# Patient Record
Sex: Female | Born: 1970 | Race: Black or African American | Hispanic: No | Marital: Single | Smoking: Never smoker
Health system: Southern US, Community
[De-identification: ages and names within clinical notes are randomized; demographics above are authoritative.]

## PROBLEM LIST (undated history)

## (undated) DIAGNOSIS — M5136 Other intervertebral disc degeneration, lumbar region: Secondary | ICD-10-CM

## (undated) DIAGNOSIS — C539 Malignant neoplasm of cervix uteri, unspecified: Secondary | ICD-10-CM

## (undated) DIAGNOSIS — F1910 Other psychoactive substance abuse, uncomplicated: Secondary | ICD-10-CM

## (undated) DIAGNOSIS — M549 Dorsalgia, unspecified: Secondary | ICD-10-CM

## (undated) DIAGNOSIS — I959 Hypotension, unspecified: Secondary | ICD-10-CM

## (undated) DIAGNOSIS — C801 Malignant (primary) neoplasm, unspecified: Secondary | ICD-10-CM

## (undated) DIAGNOSIS — M25562 Pain in left knee: Secondary | ICD-10-CM

## (undated) DIAGNOSIS — G8929 Other chronic pain: Secondary | ICD-10-CM

## (undated) DIAGNOSIS — M51369 Other intervertebral disc degeneration, lumbar region without mention of lumbar back pain or lower extremity pain: Secondary | ICD-10-CM

## (undated) DIAGNOSIS — F419 Anxiety disorder, unspecified: Secondary | ICD-10-CM

## (undated) DIAGNOSIS — N189 Chronic kidney disease, unspecified: Secondary | ICD-10-CM

## (undated) DIAGNOSIS — N183 Chronic kidney disease, stage 3 unspecified: Secondary | ICD-10-CM

## (undated) DIAGNOSIS — D649 Anemia, unspecified: Secondary | ICD-10-CM

## (undated) DIAGNOSIS — M5430 Sciatica, unspecified side: Secondary | ICD-10-CM

## (undated) DIAGNOSIS — G629 Polyneuropathy, unspecified: Secondary | ICD-10-CM

## (undated) DIAGNOSIS — R87629 Unspecified abnormal cytological findings in specimens from vagina: Secondary | ICD-10-CM

## (undated) DIAGNOSIS — G43909 Migraine, unspecified, not intractable, without status migrainosus: Secondary | ICD-10-CM

## (undated) DIAGNOSIS — K219 Gastro-esophageal reflux disease without esophagitis: Secondary | ICD-10-CM

## (undated) DIAGNOSIS — J45909 Unspecified asthma, uncomplicated: Secondary | ICD-10-CM

## (undated) HISTORY — DX: Other intervertebral disc degeneration, lumbar region: M51.36

## (undated) HISTORY — DX: Hypotension, unspecified: I95.9

## (undated) HISTORY — DX: Anemia, unspecified: D64.9

## (undated) HISTORY — DX: Unspecified abnormal cytological findings in specimens from vagina: R87.629

## (undated) HISTORY — PX: TUBAL LIGATION: SHX77

## (undated) HISTORY — DX: Other intervertebral disc degeneration, lumbar region without mention of lumbar back pain or lower extremity pain: M51.369

## (undated) HISTORY — DX: Chronic kidney disease, unspecified: N18.9

---

## 2004-05-15 ENCOUNTER — Emergency Department (HOSPITAL_COMMUNITY): Admission: EM | Admit: 2004-05-15 | Discharge: 2004-05-15 | Payer: Self-pay | Admitting: Emergency Medicine

## 2005-10-16 ENCOUNTER — Emergency Department (HOSPITAL_COMMUNITY): Admission: EM | Admit: 2005-10-16 | Discharge: 2005-10-16 | Payer: Self-pay | Admitting: Emergency Medicine

## 2006-07-23 ENCOUNTER — Emergency Department (HOSPITAL_COMMUNITY): Admission: EM | Admit: 2006-07-23 | Discharge: 2006-07-23 | Payer: Self-pay | Admitting: Emergency Medicine

## 2007-10-17 ENCOUNTER — Emergency Department (HOSPITAL_COMMUNITY): Admission: EM | Admit: 2007-10-17 | Discharge: 2007-10-18 | Payer: Self-pay | Admitting: Emergency Medicine

## 2008-02-20 ENCOUNTER — Emergency Department (HOSPITAL_COMMUNITY): Admission: EM | Admit: 2008-02-20 | Discharge: 2008-02-20 | Payer: Self-pay | Admitting: Emergency Medicine

## 2008-05-30 ENCOUNTER — Emergency Department (HOSPITAL_COMMUNITY): Admission: EM | Admit: 2008-05-30 | Discharge: 2008-05-30 | Payer: Self-pay | Admitting: Emergency Medicine

## 2009-04-17 ENCOUNTER — Emergency Department (HOSPITAL_COMMUNITY): Admission: EM | Admit: 2009-04-17 | Discharge: 2009-04-17 | Payer: Self-pay | Admitting: Emergency Medicine

## 2009-10-10 ENCOUNTER — Emergency Department (HOSPITAL_COMMUNITY): Admission: EM | Admit: 2009-10-10 | Discharge: 2009-10-10 | Payer: Self-pay | Admitting: Emergency Medicine

## 2009-10-11 ENCOUNTER — Emergency Department (HOSPITAL_COMMUNITY): Admission: EM | Admit: 2009-10-11 | Discharge: 2009-10-11 | Payer: Self-pay | Admitting: Emergency Medicine

## 2010-04-18 LAB — POCT I-STAT, CHEM 8
Chloride: 105 mEq/L (ref 96–112)
HCT: 36 % (ref 36.0–46.0)
Hemoglobin: 12.2 g/dL (ref 12.0–15.0)
Potassium: 3.4 mEq/L — ABNORMAL LOW (ref 3.5–5.1)
Sodium: 140 mEq/L (ref 135–145)

## 2010-05-20 LAB — URINALYSIS, ROUTINE W REFLEX MICROSCOPIC
Nitrite: NEGATIVE
Specific Gravity, Urine: 1.017 (ref 1.005–1.030)
Urobilinogen, UA: 0.2 mg/dL (ref 0.0–1.0)
pH: 6.5 (ref 5.0–8.0)

## 2010-05-20 LAB — DIFFERENTIAL
Basophils Absolute: 0 10*3/uL (ref 0.0–0.1)
Lymphocytes Relative: 14 % (ref 12–46)
Monocytes Absolute: 0.6 10*3/uL (ref 0.1–1.0)
Neutro Abs: 7 10*3/uL (ref 1.7–7.7)

## 2010-05-20 LAB — BASIC METABOLIC PANEL
CO2: 27 mEq/L (ref 19–32)
Calcium: 9.2 mg/dL (ref 8.4–10.5)
GFR calc Af Amer: >60 mL/min (ref >60)
GFR calc non Af Amer: >60 mL/min (ref >60)
Sodium: 137 mEq/L (ref 135–145)

## 2010-05-20 LAB — CBC
Hemoglobin: 12.6 g/dL (ref 12.0–15.0)
RDW: 14 % (ref 11.5–15.5)
WBC: 8.9 10*3/uL (ref 4.0–10.5)

## 2010-05-20 LAB — WET PREP, GENITAL: Yeast Wet Prep HPF POC: NONE SEEN

## 2010-07-25 ENCOUNTER — Emergency Department (HOSPITAL_COMMUNITY): Payer: Self-pay

## 2010-07-25 ENCOUNTER — Emergency Department (HOSPITAL_COMMUNITY)
Admission: EM | Admit: 2010-07-25 | Discharge: 2010-07-25 | Disposition: A | Discharge status: Home / Self Care | Payer: Self-pay | Attending: Emergency Medicine | Admitting: Emergency Medicine

## 2010-07-25 DIAGNOSIS — W2209XA Striking against other stationary object, initial encounter: Secondary | ICD-10-CM | POA: Insufficient documentation

## 2010-07-25 DIAGNOSIS — R51 Headache: Secondary | ICD-10-CM | POA: Insufficient documentation

## 2010-07-25 DIAGNOSIS — R112 Nausea with vomiting, unspecified: Secondary | ICD-10-CM | POA: Insufficient documentation

## 2010-07-25 DIAGNOSIS — Y9311 Activity, swimming: Secondary | ICD-10-CM | POA: Insufficient documentation

## 2010-07-25 DIAGNOSIS — R42 Dizziness and giddiness: Secondary | ICD-10-CM | POA: Insufficient documentation

## 2010-07-25 DIAGNOSIS — S060X9A Concussion with loss of consciousness of unspecified duration, initial encounter: Secondary | ICD-10-CM | POA: Insufficient documentation

## 2010-07-25 DIAGNOSIS — H538 Other visual disturbances: Secondary | ICD-10-CM | POA: Insufficient documentation

## 2010-07-25 DIAGNOSIS — M542 Cervicalgia: Secondary | ICD-10-CM | POA: Insufficient documentation

## 2010-08-30 ENCOUNTER — Emergency Department (HOSPITAL_COMMUNITY)
Admission: EM | Admit: 2010-08-30 | Discharge: 2010-08-30 | Disposition: A | Discharge status: Home / Self Care | Payer: Self-pay | Attending: Emergency Medicine | Admitting: Emergency Medicine

## 2010-08-30 DIAGNOSIS — M549 Dorsalgia, unspecified: Secondary | ICD-10-CM | POA: Insufficient documentation

## 2010-08-30 DIAGNOSIS — R252 Cramp and spasm: Secondary | ICD-10-CM | POA: Insufficient documentation

## 2010-08-30 DIAGNOSIS — IMO0001 Reserved for inherently not codable concepts without codable children: Secondary | ICD-10-CM | POA: Insufficient documentation

## 2010-08-30 LAB — BASIC METABOLIC PANEL
GFR calc Af Amer: >60 mL/min (ref >60)
GFR calc non Af Amer: 55 mL/min — ABNORMAL LOW (ref >60)
Glucose, Bld: 109 mg/dL — ABNORMAL HIGH (ref 70–99)
Potassium: 3.7 mEq/L (ref 3.5–5.1)
Sodium: 141 mEq/L (ref 135–145)

## 2010-08-30 LAB — CBC
HCT: 36.3 % (ref 36.0–46.0)
Hemoglobin: 11.7 g/dL — ABNORMAL LOW (ref 12.0–15.0)
MCH: 28.2 pg (ref 26.0–34.0)
MCHC: 32.2 g/dL (ref 30.0–36.0)
MCV: 87.5 fL (ref 78.0–100.0)
Platelets: 249 K/uL (ref 150–400)
RBC: 4.15 MIL/uL (ref 3.87–5.11)
RDW: 13.8 % (ref 11.5–15.5)
WBC: 6.2 K/uL (ref 4.0–10.5)

## 2010-08-30 LAB — D-DIMER, QUANTITATIVE: D-Dimer, Quant: 0.24 ug/mL-FEU (ref 0.00–0.48)

## 2010-08-30 MED ORDER — OXYCODONE-ACETAMINOPHEN 5-325 MG PO TABS: 2.0000 | ORAL_TABLET | ORAL | Status: AC | PRN

## 2010-08-30 MED ORDER — OXYCODONE-ACETAMINOPHEN 5-325 MG PO TABS
2.0000 | ORAL_TABLET | Freq: Once | ORAL | Status: AC
  Administered 2010-08-30: 2 via ORAL
  Filled 2010-08-30: qty 2

## 2010-08-30 NOTE — ED Notes (Signed)
Pt complain of pain and swelling in right leg x three days

## 2010-08-30 NOTE — ED Provider Notes (Signed)
History     Chief Complaint  Patient presents with  . Leg Pain   Patient is a 40 y.o. female presenting with leg pain. The history is provided by the patient. No language interpreter was used.  Leg Pain  Incident onset: 3 days ago. The incident occurred at home. There was no injury mechanism. Pain location: right posterior thigh. The quality of the pain is described as sharp. The pain is moderate. The pain has been constant since onset. Associated symptoms include muscle weakness. Pertinent negatives include no numbness, no loss of sensation and no tingling. She reports no foreign bodies present. The symptoms are aggravated by palpation. She has tried nothing for the symptoms.  C/o sharp pain to posterior aspect of right thigh with associated intermittent weakness of RLE onset 3 days ago and persistent since. States pain will intermittently radiate to right foot. Denies injury, fall, incontinence, dysuria. Reports h/o chronic back pain  Patient seen at 8:58AM  History reviewed. No pertinent past medical history.  History reviewed. No pertinent past surgical history.  History reviewed. No pertinent family history.  History  Substance Use Topics  . Smoking status: Never Smoker   . Smokeless tobacco: Not on file  . Alcohol Use: Yes    OB History    Grav Para Term Preterm Abortions TAB SAB Ect Mult Living                  Review of Systems  Constitutional: Negative for fever.  HENT: Negative for neck pain.   Gastrointestinal: Negative for nausea and vomiting.  Genitourinary: Negative for dysuria.       Negative for incontience  Musculoskeletal: Positive for myalgias and back pain.  Skin: Negative for rash.  Neurological: Negative for tingling and numbness.  All other systems reviewed and are negative.  All other systems negative except as noted in HPI.   Physical Exam  BP 120/68  Pulse 75  Temp(Src) 97.7 F (36.5 C) (Oral)  Resp 18  Ht 5\' 4"  (1.626 m)  Wt 234 lb  (106.142 kg)  BMI 40.17 kg/m2  SpO2 100%  LMP 08/04/2010  Physical Exam CONSTITUTIONAL: Well developed/well nourished HEAD AND FACE: Normocephalic/atraumatic EYES: EOMI/PERRL ENMT: Mucous membranes moist NECK: supple no meningeal signs SPINE:entire spine nontender CV: S1/S2 noted, no murmurs/rubs/gallops noted LUNGS: Lungs are clear to auscultation bilaterally, no apparent distress ABDOMEN: soft, nontender, no rebound or guarding NEURO: Pt is awake/alert, moves all extremitiesx4, no focal motor deficit noted in the LE EXTREMITIES: pulses normal, full ROM, DP and PT pulses intact, distal neurovascular intact, varicose veins to posterior right thigh without erythema or induration but with mild tenderness to palpation, mild swelling to right ankle but lower extremities otherwise appear symmetric, right calf mildly tender SKIN: warm, color normal PSYCH: no abnormalities of mood noted  ED Course  Procedures  MDM Nursing notes reviewed and considered in documentation All labs/vitals reviewed and considered - normal  Pt well appearing, no signs of DVT by labs and low risk.  No signs of cellulitis.  Admit back pain chronic, not new, no focal neuro deficits, she is able to ambulate Varicose veins do not appear secondarily infected   Chart written by Clarita Crane acting as scribe for Joya Gaskins, MD   I personally performed the services described in this documentation, which was scribed in my presence. The recorded information has been reviewed and considered. Joya Gaskins, MD     Joya Gaskins, MD 08/30/10 1028

## 2010-08-30 NOTE — ED Notes (Signed)
Complain of pain in right leg from varicose veins

## 2010-08-30 NOTE — ED Notes (Signed)
Pt states she feels better.

## 2010-08-30 NOTE — ED Notes (Signed)
Pt able to ambulate without assistance. States her leg does not hurt as much as it did but she still has some pain in the groin area

## 2010-10-25 ENCOUNTER — Emergency Department (HOSPITAL_COMMUNITY)
Admission: EM | Admit: 2010-10-25 | Discharge: 2010-10-25 | Disposition: A | Discharge status: Home / Self Care | Payer: Self-pay | Attending: Emergency Medicine | Admitting: Emergency Medicine

## 2010-10-25 ENCOUNTER — Emergency Department (HOSPITAL_COMMUNITY): Payer: Self-pay

## 2010-10-25 DIAGNOSIS — Y92009 Unspecified place in unspecified non-institutional (private) residence as the place of occurrence of the external cause: Secondary | ICD-10-CM | POA: Insufficient documentation

## 2010-10-25 DIAGNOSIS — W19XXXA Unspecified fall, initial encounter: Secondary | ICD-10-CM | POA: Insufficient documentation

## 2010-10-25 DIAGNOSIS — M79609 Pain in unspecified limb: Secondary | ICD-10-CM | POA: Insufficient documentation

## 2010-10-25 DIAGNOSIS — Y93K1 Activity, walking an animal: Secondary | ICD-10-CM | POA: Insufficient documentation

## 2010-10-25 DIAGNOSIS — S6390XA Sprain of unspecified part of unspecified wrist and hand, initial encounter: Secondary | ICD-10-CM | POA: Insufficient documentation

## 2011-03-11 ENCOUNTER — Encounter (HOSPITAL_COMMUNITY): Payer: Self-pay | Admitting: Family Medicine

## 2011-03-11 ENCOUNTER — Emergency Department (HOSPITAL_COMMUNITY)
Admission: EM | Admit: 2011-03-11 | Discharge: 2011-03-11 | Disposition: A | Discharge status: Home / Self Care | Payer: Self-pay | Attending: Emergency Medicine | Admitting: Emergency Medicine

## 2011-03-11 DIAGNOSIS — M5430 Sciatica, unspecified side: Secondary | ICD-10-CM | POA: Insufficient documentation

## 2011-03-11 DIAGNOSIS — M5380 Other specified dorsopathies, site unspecified: Secondary | ICD-10-CM | POA: Insufficient documentation

## 2011-03-11 DIAGNOSIS — M5432 Sciatica, left side: Secondary | ICD-10-CM

## 2011-03-11 DIAGNOSIS — M549 Dorsalgia, unspecified: Secondary | ICD-10-CM | POA: Insufficient documentation

## 2011-03-11 MED ORDER — DICLOFENAC SODIUM 75 MG PO TBEC: 75.0000 mg | DELAYED_RELEASE_TABLET | Freq: Two times a day (BID) | ORAL | Status: DC

## 2011-03-11 MED ORDER — HYDROCODONE-ACETAMINOPHEN 5-325 MG PO TABS
1.0000 | ORAL_TABLET | Freq: Four times a day (QID) | ORAL | Status: AC | PRN
Start: 2011-03-11 — End: 2011-03-21

## 2011-03-11 NOTE — ED Notes (Signed)
Pt reports having lower back pain x3 days. Denies any injury.

## 2011-03-11 NOTE — ED Provider Notes (Signed)
History     CSN: [id number]  Arrival date & time 03/11/11  4098   First MD Initiated Contact with Patient 03/11/11 0846      9:26 AM HPI Patient reports back pain that began gradually 3 days ago. States pain is located in the left lower back and radiates down left lateral thigh. Reports pain is worse with sitting and laying flat. Reports pain is better with standing upright. Reports no improvement with ibuprofen or muscle relaxers. Reports having to Vicodin left which helped the pain some. Denies known injury, numbness, tingling, weakness, Perineal numbness, saddle anesthesias, urinary symptoms, vomiting nausea, vomiting, fever  Patient is a 41 y.o. female presenting with back pain. The history is provided by the patient.  Back Pain  The current episode started more than 2 days ago. The problem occurs constantly. The problem has been gradually worsening. The pain is associated with no known injury. The pain is present in the lumbar spine. The quality of the pain is described as shooting. The pain radiates to the left thigh. The pain is moderate. The symptoms are aggravated by certain positions and bending. Pertinent negatives include no chest pain, no fever, no numbness, no headaches, no abdominal pain, no bowel incontinence, no perianal numbness, no bladder incontinence, no dysuria, no pelvic pain, no leg pain, no paresthesias, no paresis, no tingling and no weakness. She has tried NSAIDs, bed rest and muscle relaxants for the symptoms. The treatment provided no relief.    History reviewed. No pertinent past medical history.  History reviewed. No pertinent past surgical history.  History reviewed. No pertinent family history.  History  Substance Use Topics  . Smoking status: Never Smoker   . Smokeless tobacco: Not on file  . Alcohol Use: Yes    OB History    Grav Para Term Preterm Abortions TAB SAB Ect Mult Living                  Review of Systems  Constitutional: Negative for  fever and chills.  HENT: Negative for neck pain and neck stiffness.   Cardiovascular: Negative for chest pain.  Gastrointestinal: Negative for abdominal pain and bowel incontinence.  Genitourinary: Negative for bladder incontinence, dysuria, urgency, frequency, hematuria, flank pain and pelvic pain.  Musculoskeletal: Positive for back pain.       Denies saddle anesthesias, or perineal numbness, urinary or bowel incontinence  Neurological: Negative for tingling, weakness, numbness, headaches and paresthesias.    Allergies  Benadryl  Home Medications   Current Outpatient Rx  Name Route Sig Dispense Refill  . ACETAMINOPHEN 500 MG PO TABS Oral Take 1,000 mg by mouth every 6 (six) hours as needed. For pain    . HYDROCODONE-ACETAMINOPHEN 5-325 MG PO TABS Oral Take 1 tablet by mouth every 6 (six) hours as needed. For pain    . PRESCRIPTION MEDICATION Oral Take 1 tablet by mouth 4 (four) times daily as needed. Muscle relaxer for pain      BP 113/53  Pulse 83  Temp(Src) 98.4 F (36.9 C) (Oral)  Resp 18  SpO2 97%  LMP 02/24/2011  Physical Exam  Vitals reviewed. Constitutional: She is oriented to person, place, and time. Vital signs are normal. She appears well-developed and well-nourished.  HENT:  Head: Normocephalic and atraumatic.  Eyes: Conjunctivae are normal. Pupils are equal, round, and reactive to light.  Neck: Normal range of motion. Neck supple.  Cardiovascular: Normal rate, regular rhythm and normal heart sounds.  Exam reveals no friction rub.  No murmur heard. Pulmonary/Chest: Effort normal and breath sounds normal. She has no wheezes. She has no rhonchi. She has no rales. She exhibits no tenderness.  Abdominal: Soft. Bowel sounds are normal. She exhibits no distension and no mass. There is no tenderness. There is no rebound and no guarding.  Musculoskeletal: Normal range of motion.       Lumbar back: She exhibits tenderness, pain and spasm. She exhibits normal range of  motion, no bony tenderness, no swelling and normal pulse.       Back:  Neurological: She is alert and oriented to person, place, and time. Coordination normal.  Skin: Skin is warm and dry. No rash noted. No erythema. No pallor.    ED Course  Procedures  MDM  Patient has not had injury to lower back discussed treatment for sciatica type pain" improvement doctors conservative treatment to follow up with [facility] patient agrees with plan and is ready for discharge       Thomasene Lot, PA-C 03/11/11 1024

## 2011-03-11 NOTE — ED Provider Notes (Signed)
Medical screening examination/treatment/procedure(s) were performed by non-physician practitioner and as supervising physician I was immediately available for consultation/collaboration.   Lyanne Co, MD 03/11/11 1210

## 2011-06-12 ENCOUNTER — Emergency Department (HOSPITAL_COMMUNITY)
Admission: EM | Admit: 2011-06-12 | Discharge: 2011-06-12 | Disposition: A | Discharge status: Home / Self Care | Payer: Self-pay | Attending: Emergency Medicine | Admitting: Emergency Medicine

## 2011-06-12 ENCOUNTER — Encounter (HOSPITAL_COMMUNITY): Payer: Self-pay | Admitting: Emergency Medicine

## 2011-06-12 ENCOUNTER — Emergency Department (HOSPITAL_COMMUNITY): Payer: Self-pay

## 2011-06-12 DIAGNOSIS — S8000XA Contusion of unspecified knee, initial encounter: Secondary | ICD-10-CM | POA: Insufficient documentation

## 2011-06-12 DIAGNOSIS — W010XXA Fall on same level from slipping, tripping and stumbling without subsequent striking against object, initial encounter: Secondary | ICD-10-CM | POA: Insufficient documentation

## 2011-06-12 DIAGNOSIS — S8002XA Contusion of left knee, initial encounter: Secondary | ICD-10-CM

## 2011-06-12 MED ORDER — OXYCODONE-ACETAMINOPHEN 5-325 MG PO TABS: 1.0000 | ORAL_TABLET | ORAL | Status: AC | PRN

## 2011-06-12 MED ORDER — OXYCODONE-ACETAMINOPHEN 5-325 MG PO TABS
1.0000 | ORAL_TABLET | Freq: Once | ORAL | Status: AC
  Administered 2011-06-12: 1 via ORAL
  Filled 2011-06-12: qty 1

## 2011-06-12 NOTE — Discharge Instructions (Signed)
Contusion  A contusion is a deep bruise. Contusions happen when an injury causes bleeding under the skin. Signs of bruising include pain, puffiness (swelling), and discolored skin. The contusion may turn blue, purple, or yellow.  HOME CARE    Put ice on the injured area.   Put ice in a plastic bag.   Place a towel between your skin and the bag.   Leave the ice on for 15 to 20 minutes, 3 to 4 times a day.   Only take medicine as told by your doctor.   Rest the injured area.   If possible, raise (elevate) the injured area to lessen puffiness.  GET HELP RIGHT AWAY IF:    You have more bruising or puffiness.   You have pain that is getting worse.   Your puffiness or pain is not helped by medicine.  MAKE SURE YOU:    Understand these instructions.   Will watch your condition.   Will get help right away if you are not doing well or get worse.  Document Released: 07/09/2007 Document Revised: 01/09/2011 Document Reviewed: 11/25/2010  [employer] Patient Information 2012 [employer].

## 2011-06-12 NOTE — ED Notes (Signed)
Pt states she hit here knee x 2 weeks ago and had some swelling while on a car trip to [place]. Pt states she then tripped and fell last night and is having left knee pain and "pins and needles" feeling in left lower leg.
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
## 2011-08-22 ENCOUNTER — Emergency Department (HOSPITAL_COMMUNITY): Payer: Self-pay

## 2011-08-22 ENCOUNTER — Emergency Department (HOSPITAL_COMMUNITY)
Admission: EM | Admit: 2011-08-22 | Discharge: 2011-08-22 | Disposition: A | Discharge status: Home / Self Care | Payer: Self-pay | Attending: Emergency Medicine | Admitting: Emergency Medicine

## 2011-08-22 ENCOUNTER — Encounter (HOSPITAL_COMMUNITY): Payer: Self-pay | Admitting: *Deleted

## 2011-08-22 DIAGNOSIS — S8390XA Sprain of unspecified site of unspecified knee, initial encounter: Secondary | ICD-10-CM

## 2011-08-22 DIAGNOSIS — IMO0002 Reserved for concepts with insufficient information to code with codable children: Secondary | ICD-10-CM | POA: Insufficient documentation

## 2011-08-22 DIAGNOSIS — Y9229 Other specified public building as the place of occurrence of the external cause: Secondary | ICD-10-CM | POA: Insufficient documentation

## 2011-08-22 DIAGNOSIS — W010XXA Fall on same level from slipping, tripping and stumbling without subsequent striking against object, initial encounter: Secondary | ICD-10-CM | POA: Insufficient documentation

## 2011-08-22 MED ORDER — OXYCODONE-ACETAMINOPHEN 5-325 MG PO TABS
2.0000 | ORAL_TABLET | Freq: Once | ORAL | Status: AC
  Administered 2011-08-22: 2 via ORAL
  Filled 2011-08-22: qty 2

## 2011-08-22 MED ORDER — IBUPROFEN 800 MG PO TABS: 800.0000 mg | ORAL_TABLET | Freq: Three times a day (TID) | ORAL | Status: AC | PRN

## 2011-08-22 MED ORDER — OXYCODONE-ACETAMINOPHEN 5-325 MG PO TABS: 2.0000 | ORAL_TABLET | ORAL | Status: AC | PRN

## 2011-08-22 MED ORDER — IBUPROFEN 800 MG PO TABS
800.0000 mg | ORAL_TABLET | Freq: Once | ORAL | Status: AC
  Administered 2011-08-22: 800 mg via ORAL
  Filled 2011-08-22: qty 1

## 2011-08-22 NOTE — ED Notes (Signed)
PA  In room to evaluate

## 2011-08-22 NOTE — ED Provider Notes (Signed)
History     CSN: [id number]  Arrival date & time 08/22/11  1106   First MD Initiated Contact with Patient 08/22/11 1114      Chief Complaint  Patient presents with  . Knee Pain    (Consider location/radiation/quality/duration/timing/severity/associated sxs/prior treatment) HPI Comments: Kristi Martin tripped prior to arrival on a rolled at a local gas station landing on her right knee and has had increased pain with movement and weightbearing since the event.  She denies other injury including head injury or LOC.  Pain is constant and sharp and worse with palpation and range of motion.  She is currently applying ice which has reviewed her pain minimally.  She denies any previous problems with her left knee.  She has been unable to weight-bear since the event.  The history is provided by the patient.    History reviewed. No pertinent past medical history.  History reviewed. No pertinent past surgical history.  History reviewed. No pertinent family history.  History  Substance Use Topics  . Smoking status: Never Smoker   . Smokeless tobacco: Not on file  . Alcohol Use: Yes    OB History    Grav Para Term Preterm Abortions TAB SAB Ect Mult Living                  Review of Systems  Musculoskeletal: Positive for arthralgias. Negative for joint swelling.  Skin: Negative for wound.  Neurological: Negative for weakness and numbness.    Allergies  Benadryl  Home Medications   Current Outpatient Rx  Name Route Sig Dispense Refill  . IBUPROFEN 800 MG PO TABS Oral Take 1 tablet (800 mg total) by mouth every 8 (eight) hours as needed for pain. 15 tablet 0  . OXYCODONE-ACETAMINOPHEN 5-325 MG PO TABS Oral Take 2 tablets by mouth every 4 (four) hours as needed for pain. 20 tablet 0    BP 102/67  Pulse 102  Temp 98.2 F (36.8 C) (Oral)  Resp 20  Ht 5\' 4"  (1.626 m)  Wt 274 lb (124.286 kg)  BMI 47.03 kg/m2  SpO2 100%  LMP 08/09/2011  Physical Exam  Constitutional:  She appears well-developed and well-nourished.  HENT:  Head: Atraumatic.  Neck: Normal range of motion.  Cardiovascular:       Pulses equal bilaterally  Musculoskeletal: She exhibits tenderness. She exhibits no edema.       Left knee: tenderness found.       TTP let superior patellar edge,  No patellar tendon deformity. Pt can SLR keeping knee in extension,  But painful.  Neurological: She is alert. She has normal strength. She displays normal reflexes. No sensory deficit.       Equal strength  Skin: Skin is warm and dry.  Psychiatric: She has a normal mood and affect.    ED Course  Procedures (including critical care time)  Labs Reviewed - No data to display Dg Knee Complete 4 Views Left  08/22/2011  *RADIOLOGY REPORT*  Clinical Data: Fall, knee pain.  LEFT KNEE - COMPLETE 4+ VIEW  Comparison: 06/12/2011  Findings: No acute bony abnormality.  Specifically, no fracture, subluxation, or dislocation.  Soft tissues are intact.  No joint effusion.  IMPRESSION: No acute bony abnormality.  Original Report Authenticated By: Cyndie Chime, M.D.     1. Knee sprain       MDM  Oxycodone,  Ibuprofen,  RICE,  Ace wrap.  Referral Dr Romeo Apple if not improved over the next week.  Burgess Amor, PA 08/22/11 1254

## 2011-08-22 NOTE — ED Notes (Signed)
States she fell in a store and injured her left knee, states knee is painful to move

## 2011-08-22 NOTE — ED Notes (Signed)
Lt knee pain, tripped over rug and struck knee against counter , and floor

## 2011-08-26 NOTE — ED Provider Notes (Signed)
Medical screening examination/treatment/procedure(s) were performed by non-physician practitioner and as supervising physician I was immediately available for consultation/collaboration.   Shelda Jakes, MD 08/26/11 2155

## 2011-09-03 ENCOUNTER — Emergency Department (HOSPITAL_COMMUNITY)
Admission: EM | Admit: 2011-09-03 | Discharge: 2011-09-03 | Disposition: A | Discharge status: Home / Self Care | Payer: Self-pay | Attending: Emergency Medicine | Admitting: Emergency Medicine

## 2011-09-03 ENCOUNTER — Encounter (HOSPITAL_COMMUNITY): Payer: Self-pay | Admitting: Emergency Medicine

## 2011-09-03 DIAGNOSIS — M25562 Pain in left knee: Secondary | ICD-10-CM

## 2011-09-03 DIAGNOSIS — M25569 Pain in unspecified knee: Secondary | ICD-10-CM | POA: Insufficient documentation

## 2011-09-03 MED ORDER — ONDANSETRON HCL 4 MG PO TABS
4.0000 mg | ORAL_TABLET | Freq: Once | ORAL | Status: AC
  Administered 2011-09-03: 4 mg via ORAL
  Filled 2011-09-03: qty 1

## 2011-09-03 MED ORDER — ACETAMINOPHEN-CODEINE #3 300-30 MG PO TABS: 1.0000 | ORAL_TABLET | Freq: Four times a day (QID) | ORAL | Status: AC | PRN

## 2011-09-03 MED ORDER — DEXAMETHASONE 4 MG PO TABS: ORAL_TABLET | ORAL | Status: DC

## 2011-09-03 MED ORDER — MORPHINE SULFATE 4 MG/ML IJ SOLN: 8.0000 mg | Freq: Once | INTRAMUSCULAR | Status: DC

## 2011-09-03 MED ORDER — DEXAMETHASONE SODIUM PHOSPHATE 4 MG/ML IJ SOLN
8.0000 mg | Freq: Once | INTRAMUSCULAR | Status: AC
  Administered 2011-09-03: 8 mg via INTRAMUSCULAR
  Filled 2011-09-03: qty 2

## 2011-09-03 MED ORDER — MORPHINE SULFATE 2 MG/ML IJ SOLN
8.0000 mg | Freq: Once | INTRAMUSCULAR | Status: AC
  Administered 2011-09-03: 8 mg via INTRAMUSCULAR
  Filled 2011-09-03: qty 4

## 2011-09-03 NOTE — ED Notes (Signed)
Pt here on July 19 for l knee sprain. Ran out of rx meds x 2 days ago. Unable to go to ortho due to finances. Pain rating 9. Knee brace on and pt has crutches with her.

## 2011-09-03 NOTE — ED Provider Notes (Signed)
History     CSN: [id number]  Arrival date & time 09/03/11  8413   First MD Initiated Contact with Patient 09/03/11 [phone]      Chief Complaint  Patient presents with  . Knee Pain    (Consider location/radiation/quality/duration/timing/severity/associated sxs/prior treatment) Patient is a 41 y.o. female presenting with knee pain. The history is provided by the patient.  Knee Pain This is a recurrent problem. The current episode started in the past 7 days. The problem occurs constantly. The problem has been gradually worsening. Associated symptoms include arthralgias and joint swelling. Pertinent negatives include no abdominal pain, chest pain, coughing or neck pain. The symptoms are aggravated by standing and walking. She has tried nothing for the symptoms. The treatment provided no relief.    History reviewed. No pertinent past medical history.  History reviewed. No pertinent past surgical history.  History reviewed. No pertinent family history.  History  Substance Use Topics  . Smoking status: Never Smoker   . Smokeless tobacco: Not on file  . Alcohol Use: Yes    OB History    Grav Para Term Preterm Abortions TAB SAB Ect Mult Living                  Review of Systems  Constitutional: Negative for activity change.       All ROS Neg except as noted in HPI  HENT: Negative for nosebleeds and neck pain.   Eyes: Negative for photophobia and discharge.  Respiratory: Negative for cough, shortness of breath and wheezing.   Cardiovascular: Negative for chest pain and palpitations.  Gastrointestinal: Negative for abdominal pain and blood in stool.  Genitourinary: Negative for dysuria, frequency and hematuria.  Musculoskeletal: Positive for joint swelling and arthralgias. Negative for back pain.  Skin: Negative.   Neurological: Negative for dizziness, seizures and speech difficulty.  Psychiatric/Behavioral: Negative for hallucinations and confusion.    Allergies   Benadryl  Home Medications  No current outpatient prescriptions on file.  BP 145/97  Pulse 97  Temp 98.1 F (36.7 C) (Oral)  Resp 18  SpO2 95%  LMP 08/09/2011  Physical Exam  Nursing note and vitals reviewed. Constitutional: She is oriented to person, place, and time. She appears well-developed and well-nourished.  Non-toxic appearance.  HENT:  Head: Normocephalic.  Right Ear: Tympanic membrane and external ear normal.  Left Ear: Tympanic membrane and external ear normal.  Eyes: EOM and lids are normal. Pupils are equal, round, and reactive to light.  Neck: Normal range of motion. Neck supple. Carotid bruit is not present.  Cardiovascular: Normal rate, regular rhythm, normal heart sounds, intact distal pulses and normal pulses.   Pulmonary/Chest: Breath sounds normal. No respiratory distress.  Abdominal: Soft. Bowel sounds are normal. There is no tenderness. There is no guarding.  Musculoskeletal: Normal range of motion.       Pain to medial and posterior palpation. Pt will not allow any manipulation of the knee due to pain. Patella mid line.  Achilles intact. Distal pulses wnl. Neg Homan's sign.  Lymphadenopathy:       Head (right side): No submandibular adenopathy present.       Head (left side): No submandibular adenopathy present.    She has no cervical adenopathy.  Neurological: She is alert and oriented to person, place, and time. She has normal strength. No cranial nerve deficit or sensory deficit.  Skin: Skin is warm and dry.  Psychiatric: She has a normal mood and affect. Her speech is normal.  ED Course  Procedures (including critical care time)  Labs Reviewed - No data to display No results found.   No diagnosis found.    MDM  I have reviewed nursing notes, vital signs, and all appropriate lab and imaging results for this patient. Pt has been referred to orthopedic for her knee injury, but has not seen specialist due to financial issues. No acute  changes on limited exam today. Pt strongly encouraged to see the specialist as soon as possible. She will continue to use ace wrap and knee brace, and crutches. Rx for Tylenol# 3 (15 tabs given)       Kathie Dike, PA 09/20/11 [phone]

## 2011-09-21 NOTE — ED Provider Notes (Signed)
Medical screening examination/treatment/procedure(s) were performed by non-physician practitioner and as supervising physician I was immediately available for consultation/collaboration.   Joya Gaskins, MD 09/21/11 2232

## 2011-10-03 ENCOUNTER — Emergency Department (HOSPITAL_COMMUNITY)
Admission: EM | Admit: 2011-10-03 | Discharge: 2011-10-03 | Disposition: A | Discharge status: Home / Self Care | Payer: Self-pay | Attending: Emergency Medicine | Admitting: Emergency Medicine

## 2011-10-03 ENCOUNTER — Encounter (HOSPITAL_COMMUNITY): Payer: Self-pay | Admitting: *Deleted

## 2011-10-03 DIAGNOSIS — R0789 Other chest pain: Secondary | ICD-10-CM | POA: Insufficient documentation

## 2011-10-03 DIAGNOSIS — G43909 Migraine, unspecified, not intractable, without status migrainosus: Secondary | ICD-10-CM | POA: Insufficient documentation

## 2011-10-03 DIAGNOSIS — R42 Dizziness and giddiness: Secondary | ICD-10-CM | POA: Insufficient documentation

## 2011-10-03 HISTORY — DX: Pain in left knee: M25.562

## 2011-10-03 MED ORDER — SODIUM CHLORIDE 0.9 % IV SOLN
Freq: Once | INTRAVENOUS | Status: AC
  Administered 2011-10-03: 14:00:00 via INTRAVENOUS

## 2011-10-03 MED ORDER — DEXAMETHASONE SODIUM PHOSPHATE 4 MG/ML IJ SOLN
10.0000 mg | Freq: Once | INTRAMUSCULAR | Status: AC
  Administered 2011-10-03: 10 mg via INTRAVENOUS
  Filled 2011-10-03: qty 3

## 2011-10-03 MED ORDER — ACETAMINOPHEN-CODEINE #3 300-30 MG PO TABS: 1.0000 | ORAL_TABLET | Freq: Four times a day (QID) | ORAL | Status: AC | PRN

## 2011-10-03 MED ORDER — KETOROLAC TROMETHAMINE 30 MG/ML IJ SOLN
30.0000 mg | Freq: Once | INTRAMUSCULAR | Status: AC
  Administered 2011-10-03: 30 mg via INTRAVENOUS
  Filled 2011-10-03: qty 1

## 2011-10-03 MED ORDER — METOCLOPRAMIDE HCL 5 MG/ML IJ SOLN
10.0000 mg | Freq: Once | INTRAMUSCULAR | Status: AC
  Administered 2011-10-03: 10 mg via INTRAVENOUS
  Filled 2011-10-03: qty 2

## 2011-10-03 NOTE — ED Notes (Signed)
Patient with no complaints at this time. Respirations even and unlabored. Skin warm/dry. Discharge instructions reviewed with patient at this time. Patient given opportunity to voice concerns/ask questions. IV removed per policy and band-aid applied to site. Patient discharged at this time and left Emergency Department with steady gait.  

## 2011-10-03 NOTE — ED Notes (Signed)
Pt reports having a headache x 3 days,  Pain is worse today, +visual disturbances, denies any n/v, denies having a pmd. Had episode of pain that started at her left chest area and radiated up to shoulder today. Pain has now resolved.  Denies any sob.

## 2011-10-03 NOTE — ED Notes (Signed)
Pt given coke to drink, feeling better

## 2011-10-03 NOTE — ED Provider Notes (Cosign Needed)
History    This chart was scribed for Osvaldo Human, MD, MD by Smitty Pluck. The patient was seen in room APA05 and the patient's care was started at 1:38PM.   CSN: [id number]  Arrival date & time 10/03/11  1306   First MD Initiated Contact with Patient 10/03/11 1327      Chief Complaint  Patient presents with  . Headache    (Consider location/radiation/quality/duration/timing/severity/associated sxs/prior treatment) The history is provided by the patient.   Kristi Martin is a 41 y.o. female who presents to the Emergency Department complaining of constant, moderate headache onset 3 days ago. She reports sudden onset. Pt reports pain starts in occipital area of head and radiates to her eyes. She reports that her visual field gradually darkened until she could not see anything. She reports that episodes of visual disturbance lasts approximately 5 minutes. Denies hx of surgery. Pt denies smoking cigarettes and drinking alcohol. LMP was 09-08-11. Denies being bitten by ticks. Pt denies recent injury. Pt has family hx of migraines.   Pt does not have PCP. Pt is allergic to benadryl   History reviewed. No pertinent past medical history.  Past Surgical History  Procedure Date  . Tubal ligation     History reviewed. No pertinent family history.  History  Substance Use Topics  . Smoking status: Never Smoker   . Smokeless tobacco: Not on file  . Alcohol Use: No    OB History    Grav Para Term Preterm Abortions TAB SAB Ect Mult Living                  Review of Systems  Constitutional: Positive for chills. Negative for fever.  HENT: Positive for ear pain.   Eyes: Positive for photophobia and visual disturbance.  Respiratory: Positive for chest tightness. Negative for cough.   Gastrointestinal: Negative for vomiting and diarrhea.  Genitourinary: Negative for dysuria.  Skin: Negative for rash.  Neurological: Positive for dizziness. Negative for seizures and syncope.     Allergies  Benadryl  Home Medications   Current Outpatient Rx  Name Route Sig Dispense Refill  . DEXAMETHASONE 4 MG PO TABS  1 po daily 6 tablet 0    BP 117/86  Pulse 77  Temp 97.8 F (36.6 C) (Oral)  Resp 20  Ht 5\' 4"  (1.626 m)  Wt 250 lb (113.399 kg)  BMI 42.91 kg/m2  SpO2 100%  LMP 09/08/2011  Physical Exam  Nursing note and vitals reviewed. Constitutional: She is oriented to person, place, and time. She appears well-developed and well-nourished. No distress.  HENT:  Head: Normocephalic and atraumatic.  Right Ear: External ear normal.  Left Ear: External ear normal.  Mouth/Throat: Oropharynx is clear and moist.       No palpable tenderness   Eyes: Conjunctivae and EOM are normal. Pupils are equal, round, and reactive to light.       Markedly photophobic.  Neck: Normal range of motion. Neck supple.  Cardiovascular: Normal rate, regular rhythm and normal heart sounds.   Pulmonary/Chest: Effort normal and breath sounds normal. No respiratory distress. She has no wheezes.  Neurological: She is alert and oriented to person, place, and time. No cranial nerve deficit.       No sensory or motor deficit.  Skin: Skin is warm and dry.  Psychiatric: She has a normal mood and affect. Her behavior is normal.    ED Course  Procedures (including critical care time) DIAGNOSTIC STUDIES: Oxygen Saturation is  100% on room air, normal by my interpretation.    COORDINATION OF CARE: 1:45PM Ordered   Medications  metoCLOPramide (REGLAN) injection 10 mg (10 mg Intravenous Given 10/03/11 1357)  ketorolac (TORADOL) 30 MG/ML injection 30 mg (30 mg Intravenous Given 10/03/11 1357)  dexamethasone (DECADRON) injection 10 mg (10 mg Intravenous Given 10/03/11 1357)  0.9 %  sodium chloride infusion (  Intravenous New Bag/Given 10/03/11 1356)   2:47PM rechecked pt. Discusses post ED treatment. Pt is feeling better after medication. Pt is ready for discharge.     Date: 10/03/2011  Rate:  76  Rhythm: normal sinus rhythm  QRS Axis: normal  Intervals: normal  ST/T Wave abnormalities: normal  Conduction Disutrbances:none  Narrative Interpretation: Normal EKG.  Old EKG Reviewed: none available   2:48 PM Pt feels much better, is ready to go home.  Advised to rest in darkened room.  Rx for TC3 prn pain.   1. Migraine headache    I personally performed the services described in this documentation, which was scribed in my presence. The recorded information has been reviewed and considered.  Osvaldo Human, MD       Carleene Cooper III, MD 10/03/11 [phone]

## 2011-10-03 NOTE — ED Notes (Signed)
Headache, "shoots from back of my head , then I can't see for 5-6 minutes.  "  Has intermittent chest pain with pressure in chest at times.  No chest pain now.  Feels chilled at times.  Dizzy

## 2011-10-17 ENCOUNTER — Emergency Department (HOSPITAL_COMMUNITY)
Admission: EM | Admit: 2011-10-17 | Discharge: 2011-10-18 | Disposition: A | Discharge status: Home / Self Care | Payer: Self-pay | Attending: Emergency Medicine | Admitting: Emergency Medicine

## 2011-10-17 ENCOUNTER — Encounter (HOSPITAL_COMMUNITY): Payer: Self-pay | Admitting: *Deleted

## 2011-10-17 DIAGNOSIS — N949 Unspecified condition associated with female genital organs and menstrual cycle: Secondary | ICD-10-CM | POA: Insufficient documentation

## 2011-10-17 DIAGNOSIS — N938 Other specified abnormal uterine and vaginal bleeding: Secondary | ICD-10-CM | POA: Insufficient documentation

## 2011-10-17 HISTORY — DX: Migraine, unspecified, not intractable, without status migrainosus: G43.909

## 2011-10-17 LAB — POCT I-STAT, CHEM 8
Creatinine, Ser: 1.3 mg/dL — ABNORMAL HIGH (ref 0.50–1.10)
Glucose, Bld: 102 mg/dL — ABNORMAL HIGH (ref 70–99)
HCT: 38 % (ref 36.0–46.0)
Hemoglobin: 12.9 g/dL (ref 12.0–15.0)
Potassium: 3.8 mEq/L (ref 3.5–5.1)
Sodium: 140 mEq/L (ref 135–145)
TCO2: 24 mmol/L (ref 0–100)

## 2011-10-17 MED ORDER — MORPHINE SULFATE 4 MG/ML IJ SOLN
2.0000 mg | Freq: Once | INTRAMUSCULAR | Status: AC
  Administered 2011-10-17: 2 mg via INTRAVENOUS
  Filled 2011-10-17: qty 1

## 2011-10-17 MED ORDER — KETOROLAC TROMETHAMINE 30 MG/ML IJ SOLN
30.0000 mg | Freq: Once | INTRAMUSCULAR | Status: AC
  Administered 2011-10-17: 30 mg via INTRAVENOUS
  Filled 2011-10-17: qty 1

## 2011-10-17 MED ORDER — ONDANSETRON HCL 4 MG/2ML IJ SOLN
4.0000 mg | Freq: Once | INTRAMUSCULAR | Status: AC
  Administered 2011-10-17: 4 mg via INTRAVENOUS
  Filled 2011-10-17: qty 2

## 2011-10-17 NOTE — ED Notes (Signed)
Patient ambulated to restroom.

## 2011-10-17 NOTE — ED Provider Notes (Signed)
History     CSN: [id number]  Arrival date & time 10/17/11  2034   First MD Initiated Contact with Patient 10/17/11 2300      Chief Complaint  Patient presents with  . Vaginal Bleeding    (Consider location/radiation/quality/duration/timing/severity/associated sxs/prior treatment) HPI  Kristi Martin is a 41 y.o. female brought in by ambulance, who presents to the Emergency Department complaining of vaginal bleeding, abdominal cramping, passing clots that began yesterday. LMP was 10/09/11 and she began bleeding again last night. Cramping has been increasing. Denies fever, chills, nausea, vomiting, back pain.   Past Medical History  Diagnosis Date  . Left knee pain   . Migraine     Past Surgical History  Procedure Date  . Tubal ligation     History reviewed. No pertinent family history.  History  Substance Use Topics  . Smoking status: Never Smoker   . Smokeless tobacco: Not on file  . Alcohol Use: No    OB History    Grav Para Term Preterm Abortions TAB SAB Ect Mult Living                  Review of Systems  Constitutional: Negative for fever.       10 Systems reviewed and are negative for acute change except as noted in the HPI.  HENT: Negative for congestion.   Eyes: Negative for discharge and redness.  Respiratory: Negative for cough and shortness of breath.   Cardiovascular: Negative for chest pain.  Gastrointestinal: Negative for vomiting and abdominal pain.  Genitourinary: Positive for vaginal bleeding.  Musculoskeletal: Negative for back pain.  Skin: Negative for rash.  Neurological: Negative for syncope, numbness and headaches.  Psychiatric/Behavioral:       No behavior change.    Allergies  Benadryl  Home Medications  No current outpatient prescriptions on file.  BP 131/99  Pulse 88  Temp 99.1 F (37.3 C) (Oral)  Resp 22  Ht 5\' 4"  (1.626 m)  Wt 250 lb (113.399 kg)  BMI 42.91 kg/m2  SpO2 100%  LMP 10/09/2011  Physical Exam  Nursing  note and vitals reviewed. Constitutional:       Awake, alert, nontoxic appearance.  HENT:  Head: Atraumatic.  Eyes: Right eye exhibits no discharge. Left eye exhibits no discharge.  Neck: Neck supple.  Pulmonary/Chest: Effort normal. She exhibits no tenderness.  Abdominal: Soft. There is no tenderness. There is no rebound.  Genitourinary:       Vaginal bleeding with some clots.  Musculoskeletal: She exhibits no tenderness.       Baseline ROM, no obvious new focal weakness.  Neurological:       Mental status and motor strength appears baseline for patient and situation.  Skin: No rash noted.  Psychiatric: She has a normal mood and affect.    ED Course  Procedures (including critical care time) Results for orders placed during the hospital encounter of 10/17/11  POCT I-STAT, CHEM 8      Component Value Range   Sodium 140  135 - 145 mEq/L   Potassium 3.8  3.5 - 5.1 mEq/L   Chloride 103  96 - 112 mEq/L   BUN 6  6 - 23 mg/dL   Creatinine, Ser 7.84 (*) 0.50 - 1.10 mg/dL   Glucose, Bld 696 (*) 70 - 99 mg/dL   Calcium, Ion 2.95  2.84 - 1.23 mmol/L   TCO2 24  0 - 100 mmol/L   Hemoglobin 12.9  12.0 - 15.0 g/dL  HCT 38.0  36.0 - 46.0 %       MDM  Patient with dysfunctional uterine bleeding and abdominal cramping. Given analgesic, antiinflammatory and antiemetic. Referral to OB/GYN.  Pt feels improved after observation and/or treatment in ED.Pt stable in ED with no significant deterioration in condition.The patient appears reasonably screened and/or stabilized for discharge and I doubt any other medical condition or other [facility] requiring further screening, evaluation, or treatment in the ED at this time prior to discharge.  MDM Reviewed: nursing note and vitals Interpretation: labs           Nicoletta Dress. Colon Branch, MD 10/18/11 1610

## 2011-10-17 NOTE — ED Notes (Signed)
i stat results given to Dr. Colon Branch.

## 2011-10-17 NOTE — ED Notes (Signed)
Heavy vag bleeding, since yesterday with clots.

## 2011-10-18 LAB — URINALYSIS, ROUTINE W REFLEX MICROSCOPIC
Bilirubin Urine: NEGATIVE
Glucose, UA: NEGATIVE mg/dL
Ketones, ur: NEGATIVE mg/dL
Leukocytes, UA: NEGATIVE
Nitrite: NEGATIVE
Protein, ur: NEGATIVE mg/dL
Specific Gravity, Urine: 1.02 (ref 1.005–1.030)
Urobilinogen, UA: 0.2 mg/dL (ref 0.0–1.0)
pH: 6 (ref 5.0–8.0)

## 2011-10-18 LAB — URINE MICROSCOPIC-ADD ON

## 2011-10-18 MED ORDER — IBUPROFEN 800 MG PO TABS: 800.0000 mg | ORAL_TABLET | Freq: Three times a day (TID) | ORAL | Status: AC

## 2011-10-18 MED ORDER — HYDROCODONE-ACETAMINOPHEN 5-325 MG PO TABS: 1.0000 | ORAL_TABLET | ORAL | Status: AC | PRN

## 2011-10-18 NOTE — ED Notes (Signed)
Asked patient for third time if she had got in touch with a person to come pick her up. Patient reports she cannot get in touch with them. Contacted patient's significant other from the number she gave me and received no answer. Informed patient that she cannot drive home due to receiving morphine intravenously. Patient verbalized understanding.

## 2011-10-18 NOTE — ED Notes (Signed)
Patient waiting for ride to arrive.

## 2011-10-20 ENCOUNTER — Emergency Department (HOSPITAL_COMMUNITY)
Admission: EM | Admit: 2011-10-20 | Discharge: 2011-10-21 | Disposition: A | Discharge status: Home / Self Care | Payer: Self-pay | Attending: Emergency Medicine | Admitting: Emergency Medicine

## 2011-10-20 ENCOUNTER — Encounter (HOSPITAL_COMMUNITY): Payer: Self-pay | Admitting: *Deleted

## 2011-10-20 DIAGNOSIS — N83209 Unspecified ovarian cyst, unspecified side: Secondary | ICD-10-CM

## 2011-10-20 DIAGNOSIS — N760 Acute vaginitis: Secondary | ICD-10-CM | POA: Insufficient documentation

## 2011-10-20 DIAGNOSIS — A599 Trichomoniasis, unspecified: Secondary | ICD-10-CM

## 2011-10-20 DIAGNOSIS — K5900 Constipation, unspecified: Secondary | ICD-10-CM | POA: Insufficient documentation

## 2011-10-20 DIAGNOSIS — A64 Unspecified sexually transmitted disease: Secondary | ICD-10-CM

## 2011-10-20 DIAGNOSIS — B9689 Other specified bacterial agents as the cause of diseases classified elsewhere: Secondary | ICD-10-CM

## 2011-10-20 DIAGNOSIS — R112 Nausea with vomiting, unspecified: Secondary | ICD-10-CM | POA: Insufficient documentation

## 2011-10-20 DIAGNOSIS — N730 Acute parametritis and pelvic cellulitis: Secondary | ICD-10-CM

## 2011-10-20 DIAGNOSIS — R109 Unspecified abdominal pain: Secondary | ICD-10-CM | POA: Insufficient documentation

## 2011-10-20 DIAGNOSIS — A499 Bacterial infection, unspecified: Secondary | ICD-10-CM | POA: Insufficient documentation

## 2011-10-20 DIAGNOSIS — N949 Unspecified condition associated with female genital organs and menstrual cycle: Secondary | ICD-10-CM | POA: Insufficient documentation

## 2011-10-20 LAB — URINALYSIS, ROUTINE W REFLEX MICROSCOPIC
Nitrite: NEGATIVE
Specific Gravity, Urine: 1.037 — ABNORMAL HIGH (ref 1.005–1.030)
Urobilinogen, UA: 1 mg/dL (ref 0.0–1.0)

## 2011-10-20 LAB — URINE MICROSCOPIC-ADD ON

## 2011-10-20 LAB — PREGNANCY, URINE: Preg Test, Ur: NEGATIVE

## 2011-10-20 NOTE — ED Notes (Signed)
Pt c/o abdominal, vaginal pain, with nausea and vomiting. Pt was seen at [facility] Saturday for vaginal bleeding and pain. Pt reports bleeding has stopped but pain has gotten worse. Pt reports dysuria, last BM was 3 days ago, time span is not normal. Pt reports vomiting x 3 today. Vomit is small and clear

## 2011-10-21 ENCOUNTER — Emergency Department (HOSPITAL_COMMUNITY): Payer: Self-pay

## 2011-10-21 LAB — GC/CHLAMYDIA PROBE AMP, GENITAL
Chlamydia, DNA Probe: NEGATIVE
GC Probe Amp, Genital: NEGATIVE

## 2011-10-21 LAB — COMPREHENSIVE METABOLIC PANEL
Albumin: 2.8 g/dL — ABNORMAL LOW (ref 3.5–5.2)
Alkaline Phosphatase: 103 U/L (ref 39–117)
BUN: 12 mg/dL (ref 6–23)
Calcium: 9.1 mg/dL (ref 8.4–10.5)
Creatinine, Ser: 1.19 mg/dL — ABNORMAL HIGH (ref 0.50–1.10)
GFR calc Af Amer: 64 mL/min — ABNORMAL LOW (ref >90)
Glucose, Bld: 113 mg/dL — ABNORMAL HIGH (ref 70–99)
Total Protein: 7.4 g/dL (ref 6.0–8.3)

## 2011-10-21 LAB — CBC WITH DIFFERENTIAL/PLATELET
Basophils Relative: 0 % (ref 0–1)
Eosinophils Absolute: 0.1 10*3/uL (ref 0.0–0.7)
Eosinophils Relative: 1 % (ref 0–5)
Hemoglobin: 10.8 g/dL — ABNORMAL LOW (ref 12.0–15.0)
Lymphs Abs: 1.9 10*3/uL (ref 0.7–4.0)
MCH: 27.7 pg (ref 26.0–34.0)
MCHC: 33 g/dL (ref 30.0–36.0)
MCV: 83.8 fL (ref 78.0–100.0)
Monocytes Absolute: 0.9 10*3/uL (ref 0.1–1.0)
Monocytes Relative: 8 % (ref 3–12)
RBC: 3.9 MIL/uL (ref 3.87–5.11)

## 2011-10-21 LAB — WET PREP, GENITAL

## 2011-10-21 MED ORDER — ONDANSETRON HCL 4 MG/2ML IJ SOLN
4.0000 mg | Freq: Once | INTRAMUSCULAR | Status: AC
  Administered 2011-10-21: 4 mg via INTRAVENOUS
  Filled 2011-10-21: qty 2

## 2011-10-21 MED ORDER — CEFTRIAXONE SODIUM 250 MG IJ SOLR
250.0000 mg | Freq: Once | INTRAMUSCULAR | Status: AC
  Administered 2011-10-21: 250 mg via INTRAMUSCULAR
  Filled 2011-10-21: qty 250

## 2011-10-21 MED ORDER — AZITHROMYCIN 250 MG PO TABS
1000.0000 mg | ORAL_TABLET | Freq: Once | ORAL | Status: AC
  Administered 2011-10-21: 1000 mg via ORAL
  Filled 2011-10-21: qty 4

## 2011-10-21 MED ORDER — METRONIDAZOLE 500 MG PO TABS: 500.0000 mg | ORAL_TABLET | Freq: Two times a day (BID) | ORAL | Status: DC

## 2011-10-21 MED ORDER — MORPHINE SULFATE 4 MG/ML IJ SOLN
4.0000 mg | Freq: Once | INTRAMUSCULAR | Status: AC
  Administered 2011-10-21: 4 mg via INTRAVENOUS
  Filled 2011-10-21: qty 1

## 2011-10-21 MED ORDER — METRONIDAZOLE 500 MG PO TABS
2000.0000 mg | ORAL_TABLET | Freq: Once | ORAL | Status: AC
  Administered 2011-10-21: 2000 mg via ORAL
  Filled 2011-10-21: qty 1

## 2011-10-21 MED ORDER — DOXYCYCLINE HYCLATE 100 MG PO CAPS: 100.0000 mg | ORAL_CAPSULE | Freq: Two times a day (BID) | ORAL | Status: DC

## 2011-10-21 MED ORDER — HYDROCODONE-ACETAMINOPHEN 5-325 MG PO TABS: 1.0000 | ORAL_TABLET | ORAL | Status: DC | PRN

## 2011-10-21 MED ORDER — OXYCODONE-ACETAMINOPHEN 5-325 MG PO TABS: 1.0000 | ORAL_TABLET | Freq: Four times a day (QID) | ORAL | Status: DC | PRN

## 2011-10-21 NOTE — ED Provider Notes (Signed)
Medical screening examination/treatment/procedure(s) were performed by non-physician practitioner and as supervising physician I was immediately available for consultation/collaboration.  Duha Abair M Breniyah Romm, MD 10/21/11 0821 

## 2011-10-21 NOTE — ED Notes (Signed)
awaitimg pelvic exam. Pelvic cart ready at bedside

## 2011-10-21 NOTE — ED Provider Notes (Signed)
History     CSN: [id number]  Arrival date & time 10/20/11  2102   First MD Initiated Contact with Patient 10/21/11 0259      Chief Complaint  Patient presents with  . Abdominal Pain  . Nausea  . Emesis  . Vaginal Pain   HPI  History provided by the patient. Patient is a 41 year old female with no significant PMH who presents with complaints of lower abdomen and pelvic discomfort and pains. Patient states that she has also had some abnormal vaginal bleeding and discharge. She was seen a few days ago any penetration she wrote him for abnormal bleeding and was told to followup with an OB/GYN provider. Patient states she had no other diagnosis or evaluation. Pain has continued. It has a waxing and waning pain described as severe. Patient also reports some associated constipation without regular bowel movement for 2-3 days. Symptoms have also been associated with nausea and vomiting. She denies fever, chills or sweats. She denies similar symptoms previously. Last normal menstrual period was at the beginning of the month.    Past Medical History  Diagnosis Date  . Left knee pain   . Migraine     Past Surgical History  Procedure Date  . Tubal ligation     History reviewed. No pertinent family history.  History  Substance Use Topics  . Smoking status: Never Smoker   . Smokeless tobacco: Not on file  . Alcohol Use: No    OB History    Grav Para Term Preterm Abortions TAB SAB Ect Mult Living                  Review of Systems  Constitutional: Negative for fever and chills.  Gastrointestinal: Positive for vomiting and abdominal pain.  Genitourinary: Positive for vaginal bleeding, vaginal discharge and pelvic pain. Negative for dysuria, frequency and flank pain.    Allergies  Benadryl and Onion  Home Medications   Current Outpatient Rx  Name Route Sig Dispense Refill  . HYDROCODONE-ACETAMINOPHEN 5-325 MG PO TABS Oral Take 1 tablet by mouth every 4 (four) hours as  needed for pain. 10 tablet 0  . IBUPROFEN 800 MG PO TABS Oral Take 1 tablet (800 mg total) by mouth 3 (three) times daily. 21 tablet 0    BP 117/81  Pulse 103  Temp 98.4 F (36.9 C) (Oral)  Resp 22  SpO2 99%  LMP 10/09/2011  Physical Exam  Nursing note and vitals reviewed. Constitutional: She is oriented to person, place, and time. She appears well-developed and well-nourished. No distress.  HENT:  Head: Normocephalic.  Cardiovascular: Normal rate and regular rhythm.   Pulmonary/Chest: Effort normal and breath sounds normal. No respiratory distress. She has no wheezes. She has no rales.  Abdominal: Soft. There is tenderness in the right lower quadrant, suprapubic area and left lower quadrant. There is no rebound, no guarding, no CVA tenderness, no tenderness at McBurney's point and negative Murphy's sign.  Genitourinary:       Chaperone was present. Large amounts of vaginal discharge. Patient with CMT. Clinical signs concerning for PID.  Neurological: She is alert and oriented to person, place, and time.  Skin: Skin is warm and dry. No rash noted.  Psychiatric: She has a normal mood and affect. Her behavior is normal.    ED Course  Procedures   Results for orders placed during the hospital encounter of 10/20/11  CBC WITH DIFFERENTIAL      Component Value Range   WBC  11.4 (*) 4.0 - 10.5 K/uL   RBC 3.90  3.87 - 5.11 MIL/uL   Hemoglobin 10.8 (*) 12.0 - 15.0 g/dL   HCT 47.8 (*) 29.5 - 62.1 %   MCV 83.8  78.0 - 100.0 fL   MCH 27.7  26.0 - 34.0 pg   MCHC 33.0  30.0 - 36.0 g/dL   RDW 30.8  65.7 - 84.6 %   Platelets 329  150 - 400 K/uL   Neutrophils Relative 74  43 - 77 %   Neutro Abs 8.4 (*) 1.7 - 7.7 K/uL   Lymphocytes Relative 17  12 - 46 %   Lymphs Abs 1.9  0.7 - 4.0 K/uL   Monocytes Relative 8  3 - 12 %   Monocytes Absolute 0.9  0.1 - 1.0 K/uL   Eosinophils Relative 1  0 - 5 %   Eosinophils Absolute 0.1  0.0 - 0.7 K/uL   Basophils Relative 0  0 - 1 %   Basophils  Absolute 0.0  0.0 - 0.1 K/uL  COMPREHENSIVE METABOLIC PANEL      Component Value Range   Sodium 137  135 - 145 mEq/L   Potassium 3.4 (*) 3.5 - 5.1 mEq/L   Chloride 101  96 - 112 mEq/L   CO2 27  19 - 32 mEq/L   Glucose, Bld 113 (*) 70 - 99 mg/dL   BUN 12  6 - 23 mg/dL   Creatinine, Ser 9.62 (*) 0.50 - 1.10 mg/dL   Calcium 9.1  8.4 - 95.2 mg/dL   Total Protein 7.4  6.0 - 8.3 g/dL   Albumin 2.8 (*) 3.5 - 5.2 g/dL   AST 18  0 - 37 U/L   ALT 21  0 - 35 U/L   Alkaline Phosphatase 103  39 - 117 U/L   Total Bilirubin 0.2 (*) 0.3 - 1.2 mg/dL   GFR calc non Af Amer 55 (*) >90 mL/min   GFR calc Af Amer 64 (*) >90 mL/min  PREGNANCY, URINE      Component Value Range   Preg Test, Ur NEGATIVE  NEGATIVE  URINALYSIS, ROUTINE W REFLEX MICROSCOPIC      Component Value Range   Color, Urine AMBER (*) YELLOW   APPearance CLOUDY (*) CLEAR   Specific Gravity, Urine 1.037 (*) 1.005 - 1.030   pH 6.0  5.0 - 8.0   Glucose, UA NEGATIVE  NEGATIVE mg/dL   Hgb urine dipstick LARGE (*) NEGATIVE   Bilirubin Urine SMALL (*) NEGATIVE   Ketones, ur TRACE (*) NEGATIVE mg/dL   Protein, ur 30 (*) NEGATIVE mg/dL   Urobilinogen, UA 1.0  0.0 - 1.0 mg/dL   Nitrite NEGATIVE  NEGATIVE   Leukocytes, UA SMALL (*) NEGATIVE  URINE MICROSCOPIC-ADD ON      Component Value Range   Squamous Epithelial / LPF MANY (*) RARE   WBC, UA 3-6  <3 WBC/hpf   RBC / HPF 0-2  <3 RBC/hpf   Bacteria, UA MANY (*) RARE   Urine-Other MUCOUS PRESENT    WET PREP, GENITAL      Component Value Range   Yeast Wet Prep HPF POC NONE SEEN  NONE SEEN   Trich, Wet Prep FEW (*) NONE SEEN   Clue Cells Wet Prep HPF POC TOO NUMEROUS TO COUNT (*) NONE SEEN   WBC, Wet Prep HPF POC MANY (*) NONE SEEN       US Transvaginal Non-ob  10/21/2011  *RADIOLOGY REPORT*  Clinical Data: Right pelvic pain  for 3 days.  TRANSABDOMINAL AND TRANSVAGINAL ULTRASOUND OF PELVIS Technique:  Both transabdominal and transvaginal ultrasound examinations of the pelvis were  performed. Transabdominal technique was performed for global imaging of the pelvis including uterus, ovaries, adnexal regions, and pelvic cul-de-sac.  It was necessary to proceed with endovaginal exam following the transabdominal exam to visualize the ovaries.  Comparison:  None  Findings:  Uterus: Normal in size and appearance.  Uterus was retroflexed.  Endometrium: Normal in thickness and appearance  Right ovary:  Measures 5.5 x 4.0 x 3.3 cm.  There is a small cystic lesion measuring 2.0 x 2.5 x 2.5 cm with internal echoes present. No mural nodule or abnormality on Doppler imaging is identified.  Left ovary: Normal appearance/no adnexal mass  Other findings: Trace amount of free pelvic fluid noted.  IMPRESSION: Small right ovarian cyst with internal echoes likely representing a hemorrhagic cyst.  The examination is otherwise unremarkable.   Original Report Authenticated By: Bernadene Bell. Maricela Curet, M.D.    US Pelvis Complete  10/21/2011  *RADIOLOGY REPORT*  Clinical Data: Right pelvic pain for 3 days.  TRANSABDOMINAL AND TRANSVAGINAL ULTRASOUND OF PELVIS Technique:  Both transabdominal and transvaginal ultrasound examinations of the pelvis were performed. Transabdominal technique was performed for global imaging of the pelvis including uterus, ovaries, adnexal regions, and pelvic cul-de-sac.  It was necessary to proceed with endovaginal exam following the transabdominal exam to visualize the ovaries.  Comparison:  None  Findings:  Uterus: Normal in size and appearance.  Uterus was retroflexed.  Endometrium: Normal in thickness and appearance  Right ovary:  Measures 5.5 x 4.0 x 3.3 cm.  There is a small cystic lesion measuring 2.0 x 2.5 x 2.5 cm with internal echoes present. No mural nodule or abnormality on Doppler imaging is identified.  Left ovary: Normal appearance/no adnexal mass  Other findings: Trace amount of free pelvic fluid noted.  IMPRESSION: Small right ovarian cyst with internal echoes likely  representing a hemorrhagic cyst.  The examination is otherwise unremarkable.   Original Report Authenticated By: Bernadene Bell. D'ALESSIO, M.D.      1. Trichomonas   2. STD (female)   3. Bacterial vaginosis   4. PID (acute pelvic inflammatory disease)   5. Ovarian cyst       MDM  Patient seen and evaluated. Patient no acute distress but does appear uncomfortable.   Discussed with patient findings a positive trichomonas an STD. Patient advised not to have intercourse until she has her test results back and informed all her partners of positive testing and he received treatment.     Angus Seller, [place] 10/21/11 [phone]

## 2011-12-12 ENCOUNTER — Emergency Department (HOSPITAL_COMMUNITY)
Admission: EM | Admit: 2011-12-12 | Discharge: 2011-12-13 | Disposition: A | Discharge status: Home / Self Care | Payer: Self-pay | Attending: Emergency Medicine | Admitting: Emergency Medicine

## 2011-12-12 ENCOUNTER — Encounter (HOSPITAL_COMMUNITY): Payer: Self-pay | Admitting: Emergency Medicine

## 2011-12-12 ENCOUNTER — Emergency Department (HOSPITAL_COMMUNITY): Payer: Self-pay

## 2011-12-12 DIAGNOSIS — J40 Bronchitis, not specified as acute or chronic: Secondary | ICD-10-CM | POA: Insufficient documentation

## 2011-12-12 DIAGNOSIS — R0602 Shortness of breath: Secondary | ICD-10-CM | POA: Insufficient documentation

## 2011-12-12 DIAGNOSIS — G43909 Migraine, unspecified, not intractable, without status migrainosus: Secondary | ICD-10-CM | POA: Insufficient documentation

## 2011-12-12 DIAGNOSIS — R091 Pleurisy: Secondary | ICD-10-CM | POA: Insufficient documentation

## 2011-12-12 LAB — CBC
HCT: 36.6 % (ref 36.0–46.0)
MCHC: 32.2 g/dL (ref 30.0–36.0)
Platelets: 335 10*3/uL (ref 150–400)
RDW: 15 % (ref 11.5–15.5)
WBC: 6.7 10*3/uL (ref 4.0–10.5)

## 2011-12-12 LAB — BASIC METABOLIC PANEL
BUN: 10 mg/dL (ref 6–23)
GFR calc Af Amer: 64 mL/min — ABNORMAL LOW (ref >90)
GFR calc non Af Amer: 56 mL/min — ABNORMAL LOW (ref >90)
Potassium: 3.9 mEq/L (ref 3.5–5.1)

## 2011-12-12 LAB — POCT I-STAT TROPONIN I

## 2011-12-12 LAB — PRO B NATRIURETIC PEPTIDE: Pro B Natriuretic peptide (BNP): <5 pg/mL (ref 0–125)

## 2011-12-12 MED ORDER — NITROGLYCERIN 0.4 MG SL SUBL
0.4000 mg | SUBLINGUAL_TABLET | SUBLINGUAL | Status: DC | PRN
  Administered 2011-12-12: 0.4 mg via SUBLINGUAL
  Filled 2011-12-12: qty 25

## 2011-12-12 MED ORDER — ASPIRIN 81 MG PO CHEW
324.0000 mg | CHEWABLE_TABLET | Freq: Once | ORAL | Status: AC
  Administered 2011-12-12: 324 mg via ORAL
  Filled 2011-12-12: qty 4

## 2011-12-12 MED ORDER — GI COCKTAIL ~~LOC~~
30.0000 mL | Freq: Once | ORAL | Status: AC
  Administered 2011-12-12: 30 mL via ORAL
  Filled 2011-12-12: qty 30

## 2011-12-12 MED ORDER — KETOROLAC TROMETHAMINE 30 MG/ML IJ SOLN
30.0000 mg | Freq: Once | INTRAMUSCULAR | Status: AC
  Administered 2011-12-12: 30 mg via INTRAMUSCULAR

## 2011-12-12 MED ORDER — KETOROLAC TROMETHAMINE 30 MG/ML IJ SOLN
30.0000 mg | Freq: Once | INTRAMUSCULAR | Status: DC
  Filled 2011-12-12: qty 1

## 2011-12-12 NOTE — ED Notes (Signed)
Patient states chest pain "feels like someone is sitting on my chest" began @ 2 days ago. Today patient states she began having difficulty breathing when laying down. Patient states as long as she is sitting she feels ok. Patient placed on 2L [place] O2.

## 2011-12-12 NOTE — ED Provider Notes (Signed)
History     CSN: [id number]  Arrival date & time 12/12/11  1750   First MD Initiated Contact with Patient 12/12/11 2303      Chief Complaint  Patient presents with  . Chest Pain  . Shortness of Breath    (Consider location/radiation/quality/duration/timing/severity/associated sxs/prior treatment) Patient is a 41 y.o. female presenting with chest pain and shortness of breath. The history is provided by the patient. No language interpreter was used.  Chest Pain The chest pain began yesterday. Duration of episode(s) is 36 hours. Chest pain occurs constantly. The chest pain is unchanged. The pain is associated with breathing. The severity of the pain is severe. The quality of the pain is described as dull. The pain does not radiate. Chest pain is worsened by deep breathing. Primary symptoms include shortness of breath. Pertinent negatives for primary symptoms include no fever, no fatigue, no syncope, no cough, no wheezing, no palpitations, no nausea and no vomiting.  Pertinent negatives for associated symptoms include no claudication and no lower extremity edema. She tried nothing for the symptoms. Risk factors include obesity.  Pertinent negatives for past medical history include no MI.  Procedure history is negative for cardiac catheterization.    Shortness of Breath  Associated symptoms include chest pain and shortness of breath. Pertinent negatives include no fever, no cough and no wheezing.    Past Medical History  Diagnosis Date  . Left knee pain   . Migraine     Past Surgical History  Procedure Date  . Tubal ligation     No family history on file.  History  Substance Use Topics  . Smoking status: Never Smoker   . Smokeless tobacco: Not on file  . Alcohol Use: No    OB History    Grav Para Term Preterm Abortions TAB SAB Ect Mult Living                  Review of Systems  Constitutional: Negative for fever and fatigue.  HENT: Negative for neck pain and neck  stiffness.   Respiratory: Positive for shortness of breath. Negative for cough and wheezing.   Cardiovascular: Positive for chest pain. Negative for palpitations, claudication and syncope.  Gastrointestinal: Negative for nausea and vomiting.  All other systems reviewed and are negative.    Allergies  Benadryl and Onion  Home Medications  No current outpatient prescriptions on file.  BP 111/90  Pulse 91  Temp 98.4 F (36.9 C) (Oral)  Resp 16  Ht 5\' 4"  (1.626 m)  Wt 250 lb (113.399 kg)  BMI 42.91 kg/m2  SpO2 97%  LMP 11/12/2011  Physical Exam  Constitutional: She is oriented to person, place, and time. She appears well-developed and well-nourished. No distress.  HENT:  Head: Normocephalic and atraumatic.  Mouth/Throat: Oropharynx is clear and moist.  Eyes: Conjunctivae normal and EOM are normal.  Neck: Normal range of motion. Neck supple.  Cardiovascular: Normal rate and regular rhythm.   Pulmonary/Chest: Effort normal and breath sounds normal. She has no wheezes. She has no rales. She exhibits tenderness.  Abdominal: Soft. Bowel sounds are normal. There is no tenderness. There is no rebound and no guarding.  Musculoskeletal: Normal range of motion.  Neurological: She is alert and oriented to person, place, and time.  Skin: Skin is warm and dry. She is not diaphoretic.  Psychiatric: She has a normal mood and affect.    ED Course  Procedures (including critical care time)  Labs Reviewed  CBC -  Abnormal; Notable for the following:    Hemoglobin 11.8 (*)     All other components within normal limits  BASIC METABOLIC PANEL - Abnormal; Notable for the following:    Glucose, Bld 106 (*)     Creatinine, Ser 1.18 (*)     GFR calc non Af Amer 56 (*)     GFR calc Af Amer 64 (*)     All other components within normal limits  PRO B NATRIURETIC PEPTIDE  POCT I-STAT TROPONIN I  POCT PREGNANCY, URINE  D-DIMER, QUANTITATIVE   Dg Chest 2 View  12/12/2011  *RADIOLOGY REPORT*   Clinical Data: Chest pain, shortness of breath  CHEST - 2 VIEW  Comparison: None.  Findings: Cardiomediastinal silhouette is unremarkable.  No pulmonary edema.  Bony thorax is unremarkable.  There is streaky left basilar atelectasis or infiltrate.  IMPRESSION: No pulmonary edema.  Streaky left basilar atelectasis or infiltrate.   Original Report Authenticated By: Natasha Mead, M.D.      No diagnosis found.    MDM   Date: 12/12/2011  Rate:88  Rhythm: normal sinus rhythm  QRS Axis: normal  Intervals: normal  ST/T Wave abnormalities: normal  Conduction Disutrbances: none  Narrative Interpretation: unremarkable    Will treat for bronchitis and pleurisy.  Return immediately for chest pain shortness of breath or any concerns.  Patient verbalizes understanding and agrees to follow up.  2 negative troponins and a negative EKG is sufficient to exclude ACS in this patient who is low risk and have a very low suspicion        Lily Velasquez K Evelette Hollern-Rasch, MD 12/13/11 [phone]

## 2011-12-12 NOTE — ED Notes (Signed)
Pt c/o pain to chest since last night-"tight, pressure"  States she has been short of breath when she lies down or sitting back x last 4 days or so.    Denies any other symptoms except for diarrhea.  States she thougth it was heart burn so has been drinking liquid antacid "like water" and feels the diarrhea is from that.  Pt is warm, dry and pink in triage.  Speaking full sentences but states it is hard to breathe when the head of the stretcher is lowered.

## 2011-12-13 LAB — POCT I-STAT TROPONIN I

## 2011-12-13 MED ORDER — IBUPROFEN 600 MG PO TABS: 600.0000 mg | ORAL_TABLET | Freq: Four times a day (QID) | ORAL | Status: DC | PRN

## 2011-12-13 MED ORDER — AZITHROMYCIN 250 MG PO TABS: ORAL_TABLET | ORAL | Status: DC

## 2011-12-13 MED ORDER — TRAMADOL HCL 50 MG PO TABS: 50.0000 mg | ORAL_TABLET | Freq: Four times a day (QID) | ORAL | Status: DC | PRN

## 2011-12-13 NOTE — ED Notes (Signed)
Discharge instructions reviewed. All questions answered. Rx given x3.  

## 2011-12-21 ENCOUNTER — Emergency Department (HOSPITAL_COMMUNITY)
Admission: EM | Admit: 2011-12-21 | Discharge: 2011-12-21 | Disposition: A | Discharge status: Home / Self Care | Payer: Self-pay | Attending: Emergency Medicine | Admitting: Emergency Medicine

## 2011-12-21 ENCOUNTER — Encounter (HOSPITAL_COMMUNITY): Payer: Self-pay | Admitting: Emergency Medicine

## 2011-12-21 ENCOUNTER — Emergency Department (HOSPITAL_COMMUNITY): Payer: Self-pay

## 2011-12-21 DIAGNOSIS — Z8669 Personal history of other diseases of the nervous system and sense organs: Secondary | ICD-10-CM | POA: Insufficient documentation

## 2011-12-21 DIAGNOSIS — G5600 Carpal tunnel syndrome, unspecified upper limb: Secondary | ICD-10-CM | POA: Insufficient documentation

## 2011-12-21 MED ORDER — IBUPROFEN 800 MG PO TABS: 800.0000 mg | ORAL_TABLET | Freq: Three times a day (TID) | ORAL | Status: DC

## 2011-12-21 MED ORDER — HYDROCODONE-ACETAMINOPHEN 5-325 MG PO TABS: ORAL_TABLET | ORAL | Status: DC

## 2011-12-21 NOTE — ED Provider Notes (Signed)
History     CSN: [id number]  Arrival date & time 12/21/11  1409   First MD Initiated Contact with Patient 12/21/11 1512      Chief Complaint  Patient presents with  . Hand Pain    (Consider location/radiation/quality/duration/timing/severity/associated sxs/prior treatment) HPI Comments: Patient c/o pain and intermittent numbness and tingling to her left hand for 2 days.  States the pain to her wrist radiates into her hand and causes numbness to her fourth and fifth fingers.  She denies injury, swelling or proximal pain.  Patient is a 41 y.o. female presenting with wrist pain. The history is provided by the patient.  Wrist Pain This is a new problem. The current episode started in the past 7 days. The problem occurs constantly. The problem has been unchanged. Associated symptoms include arthralgias and numbness. Pertinent negatives include no chills, fever, joint swelling, neck pain, rash, vomiting or weakness. Exacerbated by: movement and palpation. She has tried nothing for the symptoms. The treatment provided no relief.    Past Medical History  Diagnosis Date  . Left knee pain   . Migraine     Past Surgical History  Procedure Date  . Tubal ligation     History reviewed. No pertinent family history.  History  Substance Use Topics  . Smoking status: Never Smoker   . Smokeless tobacco: Not on file  . Alcohol Use: No    OB History    Grav Para Term Preterm Abortions TAB SAB Ect Mult Living                  Review of Systems  Constitutional: Negative for fever and chills.  HENT: Negative for neck pain.   Gastrointestinal: Negative for vomiting.  Genitourinary: Negative for dysuria and difficulty urinating.  Musculoskeletal: Positive for arthralgias. Negative for joint swelling.  Skin: Negative for color change, rash and wound.  Neurological: Positive for numbness. Negative for weakness.  All other systems reviewed and are negative.    Allergies  Benadryl and  Onion  Home Medications   Current Outpatient Rx  Name  Route  Sig  Dispense  Refill  . IBUPROFEN 600 MG PO TABS   Oral   Take 1 tablet (600 mg total) by mouth every 6 (six) hours as needed for pain.   30 tablet   0   . TRAMADOL HCL 50 MG PO TABS   Oral   Take 1 tablet (50 mg total) by mouth every 6 (six) hours as needed for pain.   15 tablet   0     BP 108/59  Pulse 90  Temp 97.8 F (36.6 C) (Oral)  Resp 18  Ht 5\' 4"  (1.626 m)  Wt 250 lb (113.399 kg)  BMI 42.91 kg/m2  SpO2 100%  LMP 12/19/2011  Physical Exam  Nursing note and vitals reviewed. Constitutional: She is oriented to person, place, and time. She appears well-developed and well-nourished. No distress.  HENT:  Head: Normocephalic and atraumatic.  Cardiovascular: Normal rate, regular rhythm and normal heart sounds.   Pulmonary/Chest: Effort normal and breath sounds normal.  Musculoskeletal: She exhibits tenderness. She exhibits no edema.       Right wrist: She exhibits tenderness. She exhibits normal range of motion, no bony tenderness, no swelling, no effusion, no crepitus, no deformity and no laceration.       Left wrist is ttp of the palmar aspect.  Radial pulse is brisk, distal sensation intact.  CR< 2 sec.  No bruising or  deformity.  Patient has full ROM.  Neurological: She is alert and oriented to person, place, and time. She exhibits normal muscle tone. Coordination normal.  Skin: Skin is warm and dry.    ED Course  Procedures (including critical care time)  Labs Reviewed - No data to display Dg Hand Complete Right  12/21/2011  *RADIOLOGY REPORT*  Clinical Data: Hand pain.  RIGHT HAND - COMPLETE 3+ VIEW  Comparison: None  Findings: Three views of the right hand are negative for acute fracture or dislocation.  There is normal alignment of the left hand.  No gross soft tissue abnormality.  IMPRESSION: No acute bony abnormality.   Original Report Authenticated By: Richarda Overlie, M.D.      Wrist splint  applied.  Pain improved, remains NV intact  MDM   Patient with positive Tinel's sign and tingling sensation to the fourth and fifth fingers.  Sx's likely related to carpal tunnel syndrome.  She agrees to f/u with orthopedics.  Will give referral for Dr. Romeo Apple and Dr. Hilda Lias.   Prescribed: norco #10 Ibuprofen 800 mg        Sherl Yzaguirre L. [place], [place] 12/23/11 2020

## 2011-12-21 NOTE — ED Notes (Signed)
Pt c/o left hand pain x 2 days. Pt denies any injury.

## 2011-12-24 NOTE — ED Provider Notes (Signed)
Medical screening examination/treatment/procedure(s) were performed by non-physician practitioner and as supervising physician I was immediately available for consultation/collaboration.   Rodgers Likes L Mayo Owczarzak, MD 12/24/11 1243 

## 2012-01-19 ENCOUNTER — Emergency Department (HOSPITAL_COMMUNITY)
Admission: EM | Admit: 2012-01-19 | Discharge: 2012-01-19 | Disposition: A | Discharge status: Home / Self Care | Payer: Self-pay | Attending: Emergency Medicine | Admitting: Emergency Medicine

## 2012-01-19 ENCOUNTER — Encounter (HOSPITAL_COMMUNITY): Payer: Self-pay | Admitting: *Deleted

## 2012-01-19 DIAGNOSIS — R110 Nausea: Secondary | ICD-10-CM | POA: Insufficient documentation

## 2012-01-19 DIAGNOSIS — R51 Headache: Secondary | ICD-10-CM | POA: Insufficient documentation

## 2012-01-19 DIAGNOSIS — G43909 Migraine, unspecified, not intractable, without status migrainosus: Secondary | ICD-10-CM | POA: Insufficient documentation

## 2012-01-19 DIAGNOSIS — H53149 Visual discomfort, unspecified: Secondary | ICD-10-CM | POA: Insufficient documentation

## 2012-01-19 DIAGNOSIS — H93299 Other abnormal auditory perceptions, unspecified ear: Secondary | ICD-10-CM | POA: Insufficient documentation

## 2012-01-19 MED ORDER — METHYLPREDNISOLONE SODIUM SUCC 125 MG IJ SOLR
125.0000 mg | Freq: Once | INTRAMUSCULAR | Status: AC
  Administered 2012-01-19: 125 mg via INTRAVENOUS
  Filled 2012-01-19: qty 2

## 2012-01-19 MED ORDER — ONDANSETRON 4 MG PO TBDP
4.0000 mg | ORAL_TABLET | Freq: Once | ORAL | Status: AC
  Administered 2012-01-19: 4 mg via ORAL
  Filled 2012-01-19: qty 1

## 2012-01-19 MED ORDER — HYDROMORPHONE HCL PF 2 MG/ML IJ SOLN
2.0000 mg | Freq: Once | INTRAMUSCULAR | Status: AC
  Administered 2012-01-19: 2 mg via INTRAVENOUS
  Filled 2012-01-19: qty 1

## 2012-01-19 NOTE — ED Provider Notes (Signed)
History     CSN: [id number]  Arrival date & time 01/19/12  1317   First MD Initiated Contact with Patient 01/19/12 1347      Chief Complaint  Patient presents with  . Migraine    (Consider location/radiation/quality/duration/timing/severity/associated sxs/prior treatment) HPI Comments: Pain to B temporal areas.  No trauma.  Pain typical of prev migraines.  Patient is a 41 y.o. female presenting with migraines. The history is provided by the patient. No language interpreter was used.  Migraine This is a new problem. The current episode started yesterday. The problem occurs constantly. Associated symptoms include headaches and nausea. Pertinent negatives include no chills, fever, myalgias, neck pain, numbness, swollen glands, vomiting or weakness. Exacerbated by: light and sound. Treatments tried: tylenol with codiene. The treatment provided no relief.    Past Medical History  Diagnosis Date  . Left knee pain   . Migraine     Past Surgical History  Procedure Date  . Tubal ligation     No family history on file.  History  Substance Use Topics  . Smoking status: Never Smoker   . Smokeless tobacco: Not on file  . Alcohol Use: No    OB History    Grav Para Term Preterm Abortions TAB SAB Ect Mult Living                  Review of Systems  Constitutional: Negative for fever and chills.  HENT: Negative for neck pain.   Gastrointestinal: Positive for nausea. Negative for vomiting.  Musculoskeletal: Negative for myalgias.  Neurological: Positive for headaches. Negative for weakness and numbness.  Psychiatric/Behavioral: Negative for confusion and decreased concentration.  All other systems reviewed and are negative.    Allergies  Benadryl and Onion  Home Medications   Current Outpatient Rx  Name  Route  Sig  Dispense  Refill  . ACETAMINOPHEN-CODEINE #3 300-30 MG PO TABS   Oral   Take 1 tablet by mouth every 4 (four) hours as needed. pain           BP  115/76  Pulse 93  Temp 98.2 F (36.8 C) (Oral)  Resp 20  Ht 5\' 4"  (1.626 m)  Wt 252 lb (114.306 kg)  BMI 43.26 kg/m2  SpO2 100%  LMP 12/19/2011  Physical Exam  Nursing note and vitals reviewed. Constitutional: She is oriented to person, place, and time. She appears well-developed and well-nourished. No distress.  HENT:  Head: Normocephalic and atraumatic.  Eyes: EOM are normal.  Neck: Normal range of motion.  Cardiovascular: Normal rate and regular rhythm.   Pulmonary/Chest: Effort normal.  Abdominal: Soft. She exhibits no distension. There is no tenderness.  Musculoskeletal: Normal range of motion.  Neurological: She is alert and oriented to person, place, and time. She has normal strength. No cranial nerve deficit or sensory deficit. She displays a negative Romberg sign. Coordination and gait normal. GCS eye subscore is 4. GCS verbal subscore is 5. GCS motor subscore is 6.  Skin: Skin is warm and dry.  Psychiatric: She has a normal mood and affect. Judgment normal.    ED Course  Procedures (including critical care time)  Labs Reviewed - No data to display No results found.   1. Migraine       MDM  Dilaudid 2 mg, solumedrol a25 mg-IM zofran 4 mg ODT Return prn        Evalina Field, [place] 01/19/12 1448

## 2012-01-19 NOTE — ED Notes (Signed)
Pt states migraine began last night. States photophobia. Similar to past migraines, although pt states she is extremely sensitive to light and sound. Denies vomiting.

## 2012-01-19 NOTE — ED Notes (Signed)
Headache since last pm. Says she thinks it is a migraine. No hx of injury. Eyes are sensitive to light.

## 2012-01-21 NOTE — ED Provider Notes (Signed)
Medical screening examination/treatment/procedure(s) were performed by non-physician practitioner and as supervising physician I was immediately available for consultation/collaboration.   Laray Anger, DO 01/21/12 1238

## 2012-04-06 ENCOUNTER — Emergency Department (HOSPITAL_COMMUNITY): Payer: Self-pay

## 2012-04-06 ENCOUNTER — Encounter (HOSPITAL_COMMUNITY): Payer: Self-pay | Admitting: *Deleted

## 2012-04-06 ENCOUNTER — Emergency Department (HOSPITAL_COMMUNITY)
Admission: EM | Admit: 2012-04-06 | Discharge: 2012-04-06 | Disposition: A | Discharge status: Home / Self Care | Payer: Self-pay | Attending: Emergency Medicine | Admitting: Emergency Medicine

## 2012-04-06 DIAGNOSIS — J40 Bronchitis, not specified as acute or chronic: Secondary | ICD-10-CM

## 2012-04-06 DIAGNOSIS — R059 Cough, unspecified: Secondary | ICD-10-CM | POA: Insufficient documentation

## 2012-04-06 DIAGNOSIS — R05 Cough: Secondary | ICD-10-CM | POA: Insufficient documentation

## 2012-04-06 DIAGNOSIS — Z8739 Personal history of other diseases of the musculoskeletal system and connective tissue: Secondary | ICD-10-CM | POA: Insufficient documentation

## 2012-04-06 DIAGNOSIS — Z8679 Personal history of other diseases of the circulatory system: Secondary | ICD-10-CM | POA: Insufficient documentation

## 2012-04-06 DIAGNOSIS — J209 Acute bronchitis, unspecified: Secondary | ICD-10-CM | POA: Insufficient documentation

## 2012-04-06 LAB — COMPREHENSIVE METABOLIC PANEL
ALT: 7 U/L (ref 0–35)
AST: 16 U/L (ref 0–37)
Alkaline Phosphatase: 77 U/L (ref 39–117)
CO2: 27 mEq/L (ref 19–32)
Chloride: 101 mEq/L (ref 96–112)
GFR calc Af Amer: 61 mL/min — ABNORMAL LOW (ref >90)
GFR calc non Af Amer: 52 mL/min — ABNORMAL LOW (ref >90)
Glucose, Bld: 97 mg/dL (ref 70–99)
Sodium: 138 mEq/L (ref 135–145)
Total Bilirubin: 0.1 mg/dL — ABNORMAL LOW (ref 0.3–1.2)

## 2012-04-06 LAB — CBC WITH DIFFERENTIAL/PLATELET
Basophils Absolute: 0 10*3/uL (ref 0.0–0.1)
HCT: 35.1 % — ABNORMAL LOW (ref 36.0–46.0)
Lymphocytes Relative: 35 % (ref 12–46)
Lymphs Abs: 1.9 10*3/uL (ref 0.7–4.0)
MCV: 85 fL (ref 78.0–100.0)
Monocytes Absolute: 0.6 10*3/uL (ref 0.1–1.0)
Neutro Abs: 2.7 10*3/uL (ref 1.7–7.7)
Platelets: 304 10*3/uL (ref 150–400)
RBC: 4.13 MIL/uL (ref 3.87–5.11)
RDW: 15.3 % (ref 11.5–15.5)
WBC: 5.4 10*3/uL (ref 4.0–10.5)

## 2012-04-06 MED ORDER — PROMETHAZINE HCL 25 MG PO TABS: 25.0000 mg | ORAL_TABLET | Freq: Four times a day (QID) | ORAL | Status: DC | PRN

## 2012-04-06 MED ORDER — IBUPROFEN 800 MG PO TABS: 800.0000 mg | ORAL_TABLET | Freq: Three times a day (TID) | ORAL | Status: DC | PRN

## 2012-04-06 MED ORDER — AMOXICILLIN 500 MG PO CAPS: 500.0000 mg | ORAL_CAPSULE | Freq: Three times a day (TID) | ORAL | Status: DC

## 2012-04-06 MED ORDER — KETOROLAC TROMETHAMINE 30 MG/ML IJ SOLN
30.0000 mg | Freq: Once | INTRAMUSCULAR | Status: AC
  Administered 2012-04-06: 30 mg via INTRAVENOUS
  Filled 2012-04-06: qty 1

## 2012-04-06 MED ORDER — SODIUM CHLORIDE 0.9 % IV BOLUS (SEPSIS)
1000.0000 mL | Freq: Once | INTRAVENOUS | Status: AC
  Administered 2012-04-06: 1000 mL via INTRAVENOUS

## 2012-04-06 MED ORDER — ONDANSETRON HCL 4 MG/2ML IJ SOLN
4.0000 mg | Freq: Once | INTRAMUSCULAR | Status: AC
  Administered 2012-04-06: 4 mg via INTRAVENOUS
  Filled 2012-04-06: qty 2

## 2012-04-06 NOTE — ED Provider Notes (Addendum)
History     CSN: [id number]  Arrival date & time 04/06/12  1403   First MD Initiated Contact with Patient 04/06/12 1416      Chief Complaint  Patient presents with  . Generalized Body Aches  . Emesis    (Consider location/radiation/quality/duration/timing/severity/associated sxs/prior treatment) Patient is a 42 y.o. female presenting with cough. The history is provided by the patient (pt complains of cough and vomiting).  Cough Cough characteristics:  Productive Sputum characteristics:  Green Severity:  Moderate Onset quality:  Gradual Timing:  Constant Chronicity:  Recurrent Smoker: no   Context: not animal exposure   Associated symptoms: no chest pain, no eye discharge, no headaches and no rash     Past Medical History  Diagnosis Date  . Left knee pain   . Migraine     Past Surgical History  Procedure Laterality Date  . Tubal ligation      History reviewed. No pertinent family history.  History  Substance Use Topics  . Smoking status: Never Smoker   . Smokeless tobacco: Not on file  . Alcohol Use: No    OB History   Grav Para Term Preterm Abortions TAB SAB Ect Mult Living                  Review of Systems  Constitutional: Negative for fatigue.  HENT: Negative for congestion, sinus pressure and ear discharge.   Eyes: Negative for discharge.  Respiratory: Positive for cough.   Cardiovascular: Negative for chest pain.  Gastrointestinal: Positive for nausea and vomiting. Negative for abdominal pain and diarrhea.  Genitourinary: Negative for frequency and hematuria.  Musculoskeletal: Negative for back pain.  Skin: Negative for rash.  Neurological: Negative for seizures and headaches.  Psychiatric/Behavioral: Negative for hallucinations.    Allergies  Benadryl and Onion  Home Medications   Current Outpatient Rx  Name  Route  Sig  Dispense  Refill  . dextromethorphan-guaiFENesin (TUSSIN DM) 10-100 MG/5ML liquid   Oral   Take 5-15 mLs by mouth  every 4 (four) hours as needed for cough.         . Diphenhydramine-Phenylephrine (NIGHT TIME COUGH/COLD CHILD) 6.25-2.5 MG/5ML LIQD   Oral   Take 5-15 mLs by mouth daily as needed (for cold and cough).         Marland Kitchen amoxicillin (AMOXIL) 500 MG capsule   Oral   Take 1 capsule (500 mg total) by mouth 3 (three) times daily.   21 capsule   0   . ibuprofen (ADVIL,MOTRIN) 800 MG tablet   Oral   Take 1 tablet (800 mg total) by mouth every 8 (eight) hours as needed for pain.   21 tablet   0   . promethazine (PHENERGAN) 25 MG tablet   Oral   Take 1 tablet (25 mg total) by mouth every 6 (six) hours as needed for nausea.   15 tablet   0     BP 93/62  Pulse 96  Temp(Src) 97.7 F (36.5 C) (Oral)  Resp 20  Ht 5\' 6"  (1.676 m)  Wt 249 lb (112.946 kg)  BMI 40.21 kg/m2  SpO2 97%  LMP 02/25/2012  Physical Exam  Constitutional: She is oriented to person, place, and time. She appears well-developed.  HENT:  Head: Normocephalic and atraumatic.  Eyes: Conjunctivae and EOM are normal. No scleral icterus.  Neck: Neck supple. No thyromegaly present.  Cardiovascular: Normal rate and regular rhythm.  Exam reveals no gallop and no friction rub.   No murmur  heard. Pulmonary/Chest: No stridor. She has no wheezes. She has no rales. She exhibits no tenderness.  Abdominal: She exhibits no distension. There is no tenderness. There is no rebound.  Musculoskeletal: Normal range of motion. She exhibits no edema.  Lymphadenopathy:    She has no cervical adenopathy.  Neurological: She is oriented to person, place, and time. Coordination normal.  Skin: No rash noted. No erythema.  Psychiatric: She has a normal mood and affect. Her behavior is normal.    ED Course  Procedures (including critical care time)  Labs Reviewed  CBC WITH DIFFERENTIAL - Abnormal; Notable for the following:    Hemoglobin 11.5 (*)    HCT 35.1 (*)    All other components within normal limits  COMPREHENSIVE METABOLIC PANEL  - Abnormal; Notable for the following:    Creatinine, Ser 1.24 (*)    Albumin 3.0 (*)    Total Bilirubin 0.1 (*)    GFR calc non Af Amer 52 (*)    GFR calc Af Amer 61 (*)    All other components within normal limits   Dg Chest Portable 1 View  04/06/2012  *RADIOLOGY REPORT*  Clinical Data: Shortness of breath.  Body aches.  PORTABLE CHEST - 1 VIEW  Comparison: 12/12/2011.  Findings: Mild central pulmonary vascular prominence and minimal peribronchial thickening stable.  No segmental infiltrate, congestive heart failure or gross pneumothorax.  Slightly tortuous aorta may be caused by minimal curvature of the thoracic spine.  Heart size within normal limits.  IMPRESSION: No acute abnormality.  Please see above.   Original Report Authenticated By: Lacy Duverney, M.D.      1. Bronchitis       MDM    Pt improved with tx     Benny Lennert, MD 04/06/12 1540  Benny Lennert, MD 04/06/12 [phone]

## 2012-04-06 NOTE — ED Notes (Signed)
Pt c/o HA, sore throat, vomiting, body aches, cough; denies diarrhea

## 2012-05-26 ENCOUNTER — Emergency Department (HOSPITAL_COMMUNITY)
Admission: EM | Admit: 2012-05-26 | Discharge: 2012-05-26 | Disposition: A | Discharge status: Home / Self Care | Payer: Self-pay | Attending: Emergency Medicine | Admitting: Emergency Medicine

## 2012-05-26 ENCOUNTER — Encounter (HOSPITAL_COMMUNITY): Payer: Self-pay | Admitting: *Deleted

## 2012-05-26 ENCOUNTER — Emergency Department (HOSPITAL_COMMUNITY): Payer: Self-pay

## 2012-05-26 DIAGNOSIS — Z8541 Personal history of malignant neoplasm of cervix uteri: Secondary | ICD-10-CM | POA: Insufficient documentation

## 2012-05-26 DIAGNOSIS — Z8679 Personal history of other diseases of the circulatory system: Secondary | ICD-10-CM | POA: Insufficient documentation

## 2012-05-26 DIAGNOSIS — R0602 Shortness of breath: Secondary | ICD-10-CM | POA: Insufficient documentation

## 2012-05-26 DIAGNOSIS — M549 Dorsalgia, unspecified: Secondary | ICD-10-CM | POA: Insufficient documentation

## 2012-05-26 DIAGNOSIS — Z8739 Personal history of other diseases of the musculoskeletal system and connective tissue: Secondary | ICD-10-CM | POA: Insufficient documentation

## 2012-05-26 HISTORY — DX: Malignant (primary) neoplasm, unspecified: C80.1

## 2012-05-26 LAB — BASIC METABOLIC PANEL
Calcium: 9 mg/dL (ref 8.4–10.5)
GFR calc Af Amer: 70 mL/min — ABNORMAL LOW (ref >90)
GFR calc non Af Amer: 61 mL/min — ABNORMAL LOW (ref >90)
Glucose, Bld: 90 mg/dL (ref 70–99)
Sodium: 138 mEq/L (ref 135–145)

## 2012-05-26 LAB — CBC
MCH: 27.4 pg (ref 26.0–34.0)
MCHC: 32.9 g/dL (ref 30.0–36.0)
Platelets: 282 10*3/uL (ref 150–400)
RDW: 14.4 % (ref 11.5–15.5)

## 2012-05-26 LAB — TROPONIN I: Troponin I: <0.3 ng/mL (ref <0.30)

## 2012-05-26 LAB — D-DIMER, QUANTITATIVE: D-Dimer, Quant: <0.27 ug/mL-FEU (ref 0.00–0.48)

## 2012-05-26 MED ORDER — MORPHINE SULFATE 4 MG/ML IJ SOLN
6.0000 mg | Freq: Once | INTRAMUSCULAR | Status: AC
  Administered 2012-05-26: 6 mg via INTRAVENOUS
  Filled 2012-05-26: qty 2

## 2012-05-26 MED ORDER — IBUPROFEN 600 MG PO TABS: 600.0000 mg | ORAL_TABLET | Freq: Three times a day (TID) | ORAL | Status: DC | PRN

## 2012-05-26 MED ORDER — HYDROCODONE-ACETAMINOPHEN 5-325 MG PO TABS: 1.0000 | ORAL_TABLET | ORAL | Status: DC | PRN

## 2012-05-26 NOTE — ED Notes (Addendum)
Pt from home with reports of left, upper back pain that is worse with movement, breathing and lying down that started on Sunday. Pt reports that when she tries to raise left arm she feels a stabbing pain through back into chest. Pt denies known injury.  Addendum:  Pt took a muscle relaxer last night without any relief.  Pt reports one episode of emesis last night after dinner and reports SOB when lying flat along with knife twisting.

## 2012-05-26 NOTE — ED Provider Notes (Signed)
History     CSN: [id number]  Arrival date & time 05/26/12  1038   First MD Initiated Contact with Patient 05/26/12 1054      Chief Complaint  Patient presents with  . Back Pain    left, upper  . Shortness of Breath    (Consider location/radiation/quality/duration/timing/severity/associated sxs/prior treatment) The history is provided by the patient.   patient reports developing pain in her left scapular region that developed over the past 48 hours as worse with movement and worse when she takes a deep breath.  She reports that the pain is severe and makes her feel short of breath.  No chest pain.  No cough.  She feels like she has some shortness of breath.  No fevers or chills.  No unilateral leg swelling.  No history of DVT or pulmonary embolism.  Her symptoms are mild to moderate in severity.  Nothing improves her pain.  Past Medical History  Diagnosis Date  . Left knee pain   . Migraine   . Cancer     cervical cx at age 59    Past Surgical History  Procedure Laterality Date  . Tubal ligation      Family History  Problem Relation Age of Onset  . Cancer Mother     History  Substance Use Topics  . Smoking status: Never Smoker   . Smokeless tobacco: Never Used  . Alcohol Use: No    OB History   Grav Para Term Preterm Abortions TAB SAB Ect Mult Living                  Review of Systems  Respiratory: Positive for shortness of breath.   Musculoskeletal: Positive for back pain.  All other systems reviewed and are negative.    Allergies  Benadryl and Onion  Home Medications   Current Outpatient Rx  Name  Route  Sig  Dispense  Refill  . acetaminophen-codeine (TYLENOL #3) 300-30 MG per tablet   Oral   Take 1 tablet by mouth every 6 (six) hours as needed for pain.         Marland Kitchen ibuprofen (ADVIL,MOTRIN) 800 MG tablet   Oral   Take 1 tablet (800 mg total) by mouth every 8 (eight) hours as needed for pain.   21 tablet   0   . PRESCRIPTION MEDICATION  Oral   Take 1 tablet by mouth once. Muscle relaxer.         . traMADol (ULTRAM) 50 MG tablet   Oral   Take 50 mg by mouth every 6 (six) hours as needed for pain.         Marland Kitchen HYDROcodone-acetaminophen (NORCO/VICODIN) 5-325 MG per tablet   Oral   Take 1 tablet by mouth every 4 (four) hours as needed for pain.   6 tablet   0   . ibuprofen (ADVIL,MOTRIN) 600 MG tablet   Oral   Take 1 tablet (600 mg total) by mouth every 8 (eight) hours as needed for pain.   15 tablet   0     BP 122/97  Pulse 90  Temp(Src) 98.9 F (37.2 C) (Oral)  Resp 18  SpO2 99%  LMP 05/19/2012  Physical Exam  Nursing note and vitals reviewed. Constitutional: She is oriented to person, place, and time. She appears well-developed and well-nourished. No distress.  HENT:  Head: Normocephalic and atraumatic.  Eyes: EOM are normal.  Neck: Normal range of motion.  Cardiovascular: Normal rate, regular rhythm and normal  heart sounds.   Pulmonary/Chest: Effort normal and breath sounds normal.  Abdominal: Soft. She exhibits no distension. There is no tenderness.  Musculoskeletal: Normal range of motion.  Tenderness at left scapular region without rash noted.  Pain exacerbated with range of motion of her left shoulder  Neurological: She is alert and oriented to person, place, and time.  Skin: Skin is warm and dry.  Psychiatric: She has a normal mood and affect. Judgment normal.    ED Course  Procedures (including critical care time)  Labs Reviewed  CBC - Abnormal; Notable for the following:    Hemoglobin 11.8 (*)    HCT 35.9 (*)    All other components within normal limits  BASIC METABOLIC PANEL - Abnormal; Notable for the following:    GFR calc non Af Amer 61 (*)    GFR calc Af Amer 70 (*)    All other components within normal limits  TROPONIN I  D-DIMER, QUANTITATIVE   Dg Chest 2 View  05/26/2012  *RADIOLOGY REPORT*  Clinical Data: Shortness of breath.  Left side chest pain.  CHEST - 2 VIEW   Comparison: Single view of the chest 04/06/2012 and PA and lateral chest 12/12/2011.  Findings: Lungs are clear.  Heart size is normal.  No pneumothorax or pleural fluid.  IMPRESSION: Negative chest.   Original Report Authenticated By: Holley Dexter, M.D.    I personally reviewed the imaging tests through PACS system I reviewed available ER/hospitalization records through the EMR   1. Back pain       MDM  The patient is a feeling much better after pain medications.  Chest x-ray, EKG, troponin, d-dimer are all normal.  This is musculoskeletal pain.        Lyanne Co, MD 05/26/12 1440

## 2012-05-26 NOTE — ED Notes (Signed)
MD at bedside.  Kristi Mane, MD

## 2012-07-24 ENCOUNTER — Emergency Department (HOSPITAL_COMMUNITY)
Admission: EM | Admit: 2012-07-24 | Discharge: 2012-07-24 | Disposition: A | Discharge status: Home / Self Care | Payer: Self-pay | Attending: Emergency Medicine | Admitting: Emergency Medicine

## 2012-07-24 ENCOUNTER — Encounter (HOSPITAL_COMMUNITY): Payer: Self-pay | Admitting: *Deleted

## 2012-07-24 ENCOUNTER — Emergency Department (HOSPITAL_COMMUNITY): Payer: Self-pay

## 2012-07-24 DIAGNOSIS — Z8541 Personal history of malignant neoplasm of cervix uteri: Secondary | ICD-10-CM | POA: Insufficient documentation

## 2012-07-24 DIAGNOSIS — Z8679 Personal history of other diseases of the circulatory system: Secondary | ICD-10-CM | POA: Insufficient documentation

## 2012-07-24 DIAGNOSIS — Z8739 Personal history of other diseases of the musculoskeletal system and connective tissue: Secondary | ICD-10-CM | POA: Insufficient documentation

## 2012-07-24 DIAGNOSIS — Z3202 Encounter for pregnancy test, result negative: Secondary | ICD-10-CM | POA: Insufficient documentation

## 2012-07-24 DIAGNOSIS — R1011 Right upper quadrant pain: Secondary | ICD-10-CM | POA: Insufficient documentation

## 2012-07-24 DIAGNOSIS — M549 Dorsalgia, unspecified: Secondary | ICD-10-CM | POA: Insufficient documentation

## 2012-07-24 LAB — COMPREHENSIVE METABOLIC PANEL
ALT: 12 U/L (ref 0–35)
AST: 19 U/L (ref 0–37)
CO2: 26 mEq/L (ref 19–32)
Calcium: 9.1 mg/dL (ref 8.4–10.5)
Creatinine, Ser: 1.21 mg/dL — ABNORMAL HIGH (ref 0.50–1.10)
GFR calc non Af Amer: 54 mL/min — ABNORMAL LOW (ref >90)
Sodium: 140 mEq/L (ref 135–145)
Total Protein: 7.2 g/dL (ref 6.0–8.3)

## 2012-07-24 LAB — URINALYSIS, ROUTINE W REFLEX MICROSCOPIC
Glucose, UA: NEGATIVE mg/dL
Ketones, ur: 15 mg/dL — AB
Leukocytes, UA: NEGATIVE
Nitrite: NEGATIVE
Protein, ur: NEGATIVE mg/dL
Urobilinogen, UA: 0.2 mg/dL (ref 0.0–1.0)

## 2012-07-24 LAB — URINE MICROSCOPIC-ADD ON

## 2012-07-24 LAB — POCT PREGNANCY, URINE: Preg Test, Ur: NEGATIVE

## 2012-07-24 MED ORDER — MORPHINE SULFATE 4 MG/ML IJ SOLN
6.0000 mg | Freq: Once | INTRAMUSCULAR | Status: AC
  Administered 2012-07-24: 6 mg via INTRAMUSCULAR
  Filled 2012-07-24: qty 2

## 2012-07-24 MED ORDER — CYCLOBENZAPRINE HCL 5 MG PO TABS: 5.0000 mg | ORAL_TABLET | Freq: Three times a day (TID) | ORAL | Status: DC | PRN

## 2012-07-24 MED ORDER — ONDANSETRON 8 MG PO TBDP
8.0000 mg | ORAL_TABLET | Freq: Once | ORAL | Status: AC
  Administered 2012-07-24: 8 mg via ORAL
  Filled 2012-07-24: qty 1

## 2012-07-24 MED ORDER — HYDROCODONE-ACETAMINOPHEN 5-325 MG PO TABS: 1.0000 | ORAL_TABLET | ORAL | Status: DC | PRN

## 2012-07-24 NOTE — ED Notes (Signed)
J. Idol, PA at bedside. 

## 2012-07-24 NOTE — ED Notes (Signed)
Left sided lower back pain radiating down left leg since yesterday.  Denies injury.

## 2012-07-24 NOTE — ED Notes (Signed)
Pt with stabbing pain to left mid back last night that radiates down into left leg

## 2012-07-25 NOTE — ED Provider Notes (Signed)
History     CSN: [id number]  Arrival date & time 07/24/12  1150   First MD Initiated Contact with Patient 07/24/12 1158      Chief Complaint  Patient presents with  . Back Pain    (Consider location/radiation/quality/duration/timing/severity/associated sxs/prior treatment) HPI Comments: Kristi Martin is a 42 y.o. Female with no significant past medical history presenting with left upper back pain which started suddenly last night while she was sitting watching TV.  She describes intermittent sharp stabs of pain which starts in her left scapular area and occasionally radiates into her lower back.  Pain is intermittent, can occur at rest but more likely to occur with movement and when she lies on that side.  She denies shortness of breath, although taking a deep breath can make the pain worse.  She denies chest pain, has had no coughing, no fevers or chills, has had no lower extremity swelling.  She has taken no medications prior to arrival nor has she found any alleviators for this symptom.  She was seen here for the same symptoms almost 2 months ago at which time her symptoms were felt to be musculoskeletal in origin, was treated with anti-inflammatories and narcotic pain relievers and her symptoms completely resolved until yesterday.  She denies any injury, no new strenuous activities or heavy lifting.     The history is provided by the patient.    Past Medical History  Diagnosis Date  . Left knee pain   . Migraine   . Cancer     cervical cx at age 53    Past Surgical History  Procedure Laterality Date  . Tubal ligation      Family History  Problem Relation Age of Onset  . Cancer Mother     History  Substance Use Topics  . Smoking status: Never Smoker   . Smokeless tobacco: Never Used  . Alcohol Use: No    OB History   Grav Para Term Preterm Abortions TAB SAB Ect Mult Living                  Review of Systems  Constitutional: Negative for fever.    Respiratory: Negative for shortness of breath.   Cardiovascular: Negative for chest pain and leg swelling.  Gastrointestinal: Negative for abdominal pain, constipation and abdominal distention.  Genitourinary: Negative for dysuria, urgency, frequency, flank pain and difficulty urinating.  Musculoskeletal: Positive for back pain. Negative for joint swelling and gait problem.  Skin: Negative for rash.  Neurological: Negative for weakness and numbness.    Allergies  Benadryl and Onion  Home Medications   Current Outpatient Rx  Name  Route  Sig  Dispense  Refill  . ibuprofen (ADVIL,MOTRIN) 800 MG tablet   Oral   Take 1 tablet (800 mg total) by mouth every 8 (eight) hours as needed for pain.   21 tablet   0   . cyclobenzaprine (FLEXERIL) 5 MG tablet   Oral   Take 1 tablet (5 mg total) by mouth 3 (three) times daily as needed for muscle spasms.   15 tablet   0   . HYDROcodone-acetaminophen (NORCO/VICODIN) 5-325 MG per tablet   Oral   Take 1 tablet by mouth every 4 (four) hours as needed for pain.   15 tablet   0     BP 110/69  Pulse 71  Temp(Src) 97.8 F (36.6 C) (Oral)  Resp 18  Ht 5\' 4"  (1.626 m)  Wt 250 lb (113.399 kg)  BMI 42.89 kg/m2  SpO2 98%  LMP 06/03/2012  Physical Exam  Nursing note and vitals reviewed. Constitutional: She appears well-developed and well-nourished.  HENT:  Head: Normocephalic.  Eyes: Conjunctivae are normal.  Neck: Normal range of motion. Neck supple.  Cardiovascular: Normal rate and intact distal pulses.   Pedal pulses normal.  Pulmonary/Chest: Effort normal. No respiratory distress. She has no wheezes. She exhibits no tenderness.  Abdominal: Soft. Bowel sounds are normal. She exhibits no distension and no mass. There is tenderness in the right upper quadrant.  Mild right upper quadrant tenderness without guard or rebound  Musculoskeletal: Normal range of motion. She exhibits no edema.       Thoracic back: She exhibits tenderness.  She exhibits no bony tenderness, no swelling and no edema.       Lumbar back: She exhibits tenderness. She exhibits no swelling and no edema.       Back:  Neurological: She is alert. She has normal strength. She displays no atrophy and no tremor. No sensory deficit. Gait normal.  Reflex Scores:      Patellar reflexes are 2+ on the right side and 2+ on the left side.      Achilles reflexes are 2+ on the right side and 2+ on the left side. No strength deficit noted in hip and knee flexor and extensor muscle groups.  Ankle flexion and extension intact.  Skin: Skin is warm and dry. No rash noted.  Psychiatric: She has a normal mood and affect.    ED Course  Procedures (including critical care time)  Labs Reviewed  URINALYSIS, ROUTINE W REFLEX MICROSCOPIC - Abnormal; Notable for the following:    APPearance HAZY (*)    Specific Gravity, Urine >1.030 (*)    Hgb urine dipstick TRACE (*)    Bilirubin Urine SMALL (*)    Ketones, ur 15 (*)    All other components within normal limits  COMPREHENSIVE METABOLIC PANEL - Abnormal; Notable for the following:    Potassium 3.4 (*)    Glucose, Bld 141 (*)    Creatinine, Ser 1.21 (*)    Albumin 3.1 (*)    Total Bilirubin 0.2 (*)    GFR calc non Af Amer 54 (*)    GFR calc Af Amer 63 (*)    All other components within normal limits  URINE MICROSCOPIC-ADD ON - Abnormal; Notable for the following:    Squamous Epithelial / LPF FEW (*)    All other components within normal limits  LIPASE, BLOOD  POCT PREGNANCY, URINE   Dg Chest 2 View  07/24/2012   *RADIOLOGY REPORT*  Clinical Data:  Pleuritic chest pain.  CHEST - 2 VIEW  Comparison: 05/26/2012  Findings: The heart size and mediastinal contours are within normal limits.  Both lungs are clear.  The visualized skeletal structures are unremarkable.  IMPRESSION: No active disease.   Original Report Authenticated By: Irish Lack, M.D.     1. Back pain, acute       MDM  Laboratory results were  reviewed and discussed with patient.  Given her right upper  quadrant discomfort liver and pancreas labs were evaluated to rule out atypical referred pain from gallbladder.  She was prescribed hydrocodone and Flexeril as I suspect that this is musculoskeletal, possibly muscle spasm given its intermittent nature.  It is also encouraged to use heating pad 20 minutes several times daily.  Referrals were given for establishment of primary care, in the interim encouraged return here for any  worsened or persistent symptoms.  This patient was discussed with Dr. Patria Mane prior to discharge home.      Burgess Amor, PA-C 07/25/12 1814

## 2012-07-26 NOTE — ED Provider Notes (Signed)
Medical screening examination/treatment/procedure(s) were performed by non-physician practitioner and as supervising physician I was immediately available for consultation/collaboration.   Givanni Staron M Tighe Gitto, MD 07/26/12 0718 

## 2012-08-25 ENCOUNTER — Emergency Department (HOSPITAL_COMMUNITY): Payer: Self-pay

## 2012-08-25 ENCOUNTER — Emergency Department (HOSPITAL_COMMUNITY)
Admission: EM | Admit: 2012-08-25 | Discharge: 2012-08-25 | Disposition: A | Discharge status: Home / Self Care | Payer: Self-pay | Attending: Emergency Medicine | Admitting: Emergency Medicine

## 2012-08-25 DIAGNOSIS — R0602 Shortness of breath: Secondary | ICD-10-CM | POA: Insufficient documentation

## 2012-08-25 DIAGNOSIS — M7918 Myalgia, other site: Secondary | ICD-10-CM

## 2012-08-25 DIAGNOSIS — M549 Dorsalgia, unspecified: Secondary | ICD-10-CM | POA: Insufficient documentation

## 2012-08-25 DIAGNOSIS — Z8679 Personal history of other diseases of the circulatory system: Secondary | ICD-10-CM | POA: Insufficient documentation

## 2012-08-25 DIAGNOSIS — Z8541 Personal history of malignant neoplasm of cervix uteri: Secondary | ICD-10-CM | POA: Insufficient documentation

## 2012-08-25 LAB — CBC WITH DIFFERENTIAL/PLATELET
Basophils Absolute: 0 10*3/uL (ref 0.0–0.1)
Eosinophils Absolute: 0.4 10*3/uL (ref 0.0–0.7)
Eosinophils Relative: 8 % — ABNORMAL HIGH (ref 0–5)
Lymphocytes Relative: 33 % (ref 12–46)
MCH: 27.3 pg (ref 26.0–34.0)
MCV: 83.9 fL (ref 78.0–100.0)
Neutrophils Relative %: 51 % (ref 43–77)
Platelets: 292 10*3/uL (ref 150–400)
RBC: 4.29 MIL/uL (ref 3.87–5.11)
RDW: 14.5 % (ref 11.5–15.5)
WBC: 4.9 10*3/uL (ref 4.0–10.5)

## 2012-08-25 LAB — BASIC METABOLIC PANEL
Calcium: 9 mg/dL (ref 8.4–10.5)
GFR calc non Af Amer: 59 mL/min — ABNORMAL LOW (ref >90)
Potassium: 3.8 mEq/L (ref 3.5–5.1)
Sodium: 138 mEq/L (ref 135–145)

## 2012-08-25 LAB — D-DIMER, QUANTITATIVE: D-Dimer, Quant: 0.39 ug/mL-FEU (ref 0.00–0.48)

## 2012-08-25 LAB — TROPONIN I: Troponin I: <0.3 ng/mL (ref <0.30)

## 2012-08-25 MED ORDER — HYDROCODONE-ACETAMINOPHEN 5-325 MG PO TABS: ORAL_TABLET | ORAL | Status: DC

## 2012-08-25 MED ORDER — NAPROXEN 250 MG PO TABS: 250.0000 mg | ORAL_TABLET | Freq: Two times a day (BID) | ORAL | Status: DC

## 2012-08-25 MED ORDER — DIAZEPAM 5 MG PO TABS
5.0000 mg | ORAL_TABLET | Freq: Once | ORAL | Status: AC
  Administered 2012-08-25: 5 mg via ORAL
  Filled 2012-08-25: qty 1

## 2012-08-25 MED ORDER — OXYCODONE-ACETAMINOPHEN 5-325 MG PO TABS
1.0000 | ORAL_TABLET | Freq: Once | ORAL | Status: AC
  Administered 2012-08-25: 1 via ORAL
  Filled 2012-08-25: qty 1

## 2012-08-25 MED ORDER — METHOCARBAMOL 500 MG PO TABS: 1000.0000 mg | ORAL_TABLET | Freq: Four times a day (QID) | ORAL | Status: DC | PRN

## 2012-08-25 NOTE — ED Notes (Signed)
States that she started having left sided mid back pain yesterday that has gotten worse, states now the pain is to the point that she is having shortness of breath, states that she has been seen for this problem before.

## 2012-08-25 NOTE — ED Notes (Signed)
States that the pain is radiating down her left arm.

## 2012-08-25 NOTE — ED Provider Notes (Signed)
History    CSN: [id number] Arrival date & time 08/25/12  1203  First MD Initiated Contact with Patient 08/25/12 1314     Chief Complaint  Patient presents with  . Shortness of Breath  . Back Pain   HPI Pt was seen at 1325. Per pt, c/o gradual onset and persistence of constant left sided mid-back "pain" that began yesterday. Pt states the pain "makes me short of breath."  Pain worsens with movement of her torso and palpation of the area.  States she "lays on my right side a lot" before the pain began. Denies CP/palpitations, no cough, no abd pain, no N/V/D, no fevers, no rash, no injury. The patient has a significant history of similar symptoms previously, recently being evaluated for this complaint and multiple prior evals for same. Pt states she has been previously dx with "muscle spasm" when she has had this pain.      Past Medical History  Diagnosis Date  . Left knee pain   . Migraine   . Cancer     cervical cx at age 73   Past Surgical History  Procedure Laterality Date  . Tubal ligation     Family History  Problem Relation Age of Onset  . Cancer Mother    History  Substance Use Topics  . Smoking status: Never Smoker   . Smokeless tobacco: Never Used  . Alcohol Use: No    Review of Systems ROS: Statement: All systems negative except as marked or noted in the HPI; Constitutional: Negative for fever and chills. ; ; Eyes: Negative for eye pain, redness and discharge. ; ; ENMT: Negative for ear pain, hoarseness, nasal congestion, sinus pressure and sore throat. ; ; Cardiovascular: Negative for chest pain, palpitations, diaphoresis, and peripheral edema. ; ; Respiratory: +SOB. Negative for cough, wheezing and stridor. ; ; Gastrointestinal: Negative for nausea, vomiting, diarrhea, abdominal pain, blood in stool, hematemesis, jaundice and rectal bleeding. . ; ; Genitourinary: Negative for dysuria, flank pain and hematuria. ; ; Musculoskeletal: +left back pain. Negative for neck  pain. Negative for swelling and trauma.; ; Skin: Negative for pruritus, rash, abrasions, blisters, bruising and skin lesion.; ; Neuro: Negative for headache, lightheadedness and neck stiffness. Negative for weakness, altered level of consciousness , altered mental status, extremity weakness, paresthesias, involuntary movement, seizure and syncope.       Allergies  Benadryl and Onion  Home Medications  No current outpatient prescriptions on file. BP 111/58  Pulse 96  Temp(Src) 97.6 F (36.4 C) (Oral)  Resp 22  Ht 5\' 5"  (1.651 m)  Wt 250 lb (113.399 kg)  BMI 41.6 kg/m2  SpO2 99%  LMP 07/30/2012 Physical Exam 1330: Physical examination:  Nursing notes reviewed; Vital signs and O2 SAT reviewed;  Constitutional: Well developed, Well nourished, Well hydrated, In no acute distress; Head:  Normocephalic, atraumatic; Eyes: EOMI, PERRL, No scleral icterus; ENMT: Mouth and pharynx normal, Mucous membranes moist; Neck: Supple, Full range of motion, No lymphadenopathy; Cardiovascular: Regular rate and rhythm, No murmur, rub, or gallop; Respiratory: Breath sounds clear & equal bilaterally, No rales, rhonchi, wheezes.  Speaking full sentences with ease, Normal respiratory effort/excursion; Chest: Nontender, Movement normal; Abdomen: Soft, Nontender, Nondistended, Normal bowel sounds; Genitourinary: No CVA tenderness; Spine:  No midline CS, TS, LS tenderness.  +TTP left thoracic paraspinal muscles which reproduces pt's pain. No rash.;; Extremities: Pulses normal, No tenderness, No edema, No calf edema or asymmetry.; Neuro: AA&Ox3, Major CN grossly intact.  Speech clear. No gross focal  motor or sensory deficits in extremities.; Skin: Color normal, Warm, Dry.   ED Course  Procedures     MDM  MDM Reviewed: previous chart, nursing note and vitals Reviewed previous: labs and ECG Interpretation: labs, ECG and x-ray    Date: 08/25/2012  Rate: 76  Rhythm: normal sinus rhythm  QRS Axis: normal   Intervals: normal  ST/T Wave abnormalities: normal  Conduction Disutrbances:none  Narrative Interpretation:   Old EKG Reviewed: unchanged; no significant changes from previous EKG dated 05/26/2012.  Results for orders placed during the hospital encounter of 08/25/12  CBC WITH DIFFERENTIAL      Result Value Range   WBC 4.9  4.0 - 10.5 K/uL   RBC 4.29  3.87 - 5.11 MIL/uL   Hemoglobin 11.7 (*) 12.0 - 15.0 g/dL   HCT 16.1  09.6 - 04.5 %   MCV 83.9  78.0 - 100.0 fL   MCH 27.3  26.0 - 34.0 pg   MCHC 32.5  30.0 - 36.0 g/dL   RDW 40.9  81.1 - 91.4 %   Platelets 292  150 - 400 K/uL   Neutrophils Relative % 51  43 - 77 %   Neutro Abs 2.5  1.7 - 7.7 K/uL   Lymphocytes Relative 33  12 - 46 %   Lymphs Abs 1.6  0.7 - 4.0 K/uL   Monocytes Relative 8  3 - 12 %   Monocytes Absolute 0.4  0.1 - 1.0 K/uL   Eosinophils Relative 8 (*) 0 - 5 %   Eosinophils Absolute 0.4  0.0 - 0.7 K/uL   Basophils Relative 1  0 - 1 %   Basophils Absolute 0.0  0.0 - 0.1 K/uL  BASIC METABOLIC PANEL      Result Value Range   Sodium 138  135 - 145 mEq/L   Potassium 3.8  3.5 - 5.1 mEq/L   Chloride 104  96 - 112 mEq/L   CO2 27  19 - 32 mEq/L   Glucose, Bld 113 (*) 70 - 99 mg/dL   BUN 13  6 - 23 mg/dL   Creatinine, Ser 7.82 (*) 0.50 - 1.10 mg/dL   Calcium 9.0  8.4 - 95.6 mg/dL   GFR calc non Af Amer 59 (*) >90 mL/min   GFR calc Af Amer 69 (*) >90 mL/min  TROPONIN I      Result Value Range   Troponin I <0.30  <0.30 ng/mL  D-DIMER, QUANTITATIVE      Result Value Range   D-Dimer, Quant 0.39  0.00 - 0.48 ug/mL-FEU   Dg Chest 2 View 08/25/2012   *RADIOLOGY REPORT*  Clinical Data: Shortness of breath and back pain.  CHEST - 2 VIEW  Comparison: PA and lateral chest 07/24/2012 and 12/12/2011.  Findings: The lungs are clear.  Heart size is normal.  No pneumothorax or pleural effusion.  IMPRESSION: Negative chest.   Original Report Authenticated By: Holley Dexter, M.D.    [phone]:  Improved after meds and wants to go home  now. No acute findings on workup today. Will tx for msk pain. Dx and testing d/w pt and family.  Questions answered.  Verb understanding, agreeable to d/c home with outpt f/u.    Laray Anger, DO 08/28/12 [phone]

## 2012-08-25 NOTE — ED Notes (Signed)
nad noted prior to dc. Dc instructions reviewed and explained. 3 scripts given to pt. Pt voiced understanding.

## 2012-09-28 ENCOUNTER — Emergency Department (HOSPITAL_COMMUNITY)
Admission: EM | Admit: 2012-09-28 | Discharge: 2012-09-28 | Disposition: A | Discharge status: Home / Self Care | Payer: Self-pay | Attending: Emergency Medicine | Admitting: Emergency Medicine

## 2012-09-28 ENCOUNTER — Encounter (HOSPITAL_COMMUNITY): Payer: Self-pay | Admitting: *Deleted

## 2012-09-28 DIAGNOSIS — M25579 Pain in unspecified ankle and joints of unspecified foot: Secondary | ICD-10-CM | POA: Insufficient documentation

## 2012-09-28 DIAGNOSIS — H53149 Visual discomfort, unspecified: Secondary | ICD-10-CM | POA: Insufficient documentation

## 2012-09-28 DIAGNOSIS — R609 Edema, unspecified: Secondary | ICD-10-CM | POA: Insufficient documentation

## 2012-09-28 DIAGNOSIS — Z8541 Personal history of malignant neoplasm of cervix uteri: Secondary | ICD-10-CM | POA: Insufficient documentation

## 2012-09-28 DIAGNOSIS — M25476 Effusion, unspecified foot: Secondary | ICD-10-CM | POA: Insufficient documentation

## 2012-09-28 DIAGNOSIS — M549 Dorsalgia, unspecified: Secondary | ICD-10-CM | POA: Insufficient documentation

## 2012-09-28 DIAGNOSIS — M25473 Effusion, unspecified ankle: Secondary | ICD-10-CM | POA: Insufficient documentation

## 2012-09-28 DIAGNOSIS — G43909 Migraine, unspecified, not intractable, without status migrainosus: Secondary | ICD-10-CM | POA: Insufficient documentation

## 2012-09-28 DIAGNOSIS — R110 Nausea: Secondary | ICD-10-CM | POA: Insufficient documentation

## 2012-09-28 MED ORDER — KETOROLAC TROMETHAMINE 30 MG/ML IJ SOLN
30.0000 mg | Freq: Once | INTRAMUSCULAR | Status: AC
  Administered 2012-09-28: 30 mg via INTRAVENOUS
  Filled 2012-09-28: qty 1

## 2012-09-28 MED ORDER — SODIUM CHLORIDE 0.9 % IV BOLUS (SEPSIS)
1000.0000 mL | Freq: Once | INTRAVENOUS | Status: AC
  Administered 2012-09-28: 1000 mL via INTRAVENOUS

## 2012-09-28 MED ORDER — METOCLOPRAMIDE HCL 5 MG/ML IJ SOLN
10.0000 mg | Freq: Once | INTRAMUSCULAR | Status: AC
  Administered 2012-09-28: 10 mg via INTRAVENOUS
  Filled 2012-09-28: qty 2

## 2012-09-28 NOTE — ED Provider Notes (Signed)
CSN: [id number]     Arrival date & time 09/28/12  1809 History   First MD Initiated Contact with Patient 09/28/12 1937     Chief Complaint  Patient presents with  . Migraine  . Joint Swelling   (Consider location/radiation/quality/duration/timing/severity/associated sxs/prior Treatment) Patient is a 42 y.o. female presenting with migraines.  Migraine   Pt with history of migraines reports severe diffuse headache all day today associated with photophobia and nausea similar to previous and not improved with hydrocodone she has had home from recent ED visit for back pain.   She has a secondary complaint of bilateral ankle pain and swelling since earlier today improved since she has been in the ED lying down.  Past Medical History  Diagnosis Date  . Left knee pain   . Migraine   . Cancer     cervical cx at age 28   Past Surgical History  Procedure Laterality Date  . Tubal ligation     Family History  Problem Relation Age of Onset  . Cancer Mother    History  Substance Use Topics  . Smoking status: Never Smoker   . Smokeless tobacco: Never Used  . Alcohol Use: No   OB History   Grav Para Term Preterm Abortions TAB SAB Ect Mult Living                 Review of Systems All other systems reviewed and are negative except as noted in HPI.   Allergies  Benadryl and Onion  Home Medications  No current outpatient prescriptions on file. BP 115/77  Pulse 93  Temp(Src) 98.4 F (36.9 C) (Oral)  Resp 18  Ht 5\' 4"  (1.626 m)  Wt 251 lb (113.853 kg)  BMI 43.06 kg/m2  SpO2 98%  LMP 09/06/2012 Physical Exam  Nursing note and vitals reviewed. Constitutional: She is oriented to person, place, and time. She appears well-developed and well-nourished.  HENT:  Head: Normocephalic and atraumatic.  Eyes: EOM are normal. Pupils are equal, round, and reactive to light.  Neck: Normal range of motion. Neck supple.  Cardiovascular: Normal rate, normal heart sounds and intact distal  pulses.   Pulmonary/Chest: Effort normal and breath sounds normal.  Abdominal: Bowel sounds are normal. She exhibits no distension. There is no tenderness.  Musculoskeletal: Normal range of motion. She exhibits no edema and no tenderness.  Neurological: She is alert and oriented to person, place, and time. She has normal strength. No cranial nerve deficit or sensory deficit.  Skin: Skin is warm and dry. No rash noted.  Psychiatric: She has a normal mood and affect.    ED Course  Procedures (including critical care time) Labs Review Labs Reviewed - No data to display Imaging Review No results found.  MDM   1. Migraine   2. Peripheral edema     LE exam normal, possibly having some venous stasis swelling from standing but no indication for further ED workup of reported peripheral edema. IVF and migraine cocktail.   9:09 PM Pt sleeping soundly. IVF infusion about halfway done. Anticipate discharge when complete.   Charles B. Bernette Mayers, MD 09/28/12 2140

## 2012-09-28 NOTE — ED Notes (Signed)
Migraine onset last night. Not on any rx meds for migraines.  This a.m. Noticed bilateral ankle swelling and pain.

## 2012-09-28 NOTE — ED Notes (Signed)
Pt complaining of nausea when light is on.

## 2012-11-25 ENCOUNTER — Encounter (HOSPITAL_COMMUNITY): Payer: Self-pay | Admitting: Emergency Medicine

## 2012-11-25 ENCOUNTER — Emergency Department (HOSPITAL_COMMUNITY)
Admission: EM | Admit: 2012-11-25 | Discharge: 2012-11-25 | Disposition: A | Discharge status: Home / Self Care | Payer: Self-pay | Attending: Emergency Medicine | Admitting: Emergency Medicine

## 2012-11-25 ENCOUNTER — Emergency Department (HOSPITAL_COMMUNITY): Payer: Self-pay

## 2012-11-25 DIAGNOSIS — Z8679 Personal history of other diseases of the circulatory system: Secondary | ICD-10-CM | POA: Insufficient documentation

## 2012-11-25 DIAGNOSIS — R079 Chest pain, unspecified: Secondary | ICD-10-CM

## 2012-11-25 DIAGNOSIS — R0602 Shortness of breath: Secondary | ICD-10-CM | POA: Insufficient documentation

## 2012-11-25 DIAGNOSIS — R059 Cough, unspecified: Secondary | ICD-10-CM | POA: Insufficient documentation

## 2012-11-25 DIAGNOSIS — M7989 Other specified soft tissue disorders: Secondary | ICD-10-CM | POA: Insufficient documentation

## 2012-11-25 DIAGNOSIS — M549 Dorsalgia, unspecified: Secondary | ICD-10-CM | POA: Insufficient documentation

## 2012-11-25 DIAGNOSIS — H538 Other visual disturbances: Secondary | ICD-10-CM | POA: Insufficient documentation

## 2012-11-25 DIAGNOSIS — R05 Cough: Secondary | ICD-10-CM | POA: Insufficient documentation

## 2012-11-25 DIAGNOSIS — M25579 Pain in unspecified ankle and joints of unspecified foot: Secondary | ICD-10-CM | POA: Insufficient documentation

## 2012-11-25 DIAGNOSIS — R6883 Chills (without fever): Secondary | ICD-10-CM | POA: Insufficient documentation

## 2012-11-25 DIAGNOSIS — R61 Generalized hyperhidrosis: Secondary | ICD-10-CM | POA: Insufficient documentation

## 2012-11-25 DIAGNOSIS — R0789 Other chest pain: Secondary | ICD-10-CM | POA: Insufficient documentation

## 2012-11-25 DIAGNOSIS — Z8541 Personal history of malignant neoplasm of cervix uteri: Secondary | ICD-10-CM | POA: Insufficient documentation

## 2012-11-25 DIAGNOSIS — M79609 Pain in unspecified limb: Secondary | ICD-10-CM | POA: Insufficient documentation

## 2012-11-25 LAB — CBC WITH DIFFERENTIAL/PLATELET
Basophils Absolute: 0 10*3/uL (ref 0.0–0.1)
Basophils Relative: 1 % (ref 0–1)
Lymphocytes Relative: 33 % (ref 12–46)
MCHC: 32.8 g/dL (ref 30.0–36.0)
Neutro Abs: 3.6 10*3/uL (ref 1.7–7.7)
Neutrophils Relative %: 57 % (ref 43–77)
Platelets: 296 10*3/uL (ref 150–400)
RDW: 15.5 % (ref 11.5–15.5)
WBC: 6.3 10*3/uL (ref 4.0–10.5)

## 2012-11-25 LAB — BASIC METABOLIC PANEL WITH GFR
BUN: 14 mg/dL (ref 6–23)
CO2: 26 meq/L (ref 19–32)
Calcium: 9 mg/dL (ref 8.4–10.5)
Chloride: 102 meq/L (ref 96–112)
Creatinine, Ser: 1.11 mg/dL — ABNORMAL HIGH (ref 0.50–1.10)
GFR calc Af Amer: 69 mL/min — ABNORMAL LOW
GFR calc non Af Amer: 59 mL/min — ABNORMAL LOW
Glucose, Bld: 93 mg/dL (ref 70–99)
Potassium: 3.7 meq/L (ref 3.5–5.1)
Sodium: 137 meq/L (ref 135–145)

## 2012-11-25 LAB — TROPONIN I
Troponin I: <0.3 ng/mL (ref <0.30)
Troponin I: <0.3 ng/mL (ref <0.30)

## 2012-11-25 MED ORDER — ASPIRIN 81 MG PO CHEW
324.0000 mg | CHEWABLE_TABLET | Freq: Once | ORAL | Status: AC
  Administered 2012-11-25: 324 mg via ORAL
  Filled 2012-11-25: qty 4

## 2012-11-25 MED ORDER — HYDROCODONE-ACETAMINOPHEN 5-325 MG PO TABS: 1.0000 | ORAL_TABLET | Freq: Four times a day (QID) | ORAL | Status: DC | PRN

## 2012-11-25 MED ORDER — HYDROCODONE-ACETAMINOPHEN 5-325 MG PO TABS
1.0000 | ORAL_TABLET | Freq: Once | ORAL | Status: AC
  Administered 2012-11-25: 1 via ORAL
  Filled 2012-11-25: qty 1

## 2012-11-25 MED ORDER — ONDANSETRON 4 MG PO TBDP
4.0000 mg | ORAL_TABLET | Freq: Once | ORAL | Status: AC
  Administered 2012-11-25: 4 mg via ORAL
  Filled 2012-11-25: qty 1

## 2012-11-25 MED ORDER — ASPIRIN 81 MG PO CHEW: 81.0000 mg | CHEWABLE_TABLET | Freq: Every day | ORAL | Status: DC

## 2012-11-25 NOTE — ED Notes (Signed)
Has chronic back pain,  2 days ago bilateral leg pain. C/o numbness in both lower legs.

## 2012-11-25 NOTE — ED Provider Notes (Signed)
CSN: [id number]     Arrival date & time 11/25/12  1232 History  This chart was scribed for Shelda Jakes, MD by Quintella Reichert, ED scribe.  This patient was seen in room APA01/APA01 and the patient's care was started at 1:23 PM.   Chief Complaint  Patient presents with  . Chest Pain    Patient is a 42 y.o. female presenting with chest pain. The history is provided by the patient. No language interpreter was used.  Chest Pain Pain location:  L chest Pain quality: sharp   Pain quality comment:  Squeezing Pain radiates to the back: yes   Duration:  2 days Timing:  Intermittent Progression:  Worsening Chronicity:  Recurrent (pt had similar symptoms several months ago, no cardiac cause found in ED) Relieved by: sitting up. Exacerbated by: laying flat. Associated symptoms: back pain, cough, diaphoresis (at night) and shortness of breath   Associated symptoms: no abdominal pain, no fever, no headache, no nausea and not vomiting     HPI Comments: Kristi Martin is a 42 y.o. female who presents to the Emergency Department complaining of 2 days of left-sided chest pain with associated SOB.  Pain radiates from under her left breast to her back.  Pt states that during the day her pain was initially coming and going in intermittent "sharp" 10/10 pain lasting approximately 2 minutes and she had to "catch my breath."  At night she has had a constant 4/10 "squeezing" pain.  Pain is also associated with diaphoresis at night.  Today her pain has been constant.  Pain is worsened by lying flat and is relieved by sitting up.  It is rated as a 10/10 "sharp" pain at its worst and a 4/10 "squeezing" pain presently.  Pt admits to prior h/o similar but less severe symptoms.  She was seen in the ED last time and evaluation was negative for any emergent conditions.  Pt also complains of bilateral foot pain, swelling in bilateral ankles, chills, blurred vision, and a mild cough.   Ankle swelling is chronic but  has recently spread to her right ankle.  She denies fever, abdominal pain, nausea, vomiting, diarrhea, dysuria, or any other assocaited symptoms.  Pt has no PCP   Past Medical History  Diagnosis Date  . Left knee pain   . Migraine   . Cancer     cervical cx at age 66    Past Surgical History  Procedure Laterality Date  . Tubal ligation      Family History  Problem Relation Age of Onset  . Cancer Mother     History  Substance Use Topics  . Smoking status: Never Smoker   . Smokeless tobacco: Never Used  . Alcohol Use: No    OB History   Grav Para Term Preterm Abortions TAB SAB Ect Mult Living   10 7              Review of Systems  Constitutional: Positive for chills and diaphoresis (at night). Negative for fever.  Eyes: Positive for visual disturbance.  Respiratory: Positive for cough and shortness of breath.   Cardiovascular: Positive for chest pain and leg swelling.  Gastrointestinal: Negative for nausea, vomiting, abdominal pain and diarrhea.  Genitourinary: Negative for dysuria.  Musculoskeletal: Positive for arthralgias and back pain. Negative for neck pain.  Skin: Negative for rash.  Neurological: Negative for headaches.  Hematological: Does not bruise/bleed easily.  Psychiatric/Behavioral: Negative for confusion.     Allergies  Benadryl  and Onion  Home Medications   Current Outpatient Rx  Name  Route  Sig  Dispense  Refill  . acetaminophen (TYLENOL) 650 MG CR tablet   Oral   Take 1,300 mg by mouth every 8 (eight) hours as needed for pain.         Marland Kitchen ibuprofen (ADVIL,MOTRIN) 200 MG tablet   Oral   Take 1,000 mg by mouth every 6 (six) hours as needed for pain.          BP 127/105  Pulse 95  Temp(Src) 97.7 F (36.5 C)  Resp 18  Ht 5\' 4"  (1.626 m)  Wt 251 lb (113.853 kg)  BMI 43.06 kg/m2  SpO2 96%  LMP 11/18/2012  Physical Exam  Nursing note and vitals reviewed. Constitutional: She is oriented to person, place, and time. She appears  well-developed and well-nourished. No distress.  HENT:  Head: Normocephalic and atraumatic.  Mouth/Throat: Oropharynx is clear and moist and mucous membranes are normal.  Eyes: Conjunctivae and EOM are normal. Pupils are equal, round, and reactive to light. No scleral icterus.  Neck: Neck supple. No tracheal deviation present.  Cardiovascular: Normal rate, regular rhythm and normal heart sounds.   No murmur heard. Capillary refill normal in toes  Pulmonary/Chest: Effort normal and breath sounds normal. No respiratory distress. She has no wheezes. She has no rales.  Abdominal: Soft. Bowel sounds are normal. There is no tenderness. There is no rebound and no guarding.  Musculoskeletal: Normal range of motion.  No pitting edema  Neurological: She is alert and oriented to person, place, and time. No cranial nerve deficit.  Skin: Skin is warm and dry.  Psychiatric: She has a normal mood and affect. Her behavior is normal.    ED Course  Procedures (including critical care time)  DIAGNOSTIC STUDIES: Oxygen Saturation is 96% on room air, normal by my interpretation.    COORDINATION OF CARE: 1:28 PM: Discussed treatment plan which includes cardiac workup.  Pt expressed understanding and agreed to plan.   Labs Review Labs Reviewed  CBC WITH DIFFERENTIAL - Abnormal; Notable for the following:    Hemoglobin 11.3 (*)    HCT 34.4 (*)    All other components within normal limits  BASIC METABOLIC PANEL - Abnormal; Notable for the following:    Creatinine, Ser 1.11 (*)    GFR calc non Af Amer 59 (*)    GFR calc Af Amer 69 (*)    All other components within normal limits  TROPONIN I  D-DIMER, QUANTITATIVE  TROPONIN I   Results for orders placed during the hospital encounter of 11/25/12  CBC WITH DIFFERENTIAL      Result Value Range   WBC 6.3  4.0 - 10.5 K/uL   RBC 4.12  3.87 - 5.11 MIL/uL   Hemoglobin 11.3 (*) 12.0 - 15.0 g/dL   HCT 19.1 (*) 47.8 - 29.5 %   MCV 83.5  78.0 - 100.0 fL    MCH 27.4  26.0 - 34.0 pg   MCHC 32.8  30.0 - 36.0 g/dL   RDW 62.1  30.8 - 65.7 %   Platelets 296  150 - 400 K/uL   Neutrophils Relative % 57  43 - 77 %   Neutro Abs 3.6  1.7 - 7.7 K/uL   Lymphocytes Relative 33  12 - 46 %   Lymphs Abs 2.1  0.7 - 4.0 K/uL   Monocytes Relative 9  3 - 12 %   Monocytes Absolute 0.5  0.1 -  1.0 K/uL   Eosinophils Relative 1  0 - 5 %   Eosinophils Absolute 0.1  0.0 - 0.7 K/uL   Basophils Relative 1  0 - 1 %   Basophils Absolute 0.0  0.0 - 0.1 K/uL  BASIC METABOLIC PANEL      Result Value Range   Sodium 137  135 - 145 mEq/L   Potassium 3.7  3.5 - 5.1 mEq/L   Chloride 102  96 - 112 mEq/L   CO2 26  19 - 32 mEq/L   Glucose, Bld 93  70 - 99 mg/dL   BUN 14  6 - 23 mg/dL   Creatinine, Ser 4.54 (*) 0.50 - 1.10 mg/dL   Calcium 9.0  8.4 - 09.8 mg/dL   GFR calc non Af Amer 59 (*) >90 mL/min   GFR calc Af Amer 69 (*) >90 mL/min  TROPONIN I      Result Value Range   Troponin I <0.30  <0.30 ng/mL  D-DIMER, QUANTITATIVE      Result Value Range   D-Dimer, Quant <0.27  0.00 - 0.48 ug/mL-FEU  TROPONIN I      Result Value Range   Troponin I <0.30  <0.30 ng/mL    Imaging Review Dg Chest Portable 1 View  11/25/2012   CLINICAL DATA:  Chest pain  EXAM: PORTABLE CHEST - 1 VIEW  COMPARISON:  August 25, 2012  FINDINGS: The lungs are clear. The heart size and pulmonary vascularity are normal. No adenopathy. No pneumothorax. No bone lesions.  IMPRESSION: No abnormality noted.   Electronically Signed   By: Bretta Bang M.D.   On: 11/25/2012 13:26    EKG Interpretation     Ventricular Rate:  77 PR Interval:  178 QRS Duration: 84 QT Interval:  408 QTC Calculation: 461 R Axis:   46 Text Interpretation:  Normal sinus rhythm Normal ECG When compared with ECG of 25-Aug-2012 13:03, No significant change was found            MDM   1. Chest pain    Noncardiac chest pain. Patient with troponin x2 negative EKG without any acute findings. Chest x-ray negative  for pneumonia pulmonary edema or pneumothorax. D-dimer was also negative not consistent with pulmonary embolus. Patient's had similar pain in the past suspect this is noncardiac etiology patient does not have a primary care Dr. will give her resource guide to help her followup and will recommend starting to take baby aspirin a day.   I personally performed the services described in this documentation, which was scribed in my presence. The recorded information has been reviewed and is accurate.     Shelda Jakes, MD 11/25/12 [phone]

## 2012-11-25 NOTE — ED Notes (Signed)
Pt states her feet are sensitive to the touch.  Pt has a moderate pulse in bilateral feet.

## 2012-11-25 NOTE — ED Notes (Signed)
Also c/o chest pain which started last night

## 2012-11-25 NOTE — ED Notes (Signed)
Pt placed on 2L nasal canula  

## 2012-11-25 NOTE — Progress Notes (Signed)
ED/CM noted patient did not have health insurance and/or PCP listed in the computer.  Patient was given the [place] resource handout with information on the clinics, food pantries, and the handout for new health insurance sign-up.  Patient expressed appreciation for this. 

## 2013-01-12 ENCOUNTER — Encounter (HOSPITAL_COMMUNITY): Payer: Self-pay | Admitting: Emergency Medicine

## 2013-01-12 ENCOUNTER — Emergency Department (HOSPITAL_COMMUNITY)
Admission: EM | Admit: 2013-01-12 | Discharge: 2013-01-12 | Disposition: A | Discharge status: Home / Self Care | Payer: Self-pay | Attending: Emergency Medicine | Admitting: Emergency Medicine

## 2013-01-12 ENCOUNTER — Other Ambulatory Visit: Payer: Self-pay

## 2013-01-12 ENCOUNTER — Emergency Department (HOSPITAL_COMMUNITY): Payer: Self-pay

## 2013-01-12 DIAGNOSIS — Z7982 Long term (current) use of aspirin: Secondary | ICD-10-CM | POA: Insufficient documentation

## 2013-01-12 DIAGNOSIS — Z8541 Personal history of malignant neoplasm of cervix uteri: Secondary | ICD-10-CM | POA: Insufficient documentation

## 2013-01-12 DIAGNOSIS — Z792 Long term (current) use of antibiotics: Secondary | ICD-10-CM | POA: Insufficient documentation

## 2013-01-12 DIAGNOSIS — Z8739 Personal history of other diseases of the musculoskeletal system and connective tissue: Secondary | ICD-10-CM | POA: Insufficient documentation

## 2013-01-12 DIAGNOSIS — K8020 Calculus of gallbladder without cholecystitis without obstruction: Secondary | ICD-10-CM | POA: Insufficient documentation

## 2013-01-12 DIAGNOSIS — Z8679 Personal history of other diseases of the circulatory system: Secondary | ICD-10-CM | POA: Insufficient documentation

## 2013-01-12 DIAGNOSIS — Z79899 Other long term (current) drug therapy: Secondary | ICD-10-CM | POA: Insufficient documentation

## 2013-01-12 LAB — CBC WITH DIFFERENTIAL/PLATELET
Basophils Absolute: 0 10*3/uL (ref 0.0–0.1)
Basophils Relative: 0 % (ref 0–1)
Eosinophils Relative: 1 % (ref 0–5)
HCT: 36.2 % (ref 36.0–46.0)
Lymphocytes Relative: 37 % (ref 12–46)
MCHC: 32.6 g/dL (ref 30.0–36.0)
MCV: 83 fL (ref 78.0–100.0)
Monocytes Absolute: 0.5 10*3/uL (ref 0.1–1.0)
Neutrophils Relative %: 52 % (ref 43–77)
Platelets: 305 10*3/uL (ref 150–400)
RBC: 4.36 MIL/uL (ref 3.87–5.11)
RDW: 14.5 % (ref 11.5–15.5)

## 2013-01-12 LAB — COMPREHENSIVE METABOLIC PANEL
AST: 15 U/L (ref 0–37)
Albumin: 3.4 g/dL — ABNORMAL LOW (ref 3.5–5.2)
Calcium: 8.8 mg/dL (ref 8.4–10.5)
Creatinine, Ser: 1.3 mg/dL — ABNORMAL HIGH (ref 0.50–1.10)
GFR calc Af Amer: 57 mL/min — ABNORMAL LOW (ref >90)
Total Bilirubin: 0.3 mg/dL (ref 0.3–1.2)
Total Protein: 7.4 g/dL (ref 6.0–8.3)

## 2013-01-12 LAB — LIPASE, BLOOD: Lipase: 24 U/L (ref 11–59)

## 2013-01-12 MED ORDER — HYDROMORPHONE HCL PF 1 MG/ML IJ SOLN
1.0000 mg | Freq: Once | INTRAMUSCULAR | Status: AC
  Administered 2013-01-12: 1 mg via INTRAVENOUS
  Filled 2013-01-12: qty 1

## 2013-01-12 MED ORDER — SODIUM CHLORIDE 0.9 % IV SOLN
1000.0000 mL | Freq: Once | INTRAVENOUS | Status: AC
  Administered 2013-01-12: 1000 mL via INTRAVENOUS

## 2013-01-12 MED ORDER — CIPROFLOXACIN HCL 500 MG PO TABS: 500.0000 mg | ORAL_TABLET | Freq: Two times a day (BID) | ORAL | Status: DC

## 2013-01-12 MED ORDER — CIPROFLOXACIN HCL 250 MG PO TABS
500.0000 mg | ORAL_TABLET | Freq: Once | ORAL | Status: AC
  Administered 2013-01-12: 500 mg via ORAL
  Filled 2013-01-12: qty 2

## 2013-01-12 MED ORDER — OXYCODONE-ACETAMINOPHEN 5-325 MG PO TABS: 1.0000 | ORAL_TABLET | ORAL | Status: DC | PRN

## 2013-01-12 MED ORDER — IBUPROFEN 600 MG PO TABS: 600.0000 mg | ORAL_TABLET | Freq: Three times a day (TID) | ORAL | Status: DC | PRN

## 2013-01-12 MED ORDER — ONDANSETRON HCL 4 MG/2ML IJ SOLN
4.0000 mg | Freq: Once | INTRAMUSCULAR | Status: AC
  Administered 2013-01-12: 4 mg via INTRAVENOUS
  Filled 2013-01-12: qty 2

## 2013-01-12 MED ORDER — PROMETHAZINE HCL 25 MG PO TABS: 25.0000 mg | ORAL_TABLET | Freq: Four times a day (QID) | ORAL | Status: DC | PRN

## 2013-01-12 MED ORDER — SODIUM CHLORIDE 0.9 % IV SOLN
1000.0000 mL | INTRAVENOUS | Status: DC
Start: 2013-01-12 — End: 2013-01-12
  Administered 2013-01-12: 1000 mL via INTRAVENOUS

## 2013-01-12 NOTE — ED Notes (Signed)
Pt reports right upper quad ab pain since 11/26. [place] and was unable to make it into the building. Pain is severe at present. Pain is constant, has been taking tylenol for the pain, pain is worse after eating.  Vomiting at times.  No fever. Diarrhea that started that started yesterday.  Pt alert and active. Has not been to the doctor, has been waiting on her orange card.

## 2013-01-12 NOTE — ED Provider Notes (Signed)
CSN: [id number]     Arrival date & time 01/12/13  4098 History  This chart was scribed for Lyanne Co, MD by Quintella Reichert, ED scribe.  This patient was seen in room APA19/APA19 and the patient's care was started at 9:44 AM.   Chief Complaint  Patient presents with  . Abdominal Pain    The history is provided by the patient. No language interpreter was used.    HPI Comments: Kristi Martin is a 42 y.o. female who presents to the Emergency Department complaining of several weeks of persistent, worsening, severe RUQ abdominal pain worsened by eating.  Pt states that her pain has been occurring off and on since 11/26.  For the past 3 days pain has been growing much more severe and has been constant.  She states pain is always present currently but is greatly worsened whenever she eats, and the type of food does not seem to matter.  Pain is associated with nausea and vomiting and she states that last night she vomited a large amount.  Pt denies urinary symptoms.  She denies h/o cholecystectomy or diagnosed gallstones.        Past Medical History  Diagnosis Date  . Left knee pain   . Migraine   . Cancer     cervical cx at age 26    Past Surgical History  Procedure Laterality Date  . Tubal ligation      Family History  Problem Relation Age of Onset  . Cancer Mother     History  Substance Use Topics  . Smoking status: Never Smoker   . Smokeless tobacco: Never Used  . Alcohol Use: No    OB History   Grav Para Term Preterm Abortions TAB SAB Ect Mult Living   10 7              Review of Systems A complete 10 system review of systems was obtained and all systems are negative except as noted in the HPI and PMH.    Allergies  Benadryl and Onion  Home Medications   Current Outpatient Rx  Name  Route  Sig  Dispense  Refill  . acetaminophen (TYLENOL) 650 MG CR tablet   Oral   Take 650 mg by mouth every 8 (eight) hours as needed for pain.          Marland Kitchen aspirin 81  MG chewable tablet   Oral   Chew 1 tablet (81 mg total) by mouth daily.   30 tablet   3   . calcium elemental as carbonate (TUMS ULTRA 1000) 400 MG tablet   Oral   Chew 1,000 mg by mouth 3 (three) times daily.         . ciprofloxacin (CIPRO) 500 MG tablet   Oral   Take 1 tablet (500 mg total) by mouth 2 (two) times daily.   20 tablet   0   . ibuprofen (ADVIL,MOTRIN) 600 MG tablet   Oral   Take 1 tablet (600 mg total) by mouth every 8 (eight) hours as needed.   15 tablet   0   . oxyCODONE-acetaminophen (PERCOCET/ROXICET) 5-325 MG per tablet   Oral   Take 1 tablet by mouth every 4 (four) hours as needed for severe pain.   20 tablet   0   . promethazine (PHENERGAN) 25 MG tablet   Oral   Take 1 tablet (25 mg total) by mouth every 6 (six) hours as needed for nausea or vomiting.  20 tablet   0    BP 135/103  Pulse 98  Temp(Src) 98.4 F (36.9 C) (Oral)  Resp 24  Ht 5\' 6"  (1.676 m)  Wt 250 lb (113.399 kg)  BMI 40.37 kg/m2  SpO2 100%  LMP 01/03/2013  Physical Exam  Nursing note and vitals reviewed. Constitutional: She is oriented to person, place, and time. She appears well-developed and well-nourished. No distress.  HENT:  Head: Normocephalic and atraumatic.  Eyes: EOM are normal.  Neck: Normal range of motion.  Cardiovascular: Normal rate, regular rhythm and normal heart sounds.   Pulmonary/Chest: Effort normal and breath sounds normal.  Abdominal: Soft. She exhibits no distension. There is tenderness.  Mild RUQ tenderness  Musculoskeletal: Normal range of motion.  Neurological: She is alert and oriented to person, place, and time.  Skin: Skin is warm and dry.  Psychiatric: She has a normal mood and affect. Judgment normal.    ED Course  Procedures (including critical care time)  DIAGNOSTIC STUDIES: Oxygen Saturation is 100% on room air, normal by my interpretation.    COORDINATION OF CARE: 9:49 AM-Discussed treatment plan which includes pain  medication, anti-emetics, labs, and US abdomen with pt at bedside and pt agreed to plan.    Labs Review Labs Reviewed  CBC WITH DIFFERENTIAL - Abnormal; Notable for the following:    Hemoglobin 11.8 (*)    All other components within normal limits  COMPREHENSIVE METABOLIC PANEL - Abnormal; Notable for the following:    Potassium 3.4 (*)    Creatinine, Ser 1.30 (*)    Albumin 3.4 (*)    GFR calc non Af Amer 49 (*)    GFR calc Af Amer 57 (*)    All other components within normal limits  LIPASE, BLOOD    Imaging Review No results found.I personally reviewed the imaging tests through PACS system I reviewed available ER/hospitalization records through the EMR   EKG Interpretation   None       MDM   1. Cholelithiasis    i discussed her case with on call surgeon Dr Franky Macho, who asks that the patient come to his office tomorrow.  He agrees with outpatient antibiotics a request ciprofloxacin.  I think this is a reasonable request as the patient's case is equivocal at this point.    I personally performed the services described in this documentation, which was scribed in my presence. The recorded information has been reviewed and is accurate.      Lyanne Co, MD 01/15/13 [phone]

## 2013-01-19 ENCOUNTER — Observation Stay (HOSPITAL_COMMUNITY)
Admission: EM | Admit: 2013-01-19 | Discharge: 2013-01-21 | Disposition: A | Discharge status: Home / Self Care | Payer: Self-pay | Attending: General Surgery | Admitting: General Surgery

## 2013-01-19 ENCOUNTER — Emergency Department (HOSPITAL_COMMUNITY): Payer: Self-pay

## 2013-01-19 ENCOUNTER — Encounter (HOSPITAL_COMMUNITY): Payer: Self-pay | Admitting: Emergency Medicine

## 2013-01-19 DIAGNOSIS — K660 Peritoneal adhesions (postprocedural) (postinfection): Secondary | ICD-10-CM | POA: Insufficient documentation

## 2013-01-19 DIAGNOSIS — K8010 Calculus of gallbladder with chronic cholecystitis without obstruction: Principal | ICD-10-CM | POA: Insufficient documentation

## 2013-01-19 DIAGNOSIS — K8000 Calculus of gallbladder with acute cholecystitis without obstruction: Secondary | ICD-10-CM | POA: Insufficient documentation

## 2013-01-19 DIAGNOSIS — Z8541 Personal history of malignant neoplasm of cervix uteri: Secondary | ICD-10-CM | POA: Insufficient documentation

## 2013-01-19 DIAGNOSIS — K8066 Calculus of gallbladder and bile duct with acute and chronic cholecystitis without obstruction: Secondary | ICD-10-CM

## 2013-01-19 DIAGNOSIS — K8020 Calculus of gallbladder without cholecystitis without obstruction: Secondary | ICD-10-CM | POA: Diagnosis present

## 2013-01-19 DIAGNOSIS — R110 Nausea: Secondary | ICD-10-CM | POA: Insufficient documentation

## 2013-01-19 HISTORY — DX: Malignant neoplasm of cervix uteri, unspecified: C53.9

## 2013-01-19 LAB — COMPREHENSIVE METABOLIC PANEL
AST: 15 U/L (ref 0–37)
BUN: 10 mg/dL (ref 6–23)
CO2: 24 mEq/L (ref 19–32)
Calcium: 9.5 mg/dL (ref 8.4–10.5)
Creatinine, Ser: 1.2 mg/dL — ABNORMAL HIGH (ref 0.50–1.10)
GFR calc Af Amer: 63 mL/min — ABNORMAL LOW (ref >90)
GFR calc non Af Amer: 54 mL/min — ABNORMAL LOW (ref >90)
Potassium: 3.8 mEq/L (ref 3.5–5.1)
Sodium: 139 mEq/L (ref 135–145)
Total Bilirubin: 0.2 mg/dL — ABNORMAL LOW (ref 0.3–1.2)

## 2013-01-19 LAB — CBC WITH DIFFERENTIAL/PLATELET
Basophils Relative: 1 % (ref 0–1)
Eosinophils Relative: 3 % (ref 0–5)
HCT: 35.9 % — ABNORMAL LOW (ref 36.0–46.0)
Lymphocytes Relative: 37 % (ref 12–46)
Lymphs Abs: 1.8 10*3/uL (ref 0.7–4.0)
MCV: 84.1 fL (ref 78.0–100.0)
Monocytes Absolute: 0.5 10*3/uL (ref 0.1–1.0)
Neutro Abs: 2.3 10*3/uL (ref 1.7–7.7)
Neutrophils Relative %: 48 % (ref 43–77)
Platelets: 313 10*3/uL (ref 150–400)
RBC: 4.27 MIL/uL (ref 3.87–5.11)
RDW: 14.6 % (ref 11.5–15.5)
WBC: 4.8 10*3/uL (ref 4.0–10.5)

## 2013-01-19 LAB — CG4 I-STAT (LACTIC ACID): Lactic Acid, Venous: 3.16 mmol/L — ABNORMAL HIGH (ref 0.5–2.2)

## 2013-01-19 LAB — LIPASE, BLOOD: Lipase: 47 U/L (ref 11–59)

## 2013-01-19 MED ORDER — SODIUM CHLORIDE 0.9 % IV BOLUS (SEPSIS)
1000.0000 mL | Freq: Once | INTRAVENOUS | Status: AC
  Administered 2013-01-19: 1000 mL via INTRAVENOUS

## 2013-01-19 MED ORDER — HYDROCODONE-ACETAMINOPHEN 5-325 MG PO TABS
1.0000 | ORAL_TABLET | ORAL | Status: DC | PRN
  Administered 2013-01-20 – 2013-01-21 (×4): 2 via ORAL
  Filled 2013-01-19 (×4): qty 2

## 2013-01-19 MED ORDER — ONDANSETRON HCL 4 MG/2ML IJ SOLN
4.0000 mg | Freq: Once | INTRAMUSCULAR | Status: AC
  Administered 2013-01-19: 4 mg via INTRAVENOUS
  Filled 2013-01-19: qty 2

## 2013-01-19 MED ORDER — ONDANSETRON HCL 4 MG/2ML IJ SOLN
4.0000 mg | Freq: Four times a day (QID) | INTRAMUSCULAR | Status: DC | PRN
  Administered 2013-01-19 – 2013-01-20 (×2): 4 mg via INTRAVENOUS
  Filled 2013-01-19 (×2): qty 2

## 2013-01-19 MED ORDER — SODIUM CHLORIDE 0.9 % IV SOLN
INTRAVENOUS | Status: DC
  Administered 2013-01-19: 17:00:00 via INTRAVENOUS
  Administered 2013-01-20: 125 mL/h via INTRAVENOUS

## 2013-01-19 MED ORDER — ENOXAPARIN SODIUM 40 MG/0.4ML ~~LOC~~ SOLN
40.0000 mg | SUBCUTANEOUS | Status: DC
Start: 2013-01-19 — End: 2013-01-21
  Administered 2013-01-19 – 2013-01-20 (×2): 40 mg via SUBCUTANEOUS
  Filled 2013-01-19 (×3): qty 0.4

## 2013-01-19 MED ORDER — PANTOPRAZOLE SODIUM 40 MG IV SOLR
40.0000 mg | Freq: Every day | INTRAVENOUS | Status: DC
  Administered 2013-01-19: 40 mg via INTRAVENOUS
  Filled 2013-01-19 (×3): qty 40

## 2013-01-19 MED ORDER — HYDROMORPHONE HCL PF 1 MG/ML IJ SOLN
1.0000 mg | Freq: Once | INTRAMUSCULAR | Status: AC
  Administered 2013-01-19: 1 mg via INTRAVENOUS
  Filled 2013-01-19: qty 1

## 2013-01-19 MED ORDER — SODIUM CHLORIDE 0.9 % IV SOLN
3.0000 g | Freq: Four times a day (QID) | INTRAVENOUS | Status: DC
  Administered 2013-01-19 – 2013-01-20 (×4): 3 g via INTRAVENOUS
  Filled 2013-01-19 (×7): qty 3

## 2013-01-19 MED ORDER — HYDROMORPHONE HCL PF 1 MG/ML IJ SOLN
1.0000 mg | INTRAMUSCULAR | Status: DC | PRN
  Administered 2013-01-19 – 2013-01-20 (×3): 1 mg via INTRAVENOUS
  Filled 2013-01-19 (×3): qty 1

## 2013-01-19 NOTE — ED Notes (Signed)
Pt c/o abd, n/v. Went to PCP and was told she need gallbladder taken out and that was about 3 days ago. Medications that were given she couldn't keep it down.

## 2013-01-19 NOTE — H&P (Signed)
Chief Complaint: abdominal pain  HPI: Kristi Martin is a 42 year old female who presents to [facility] with ongoing RUQ abdominal pain.  Duration of symptoms is is 3 weeks.  Onset was right before thanksgiving.  She has had intermittent symptoms since.  She was seen in the ED on 01/12/13. US showed cholecystitis.  She was sent home to follow up with Dr. Lovell Sheehan.  She followed up with Dr. Lovell Sheehan the following day, but apparently did not have the funds to proceed with the surgery.  She was started on cipro, percocet, phenergan and ibuprofen.  Her last episode started last night after she ate chicken noodle soup. Onset was sudden.  Coarse is worsening. Time pattern is constant.  Associated with; nausea, vomiting, chills, sweats and subjective fevers.  Modifying factors include; meds prescribed by Dr. Lovell Sheehan without any relief. Her symptoms are severe in severity.  She denies use of anticoagulations therapy.  She is relatively healthy with a past history of cervical cancer and tubal ligation. Denies tobacco or alcohol use.  Last oral intake this AM at  5AM followed by emesis.    Past Medical History  Diagnosis Date  . Left knee pain   . Migraine   . Cancer     cervical cx at age 55  . Cervical cancer     Past Surgical History  Procedure Laterality Date  . Tubal ligation      Family History  Problem Relation Age of Onset  . Cancer Mother   . Colon cancer Mother   . Breast cancer Maternal Grandmother   . Breast cancer Paternal Grandmother    Social History:  reports that she has never smoked. She has never used smokeless tobacco. She reports that she does not drink alcohol or use illicit drugs.  Allergies:  Allergies  Allergen Reactions  . Benadryl [Diphenhydramine Hcl] Anaphylaxis and Swelling    Back of throat closes  . Onion Anaphylaxis     (Not in a hospital admission)  Results for orders placed during the hospital encounter of 01/19/13 (from the past 48 hour(s))  CBC WITH  DIFFERENTIAL     Status: Abnormal   Collection Time    01/19/13  2:36 PM      Result Value Range   WBC 4.8  4.0 - 10.5 K/uL   RBC 4.27  3.87 - 5.11 MIL/uL   Hemoglobin 11.6 (*) 12.0 - 15.0 g/dL   HCT 91.4 (*) 78.2 - 95.6 %   MCV 84.1  78.0 - 100.0 fL   MCH 27.2  26.0 - 34.0 pg   MCHC 32.3  30.0 - 36.0 g/dL   RDW 21.3  08.6 - 57.8 %   Platelets 313  150 - 400 K/uL   Neutrophils Relative % 48  43 - 77 %   Neutro Abs 2.3  1.7 - 7.7 K/uL   Lymphocytes Relative 37  12 - 46 %   Lymphs Abs 1.8  0.7 - 4.0 K/uL   Monocytes Relative 11  3 - 12 %   Monocytes Absolute 0.5  0.1 - 1.0 K/uL   Eosinophils Relative 3  0 - 5 %   Eosinophils Absolute 0.1  0.0 - 0.7 K/uL   Basophils Relative 1  0 - 1 %   Basophils Absolute 0.0  0.0 - 0.1 K/uL  COMPREHENSIVE METABOLIC PANEL     Status: Abnormal   Collection Time    01/19/13  2:36 PM      Result Value Range  Sodium 139  135 - 145 mEq/L   Potassium 3.8  3.5 - 5.1 mEq/L   Chloride 105  96 - 112 mEq/L   CO2 24  19 - 32 mEq/L   Glucose, Bld 91  70 - 99 mg/dL   BUN 10  6 - 23 mg/dL   Creatinine, Ser 6.04 (*) 0.50 - 1.10 mg/dL   Calcium 9.5  8.4 - 54.0 mg/dL   Total Protein 7.1  6.0 - 8.3 g/dL   Albumin 3.2 (*) 3.5 - 5.2 g/dL   AST 15  0 - 37 U/L   ALT 11  0 - 35 U/L   Alkaline Phosphatase 95  39 - 117 U/L   Total Bilirubin 0.2 (*) 0.3 - 1.2 mg/dL   GFR calc non Af Amer 54 (*) >90 mL/min   GFR calc Af Amer 63 (*) >90 mL/min   Comment: (NOTE)     The eGFR has been calculated using the CKD EPI equation.     This calculation has not been validated in all clinical situations.     eGFR's persistently <90 mL/min signify possible Chronic Kidney     Disease.  LIPASE, BLOOD     Status: None   Collection Time    01/19/13  2:36 PM      Result Value Range   Lipase 47  11 - 59 U/L  CG4 I-STAT (LACTIC ACID)     Status: Abnormal   Collection Time    01/19/13  3:22 PM      Result Value Range   Lactic Acid, Venous 3.16 (*) 0.5 - 2.2 mmol/L   No  results found.  Review of Systems  All other systems reviewed and are negative.    Blood pressure 149/110, pulse 87, temperature 97.6 F (36.4 C), temperature source Oral, resp. rate 18, last menstrual period 01/03/2013, SpO2 99.00%. Physical Exam  Constitutional: She is oriented to person, place, and time. She appears well-nourished. No distress.  HENT:  Head: Normocephalic and atraumatic.  Neck: Normal range of motion. Neck supple.  Cardiovascular: Normal rate, regular rhythm, normal heart sounds and intact distal pulses.  Exam reveals no friction rub.   No murmur heard. Respiratory: Effort normal and breath sounds normal. No respiratory distress.  GI: Soft. Bowel sounds are normal. She exhibits no mass. There is no guarding.  +murphy's sign, no rebound tenderness, no guarding or  Evidence of peritonitis.    Musculoskeletal: Normal range of motion. She exhibits no edema and no tenderness.  Lymphadenopathy:    She has no cervical adenopathy.  Neurological: She is alert and oriented to person, place, and time.  Skin: Skin is warm and dry. No rash noted. She is not diaphoretic. No erythema. No pallor.  Psychiatric: She has a normal mood and affect. Her behavior is normal. Judgment and thought content normal.     Assessment/Plan Symptomatic cholelithiasis, possible acute cholecystitis  Dehydration Mild renal insufficiency   Admit for IV pain control and antiemetics  Plan for a laparoscopic cholecystectomy  Start IV atbx IV hydration NPO after midnight, may have clears if she can tolerate  Repeat labs in AM   [place], [facility] ANP-BC 01/19/2013, 4:01 PM  Mother, father (he is deceased), and daughter all have had their gall bladders removed.  I think that her father had open gall bladder surgery.  She has not had symptoms until recently.  She went to the [facility] ER on 01/12/2013 and was diagnosed with gall bladder disease.  She  just saw Dr. Lovell Sheehan yesterday and discussed gall bladder surgery.  But her symptoms worsened and she came to the [facility] today. 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  I think this has created some financial difficulty. She lives with her boyfriend.  But her brother, Marisa Hua, isi in the ER with her. I reviewed the findings with her and will plan gall bladder surgery tomorrow.   I discussed with the patient the indications and risks of gall bladder surgery.  The primary risks of gall bladder surgery include, but are not limited to, bleeding, infection, common bile duct injury, and open surgery.  There is also the risk that the patient may have continued symptoms after surgery.  We discussed the typical post-operative recovery course. I tried to answer the patient's questions.   Ovidio Kin, MD, [facility] Pager: [phone] Office phone:  [phone]

## 2013-01-19 NOTE — ED Notes (Signed)
Ultrasound walked pt to the restroom without reminder of need for urine. Pt states that she did not remember. Will encourage pt to walk to the restroom and attempt to collect a urine sample again.

## 2013-01-19 NOTE — Progress Notes (Signed)
[facility] provided pt with a [facility] [employer] Card application and [employer] information.  °

## 2013-01-19 NOTE — ED Notes (Signed)
Patient states will try to void once pain is managed.

## 2013-01-19 NOTE — ED Provider Notes (Signed)
CSN: [id number]     Arrival date & time 01/19/13  1414 History   First MD Initiated Contact with Patient 01/19/13 1502     Chief Complaint  Patient presents with  . Nausea  . Emesis  . Abdominal Pain   (Consider location/radiation/quality/duration/timing/severity/associated sxs/prior Treatment) HPI Comments: 42 year old female presents with continued right upper quadrant epigastric pain for the past 3 weeks. 7 days ago she seen in the ED and had an equivocal ultrasound was concerning for cholecystitis. At that time ED physician talked with Dr. Daphine Deutscher who saw the patient next day and recommended the patient get her gallbladder out as soon as possible. The patient have money to pay for the surgery and also strandy continue antibiotics with Cipro and symptom control. However the pain has been worsening she states she's been vomiting and unable to keep her medicines down despite having Phenergan. She has been taking percocet but her pain has been worsening. She felt subjectively warm last night but did not take a temperature. She also notes some loose stools recently and had some pain with urination this AM.   Past Medical History  Diagnosis Date  . Left knee pain   . Migraine   . Cancer     cervical cx at age 48   Past Surgical History  Procedure Laterality Date  . Tubal ligation     Family History  Problem Relation Age of Onset  . Cancer Mother    History  Substance Use Topics  . Smoking status: Never Smoker   . Smokeless tobacco: Never Used  . Alcohol Use: No   OB History   Grav Para Term Preterm Abortions TAB SAB Ect Mult Living   10 7             Review of Systems  Gastrointestinal: Positive for nausea, vomiting, abdominal pain and diarrhea.  Genitourinary: Positive for dysuria.  Musculoskeletal: Negative for back pain.  All other systems reviewed and are negative.    Allergies  Benadryl and Onion  Home Medications   Current Outpatient Rx  Name  Route  Sig   Dispense  Refill  . acetaminophen (TYLENOL) 650 MG CR tablet   Oral   Take 650 mg by mouth every 8 (eight) hours as needed for pain.          Marland Kitchen aspirin 81 MG chewable tablet   Oral   Chew 1 tablet (81 mg total) by mouth daily.   30 tablet   3   . calcium elemental as carbonate (TUMS ULTRA 1000) 400 MG tablet   Oral   Chew 1,000 mg by mouth 3 (three) times daily.         . ciprofloxacin (CIPRO) 500 MG tablet   Oral   Take 1 tablet (500 mg total) by mouth 2 (two) times daily.   20 tablet   0   . ibuprofen (ADVIL,MOTRIN) 600 MG tablet   Oral   Take 600 mg by mouth every 8 (eight) hours as needed for moderate pain.         Marland Kitchen oxyCODONE-acetaminophen (PERCOCET/ROXICET) 5-325 MG per tablet   Oral   Take 1 tablet by mouth every 4 (four) hours as needed for severe pain.   20 tablet   0   . promethazine (PHENERGAN) 25 MG tablet   Oral   Take 1 tablet (25 mg total) by mouth every 6 (six) hours as needed for nausea or vomiting.   20 tablet   0  BP 149/110  Pulse 87  Temp(Src) 97.6 F (36.4 C) (Oral)  Resp 18  SpO2 99%  LMP 01/03/2013 Physical Exam  Vitals reviewed. Constitutional: She is oriented to person, place, and time. She appears well-developed and well-nourished.  Appears in pain, clutching epigastrum  HENT:  Head: Normocephalic and atraumatic.  Right Ear: External ear normal.  Left Ear: External ear normal.  Nose: Nose normal.  Eyes: Right eye exhibits no discharge. Left eye exhibits no discharge.  Cardiovascular: Normal rate, regular rhythm and normal heart sounds.   Pulmonary/Chest: Effort normal and breath sounds normal.  Abdominal: Soft. There is tenderness in the right upper quadrant, epigastric area and left upper quadrant.  Neurological: She is alert and oriented to person, place, and time.  Skin: Skin is warm and dry.    ED Course  Procedures (including critical care time) Labs Review Labs Reviewed  CBC WITH DIFFERENTIAL - Abnormal;  Notable for the following:    Hemoglobin 11.6 (*)    HCT 35.9 (*)    All other components within normal limits  COMPREHENSIVE METABOLIC PANEL - Abnormal; Notable for the following:    Creatinine, Ser 1.20 (*)    Albumin 3.2 (*)    Total Bilirubin 0.2 (*)    GFR calc non Af Amer 54 (*)    GFR calc Af Amer 63 (*)    All other components within normal limits  CG4 I-STAT (LACTIC ACID) - Abnormal; Notable for the following:    Lactic Acid, Venous 3.16 (*)    All other components within normal limits  LIPASE, BLOOD  URINALYSIS, ROUTINE W REFLEX MICROSCOPIC  COMPREHENSIVE METABOLIC PANEL  CBC   Imaging Review No results found.  EKG Interpretation   None       MDM   1. Symptomatic cholelithiasis    Symptomatic cholelithiasis vs cholecystitis. Given her vomiting despite phenergan and degree of pain, will admit to surgery.    Audree Camel, MD 01/20/13 [phone]

## 2013-01-19 NOTE — Progress Notes (Signed)
Utilization Review completed.  Ziara Thelander RN CM  

## 2013-01-19 NOTE — ED Notes (Signed)
Ultrasound at bedside

## 2013-01-20 ENCOUNTER — Observation Stay (HOSPITAL_COMMUNITY): Payer: Self-pay | Admitting: Anesthesiology

## 2013-01-20 ENCOUNTER — Encounter (HOSPITAL_COMMUNITY): Payer: MEDICAID | Admitting: Anesthesiology

## 2013-01-20 ENCOUNTER — Observation Stay (HOSPITAL_COMMUNITY): Payer: Self-pay

## 2013-01-20 ENCOUNTER — Encounter (HOSPITAL_COMMUNITY)
Admission: EM | Disposition: A | Discharge status: Home / Self Care | Payer: Self-pay | Source: Home / Self Care | Attending: Emergency Medicine

## 2013-01-20 HISTORY — PX: CHOLECYSTECTOMY: SHX55

## 2013-01-20 LAB — COMPREHENSIVE METABOLIC PANEL
AST: 81 U/L — ABNORMAL HIGH (ref 0–37)
Albumin: 2.8 g/dL — ABNORMAL LOW (ref 3.5–5.2)
BUN: 8 mg/dL (ref 6–23)
Calcium: 8.3 mg/dL — ABNORMAL LOW (ref 8.4–10.5)
Chloride: 105 mEq/L (ref 96–112)
Creatinine, Ser: 1.3 mg/dL — ABNORMAL HIGH (ref 0.50–1.10)
GFR calc Af Amer: 57 mL/min — ABNORMAL LOW (ref >90)
Glucose, Bld: 95 mg/dL (ref 70–99)
Total Bilirubin: 0.3 mg/dL (ref 0.3–1.2)
Total Protein: 5.9 g/dL — ABNORMAL LOW (ref 6.0–8.3)

## 2013-01-20 LAB — CBC
Hemoglobin: 9.9 g/dL — ABNORMAL LOW (ref 12.0–15.0)
MCHC: 31.5 g/dL (ref 30.0–36.0)
MCV: 84.6 fL (ref 78.0–100.0)
Platelets: 241 10*3/uL (ref 150–400)
RBC: 3.71 MIL/uL — ABNORMAL LOW (ref 3.87–5.11)
WBC: 3.4 10*3/uL — ABNORMAL LOW (ref 4.0–10.5)

## 2013-01-20 LAB — URINALYSIS, ROUTINE W REFLEX MICROSCOPIC
Ketones, ur: NEGATIVE mg/dL
Leukocytes, UA: NEGATIVE
Nitrite: NEGATIVE
Protein, ur: NEGATIVE mg/dL
Urobilinogen, UA: 0.2 mg/dL (ref 0.0–1.0)

## 2013-01-20 LAB — PREGNANCY, URINE: Preg Test, Ur: NEGATIVE

## 2013-01-20 SURGERY — LAPAROSCOPIC CHOLECYSTECTOMY WITH INTRAOPERATIVE CHOLANGIOGRAM
Anesthesia: General | Site: Abdomen

## 2013-01-20 MED ORDER — LIDOCAINE HCL (CARDIAC) 20 MG/ML IV SOLN
INTRAVENOUS | Status: AC
  Filled 2013-01-20: qty 5

## 2013-01-20 MED ORDER — LACTATED RINGERS IV SOLN
INTRAVENOUS | Status: DC | PRN
  Administered 2013-01-20: 1000 mL

## 2013-01-20 MED ORDER — LIDOCAINE HCL (CARDIAC) 20 MG/ML IV SOLN
INTRAVENOUS | Status: DC | PRN
  Administered 2013-01-20 (×2): 100 mg via INTRAVENOUS

## 2013-01-20 MED ORDER — BUPIVACAINE HCL (PF) 0.25 % IJ SOLN
INTRAMUSCULAR | Status: AC
  Filled 2013-01-20: qty 30

## 2013-01-20 MED ORDER — HYDROMORPHONE HCL PF 1 MG/ML IJ SOLN
INTRAMUSCULAR | Status: AC
  Filled 2013-01-20: qty 1

## 2013-01-20 MED ORDER — FENTANYL CITRATE 0.05 MG/ML IJ SOLN
INTRAMUSCULAR | Status: DC | PRN
  Administered 2013-01-20: 50 ug via INTRAVENOUS
  Administered 2013-01-20: 100 ug via INTRAVENOUS

## 2013-01-20 MED ORDER — MIDAZOLAM HCL 5 MG/5ML IJ SOLN
INTRAMUSCULAR | Status: DC | PRN
  Administered 2013-01-20: 2 mg via INTRAVENOUS

## 2013-01-20 MED ORDER — ONDANSETRON HCL 4 MG/2ML IJ SOLN
INTRAMUSCULAR | Status: AC
  Filled 2013-01-20: qty 2

## 2013-01-20 MED ORDER — PROMETHAZINE HCL 25 MG/ML IJ SOLN: 6.2500 mg | INTRAMUSCULAR | Status: DC | PRN

## 2013-01-20 MED ORDER — DEXAMETHASONE SODIUM PHOSPHATE 10 MG/ML IJ SOLN
INTRAMUSCULAR | Status: DC | PRN
  Administered 2013-01-20: 10 mg via INTRAVENOUS

## 2013-01-20 MED ORDER — HYDROMORPHONE HCL PF 1 MG/ML IJ SOLN
0.2500 mg | INTRAMUSCULAR | Status: DC | PRN
  Administered 2013-01-20: 0.5 mg via INTRAVENOUS
  Administered 2013-01-20: 0.25 mg via INTRAVENOUS
  Administered 2013-01-20 (×2): 0.5 mg via INTRAVENOUS

## 2013-01-20 MED ORDER — LACTATED RINGERS IV SOLN
INTRAVENOUS | Status: DC | PRN
  Administered 2013-01-20 (×2): via INTRAVENOUS

## 2013-01-20 MED ORDER — OXYCODONE HCL 5 MG/5ML PO SOLN: 5.0000 mg | Freq: Once | ORAL | Status: AC | PRN

## 2013-01-20 MED ORDER — DEXAMETHASONE SODIUM PHOSPHATE 10 MG/ML IJ SOLN
INTRAMUSCULAR | Status: AC
  Filled 2013-01-20: qty 1

## 2013-01-20 MED ORDER — PROPOFOL 10 MG/ML IV BOLUS
INTRAVENOUS | Status: DC | PRN
  Administered 2013-01-20: 170 mg via INTRAVENOUS

## 2013-01-20 MED ORDER — GLYCOPYRROLATE 0.2 MG/ML IJ SOLN
INTRAMUSCULAR | Status: DC | PRN
  Administered 2013-01-20: 0.6 mg via INTRAVENOUS

## 2013-01-20 MED ORDER — BUPIVACAINE HCL (PF) 0.25 % IJ SOLN
INTRAMUSCULAR | Status: DC | PRN
  Administered 2013-01-20: 30 mL

## 2013-01-20 MED ORDER — PROPOFOL 10 MG/ML IV BOLUS
INTRAVENOUS | Status: AC
  Filled 2013-01-20: qty 20

## 2013-01-20 MED ORDER — FENTANYL CITRATE 0.05 MG/ML IJ SOLN
INTRAMUSCULAR | Status: AC
  Filled 2013-01-20: qty 5

## 2013-01-20 MED ORDER — OXYCODONE HCL 5 MG PO TABS: 5.0000 mg | ORAL_TABLET | Freq: Once | ORAL | Status: AC | PRN

## 2013-01-20 MED ORDER — MEPERIDINE HCL 25 MG/ML IJ SOLN: 6.2500 mg | INTRAMUSCULAR | Status: DC | PRN

## 2013-01-20 MED ORDER — MIDAZOLAM HCL 2 MG/2ML IJ SOLN
INTRAMUSCULAR | Status: AC
  Filled 2013-01-20: qty 2

## 2013-01-20 MED ORDER — SUCCINYLCHOLINE CHLORIDE 20 MG/ML IJ SOLN
INTRAMUSCULAR | Status: DC | PRN
  Administered 2013-01-20: 100 mg via INTRAVENOUS

## 2013-01-20 MED ORDER — ONDANSETRON HCL 4 MG/2ML IJ SOLN
INTRAMUSCULAR | Status: DC | PRN
  Administered 2013-01-20: 4 mg via INTRAVENOUS

## 2013-01-20 MED ORDER — SODIUM CHLORIDE 0.9 % IV SOLN
INTRAVENOUS | Status: DC
  Administered 2013-01-20: 100 mL/h via INTRAVENOUS
  Administered 2013-01-20: 13:00:00 via INTRAVENOUS

## 2013-01-20 MED ORDER — GLYCOPYRROLATE 0.2 MG/ML IJ SOLN
INTRAMUSCULAR | Status: AC
  Filled 2013-01-20: qty 3

## 2013-01-20 MED ORDER — SODIUM CHLORIDE 0.9 % IV SOLN
3.0000 g | Freq: Four times a day (QID) | INTRAVENOUS | Status: AC
  Administered 2013-01-20 (×2): 3 g via INTRAVENOUS
  Filled 2013-01-20 (×2): qty 3

## 2013-01-20 MED ORDER — ROCURONIUM BROMIDE 100 MG/10ML IV SOLN
INTRAVENOUS | Status: DC | PRN
  Administered 2013-01-20: 30 mg via INTRAVENOUS

## 2013-01-20 MED ORDER — DIATRIZOATE MEGLUMINE 30 % UR SOLN
URETHRAL | Status: DC | PRN
  Administered 2013-01-20: 8 mL

## 2013-01-20 MED ORDER — ROCURONIUM BROMIDE 100 MG/10ML IV SOLN
INTRAVENOUS | Status: AC
  Filled 2013-01-20: qty 1

## 2013-01-20 MED ORDER — SUCCINYLCHOLINE CHLORIDE 20 MG/ML IJ SOLN
INTRAMUSCULAR | Status: AC
  Filled 2013-01-20: qty 1

## 2013-01-20 MED ORDER — NEOSTIGMINE METHYLSULFATE 1 MG/ML IJ SOLN
INTRAMUSCULAR | Status: DC | PRN
  Administered 2013-01-20: 5 mg via INTRAVENOUS

## 2013-01-20 SURGICAL SUPPLY — 42 items
ADH SKN CLS APL DERMABOND .7 (GAUZE/BANDAGES/DRESSINGS) ×1
APL SKNCLS STERI-STRIP NONHPOA (GAUZE/BANDAGES/DRESSINGS) ×1
APPLIER CLIP ROT 10 11.4 M/L (STAPLE) ×2
APR CLP MED LRG 11.4X10 (STAPLE) ×1
BAG SPEC RTRVL LRG 6X4 10 (ENDOMECHANICALS)
BENZOIN TINCTURE PRP APPL 2/3 (GAUZE/BANDAGES/DRESSINGS) ×2 IMPLANT
CANISTER SUCTION 2500CC (MISCELLANEOUS) ×2 IMPLANT
CHLORAPREP W/TINT 26ML (MISCELLANEOUS) ×2 IMPLANT
CHOLANGIOGRAM CATH TAUT (CATHETERS) ×2 IMPLANT
CLIP APPLIE ROT 10 11.4 M/L (STAPLE) ×1 IMPLANT
COVER MAYO STAND STRL (DRAPES) IMPLANT
DECANTER SPIKE VIAL GLASS SM (MISCELLANEOUS) ×1 IMPLANT
DERMABOND ADVANCED (GAUZE/BANDAGES/DRESSINGS) ×1
DERMABOND ADVANCED .7 DNX12 (GAUZE/BANDAGES/DRESSINGS) IMPLANT
DRAPE C-ARM 42X120 X-RAY (DRAPES) IMPLANT
DRAPE LAPAROSCOPIC ABDOMINAL (DRAPES) ×2 IMPLANT
DRAPE UTILITY XL STRL (DRAPES) ×2 IMPLANT
ELECT REM PT RETURN 9FT ADLT (ELECTROSURGICAL) ×2
ELECTRODE REM PT RTRN 9FT ADLT (ELECTROSURGICAL) ×1 IMPLANT
GLOVE SURG SIGNA 7.5 PF LTX (GLOVE) ×3 IMPLANT
GLOVE SURG SS PI 6.5 STRL IVOR (GLOVE) ×1 IMPLANT
GOWN STRL REIN XL XLG (GOWN DISPOSABLE) ×7 IMPLANT
HEMOSTAT SURGICEL 4X8 (HEMOSTASIS) IMPLANT
IV CATH 14GX2 1/4 (CATHETERS) ×2 IMPLANT
IV SET MACRO CATH EXT 6 LUER (IV SETS) ×2 IMPLANT
KIT BASIN OR (CUSTOM PROCEDURE TRAY) ×2 IMPLANT
NS IRRIG 1000ML POUR BTL (IV SOLUTION) IMPLANT
POUCH SPECIMEN RETRIEVAL 10MM (ENDOMECHANICALS) IMPLANT
SCISSORS LAP 5X35 DISP (ENDOMECHANICALS) ×1 IMPLANT
SET IRRIG TUBING LAPAROSCOPIC (IRRIGATION / IRRIGATOR) ×2 IMPLANT
SLEEVE XCEL OPT CAN 5 100 (ENDOMECHANICALS) ×2 IMPLANT
SOLUTION ANTI FOG 6CC (MISCELLANEOUS) ×2 IMPLANT
STOPCOCK 4 WAY LG BORE MALE ST (IV SETS) ×2 IMPLANT
STRIP CLOSURE SKIN 1/4X4 (GAUZE/BANDAGES/DRESSINGS) ×2 IMPLANT
SUT VIC AB 5-0 PS2 18 (SUTURE) ×2 IMPLANT
TOWEL OR 17X26 10 PK STRL BLUE (TOWEL DISPOSABLE) ×2 IMPLANT
TOWEL OR NON WOVEN STRL DISP B (DISPOSABLE) ×2 IMPLANT
TRAY LAP CHOLE (CUSTOM PROCEDURE TRAY) ×2 IMPLANT
TROCAR BLADELESS OPT 5 100 (ENDOMECHANICALS) ×2 IMPLANT
TROCAR XCEL BLUNT TIP 100MML (ENDOMECHANICALS) ×2 IMPLANT
TROCAR XCEL NON-BLD 11X100MML (ENDOMECHANICALS) ×2 IMPLANT
TUBING INSUFFLATION 10FT LAP (TUBING) ×2 IMPLANT

## 2013-01-20 NOTE — Progress Notes (Signed)
Patient ID: Kristi Martin, female   DOB: 1970/11/17, 42 y.o.   MRN: [id number] Day of Surgery  Subjective: Pt still with some pain this am, but feels ok.  Some nausea  Objective: Vital signs in last 24 hours: Temp:  [97.6 F (36.4 C)-97.9 F (36.6 C)] 97.8 F (36.6 C) (12/18 0648) Pulse Rate:  [67-87] 74 (12/18 0648) Resp:  [16-18] 16 (12/18 0648) BP: (87-149)/(47-110) 111/61 mmHg (12/18 0648) SpO2:  [93 %-100 %] 98 % (12/18 0648) Weight:  [264 lb 12.8 oz (120.112 kg)] 264 lb 12.8 oz (120.112 kg) (12/17 1800) Last BM Date: 01/18/13  Intake/Output from previous day: 12/17 0701 - 12/18 0700 In: 960 [P.O.:960] Out: 1100 [Urine:1100] Intake/Output this shift: Total I/O In: -  Out: 200 [Urine:200]  PE: Abd: soft, tender in RUQ, otherwise benign.  +BS, ND Heart: regular Lungs: CTAB  Lab Results:   Recent Labs  01/19/13 1436 01/20/13 0430  WBC 4.8 3.4*  HGB 11.6* 9.9*  HCT 35.9* 31.4*  PLT 313 241   BMET  Recent Labs  01/19/13 1436 01/20/13 0430  NA 139 138  K 3.8 3.8  CL 105 105  CO2 24 26  GLUCOSE 91 95  BUN 10 8  CREATININE 1.20* 1.30*  CALCIUM 9.5 8.3*   PT/INR No results found for this basename: LABPROT, INR,  in the last 72 hours CMP     Component Value Date/Time   NA 138 01/20/2013 0430   K 3.8 01/20/2013 0430   CL 105 01/20/2013 0430   CO2 26 01/20/2013 0430   GLUCOSE 95 01/20/2013 0430   BUN 8 01/20/2013 0430   CREATININE 1.30* 01/20/2013 0430   CALCIUM 8.3* 01/20/2013 0430   PROT 5.9* 01/20/2013 0430   ALBUMIN 2.8* 01/20/2013 0430   AST 81* 01/20/2013 0430   ALT 36* 01/20/2013 0430   ALKPHOS 83 01/20/2013 0430   BILITOT 0.3 01/20/2013 0430   GFRNONAA 49* 01/20/2013 0430   GFRAA 57* 01/20/2013 0430   Lipase     Component Value Date/Time   LIPASE 47 01/19/2013 1436       Studies/Results: US Abdomen Limited Ruq  01/19/2013   CLINICAL DATA:  Right upper quadrant pain. Nausea and vomiting. Gallstones.  EXAM: US ABDOMEN LIMITED  - RIGHT UPPER QUADRANT  COMPARISON:  01/12/2013  FINDINGS: Gallbladder  Cervical stones again seen, largest measuring 2.5 cm. Gallbladder is nondilated and there is no evidence of wall thickening or pericholecystic fluid. No sonographic Murphy sign noted.  Common bile duct  Diameter: 4 mm  Liver:  No focal lesion identified. Within normal limits in parenchymal echogenicity.  IMPRESSION: Cholelithiasis again noted. No definite sonographic signs of acute cholecystitis or biliary dilatation.   Electronically Signed   By: Myles Rosenthal M.D.   On: 01/19/2013 16:39    Anti-infectives: Anti-infectives   Start     Dose/Rate Route Frequency Ordered Stop   01/19/13 1630  Ampicillin-Sulbactam (UNASYN) 3 g in sodium chloride 0.9 % 100 mL IVPB     3 g 100 mL/hr over 60 Minutes Intravenous 4 times per day 01/19/13 1615         Assessment/Plan  1. Acute cholecystitis 2. Anemia of unknown source 3. Slight elevation of creatinine at 1.3  Plan: 1. Plan for PCP evaluation of #2,3 as outpatient. 2. Plan for OR today for lap chole by Dr. Ezzard Standing   LOS: 1 day    OSBORNE,KELLY E 01/20/2013, 8:33 AM Pager: [phone]  Agree with above.  Ovidio Kin,  MD, [facility] Surgery Pager: [phone] Office phone:  [phone]

## 2013-01-20 NOTE — Op Note (Signed)
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  Threasa HeadsBarrie Dunker, M.D.  ANESTHESIA:   general  Anesthesiologist: Gaylan Gerold, MD CRNA: Doran Clay, CRNA  General  ASA: 2  EBL:  minimal  ml  BLOOD ADMINISTERED: none  DRAINS: none   LOCAL MEDICATIONS USED:   30 cc 1/4% marcaine  SPECIMEN:   Gall bladder  COUNTS CORRECT:  YES  INDICATIONS FOR PROCEDURE:  Kristi Martin is a 42 y.o. (DOB: 1970/07/29) AA  female whose primary care physician is No PCP Per Patient and comes for cholecystectomy.   The indications and risks of the gall bladder surgery were explained to the patient.  The risks include, but are not limited to, infection, bleeding, common bile duct injury and open surgery.  SURGERY:  The patient was taken to room #6 at [facility].  The abdomen was prepped with chloroprep.  The patient was already on Unasyn.   A time out was held and the surgical checklist run.   An infraumbilical incision was made into the abdominal cavity.  A 12 mm Hasson trocar was inserted into the abdominal cavity through the infraumbilical incision and secured with a 0 Vicryl suture.  Three additional trocars were inserted: a 10 mm trocar in the sub-xiphoid location, a 5 mm trocar in the right mid subcostal area, and a 5 mm trocar in the right lateral subcostal area.   The abdomen was explored and the liver, stomach, and bowel that could be seen were unremarkable.  There were Fitz-Curtis adhesions over the right lobe of the liver.   The gall bladder was identified, grasped, and rotated cephalad.  Disssection was carried down to the gall bladder/cystic duct junction and the  cystic duct isolated.  A clip was placed on the gall bladder side of the cystic duct.   An intra-operative cholangiogram was shot.   The intra-operative cholangiogram was shot using a cut off Taut catheter placed through a 14 gauge angiocath in the RUQ.  The Taut catheter was inserted in the cut cystic duct and secured with an endoclip.  A cholangiogram was shot with 8 cc of 1/2 strength Omnipaque.  Using fluoroscopy, the cholangiogram showed the flow of contrast into the common bile duct, up the hepatic radicals, and into the duodenum.  There was no mass or obstruction.  This was a normal intra-operative cholangiogram.   The Taut catheter was removed.  The cystic duct was tripley endoclipped and the cystic artery was identified and clipped.  The gall bladder was bluntly and sharpley dissected from the gall bladder bed.   After the gall bladder was removed from the liver, the gall bladder bed and Triangle of Calot were inspected.  There was no bleeding or bile leak.  The gall bladder was placed in a endocatch bag and delivered through the umbilicus.  The abdomen was irrigated with 800 cc saline.   The trocars were then removed.  I infiltrated 30cc of 1/4% Marcaine into the incisions.  The umbilical port closed with a 0 Vicryl suture and the skin closed with 5-0 vicryl.  The skin was painted with Dermabond.  The patient's sponge and needle count were correct.  The patient was transported to the  RR in good condition.  Alphonsa Overall, MD, [facility] Surgery Pager: [phone] Office phone:  [phone]

## 2013-01-20 NOTE — Anesthesia Preprocedure Evaluation (Signed)
Anesthesia Evaluation  Patient identified by MRN, date of birth, ID band Patient awake    Reviewed: Allergy & Precautions, H&P , NPO status , Patient's Chart, lab work & pertinent test results  Airway       Dental  (+) Dental Advisory Given   Pulmonary neg pulmonary ROS,          Cardiovascular negative cardio ROS      Neuro/Psych  Headaches, negative psych ROS   GI/Hepatic negative GI ROS, Neg liver ROS,   Endo/Other  Morbid obesity  Renal/GU negative Renal ROS     Musculoskeletal negative musculoskeletal ROS (+)   Abdominal (+) + obese,   Peds  Hematology negative hematology ROS (+)   Anesthesia Other Findings   Reproductive/Obstetrics negative OB ROS                           Anesthesia Physical Anesthesia Plan  ASA: III  Anesthesia Plan: General   Post-op Pain Management:    Induction: Intravenous and Rapid sequence  Airway Management Planned: Oral ETT  Additional Equipment:   Intra-op Plan:   Post-operative Plan: Extubation in OR  Informed Consent: I have reviewed the patients History and Physical, chart, labs and discussed the procedure including the risks, benefits and alternatives for the proposed anesthesia with the patient or authorized representative who has indicated his/her understanding and acceptance.   Dental advisory given  Plan Discussed with: CRNA  Anesthesia Plan Comments:         Anesthesia Quick Evaluation

## 2013-01-20 NOTE — Transfer of Care (Signed)
Immediate Anesthesia Transfer of Care Note  Patient: Kristi Martin  Procedure(s) Performed: Procedure(s): LAPAROSCOPIC CHOLECYSTECTOMY WITH INTRAOPERATIVE CHOLANGIOGRAM (N/A)  Patient Location: PACU  Anesthesia Type:General  Level of Consciousness: sedated  Airway & Oxygen Therapy: Patient Spontanous Breathing and Patient connected to face mask oxygen  Post-op Assessment: Report given to PACU RN and Post -op Vital signs reviewed and stable  Post vital signs: Reviewed and stable  Complications: No apparent anesthesia complications

## 2013-01-21 ENCOUNTER — Encounter (HOSPITAL_COMMUNITY): Payer: Self-pay | Admitting: Surgery

## 2013-01-21 MED ORDER — HYDROCODONE-ACETAMINOPHEN 5-325 MG PO TABS: 1.0000 | ORAL_TABLET | ORAL | Status: DC | PRN

## 2013-01-21 NOTE — Progress Notes (Signed)
Pt. Was discharged home she was given her discharge instructions, prescriptions, and all questions were answered.  She was transported home by her family.

## 2013-01-21 NOTE — Anesthesia Postprocedure Evaluation (Signed)
Anesthesia Post Note  Patient: Kristi Martin  Procedure(s) Performed: Procedure(s) (LRB): LAPAROSCOPIC CHOLECYSTECTOMY WITH INTRAOPERATIVE CHOLANGIOGRAM (N/A)  Anesthesia type: General  Patient location: PACU  Post pain: Pain level controlled  Post assessment: Post-op Vital signs reviewed  Last Vitals: BP 97/61  Pulse 72  Temp(Src) 36.7 C (Oral)  Resp 20  Ht 5\' 6"  (1.676 m)  Wt 264 lb 12.8 oz (120.112 kg)  BMI 42.76 kg/m2  SpO2 95%  LMP 01/03/2013  Post vital signs: Reviewed  Level of consciousness: sedated  Complications: No apparent anesthesia complications

## 2013-01-21 NOTE — Discharge Summary (Signed)
Patient ID: [name] MRN: [id number] DOB/AGE: 07-04-1970 42 y.o.  Admit date: 01/19/2013 Discharge date: 01/21/2013  Procedures: lap chole with IOC  Consults: None  Reason for Admission: Kristi Martin is a 42 year old female who presents to [facility] with ongoing RUQ abdominal pain. Duration of symptoms is is 3 weeks. Onset was right before thanksgiving. She has had intermittent symptoms since. She was seen in the ED on 01/12/13. US showed cholecystitis. She was sent home to follow up with Dr. Lovell Sheehan. She followed up with Dr. Lovell Sheehan the following day, but apparently did not have the funds to proceed with the surgery. She was started on cipro, percocet, phenergan and ibuprofen. Her last episode started last night after she ate chicken noodle soup. Onset was sudden. Coarse is worsening. Time pattern is constant. Associated with; nausea, vomiting, chills, sweats and subjective fevers. Modifying factors include; meds prescribed by Dr. Lovell Sheehan without any relief. Her symptoms are severe in severity. She denies use of anticoagulations therapy. She is relatively healthy with a past history of cervical cancer and tubal ligation. Denies tobacco or alcohol use. Last oral intake this AM at 5AM followed by emesis.   Admission Diagnoses:  1. Acute cholecystitis 2. Anemia  Hospital Course: the patient was admitted and placed on IV abx therapy.  The following day she was taken to the OR where she underwent a lap chole with IOC.  She tolerated the procedure well and was tolerating a regular diet and her pain was well controlled on POD1.  She was stable for dc home.  She is encouraged to obtain a PCP and information was given to assist with this to follow up for her anemia and her chronic slight creatinine elevation around 1.2-1.3.  She is agreeable and following up with someone on Monday for this.  PE: Abd: soft, appropriately tender, +BS, ND, incisions c/d/i  Discharge Diagnoses:  Active Problems:  Symptomatic cholelithiasis s/p lap chole Anemia Slight elevation of Creatinine  Discharge Medications:   Medication List    STOP taking these medications       ciprofloxacin 500 MG tablet  Commonly known as:  CIPRO     oxyCODONE-acetaminophen 5-325 MG per tablet  Commonly known as:  PERCOCET/ROXICET      TAKE these medications       aspirin 81 MG chewable tablet  Chew 1 tablet (81 mg total) by mouth daily.     HYDROcodone-acetaminophen 5-325 MG per tablet  Commonly known as:  NORCO/VICODIN  Take 1-2 tablets by mouth every 4 (four) hours as needed for moderate pain.     ibuprofen 600 MG tablet  Commonly known as:  ADVIL,MOTRIN  Take 600 mg by mouth every 8 (eight) hours as needed for moderate pain.     promethazine 25 MG tablet  Commonly known as:  PHENERGAN  Take 1 tablet (25 mg total) by mouth every 6 (six) hours as needed for nausea or vomiting.        Discharge Instructions:     Follow-up Information   Follow up with [facility] On 02/15/2013. (1:45pm, arrive by 1:15 for paperwork)    Contact information:   [address]   [place] [address] [phone]       Signed: Letha Cape 01/21/2013, 8:37 AM  Agree with above.  Ovidio Kin, MD, [facility] Surgery Pager: [phone] Office phone:  [phone]

## 2013-01-21 NOTE — Care Management Note (Signed)
CM spoke with pt at the bedside concerning discharge planning. Pt self pay, no PCP on record. Cm provided pt with information concerning [facility]. Pt states able to afford meds. Pt uses [facility]. Financial Counselor consulted with pt concerning bill. Pt mother's to provide tx home.    Roxy Manns Lyrique Hakim,MSN,RN [phone]

## 2013-02-15 ENCOUNTER — Encounter (INDEPENDENT_AMBULATORY_CARE_PROVIDER_SITE_OTHER): Payer: Self-pay | Admitting: General Surgery

## 2013-02-28 ENCOUNTER — Encounter (INDEPENDENT_AMBULATORY_CARE_PROVIDER_SITE_OTHER): Payer: Self-pay | Admitting: General Surgery

## 2013-03-11 ENCOUNTER — Emergency Department (HOSPITAL_COMMUNITY): Payer: Self-pay

## 2013-03-11 ENCOUNTER — Emergency Department (HOSPITAL_COMMUNITY)
Admission: EM | Admit: 2013-03-11 | Discharge: 2013-03-11 | Disposition: A | Discharge status: Home / Self Care | Payer: Self-pay | Attending: Emergency Medicine | Admitting: Emergency Medicine

## 2013-03-11 ENCOUNTER — Encounter (HOSPITAL_COMMUNITY): Payer: Self-pay | Admitting: Emergency Medicine

## 2013-03-11 DIAGNOSIS — R220 Localized swelling, mass and lump, head: Secondary | ICD-10-CM | POA: Insufficient documentation

## 2013-03-11 DIAGNOSIS — Z7982 Long term (current) use of aspirin: Secondary | ICD-10-CM | POA: Insufficient documentation

## 2013-03-11 DIAGNOSIS — Z8541 Personal history of malignant neoplasm of cervix uteri: Secondary | ICD-10-CM | POA: Insufficient documentation

## 2013-03-11 DIAGNOSIS — Z8669 Personal history of other diseases of the nervous system and sense organs: Secondary | ICD-10-CM | POA: Insufficient documentation

## 2013-03-11 DIAGNOSIS — Z8679 Personal history of other diseases of the circulatory system: Secondary | ICD-10-CM | POA: Insufficient documentation

## 2013-03-11 DIAGNOSIS — R221 Localized swelling, mass and lump, neck: Secondary | ICD-10-CM

## 2013-03-11 DIAGNOSIS — K0889 Other specified disorders of teeth and supporting structures: Secondary | ICD-10-CM

## 2013-03-11 DIAGNOSIS — K089 Disorder of teeth and supporting structures, unspecified: Secondary | ICD-10-CM | POA: Insufficient documentation

## 2013-03-11 LAB — CBC WITH DIFFERENTIAL/PLATELET
BASOS ABS: 0.1 10*3/uL (ref 0.0–0.1)
BASOS PCT: 1 % (ref 0–1)
EOS ABS: 0.1 10*3/uL (ref 0.0–0.7)
Eosinophils Relative: 2 % (ref 0–5)
HCT: 36.1 % (ref 36.0–46.0)
Hemoglobin: 11.7 g/dL — ABNORMAL LOW (ref 12.0–15.0)
LYMPHS ABS: 1.8 10*3/uL (ref 0.7–4.0)
Lymphocytes Relative: 34 % (ref 12–46)
MCH: 27.3 pg (ref 26.0–34.0)
MCHC: 32.4 g/dL (ref 30.0–36.0)
MCV: 84.3 fL (ref 78.0–100.0)
Monocytes Absolute: 0.7 10*3/uL (ref 0.1–1.0)
Monocytes Relative: 12 % (ref 3–12)
NEUTROS PCT: 51 % (ref 43–77)
Neutro Abs: 2.8 10*3/uL (ref 1.7–7.7)
PLATELETS: 305 10*3/uL (ref 150–400)
RBC: 4.28 MIL/uL (ref 3.87–5.11)
RDW: 14.6 % (ref 11.5–15.5)
WBC: 5.4 10*3/uL (ref 4.0–10.5)

## 2013-03-11 LAB — BASIC METABOLIC PANEL
BUN: 14 mg/dL (ref 6–23)
CO2: 29 mEq/L (ref 19–32)
Calcium: 8.9 mg/dL (ref 8.4–10.5)
Chloride: 103 mEq/L (ref 96–112)
Creatinine, Ser: 1.25 mg/dL — ABNORMAL HIGH (ref 0.50–1.10)
GFR, EST AFRICAN AMERICAN: 60 mL/min — AB (ref >90)
GFR, EST NON AFRICAN AMERICAN: 52 mL/min — AB (ref >90)
Glucose, Bld: 103 mg/dL — ABNORMAL HIGH (ref 70–99)
POTASSIUM: 3.7 meq/L (ref 3.7–5.3)
SODIUM: 141 meq/L (ref 137–147)

## 2013-03-11 MED ORDER — IOHEXOL 300 MG/ML  SOLN
80.0000 mL | Freq: Once | INTRAMUSCULAR | Status: AC | PRN
  Administered 2013-03-11: 80 mL via INTRAVENOUS

## 2013-03-11 MED ORDER — OXYCODONE-ACETAMINOPHEN 5-325 MG PO TABS
1.0000 | ORAL_TABLET | Freq: Once | ORAL | Status: AC
  Administered 2013-03-11: 1 via ORAL
  Filled 2013-03-11: qty 1

## 2013-03-11 MED ORDER — CLINDAMYCIN HCL 150 MG PO CAPS: 300.0000 mg | ORAL_CAPSULE | Freq: Three times a day (TID) | ORAL | Status: DC

## 2013-03-11 MED ORDER — OXYCODONE-ACETAMINOPHEN 5-325 MG PO TABS: 1.0000 | ORAL_TABLET | ORAL | Status: DC | PRN

## 2013-03-11 NOTE — ED Notes (Addendum)
Pt reports left jaw pain x3 days. Pt reports left eye was swollen one day one but no orbital swelling noted in triage. Pt alert and oriented. Pt denies any fevers. Pt reports extreme pain with movement of left jaw. Pt denies any known injury. Pt denies any chest pain,sob, left arm pain.

## 2013-03-11 NOTE — Discharge Instructions (Signed)
Dental Pain Toothache is pain in or around a tooth. It may get worse with chewing or with cold or heat.  HOME CARE  Your dentist may use a numbing medicine during treatment. If so, you may need to avoid eating until the medicine wears off. Ask your dentist about this.  Only take medicine as told by your dentist or doctor.  Avoid chewing food near the painful tooth until after all treatment is done. Ask your dentist about this. GET HELP RIGHT AWAY IF:   The problem gets worse or new problems appear.  You have a fever.  There is redness and puffiness (swelling) of the face, jaw, or neck.  You cannot open your mouth.  There is pain in the jaw.  There is very bad pain that is not helped by medicine. MAKE SURE YOU:   Understand these instructions.  Will watch your condition.  Will get help right away if you are not doing well or get worse. Document Released: 07/09/2007 Document Revised: 04/14/2011 Document Reviewed: 07/09/2007 [facility] Patient Information 2014 [place], [place].

## 2013-03-11 NOTE — ED Notes (Addendum)
Lt jaw pain . No mandibular teeth on left. Says she feels "clicking" when moves her jaw.

## 2013-03-11 NOTE — ED Provider Notes (Signed)
CSN: [id number]     Arrival date & time 03/11/13  30 History   First MD Initiated Contact with Patient 03/11/13 1528     Chief Complaint  Patient presents with  . Jaw Pain   (Consider location/radiation/quality/duration/timing/severity/associated sxs/prior Treatment) HPI Comments: Kristi Martin is a 43 y.o. female who presents to the Emergency Department complaining of left lower jaw pain and swelling.  States the symptoms have been waxing and waning for 2 days.  Woke this morning with significant swelling of her jaw that improved with heat.  She states the pain is worse with chewing and movement of her neck.  She denies fever, chills, difficulty swallowing or breathing.  She also denies any chest pain, arm pain, diaphoresis, or dyspnea.     The history is provided by the patient.    Past Medical History  Diagnosis Date  . Left knee pain   . Migraine   . Cancer     cervical cx at age 78  . Cervical cancer    Past Surgical History  Procedure Laterality Date  . Tubal ligation    . Cholecystectomy N/A 01/20/2013    Procedure: LAPAROSCOPIC CHOLECYSTECTOMY WITH INTRAOPERATIVE CHOLANGIOGRAM;  Surgeon: Shann Medal, MD;  Location: [facility];  Service: General;  Laterality: N/A;   Family History  Problem Relation Age of Onset  . Cancer Mother   . Colon cancer Mother   . Breast cancer Maternal Grandmother   . Breast cancer Paternal Grandmother    History  Substance Use Topics  . Smoking status: Never Smoker   . Smokeless tobacco: Never Used  . Alcohol Use: No   OB History   Grav Para Term Preterm Abortions TAB SAB Ect Mult Living   10 7             Review of Systems  Constitutional: Negative for fever, chills and appetite change.  HENT: Positive for facial swelling. Negative for congestion, ear pain, sore throat, trouble swallowing and voice change.        Left jaw pain  Eyes: Negative for pain and visual disturbance.  Respiratory: Negative for chest tightness, shortness  of breath, wheezing and stridor.   Cardiovascular: Negative for chest pain.  Gastrointestinal: Negative for nausea and vomiting.  Genitourinary: Negative for dysuria.  Musculoskeletal: Negative for neck pain and neck stiffness.  Skin: Negative for color change and rash.  Neurological: Negative for dizziness, seizures, facial asymmetry, speech difficulty, weakness and headaches.  Hematological: Negative for adenopathy.  All other systems reviewed and are negative.    Allergies  Benadryl and Onion  Home Medications   Current Outpatient Rx  Name  Route  Sig  Dispense  Refill  . aspirin 81 MG chewable tablet   Oral   Chew 1 tablet (81 mg total) by mouth daily.   30 tablet   3    BP 116/52  Pulse 89  Temp(Src) 98.3 F (36.8 C) (Oral)  Resp 18  Ht 5\' 6"  (1.676 m)  Wt 250 lb (113.399 kg)  BMI 40.37 kg/m2  SpO2 98%  LMP 03/11/2013 Physical Exam  Nursing note and vitals reviewed. Constitutional: She is oriented to person, place, and time. She appears well-developed and well-nourished. No distress.  HENT:  Head: Normocephalic and atraumatic.  Right Ear: Tympanic membrane and ear canal normal.  Left Ear: Tympanic membrane and ear canal normal.  Mouth/Throat: Uvula is midline, oropharynx is clear and moist and mucous membranes are normal. No trismus in the jaw. No  dental abscesses or uvula swelling.  ttp along the left lower mandible and lower gums.  Slight edema present.  No induration, erythema or fluctuance.   Floor of the mouth is nml appearing.  No obvious TMJ click  Eyes: Conjunctivae and EOM are normal. Pupils are equal, round, and reactive to light.  Neck: Normal range of motion. Neck supple.  Cardiovascular: Normal rate, regular rhythm, normal heart sounds and intact distal pulses.   No murmur heard. Pulmonary/Chest: Effort normal and breath sounds normal. No respiratory distress.  Musculoskeletal: Normal range of motion.  Lymphadenopathy:    She has no cervical  adenopathy.  Neurological: She is alert and oriented to person, place, and time. She exhibits normal muscle tone. Coordination normal.  Skin: Skin is warm and dry.    ED Course  Procedures (including critical care time) Labs Review Labs Reviewed  CBC WITH DIFFERENTIAL - Abnormal; Notable for the following:    Hemoglobin 11.7 (*)    All other components within normal limits  BASIC METABOLIC PANEL - Abnormal; Notable for the following:    Glucose, Bld 103 (*)    Creatinine, Ser 1.25 (*)    GFR calc non Af Amer 52 (*)    GFR calc Af Amer 60 (*)    All other components within normal limits   Imaging Review Ct Soft Tissue Neck W Contrast  03/11/2013   CLINICAL DATA:  Left jaw pain and swelling  EXAM: CT NECK WITH CONTRAST  TECHNIQUE: Multidetector CT imaging of the neck was performed using the standard protocol following the bolus administration of intravenous contrast.  CONTRAST:  41mL OMNIPAQUE IOHEXOL 300 MG/ML  SOLN  COMPARISON:  None.  FINDINGS: There is mild lucency around the retained root of the left lower molar. This could be due to infection and could be symptomatic. No soft tissue swelling is seen around the mandible. No soft tissue abscess is present.  Orbits are normal. Visualized intracranial contents are normal. Mild mucosal edema in the right maxillary sinus. The skullbase lesion.  The tongue is normal. The tonsils are normal. Epiglottis and larynx are normal.  Parotid and submandibular glands are normal bilaterally. Thyroid is normal. Normal cervical spine.  Cervical lymph nodes are present bilaterally. Left submandibular node measures 10.5 mm. Lymph node lateral to the left submandibular gland measures 8.7 mm. Right submandibular node measures 7.3 mm. Left level 2 node measures 10.3 mm. Right level 2 node measures 9.4 mm. Additional small nodes are seen throughout the neck bilaterally.  IMPRESSION: Mild lucency around a retained root of a left lower molar which could be due to  infection and could be symptomatic. No adjacent cellulitis or soft tissue abscess.  Mild cervical adenopathy, possibly reactive. Biopsy may be appropriate if these increase in size or become symptomatic. Largest node is left submandibular region.   Electronically Signed   By: Franchot Gallo M.D.   On: 03/11/2013 17:43    EKG Interpretation   None       MDM    1739  Patient is resting comfortably, pain controlled.  Waiting for CT results.    CT results discussed.  Patient agrees to close f/u with dentist.  Clindamycin and #12 percocet for pain.  Appears stable for discharge.  Marriah Sanderlin L. Vanessa [place], PA-C 03/11/13 1754

## 2013-03-12 NOTE — ED Provider Notes (Signed)
Medical screening examination/treatment/procedure(s) were performed by non-physician practitioner and as supervising physician I was immediately available for consultation/collaboration.  EKG Interpretation   None         Bobak Oguinn W. Auther Lyerly, MD 03/12/13 1706 

## 2013-05-14 ENCOUNTER — Emergency Department (HOSPITAL_COMMUNITY): Payer: Self-pay

## 2013-05-14 ENCOUNTER — Emergency Department (HOSPITAL_COMMUNITY)
Admission: EM | Admit: 2013-05-14 | Discharge: 2013-05-14 | Disposition: A | Discharge status: Home / Self Care | Payer: Self-pay | Attending: Emergency Medicine | Admitting: Emergency Medicine

## 2013-05-14 ENCOUNTER — Encounter (HOSPITAL_COMMUNITY): Payer: Self-pay | Admitting: Emergency Medicine

## 2013-05-14 DIAGNOSIS — W010XXA Fall on same level from slipping, tripping and stumbling without subsequent striking against object, initial encounter: Secondary | ICD-10-CM | POA: Insufficient documentation

## 2013-05-14 DIAGNOSIS — M25562 Pain in left knee: Secondary | ICD-10-CM

## 2013-05-14 DIAGNOSIS — Z8679 Personal history of other diseases of the circulatory system: Secondary | ICD-10-CM | POA: Insufficient documentation

## 2013-05-14 DIAGNOSIS — Y9389 Activity, other specified: Secondary | ICD-10-CM | POA: Insufficient documentation

## 2013-05-14 DIAGNOSIS — Y9229 Other specified public building as the place of occurrence of the external cause: Secondary | ICD-10-CM | POA: Insufficient documentation

## 2013-05-14 DIAGNOSIS — Z7982 Long term (current) use of aspirin: Secondary | ICD-10-CM | POA: Insufficient documentation

## 2013-05-14 DIAGNOSIS — Z8541 Personal history of malignant neoplasm of cervix uteri: Secondary | ICD-10-CM | POA: Insufficient documentation

## 2013-05-14 DIAGNOSIS — R269 Unspecified abnormalities of gait and mobility: Secondary | ICD-10-CM | POA: Insufficient documentation

## 2013-05-14 DIAGNOSIS — S93409A Sprain of unspecified ligament of unspecified ankle, initial encounter: Secondary | ICD-10-CM | POA: Insufficient documentation

## 2013-05-14 DIAGNOSIS — Z792 Long term (current) use of antibiotics: Secondary | ICD-10-CM | POA: Insufficient documentation

## 2013-05-14 MED ORDER — HYDROCODONE-ACETAMINOPHEN 5-325 MG PO TABS: 1.0000 | ORAL_TABLET | ORAL | Status: DC | PRN

## 2013-05-14 MED ORDER — IBUPROFEN 800 MG PO TABS: 800.0000 mg | ORAL_TABLET | Freq: Three times a day (TID) | ORAL | Status: DC

## 2013-05-14 NOTE — ED Provider Notes (Signed)
CSN: [id number]     Arrival date & time 05/14/13  1546 History  This chart was scribed for non-physician practitioner Harvie Heck working with Mariea Clonts, MD by Donato Schultz, ED Scribe. This patient was seen in room WTR8/WTR8 and the patient's care was started at [facility] PM.     Chief Complaint  Patient presents with  . Knee Pain  . Ankle Pain  . Fall   HPI Comments: Kristi Martin is a 43 y.o. female who presents to the Emergency Department complaining of constant, throbbing, left knee and ankle pain that started a week ago after the patient tripped and fell while wearing stilettos at church.  She states that at the time of the fall her ankle went towards the right and her ankle went to the left.  She states that she after her fall she wrapped her right ankle and applied ice to the area.  She states that she is currently walking walking with crutches.     Patient is a 43 y.o. female presenting with knee pain, ankle pain, and fall. The history is provided by the patient. No language interpreter was used.  Knee Pain Ankle Pain Fall  Fall Associated symptoms include arthralgias (left ankle, left knee) and joint swelling.    Past Medical History  Diagnosis Date  . Left knee pain   . Migraine   . Cancer     cervical cx at age 49  . Cervical cancer    Past Surgical History  Procedure Laterality Date  . Tubal ligation    . Cholecystectomy N/A 01/20/2013    Procedure: LAPAROSCOPIC CHOLECYSTECTOMY WITH INTRAOPERATIVE CHOLANGIOGRAM;  Surgeon: Shann Medal, MD;  Location: [facility];  Service: General;  Laterality: N/A;   Family History  Problem Relation Age of Onset  . Cancer Mother   . Colon cancer Mother   . Breast cancer Maternal Grandmother   . Breast cancer Paternal Grandmother    History  Substance Use Topics  . Smoking status: Never Smoker   . Smokeless tobacco: Never Used  . Alcohol Use: No   OB History   Grav Para Term Preterm Abortions TAB SAB Ect Mult Living   10 7             Review of Systems  Musculoskeletal: Positive for arthralgias (left ankle, left knee), gait problem and joint swelling.  Skin: Negative for color change and wound.  All other systems reviewed and are negative.     Allergies  Benadryl and Onion  Home Medications   Current Outpatient Rx  Name  Route  Sig  Dispense  Refill  . aspirin 81 MG chewable tablet   Oral   Chew 1 tablet (81 mg total) by mouth daily.   30 tablet   3   . clindamycin (CLEOCIN) 150 MG capsule   Oral   Take 2 capsules (300 mg total) by mouth 3 (three) times daily.   42 capsule   0   . oxyCODONE-acetaminophen (PERCOCET/ROXICET) 5-325 MG per tablet   Oral   Take 1 tablet by mouth every 4 (four) hours as needed for severe pain.   15 tablet   0    Triage Vitals: BP 102/70  Pulse 83  Temp(Src) 98.2 F (36.8 C) (Oral)  Resp 16  SpO2 99%  LMP 05/07/2013  Physical Exam  Nursing note and vitals reviewed. Constitutional: She is oriented to person, place, and time. She appears well-developed and well-nourished. No distress.  HENT:  Head:  Normocephalic and atraumatic.  Eyes: EOM are normal.  Neck: Normal range of motion.  Cardiovascular: Normal rate.   Pulmonary/Chest: Effort normal. No respiratory distress.  Musculoskeletal: Normal range of motion.       Left knee: She exhibits normal range of motion, no swelling, no effusion, no deformity, no erythema, no LCL laxity, normal patellar mobility and no MCL laxity. Tenderness found. Medial joint line and LCL tenderness noted. No lateral joint line tenderness noted.       Left ankle: She exhibits swelling. She exhibits normal range of motion, no deformity and normal pulse. Tenderness. Lateral malleolus and AITFL tenderness found. Achilles tendon exhibits no pain and no defect.  NV intact distally, no obvious deformities.   Neurological: She is alert and oriented to person, place, and time.  Skin: Skin is warm and dry. No ecchymosis noted.  She is not diaphoretic. No erythema.  Psychiatric: She has a normal mood and affect. Her behavior is normal.    ED Course  Procedures (including critical care time)  DIAGNOSTIC STUDIES: Oxygen Saturation is 99% on room air.    COORDINATION OF CARE: 5:11 PM- Advised the patient to follow-up with an orthopedist for further evaluation for her knee.  Discussed discharging the patient with a knee immobilizer and advised the patient to take Ibuprofen for her pain and swelling.  Also advised the patient to elevate her left ankle and knee and apply ice to it.  The patient agreed to the treatment plan.   Labs Review Labs Reviewed - No data to display Imaging Review Dg Ankle Complete Left  05/14/2013   CLINICAL DATA:  Lateral ankle pain after fall  EXAM: LEFT ANKLE COMPLETE - 3+ VIEW  COMPARISON:  None.  FINDINGS: Lateral ankle soft tissue swelling. No fracture or dislocation. Mortise is intact. No significant joint effusion.  IMPRESSION: Findings most consistent with sprain   Electronically Signed   By: Skipper Cliche M.D.   On: 05/14/2013 16:51   Dg Knee Complete 4 Views Left  05/14/2013   CLINICAL DATA:  Patient fell, lateral knee pain near patella  EXAM: LEFT KNEE - COMPLETE 4+ VIEW  COMPARISON:  None.  FINDINGS: There is no evidence of fracture, dislocation, or joint effusion. There is no evidence of arthropathy or other focal bone abnormality. Soft tissues are unremarkable.  IMPRESSION: Negative.   Electronically Signed   By: Skipper Cliche M.D.   On: 05/14/2013 16:53     EKG Interpretation None      MDM   Final diagnoses:  Ankle sprain  Knee pain, left   Pt seen for fall almost 1 week ago.  Negative knee XR, ankle XR consistent with sprain. Knee exam concerning for meniscal injury. No signs of infected joint. Knee immobilizer, ASO ankle splint, crutches, RICE encouraged. Follow up with Ortho. Discussed imaging results, and treatment plan with the patient. Return precautions given.  Reports understanding and no other concerns at this time.  Patient is stable for discharge at this time.   Meds given in ED:  Medications - No data to display  New Prescriptions   HYDROCODONE-ACETAMINOPHEN (NORCO/VICODIN) 5-325 MG PER TABLET    Take 1 tablet by mouth every 4 (four) hours as needed.   IBUPROFEN (ADVIL,MOTRIN) 800 MG TABLET    Take 1 tablet (800 mg total) by mouth 3 (three) times daily. Take with meals    I personally performed the services described in this documentation, which was scribed in my presence. The recorded information has been  reviewed and is accurate.     Lorrine Kin, PA-C 05/16/13 [place], PA-C 05/16/13 [phone]

## 2013-05-14 NOTE — Discharge Instructions (Signed)
Call for a follow up appointment with a Family or Primary Care Provider.  Return if Symptoms worsen.   Take medication as prescribed.  Ice your knee and ankle 3-4 times a day.  Elevate when you are not walking.

## 2013-05-14 NOTE — ED Notes (Signed)
Pt reports fall 6 days ago. Wearing heels, stepped in grass and heel sunk and pt fell. Pt fell forward, bracing self with arms, twisting L leg. C/o L knee and ankle pain, has been using crutches. Sts it still is too painful to walk on.

## 2013-05-18 NOTE — ED Provider Notes (Signed)
Medical screening examination/treatment/procedure(s) were performed by non-physician practitioner and as supervising physician I was immediately available for consultation/collaboration.   EKG Interpretation None        Mariea Clonts, MD 05/18/13 [phone]

## 2013-06-16 ENCOUNTER — Emergency Department (HOSPITAL_COMMUNITY)
Admission: EM | Admit: 2013-06-16 | Discharge: 2013-06-16 | Disposition: A | Discharge status: Home / Self Care | Payer: Self-pay | Attending: Emergency Medicine | Admitting: Emergency Medicine

## 2013-06-16 ENCOUNTER — Encounter (HOSPITAL_COMMUNITY): Payer: Self-pay | Admitting: Emergency Medicine

## 2013-06-16 DIAGNOSIS — S025XXA Fracture of tooth (traumatic), initial encounter for closed fracture: Secondary | ICD-10-CM | POA: Insufficient documentation

## 2013-06-16 DIAGNOSIS — X58XXXA Exposure to other specified factors, initial encounter: Secondary | ICD-10-CM | POA: Insufficient documentation

## 2013-06-16 DIAGNOSIS — Z8541 Personal history of malignant neoplasm of cervix uteri: Secondary | ICD-10-CM | POA: Insufficient documentation

## 2013-06-16 DIAGNOSIS — K029 Dental caries, unspecified: Secondary | ICD-10-CM | POA: Insufficient documentation

## 2013-06-16 DIAGNOSIS — Z8679 Personal history of other diseases of the circulatory system: Secondary | ICD-10-CM | POA: Insufficient documentation

## 2013-06-16 DIAGNOSIS — Y929 Unspecified place or not applicable: Secondary | ICD-10-CM | POA: Insufficient documentation

## 2013-06-16 DIAGNOSIS — R51 Headache: Secondary | ICD-10-CM | POA: Insufficient documentation

## 2013-06-16 DIAGNOSIS — Y9389 Activity, other specified: Secondary | ICD-10-CM | POA: Insufficient documentation

## 2013-06-16 DIAGNOSIS — Z791 Long term (current) use of non-steroidal anti-inflammatories (NSAID): Secondary | ICD-10-CM | POA: Insufficient documentation

## 2013-06-16 MED ORDER — AMOXICILLIN 500 MG PO CAPS: 500.0000 mg | ORAL_CAPSULE | Freq: Three times a day (TID) | ORAL | Status: DC

## 2013-06-16 MED ORDER — IBUPROFEN 800 MG PO TABS
800.0000 mg | ORAL_TABLET | Freq: Once | ORAL | Status: AC
  Administered 2013-06-16: 800 mg via ORAL
  Filled 2013-06-16: qty 1

## 2013-06-16 MED ORDER — HYDROCODONE-ACETAMINOPHEN 5-325 MG PO TABS: 1.0000 | ORAL_TABLET | ORAL | Status: DC | PRN

## 2013-06-16 MED ORDER — PROMETHAZINE HCL 12.5 MG PO TABS
12.5000 mg | ORAL_TABLET | Freq: Once | ORAL | Status: AC
  Administered 2013-06-16: 12.5 mg via ORAL
  Filled 2013-06-16: qty 1

## 2013-06-16 MED ORDER — PENICILLIN V POTASSIUM 250 MG PO TABS
500.0000 mg | ORAL_TABLET | Freq: Once | ORAL | Status: AC
  Administered 2013-06-16: 500 mg via ORAL
  Filled 2013-06-16: qty 2

## 2013-06-16 MED ORDER — HYDROCODONE-ACETAMINOPHEN 5-325 MG PO TABS
2.0000 | ORAL_TABLET | Freq: Once | ORAL | Status: AC
  Administered 2013-06-16: 2 via ORAL
  Filled 2013-06-16: qty 2

## 2013-06-16 NOTE — ED Notes (Signed)
Pt verbalized understanding of no driving within 4 hours of taking vicodin due to med causes drowsiness  

## 2013-06-16 NOTE — ED Provider Notes (Signed)
CSN: [id number]     Arrival date & time 06/16/13  1453 History   First MD Initiated Contact with Patient 06/16/13 1525     Chief Complaint  Patient presents with  . Dental Pain     (Consider location/radiation/quality/duration/timing/severity/associated sxs/prior Treatment) Patient is a 43 y.o. female presenting with tooth pain. The history is provided by the patient.  Dental Pain Location:  Upper Upper teeth location:  6/RU cuspid Quality:  Throbbing and shooting Severity:  Severe Onset quality:  Sudden Timing:  Constant Progression:  Worsening Chronicity:  New Context: dental caries and dental fracture   Context comment:  Pt was eating peanuts and broke a tooth Relieved by:  Nothing Worsened by:  Hot food/drink Associated symptoms: headaches   Associated symptoms: no difficulty swallowing, no facial swelling and no neck pain   Risk factors: no diabetes     Past Medical History  Diagnosis Date  . Left knee pain   . Migraine   . Cancer     cervical cx at age 46  . Cervical cancer    Past Surgical History  Procedure Laterality Date  . Tubal ligation    . Cholecystectomy N/A 01/20/2013    Procedure: LAPAROSCOPIC CHOLECYSTECTOMY WITH INTRAOPERATIVE CHOLANGIOGRAM;  Surgeon: Shann Medal, MD;  Location: [facility];  Service: General;  Laterality: N/A;   Family History  Problem Relation Age of Onset  . Cancer Mother   . Colon cancer Mother   . Breast cancer Maternal Grandmother   . Breast cancer Paternal Grandmother    History  Substance Use Topics  . Smoking status: Never Smoker   . Smokeless tobacco: Never Used  . Alcohol Use: No   OB History   Grav Para Term Preterm Abortions TAB SAB Ect Mult Living   10 7             Review of Systems  Constitutional: Negative for activity change.       All ROS Neg except as noted in HPI  HENT: Positive for dental problem. Negative for facial swelling and nosebleeds.   Eyes: Negative for photophobia and discharge.   Respiratory: Negative for cough, shortness of breath and wheezing.   Cardiovascular: Negative for chest pain and palpitations.  Gastrointestinal: Negative for abdominal pain and blood in stool.  Genitourinary: Negative for dysuria, frequency and hematuria.  Musculoskeletal: Negative for arthralgias, back pain and neck pain.  Skin: Negative.   Neurological: Positive for headaches. Negative for dizziness, seizures and speech difficulty.  Psychiatric/Behavioral: Negative for hallucinations and confusion.      Allergies  Benadryl and Onion  Home Medications   Prior to Admission medications   Medication Sig Start Date End Date Taking? Authorizing Provider  acetaminophen (TYLENOL) 500 MG tablet Take 1,000 mg by mouth every 6 (six) hours as needed for moderate pain.   Yes Historical Provider, MD  ibuprofen (ADVIL,MOTRIN) 200 MG tablet Take 800 mg by mouth every 6 (six) hours as needed for moderate pain.   Yes Historical Provider, MD   BP 114/79  Pulse 95  Temp(Src) 97.1 F (36.2 C) (Oral)  Resp 18  Ht 5\' 8"  (1.727 m)  Wt 250 lb (113.399 kg)  BMI 38.02 kg/m2  SpO2 100%  LMP 06/14/2013 Physical Exam  Nursing note and vitals reviewed. Constitutional: She is oriented to person, place, and time. She appears well-developed and well-nourished.  Non-toxic appearance.  HENT:  Head: Normocephalic.  Right Ear: Tympanic membrane and external ear normal.  Left Ear: Tympanic membrane  and external ear normal.  Multiple dental caries. The right upper canine is broken. Some swelling of the gum. No visible abscess. No swelling under the tongue.  Eyes: EOM and lids are normal. Pupils are equal, round, and reactive to light.  Neck: Normal range of motion. Neck supple. Carotid bruit is not present.  Cardiovascular: Normal rate, regular rhythm, normal heart sounds, intact distal pulses and normal pulses.   Pulmonary/Chest: Breath sounds normal. No respiratory distress.  Abdominal: Soft. Bowel sounds  are normal. There is no tenderness. There is no guarding.  Musculoskeletal: Normal range of motion.  Lymphadenopathy:       Head (right side): No submandibular adenopathy present.       Head (left side): No submandibular adenopathy present.    She has no cervical adenopathy.  Neurological: She is alert and oriented to person, place, and time. She has normal strength. No cranial nerve deficit or sensory deficit.  Skin: Skin is warm and dry.  Psychiatric: She has a normal mood and affect. Her speech is normal.    ED Course  Procedures (including critical care time) Labs Review Labs Reviewed - No data to display  Imaging Review No results found.   EKG Interpretation None      MDM Patient states that she has had problems with cavities for some time. She was eating and had a 2 to break at one of the cavity sites. There is no evidence for abscess. No evidence for Ludwig's angina. Vital signs are well within normal limits.  The patient will be treated with Amoxil and Norco. Patient strongly advised to see a dentist as soon as possible.   OTC artificial filling also suggested to the patient..   Final diagnoses:  None    **I have reviewed nursing notes, vital signs, and all appropriate lab and imaging results for this patient.Lenox Ahr, PA-C 06/20/13 [phone]

## 2013-06-16 NOTE — Discharge Instructions (Signed)
It is important that you see a dentist as soon as possible concerning the broken tooth. Please use the artificial/temporary filling from the drugstore to protect the tooth from air. Use 600 mg of ibuprofen 3 times daily. Use Amoxil 3 times daily until all taken. May use Norco for pain. This medication may cause drowsiness, please use with caution. Dental Fracture You have a dental fracture or injury. This can mean the tooth is loose, has a chip in the enamel or is broken. If just the outer enamel is chipped, there is a good chance the tooth will not become infected. The only treatment needed may be to smooth off a rough edge. Fractures into the deeper layers (dentin and pulp) cause greater pain and are more likely to become infected. These require you to see a dentist as soon as possible to save the tooth. Loose teeth may need to be wired or bonded with a plastic splint to hold them in place. A paste may be painted on the open area of the broken tooth to reduce the pain. Antibiotics and pain medicine may be prescribed. Choosing a soft or liquid diet and rinsing the mouth out with warm water after meals may be helpful. See your dentist as recommended. Failure to seek care or follow up with a dentist or other specialist as recommended could result in the loss of your tooth, infection, or permanent dental problems. SEEK MEDICAL CARE IF:   You have increased pain not controlled with medicines.  You have swelling around the tooth, in the face or neck.  You have bleeding which starts, continues, or gets worse.  You have a fever. Document Released: 02/28/2004 Document Revised: 04/14/2011 Document Reviewed: 12/12/2008 [facility] Patient Information 2014 [place], [place].  Dental Caries Dental caries is tooth decay. This decay can cause a hole in teeth (cavity) that can get bigger and deeper over time. HOME CARE  Brush and floss your teeth. Do this at least two times a day.  Use a fluoride  toothpaste.  Use a mouth rinse if told by your dentist or doctor.  Eat less sugary and starchy foods. Drink less sugary drinks.  Avoid snacking often on sugary and starchy foods. Avoid sipping often on sugary drinks.  Keep regular checkups and cleanings with your dentist.  Use fluoride supplements if told by your dentist or doctor.  Allow fluoride to be applied to teeth if told by your dentist or doctor. MAKE SURE YOU:  Understand these instructions.  Will watch your condition.  Will get help right away if you are not doing well or get worse. Document Released: 10/30/2007 Document Revised: 09/22/2012 Document Reviewed: 01/23/2012 [facility] Patient Information 2014 [place], [place].

## 2013-06-16 NOTE — ED Notes (Signed)
Pt states she was eating something and her front tooth broke off

## 2013-06-22 NOTE — ED Provider Notes (Signed)
  Medical screening examination/treatment/procedure(s) were performed by non-physician practitioner and as supervising physician I was immediately available for consultation/collaboration.   EKG Interpretation None         Carmin Muskrat, MD 06/22/13 [phone]

## 2013-08-03 ENCOUNTER — Emergency Department (HOSPITAL_COMMUNITY)
Admission: EM | Admit: 2013-08-03 | Discharge: 2013-08-03 | Disposition: A | Discharge status: Home / Self Care | Payer: Self-pay | Attending: Emergency Medicine | Admitting: Emergency Medicine

## 2013-08-03 ENCOUNTER — Encounter (HOSPITAL_COMMUNITY): Payer: Self-pay | Admitting: Emergency Medicine

## 2013-08-03 DIAGNOSIS — Z792 Long term (current) use of antibiotics: Secondary | ICD-10-CM | POA: Insufficient documentation

## 2013-08-03 DIAGNOSIS — M722 Plantar fascial fibromatosis: Secondary | ICD-10-CM

## 2013-08-03 DIAGNOSIS — G5600 Carpal tunnel syndrome, unspecified upper limb: Secondary | ICD-10-CM | POA: Insufficient documentation

## 2013-08-03 DIAGNOSIS — Z8541 Personal history of malignant neoplasm of cervix uteri: Secondary | ICD-10-CM | POA: Insufficient documentation

## 2013-08-03 DIAGNOSIS — Z79899 Other long term (current) drug therapy: Secondary | ICD-10-CM | POA: Insufficient documentation

## 2013-08-03 DIAGNOSIS — Z8679 Personal history of other diseases of the circulatory system: Secondary | ICD-10-CM | POA: Insufficient documentation

## 2013-08-03 DIAGNOSIS — G5602 Carpal tunnel syndrome, left upper limb: Secondary | ICD-10-CM

## 2013-08-03 MED ORDER — DICLOFENAC SODIUM 75 MG PO TBEC: 75.0000 mg | DELAYED_RELEASE_TABLET | Freq: Two times a day (BID) | ORAL | Status: DC

## 2013-08-03 MED ORDER — HYDROCODONE-ACETAMINOPHEN 5-325 MG PO TABS: ORAL_TABLET | ORAL | Status: DC

## 2013-08-03 NOTE — ED Notes (Signed)
Patient c/o bilateral ankle and left hand pain that woke her last night. Patient reports pain as burning and tingling. Patient states "It feels like pins and needles." Per patient tingling sensation is constant but sharp, stabbing pain is intermittent.

## 2013-08-03 NOTE — ED Provider Notes (Signed)
Medical screening examination/treatment/procedure(s) were performed by non-physician practitioner and as supervising physician I was immediately available for consultation/collaboration.   EKG Interpretation None      Rolland Porter, MD, Abram Sander   Janice Norrie, MD 08/03/13 [phone]

## 2013-08-03 NOTE — ED Provider Notes (Signed)
CSN: [id number]     Arrival date & time 08/03/13  7829 History   First MD Initiated Contact with Patient 08/03/13 0901     Chief Complaint  Patient presents with  . Hand Pain  . Ankle Pain     (Consider location/radiation/quality/duration/timing/severity/associated sxs/prior Treatment) Patient is a 43 y.o. female presenting with hand pain and ankle pain. The history is provided by the patient.  Hand Pain This is a new problem. The current episode started today (several hours ago). The problem occurs constantly. The problem has been unchanged. Associated symptoms include arthralgias and numbness. Pertinent negatives include no abdominal pain, chest pain, chills, fever, headaches, joint swelling, myalgias, nausea, neck pain, rash, sore throat, vertigo, visual change, vomiting or weakness. Associated symptoms comments: "pins and needles" sensation to bilateral ankle and left hand. The symptoms are aggravated by bending, standing and walking. She has tried nothing for the symptoms. The treatment provided no relief.  Ankle Pain Location:  Ankle Injury: no   Ankle location:  L ankle and R ankle Pain details:    Quality:  Tingling and sharp (Pins and needles sensation)   Radiates to:  Does not radiate   Severity:  Mild   Onset quality:  Sudden   Timing:  Constant Chronicity:  Recurrent Dislocation: no   Foreign body present:  No foreign bodies Prior injury to area:  No Relieved by:  Nothing Worsened by:  Activity and bearing weight Ineffective treatments:  None tried Associated symptoms: tingling   Associated symptoms: no back pain, no decreased ROM, no fever, no neck pain, no numbness and no swelling    patient reports "pins and needles" sensation to the third, fourth and fifth fingers of the left hand and similar sensation to the bilateral ankles and arches of the feet.  s'x s began suddenly during the night.  She reports having similar sx's to the left hand in the past.  She denies  swelling, discoloration or rash, chest pain, shortness of breath, or radiation of her symptoms.  No h/o DM or injury  Past Medical History  Diagnosis Date  . Left knee pain   . Migraine   . Cancer     cervical cx at age 4  . Cervical cancer    Past Surgical History  Procedure Laterality Date  . Tubal ligation    . Cholecystectomy N/A 01/20/2013    Procedure: LAPAROSCOPIC CHOLECYSTECTOMY WITH INTRAOPERATIVE CHOLANGIOGRAM;  Surgeon: Shann Medal, MD;  Location: [facility];  Service: General;  Laterality: N/A;   Family History  Problem Relation Age of Onset  . Cancer Mother   . Colon cancer Mother   . Breast cancer Maternal Grandmother   . Breast cancer Paternal Grandmother    History  Substance Use Topics  . Smoking status: Never Smoker   . Smokeless tobacco: Never Used  . Alcohol Use: No   OB History   Grav Para Term Preterm Abortions TAB SAB Ect Mult Living   10 7 6 1      7      Review of Systems  Constitutional: Negative for fever and chills.  HENT: Negative for sore throat.   Respiratory: Negative for chest tightness and shortness of breath.   Cardiovascular: Negative for chest pain, palpitations and leg swelling.  Gastrointestinal: Negative for nausea, vomiting and abdominal pain.  Genitourinary: Negative for dysuria and difficulty urinating.  Musculoskeletal: Positive for arthralgias. Negative for back pain, gait problem, joint swelling, myalgias, neck pain and neck stiffness.  Pins and needles sensation to the left hand and bilateral ankles  Skin: Negative for color change, rash and wound.  Neurological: Positive for numbness. Negative for dizziness, vertigo, syncope, facial asymmetry, speech difficulty, weakness, light-headedness and headaches.  All other systems reviewed and are negative.     Allergies  Benadryl and Onion  Home Medications   Prior to Admission medications   Medication Sig Start Date End Date Taking? Authorizing Provider   acetaminophen (TYLENOL) 500 MG tablet Take 1,000 mg by mouth every 6 (six) hours as needed for moderate pain.    Historical Provider, MD  amoxicillin (AMOXIL) 500 MG capsule Take 1 capsule (500 mg total) by mouth 3 (three) times daily. 06/16/13   Lenox Ahr, PA-C  HYDROcodone-acetaminophen (NORCO/VICODIN) 5-325 MG per tablet Take 1 tablet by mouth every 4 (four) hours as needed for moderate pain. 06/16/13   Lenox Ahr, PA-C  ibuprofen (ADVIL,MOTRIN) 200 MG tablet Take 800 mg by mouth every 6 (six) hours as needed for moderate pain.    Historical Provider, MD   BP 124/61  Pulse 89  Temp(Src) 97.4 F (36.3 C) (Oral)  Ht 5\' 5"  (1.651 m)  Wt 260 lb (117.935 kg)  BMI 43.27 kg/m2  SpO2 98%  LMP 07/13/2013 Physical Exam  Nursing note and vitals reviewed. Constitutional: She is oriented to person, place, and time. She appears well-developed and well-nourished. No distress.  HENT:  Head: Normocephalic and atraumatic.  Neck: Normal range of motion. Neck supple.  Cardiovascular: Normal rate, regular rhythm, normal heart sounds and intact distal pulses.   No murmur heard. Pulmonary/Chest: Effort normal and breath sounds normal. No respiratory distress. She exhibits no tenderness.  Musculoskeletal: Normal range of motion. She exhibits tenderness. She exhibits no edema.  ttp along the dorsal ankles and arches of the feet bilaterally.  ROM is preserved.  DP pulses are brisk,distal sensation intact.  No erythema, abrasion, bruising or bony deformity.  No proximal tenderness. Compartments of the LE's are soft.  Pt also has positive Tinel's sign, gross sensation to the distal fingers intact, radial pulse brisk, no erythema, CR< 2 sec.     Lymphadenopathy:    She has no cervical adenopathy.  Neurological: She is alert and oriented to person, place, and time. She exhibits normal muscle tone. Coordination normal.  Skin: Skin is warm and dry.    ED Course  Procedures (including critical care  time) Labs Review Labs Reviewed - No data to display  Imaging Review No results found.   EKG Interpretation None      MDM   Final diagnoses:  None    Patient is well appearing. Vital signs are stable. Intermittent aesthesias to the left hand and bilateral feet. No focal neurological deficits on exam. Patient ambulates with a steady gait. Symptoms are likely related to carpal tunnel of the left hand.  Pt is also obese and wears flat shoes which may be cause of plantar fasciitis.  Patient agrees to symptomatic treatment, also give her referral for Dr. Aline Brochure.  ASO applied to the left ankle and cock up wrist splint.  Pain improved, remains NV intact.  Patient appears stable for discharge and agrees to care plan    Dareen Gutzwiller L. Vanessa [place] Holiday, PA-C 08/03/13 8757

## 2013-08-03 NOTE — Discharge Instructions (Signed)
Carpal Tunnel Syndrome The carpal tunnel is an area under the skin of the palm of your hand. Nerves, blood vessels, and strong tissues (tendons) pass through the tunnel. The tunnel can become puffy (swollen). If this happens, a nerve can be pinched in the wrist. This causes carpal tunnel syndrome.  HOME CARE  Take all medicine as told by your doctor.  If you were given a splint, wear it as told. Wear it at night or at times when your doctor told you to.  Rest your wrist from the activity that causes your pain.  Put ice on your wrist after long periods of wrist activity.  Put ice in a plastic bag.  Place a towel between your skin and the bag.  Leave the ice on for 15-20 minutes, 03-04 times a day.  Keep all doctor visits as told. GET HELP RIGHT AWAY IF:  You have new problems you cannot explain.  Your problems get worse and medicine does not help. MAKE SURE YOU:   Understand these instructions.  Will watch your condition.  Will get help right away if you are not doing well or get worse. Document Released: 01/09/2011 Document Revised: 04/14/2011 Document Reviewed: 01/09/2011 [facility] Patient Information 2015 [place], [place]. This information is not intended to replace advice given to you by your health care provider. Make sure you discuss any questions you have with your health care provider.  Plantar Fasciitis Plantar fasciitis is a common condition that causes foot pain. It is soreness (inflammation) of the band of tough fibrous tissue on the bottom of the foot that runs from the heel bone (calcaneus) to the ball of the foot. The cause of this soreness may be from excessive standing, poor fitting shoes, running on hard surfaces, being overweight, having an abnormal walk, or overuse (this is common in runners) of the painful foot or feet. It is also common in aerobic exercise dancers and ballet dancers. SYMPTOMS  Most people with plantar fasciitis complain of:  Severe pain in  the morning on the bottom of their foot especially when taking the first steps out of bed. This pain recedes after a few minutes of walking.  Severe pain is experienced also during walking following a long period of inactivity.  Pain is worse when walking barefoot or up stairs DIAGNOSIS   Your caregiver will diagnose this condition by examining and feeling your foot.  Special tests such as X-rays of your foot, are usually not needed. PREVENTION   Consult a sports medicine professional before beginning a new exercise program.  Walking programs offer a good workout. With walking there is a lower chance of overuse injuries common to runners. There is less impact and less jarring of the joints.  Begin all new exercise programs slowly. If problems or pain develop, decrease the amount of time or distance until you are at a comfortable level.  Wear good shoes and replace them regularly.  Stretch your foot and the heel cords at the back of the ankle (Achilles tendon) both before and after exercise.  Run or exercise on even surfaces that are not hard. For example, asphalt is better than pavement.  Do not run barefoot on hard surfaces.  If using a treadmill, vary the incline.  Do not continue to workout if you have foot or joint problems. Seek professional help if they do not improve. HOME CARE INSTRUCTIONS   Avoid activities that cause you pain until you recover.  Use ice or cold packs on the problem  or painful areas after working out.  Only take over-the-counter or prescription medicines for pain, discomfort, or fever as directed by your caregiver.  Soft shoe inserts or athletic shoes with air or gel sole cushions may be helpful.  If problems continue or become more severe, consult a sports medicine caregiver or your own health care provider. Cortisone is a potent anti-inflammatory medication that may be injected into the painful area. You can discuss this treatment with your  caregiver. MAKE SURE YOU:   Understand these instructions.  Will watch your condition.  Will get help right away if you are not doing well or get worse. Document Released: 10/15/2000 Document Revised: 04/14/2011 Document Reviewed: 12/15/2007 [facility] Patient Information 2015 [place], [place]. This information is not intended to replace advice given to you by your health care provider. Make sure you discuss any questions you have with your health care provider.

## 2013-08-22 ENCOUNTER — Emergency Department (HOSPITAL_COMMUNITY)
Admission: EM | Admit: 2013-08-22 | Discharge: 2013-08-22 | Disposition: A | Discharge status: Home / Self Care | Payer: Self-pay | Attending: Emergency Medicine | Admitting: Emergency Medicine

## 2013-08-22 ENCOUNTER — Encounter (HOSPITAL_COMMUNITY): Payer: Self-pay | Admitting: Emergency Medicine

## 2013-08-22 DIAGNOSIS — M5416 Radiculopathy, lumbar region: Secondary | ICD-10-CM

## 2013-08-22 DIAGNOSIS — M79609 Pain in unspecified limb: Secondary | ICD-10-CM | POA: Insufficient documentation

## 2013-08-22 DIAGNOSIS — R202 Paresthesia of skin: Secondary | ICD-10-CM

## 2013-08-22 DIAGNOSIS — G43909 Migraine, unspecified, not intractable, without status migrainosus: Secondary | ICD-10-CM | POA: Insufficient documentation

## 2013-08-22 DIAGNOSIS — Z792 Long term (current) use of antibiotics: Secondary | ICD-10-CM | POA: Insufficient documentation

## 2013-08-22 DIAGNOSIS — R209 Unspecified disturbances of skin sensation: Secondary | ICD-10-CM | POA: Insufficient documentation

## 2013-08-22 DIAGNOSIS — Z791 Long term (current) use of non-steroidal anti-inflammatories (NSAID): Secondary | ICD-10-CM | POA: Insufficient documentation

## 2013-08-22 DIAGNOSIS — Z8541 Personal history of malignant neoplasm of cervix uteri: Secondary | ICD-10-CM | POA: Insufficient documentation

## 2013-08-22 DIAGNOSIS — IMO0002 Reserved for concepts with insufficient information to code with codable children: Secondary | ICD-10-CM | POA: Insufficient documentation

## 2013-08-22 MED ORDER — OXYCODONE-ACETAMINOPHEN 5-325 MG PO TABS: 1.0000 | ORAL_TABLET | ORAL | Status: DC | PRN

## 2013-08-22 MED ORDER — OXYCODONE-ACETAMINOPHEN 5-325 MG PO TABS
2.0000 | ORAL_TABLET | Freq: Once | ORAL | Status: AC
  Administered 2013-08-22: 2 via ORAL
  Filled 2013-08-22: qty 2

## 2013-08-22 MED ORDER — NAPROXEN 500 MG PO TABS: 500.0000 mg | ORAL_TABLET | Freq: Two times a day (BID) | ORAL | Status: DC

## 2013-08-22 MED ORDER — CYCLOBENZAPRINE HCL 10 MG PO TABS
10.0000 mg | ORAL_TABLET | Freq: Once | ORAL | Status: AC
  Administered 2013-08-22: 10 mg via ORAL
  Filled 2013-08-22: qty 1

## 2013-08-22 NOTE — ED Provider Notes (Signed)
CSN: [id number]     Arrival date & time 08/22/13  0955 History   This chart was scribed for non-physician practitioner Kem Parkinson, PA-C working with [name], MD, by Thea Alken, ED Scribe. This patient was seen in room APFT23/APFT23 and the patient's care was started at 11:09 AM. Chief Complaint  Patient presents with  . Leg Pain   Patient is a 43 y.o. female presenting with leg pain. The history is provided by the patient. No language interpreter was used.  Leg Pain Associated symptoms: back pain   Associated symptoms: no fatigue and no fever     Kristi Martin is a 43 y.o. Female who presents to the Emergency Department complaining of worsening, constant, burning, cramping, bilateral leg pain onset 3 days. Pt denies known injury. She reports burning started 3 days ago but pain worsened last night.  Pt reports pain radiates from lower back to bilateral legs and paresthesia in bilateral feet and is similar to previous episodes. She denies taking pain medication, taking fluid and blood thinners, swelling or discoloration to her LE's  Past Medical History  Diagnosis Date  . Left knee pain   . Migraine   . Cancer     cervical cx at age 71  . Cervical cancer    Past Surgical History  Procedure Laterality Date  . Tubal ligation    . Cholecystectomy N/A 01/20/2013    Procedure: LAPAROSCOPIC CHOLECYSTECTOMY WITH INTRAOPERATIVE CHOLANGIOGRAM;  Surgeon: Shann Medal, MD;  Location: [facility];  Service: General;  Laterality: N/A;   Family History  Problem Relation Age of Onset  . Cancer Mother   . Colon cancer Mother   . Breast cancer Maternal Grandmother   . Breast cancer Paternal Grandmother    History  Substance Use Topics  . Smoking status: Never Smoker   . Smokeless tobacco: Never Used  . Alcohol Use: No   OB History   Grav Para Term Preterm Abortions TAB SAB Ect Mult Living   10 7 6 1      7      Review of Systems  Constitutional: Negative for fever, chills,  diaphoresis and fatigue.  Respiratory: Negative for shortness of breath.   Gastrointestinal: Negative for nausea, vomiting, abdominal pain, diarrhea, constipation and abdominal distention.  Genitourinary: Negative for dysuria, urgency, frequency, hematuria, flank pain, decreased urine volume and difficulty urinating.       No perineal numbness or incontinence of urine or feces  Musculoskeletal: Positive for back pain and myalgias. Negative for arthralgias and joint swelling.  Skin: Negative for rash.  Neurological: Negative for weakness and numbness.  All other systems reviewed and are negative.  Allergies  Benadryl and Onion  Home Medications   Prior to Admission medications   Medication Sig Start Date End Date Taking? Authorizing Provider  acetaminophen (TYLENOL) 500 MG tablet Take 1,000 mg by mouth every 6 (six) hours as needed for moderate pain.    Historical Provider, MD  amoxicillin (AMOXIL) 500 MG capsule Take 1 capsule (500 mg total) by mouth 3 (three) times daily. 06/16/13   Lenox Ahr, PA-C  diclofenac (VOLTAREN) 75 MG EC tablet Take 1 tablet (75 mg total) by mouth 2 (two) times daily. Take with food 08/03/13   Isahi Godwin L. Rebekka Lobello, PA-C  HYDROcodone-acetaminophen (NORCO/VICODIN) 5-325 MG per tablet Take 1 tablet by mouth every 4 (four) hours as needed for moderate pain. 06/16/13   Lenox Ahr, PA-C  HYDROcodone-acetaminophen (NORCO/VICODIN) 5-325 MG per tablet Take one-two tabs  po q 4-6 hrs prn pain 08/03/13   Leilani Cespedes L. Korey Arroyo, PA-C  ibuprofen (ADVIL,MOTRIN) 200 MG tablet Take 800 mg by mouth every 6 (six) hours as needed for moderate pain.    Historical Provider, MD   Triage Vitals-BP 103/58  Pulse 86  Temp(Src) 98.4 F (36.9 C) (Oral)  Resp 18  Ht 5\' 5"  (1.651 m)  Wt 251 lb (113.853 kg)  BMI 41.77 kg/m2  SpO2 99%  LMP 08/21/2013 Physical Exam  Nursing note and vitals reviewed. Constitutional: She is oriented to person, place, and time. She appears well-developed and  well-nourished. No distress.  HENT:  Head: Normocephalic and atraumatic.  Eyes: Conjunctivae and EOM are normal.  Neck: Normal range of motion. Neck supple.  Cardiovascular: Normal rate, regular rhythm, normal heart sounds and intact distal pulses.  Exam reveals no gallop and no friction rub.   No murmur heard. Pulmonary/Chest: Effort normal and breath sounds normal. No respiratory distress. She has no wheezes. She has no rales. She exhibits no tenderness.  Abdominal: Soft. She exhibits no distension. There is no tenderness.  Musculoskeletal: Normal range of motion. She exhibits tenderness. She exhibits no edema.       Lumbar back: She exhibits tenderness and pain. She exhibits normal range of motion, no swelling, no deformity, no laceration and normal pulse.  TTP lower lumbar paraspinal muscle and bilateral SI joints. DP pulses are brisk bilaterally, gross sensation intact. No calf pain or edema. Pt has 5/5 strength against resistance of bilateral LE's.    Neurological: She is alert and oriented to person, place, and time. She has normal strength. No sensory deficit. She exhibits normal muscle tone. Coordination and gait normal.  Reflex Scores:      Patellar reflexes are 2+ on the right side and 2+ on the left side.      Achilles reflexes are 2+ on the right side and 2+ on the left side. Skin: Skin is warm and dry. No rash noted.  Psychiatric: She has a normal mood and affect. Her behavior is normal.    ED Course  Procedures DIAGNOSTIC STUDIES: Oxygen Saturation is 99% on RA, normal by my interpretation.    COORDINATION OF CARE: 11:14 AM- Pt advised of plan for treatment and pt agrees.  Labs Review Labs Reviewed - No data to display  Imaging Review No results found.   EKG Interpretation None      MDM   Final diagnoses:  Lumbar radicular pain  Paresthesia of both lower extremities   Pt ambulated to the restroom with steady gait.  Sx's improved after medications.  No  concerning sx's for emergent neurological or infectious process.  Pt agrees to symptomatic treatment with NSAID, pain medication and [facility].  She appears stable for d/c.     I personally performed the services described in this documentation, which was scribed in my presence. The recorded information has been reviewed and is accurate.      Paidyn Mcferran L. Vanessa Liberty, PA-C 08/25/13 1312

## 2013-08-22 NOTE — ED Notes (Signed)
PT c/o burning/numbness and pain worsening for one week to bilateral lower extremities. PT denies any injury. No edema noted. PT c/o difficulty with ambulation.

## 2013-08-22 NOTE — Discharge Instructions (Signed)

## 2013-09-02 NOTE — ED Provider Notes (Signed)
Medical screening examination/treatment/procedure(s) were performed by non-physician practitioner and as supervising physician I was immediately available for consultation/collaboration.   EKG Interpretation None        [name], MD 09/02/13 [phone]

## 2013-09-15 ENCOUNTER — Encounter (HOSPITAL_COMMUNITY): Payer: Self-pay | Admitting: Emergency Medicine

## 2013-09-15 ENCOUNTER — Emergency Department (HOSPITAL_COMMUNITY)
Admission: EM | Admit: 2013-09-15 | Discharge: 2013-09-15 | Disposition: A | Discharge status: Home / Self Care | Payer: Self-pay | Attending: Emergency Medicine | Admitting: Emergency Medicine

## 2013-09-15 DIAGNOSIS — R209 Unspecified disturbances of skin sensation: Secondary | ICD-10-CM | POA: Insufficient documentation

## 2013-09-15 DIAGNOSIS — R21 Rash and other nonspecific skin eruption: Secondary | ICD-10-CM | POA: Insufficient documentation

## 2013-09-15 DIAGNOSIS — Y929 Unspecified place or not applicable: Secondary | ICD-10-CM | POA: Insufficient documentation

## 2013-09-15 DIAGNOSIS — W57XXXA Bitten or stung by nonvenomous insect and other nonvenomous arthropods, initial encounter: Secondary | ICD-10-CM

## 2013-09-15 DIAGNOSIS — G589 Mononeuropathy, unspecified: Secondary | ICD-10-CM | POA: Insufficient documentation

## 2013-09-15 DIAGNOSIS — Z8679 Personal history of other diseases of the circulatory system: Secondary | ICD-10-CM | POA: Insufficient documentation

## 2013-09-15 DIAGNOSIS — Y939 Activity, unspecified: Secondary | ICD-10-CM | POA: Insufficient documentation

## 2013-09-15 DIAGNOSIS — IMO0001 Reserved for inherently not codable concepts without codable children: Secondary | ICD-10-CM | POA: Insufficient documentation

## 2013-09-15 DIAGNOSIS — S90569A Insect bite (nonvenomous), unspecified ankle, initial encounter: Secondary | ICD-10-CM | POA: Insufficient documentation

## 2013-09-15 DIAGNOSIS — Z8541 Personal history of malignant neoplasm of cervix uteri: Secondary | ICD-10-CM | POA: Insufficient documentation

## 2013-09-15 DIAGNOSIS — G629 Polyneuropathy, unspecified: Secondary | ICD-10-CM

## 2013-09-15 LAB — CBC WITH DIFFERENTIAL/PLATELET
Basophils Absolute: 0 10*3/uL (ref 0.0–0.1)
Basophils Relative: 0 % (ref 0–1)
Eosinophils Absolute: 0.1 10*3/uL (ref 0.0–0.7)
Eosinophils Relative: 1 % (ref 0–5)
HCT: 37.1 % (ref 36.0–46.0)
HEMOGLOBIN: 12.2 g/dL (ref 12.0–15.0)
LYMPHS PCT: 40 % (ref 12–46)
Lymphs Abs: 3.2 10*3/uL (ref 0.7–4.0)
MCH: 27.3 pg (ref 26.0–34.0)
MCHC: 32.9 g/dL (ref 30.0–36.0)
MCV: 83 fL (ref 78.0–100.0)
MONOS PCT: 12 % (ref 3–12)
Monocytes Absolute: 1 10*3/uL (ref 0.1–1.0)
NEUTROS ABS: 3.8 10*3/uL (ref 1.7–7.7)
Neutrophils Relative %: 47 % (ref 43–77)
Platelets: 269 10*3/uL (ref 150–400)
RBC: 4.47 MIL/uL (ref 3.87–5.11)
RDW: 15.4 % (ref 11.5–15.5)
WBC: 8.1 10*3/uL (ref 4.0–10.5)

## 2013-09-15 LAB — BASIC METABOLIC PANEL
Anion gap: 12 (ref 5–15)
BUN: 7 mg/dL (ref 6–23)
CHLORIDE: 106 meq/L (ref 96–112)
CO2: 24 mEq/L (ref 19–32)
Calcium: 8 mg/dL — ABNORMAL LOW (ref 8.4–10.5)
Creatinine, Ser: 1.18 mg/dL — ABNORMAL HIGH (ref 0.50–1.10)
GFR calc Af Amer: 64 mL/min — ABNORMAL LOW (ref >90)
GFR calc non Af Amer: 55 mL/min — ABNORMAL LOW (ref >90)
Glucose, Bld: 71 mg/dL (ref 70–99)
POTASSIUM: 3.8 meq/L (ref 3.7–5.3)
Sodium: 142 mEq/L (ref 137–147)

## 2013-09-15 MED ORDER — MORPHINE SULFATE 4 MG/ML IJ SOLN
4.0000 mg | Freq: Once | INTRAMUSCULAR | Status: AC
  Administered 2013-09-15: 4 mg via INTRAVENOUS
  Filled 2013-09-15: qty 1

## 2013-09-15 MED ORDER — HYDROXYZINE HCL 25 MG PO TABS
50.0000 mg | ORAL_TABLET | Freq: Once | ORAL | Status: AC
  Administered 2013-09-15: 50 mg via ORAL

## 2013-09-15 MED ORDER — HYDROCODONE-ACETAMINOPHEN 5-325 MG PO TABS: 1.0000 | ORAL_TABLET | ORAL | Status: DC | PRN

## 2013-09-15 MED ORDER — DIPHENHYDRAMINE HCL 50 MG/ML IJ SOLN
25.0000 mg | Freq: Once | INTRAMUSCULAR | Status: DC
  Filled 2013-09-15: qty 1

## 2013-09-15 MED ORDER — HYDROXYZINE HCL 25 MG PO TABS
ORAL_TABLET | ORAL | Status: AC
  Filled 2013-09-15: qty 2

## 2013-09-15 MED ORDER — HYDROXYZINE HCL 25 MG PO TABS: 25.0000 mg | ORAL_TABLET | Freq: Four times a day (QID) | ORAL | Status: DC

## 2013-09-15 MED ORDER — SODIUM CHLORIDE 0.9 % IV BOLUS (SEPSIS)
250.0000 mL | Freq: Once | INTRAVENOUS | Status: AC
  Administered 2013-09-15: 250 mL via INTRAVENOUS

## 2013-09-15 MED ORDER — ONDANSETRON HCL 4 MG/2ML IJ SOLN
4.0000 mg | Freq: Once | INTRAMUSCULAR | Status: AC
  Administered 2013-09-15: 4 mg via INTRAVENOUS
  Filled 2013-09-15: qty 2

## 2013-09-15 NOTE — ED Notes (Signed)
Pt reports around 10 am this am both feet began to burn and pt noticed three bumps on back of left arm. Pt reports generalized burning and itching. Airway patent. nad noted. Pt anxious in triage.

## 2013-09-15 NOTE — ED Provider Notes (Signed)
CSN: [id number]     Arrival date & time 09/15/13  85 History   First MD Initiated Contact with Patient 09/15/13 1601     Chief Complaint  Patient presents with  . Rash     (Consider location/radiation/quality/duration/timing/severity/associated sxs/prior Treatment) HPI Comments: Patient presents to the ER for evaluation of rash as well as lower extremity pain. Patient reports that she first noticed 2 bumps on the back of her arm and this morning. She has noticed spots on both arms now. His ears are red, raised and extremely itchy. She has not taken anything for this.  Patient reports that after she noticed the bump she started to notice a burning, tingling pain in both of her feet and lower legs. This has been constant since its onset. He worsens if she tries to stand up or putting on her feet. She denies injury.  Patient is a 43 y.o. female presenting with rash.  Rash   Past Medical History  Diagnosis Date  . Left knee pain   . Migraine   . Cancer     cervical cx at age 17  . Cervical cancer    Past Surgical History  Procedure Laterality Date  . Tubal ligation    . Cholecystectomy N/A 01/20/2013    Procedure: LAPAROSCOPIC CHOLECYSTECTOMY WITH INTRAOPERATIVE CHOLANGIOGRAM;  Surgeon: Shann Medal, MD;  Location: [facility];  Service: General;  Laterality: N/A;   Family History  Problem Relation Age of Onset  . Cancer Mother   . Colon cancer Mother   . Breast cancer Maternal Grandmother   . Breast cancer Paternal Grandmother    History  Substance Use Topics  . Smoking status: Never Smoker   . Smokeless tobacco: Never Used  . Alcohol Use: No   OB History   Grav Para Term Preterm Abortions TAB SAB Ect Mult Living   10 7 6 1      7      Review of Systems  Skin: Positive for rash.  Neurological: Positive for numbness (Bilateral feet).  All other systems reviewed and are negative.     Allergies  Benadryl and Onion  Home Medications   Prior to Admission  medications   Not on File   BP 91/63  Pulse 97  Temp(Src) 98 F (36.7 C)  Resp 22  Ht 5\' 4"  (1.626 m)  Wt 269 lb (122.018 kg)  BMI 46.15 kg/m2  SpO2 100%  LMP 09/15/2013 Physical Exam  Constitutional: She is oriented to person, place, and time. She appears well-developed and well-nourished. No distress.  HENT:  Head: Normocephalic and atraumatic.  Right Ear: Hearing normal.  Left Ear: Hearing normal.  Nose: Nose normal.  Mouth/Throat: Oropharynx is clear and moist and mucous membranes are normal.  Eyes: Conjunctivae and EOM are normal. Pupils are equal, round, and reactive to light.  Neck: Normal range of motion. Neck supple.  Cardiovascular: Regular rhythm, S1 normal and S2 normal.  Exam reveals no gallop and no friction rub.   No murmur heard. Pulmonary/Chest: Effort normal and breath sounds normal. No respiratory distress. She exhibits no tenderness.  Abdominal: Soft. Normal appearance and bowel sounds are normal. There is no hepatosplenomegaly. There is no tenderness. There is no rebound, no guarding, no tenderness at McBurney's point and negative Murphy's sign. No hernia.  Musculoskeletal: Normal range of motion.  Neurological: She is alert and oriented to person, place, and time. She has normal strength. No cranial nerve deficit or sensory deficit. Coordination normal. GCS eye subscore  is 4. GCS verbal subscore is 5. GCS motor subscore is 6.  Patient reports numbness, tingling and burning pain in both of her feet, but she has normal sensation, normal strength, no neurologic deficit  Skin: Skin is warm, dry and intact. No rash noted. No cyanosis.  Several well-circumscribed, deeply erythematous, circular raised lesions on the backs of both arms. She also has several in the right hip area.  Psychiatric: She has a normal mood and affect. Her speech is normal and behavior is normal. Thought content normal.    ED Course  Procedures (including critical care time) Labs  Review Labs Reviewed  CBC WITH DIFFERENTIAL  BASIC METABOLIC PANEL    Imaging Review No results found.   EKG Interpretation None      MDM   Final diagnoses:  None   insect bite, possible bedbug  peripheral neuropathy  Patient returns with a very itchy rash on the backs of her arms and her right hip area. These are suspicious for a this based on the amount of erythema and the clustering. She does report that she visited her uncle yesterday and sat on a chair, but she might have been bitten at that time. This does not look infectious. It does not require any specific treatment other than hydroxyzine for itch.  Patient very anxious upon arrival. She is complaining of bilateral foot pain, burning, tingling. Review of her records reveals that she has been seen for this in the past. She has no neurologic deficit. There is no low back pain to suggest this is radicular pain. Patient very anxious secondary to her rash and itching, possible pedal spasm resulting in bilateral, symmetric tingling, but peripheral neuropathy also possible.  She treated with IV fluids, morphine, Benadryl here in the ER. Will continue symptomatic treatment at home.    Orpah Greek, MD 09/15/13 [phone]

## 2013-09-15 NOTE — Discharge Instructions (Signed)
Bedbugs °Bedbugs are tiny bugs that live in and around beds. During the day, they hide in mattresses and other places near beds. They come out at night and bite people lying in bed. They need blood to live and grow. Bedbugs can be found in beds anywhere. Usually, they are found in places where many people come and go (hotels, shelters, hospitals). It does not matter whether the place is dirty or clean. °Getting bitten by bedbugs rarely causes a medical problem. The biggest problem can be getting rid of them.  This often takes the work of a pest control expert. °CAUSES °· Less use of pesticides. Bedbugs were common before the 1950s. Then, strong pesticides such as DDT nearly wiped them out. Today, these pesticides are not used because they harm the environment and can cause health problems. °· More travel. Besides mattresses, bedbugs can also live in clothing and luggage. They can come along as people travel from place to place. Bedbugs are more common in certain parts of the world. When people travel to those areas, the bugs can come home with them. °· Presence of birds and bats. Bedbugs often infest birds and bats. If you have these animals in or near your home, bedbugs may infest your house, too. °SYMPTOMS °It does not hurt to be bitten by a bedbug. You will probably not wake up when you are bitten. Bedbugs usually bite areas of the skin that are not covered. Symptoms may show when you wake up, or they may take a day or more to show up. Symptoms may include: °· Small red bumps on the skin. These might be lined up in a row or clustered in a group. °· A darker red dot in the middle of red bumps. °· Blisters on the skin. There may be swelling and very bad itching. These may be signs of an allergic reaction. This does not happen often. °DIAGNOSIS °Bedbug bites might look and feel like other types of insect bites. The bugs do not stay on the body like ticks or lice. They bite, drop off, and crawl away to hide. Your  caregiver will probably: °· Ask about your symptoms. °· Ask about your recent activities and travel. °· Check your skin for bedbug bites. °· Ask you to check at home for signs of bedbugs. You should look for: °¨ Spots or stains on the bed or nearby. This could be from bedbugs that were crushed or from their eggs or waste. °¨ Bedbugs themselves. They are reddish-brown, oval, and flat. They do not fly. They are about the size of an apple seed. °· Places to look for bedbugs include: °¨ Beds. Check mattresses, headboards, box springs, and bed frames. °¨ On drapes and curtains near the bed. °¨ Under carpeting in the bedroom. °¨ Behind electrical outlets. °¨ Behind any wallpaper that is peeling. °¨ Inside luggage. °TREATMENT °Most bedbug bites do not need treatment. They usually go away on their own in a few days. The bites are not dangerous. However, treatment may be needed if you have scratched so much that your skin has become infected. You may also need treatment if you are allergic to bedbug bites. Treatment options include: °· A drug that stops swelling and itching (corticosteroid). Usually, a cream is rubbed on the skin. If you have a bad rash, you may be given a corticosteroid pill. °· Oral antihistamines. These are pills to help control itching. °· Antibiotic medicines. An antibiotic may be prescribed for infected skin. °HOME CARE INSTRUCTIONS  °·   Take any medicine prescribed by your caregiver for your bites. Follow the directions carefully.  Consider wearing pajamas with long sleeves and pant legs.  Your bedroom may need to be treated. A pest control expert should make sure the bedbugs are gone. You may need to throw away mattresses or luggage. Ask the pest control expert what you can do to keep the bedbugs from coming back. Common suggestions include:  Putting a plastic cover over your mattress.  Washing and drying your clothes and bedding in hot water and a hot dryer. The temperature should be hotter  than 120 F (48.9 C). Bedbugs are killed by high temperatures.  Vacuuming carefully all around your bed. Vacuum in all cracks and crevices where the bugs might hide. Do this often.  Carefully checking all used furniture, bedding, or clothes that you bring into your house.  Eliminating bird nests and bat roosts.  If you get bedbug bites when traveling, check all your possessions carefully before bringing them into your house. If you find any bugs on clothes or in your luggage, consider throwing those items away. SEEK MEDICAL CARE IF:  You have red bug bites that keep coming back.  You have red bug bites that itch badly.  You have bug bites that cause a skin rash.  You have scratch marks that are red and sore. SEEK IMMEDIATE MEDICAL CARE IF: You have a fever. Document Released: 02/22/2010 Document Revised: 04/14/2011 Document Reviewed: 02/22/2010 [facility] Patient Information 2015 [place], [place]. This information is not intended to replace advice given to you by your health care provider. Make sure you discuss any questions you have with your health care provider.  Neuropathic Pain We often think that pain has a physical cause. If we get rid of the cause, the pain should go away. Nerves themselves can also cause pain. It is called neuropathic pain, which means nerve abnormality. It may be difficult for the patients who have it and for the treating caregivers. Pain is usually described as acute (short-lived) or chronic (long-lasting). Acute pain is related to the physical sensations caused by an injury. It can last from a few seconds to many weeks, but it usually goes away when normal healing occurs. Chronic pain lasts beyond the typical healing time. With neuropathic pain, the nerve fibers themselves may be damaged or injured. They then send incorrect signals to other pain centers. The pain you feel is real, but the cause is not easy to find.  CAUSES  Chronic pain can result from diseases,  such as diabetes and shingles (an infection related to chickenpox), or from trauma, surgery, or amputation. It can also happen without any known injury or disease. The nerves are sending pain messages, even though there is no identifiable cause for such messages.   Other common causes of neuropathy include diabetes, phantom limb pain, or Regional Pain Syndrome (RPS).  As with all forms of chronic back pain, if neuropathy is not correctly treated, there can be a number of associated problems that lead to a downward cycle for the patient. These include depression, sleeplessness, feelings of fear and anxiety, limited social interaction and inability to do normal daily activities or work.  The most dramatic and mysterious example of neuropathic pain is called "phantom limb syndrome." This occurs when an arm or a leg has been removed because of illness or injury. The brain still gets pain messages from the nerves that originally carried impulses from the missing limb. These nerves now seem to misfire and  cause troubling pain.  Neuropathic pain often seems to have no cause. It responds poorly to standard pain treatment. Neuropathic pain can occur after:  Shingles (herpes zoster virus infection).  A lasting burning sensation of the skin, caused usually by injury to a peripheral nerve.  Peripheral neuropathy which is widespread nerve damage, often caused by diabetes or alcoholism.  Phantom limb pain following an amputation.  Facial nerve problems (trigeminal neuralgia).  Multiple sclerosis.  Reflex sympathetic dystrophy.  Pain which comes with cancer and cancer chemotherapy.  Entrapment neuropathy such as when pressure is put on a nerve such as in carpal tunnel syndrome.  Back, leg, and hip problems (sciatica).  Spine or back surgery.  HIV Infection or AIDS where nerves are infected by viruses. Your caregiver can explain items in the above list which may apply to you. SYMPTOMS    Characteristics of neuropathic pain are:  Severe, sharp, electric shock-like, shooting, lightening-like, knife-like.  Pins and needles sensation.  Deep burning, deep cold, or deep ache.  Persistent numbness, tingling, or weakness.  Pain resulting from light touch or other stimulus that would not usually cause pain.  Increased sensitivity to something that would normally cause pain, such as a pinprick. Pain may persist for months or years following the healing of damaged tissues. When this happens, pain signals no longer sound an alarm about current injuries or injuries about to happen. Instead, the alarm system itself is not working correctly.  Neuropathic pain may get worse instead of better over time. For some people, it can lead to serious disability. It is important to be aware that severe injury in a limb can occur without a proper, protective pain response.Burns, cuts, and other injuries may go unnoticed. Without proper treatment, these injuries can become infected or lead to further disability. Take any injury seriously, and consult your caregiver for treatment. DIAGNOSIS  When you have a pain with no known cause, your caregiver will probably ask some specific questions:   Do you have any other conditions, such as diabetes, shingles, multiple sclerosis, or HIV infection?  How would you describe your pain? (Neuropathic pain is often described as shooting, stabbing, burning, or searing.)  Is your pain worse at any time of the day? (Neuropathic pain is usually worse at night.)  Does the pain seem to follow a certain physical pathway?  Does the pain come from an area that has missing or injured nerves? (An example would be phantom limb pain.)  Is the pain triggered by minor things such as rubbing against the sheets at night? These questions often help define the type of pain involved. Once your caregiver knows what is happening, treatment can begin. Anticonvulsant, antidepressant  drugs, and various pain relievers seem to work in some cases. If another condition, such as diabetes is involved, better management of that disorder may relieve the neuropathic pain.  TREATMENT  Neuropathic pain is frequently long-lasting and tends not to respond to treatment with narcotic type pain medication. It may respond well to other drugs such as antiseizure and antidepressant medications. Usually, neuropathic problems do not completely go away, but partial improvement is often possible with proper treatment. Your caregivers have large numbers of medications available to treat you. Do not be discouraged if you do not get immediate relief. Sometimes different medications or a combination of medications will be tried before you receive the results you are hoping for. See your caregiver if you have pain that seems to be coming from nowhere and does not  go away. Help is available.  SEEK IMMEDIATE MEDICAL CARE IF:   There is a sudden change in the quality of your pain, especially if the change is on only one side of the body.  You notice changes of the skin, such as redness, black or purple discoloration, swelling, or an ulcer.  You cannot move the affected limbs. Document Released: 10/18/2003 Document Revised: 04/14/2011 Document Reviewed: 10/18/2003 [facility] Patient Information 2015 [place], [place]. This information is not intended to replace advice given to you by your health care provider. Make sure you discuss any questions you have with your health care provider.   Emergency Department Resource Guide 1) Find a Doctor and Pay Out of Pocket Although you won't have to find out who is covered by your insurance plan, it is a good idea to ask around and get recommendations. You will then need to call the office and see if the doctor you have chosen will accept you as a new patient and what types of options they offer for patients who are self-pay. Some doctors offer discounts or will set up payment  plans for their patients who do not have insurance, but you will need to ask so you aren't surprised when you get to your appointment.  2) Contact Your Local Health Department Not all health departments have doctors that can see patients for sick visits, but many do, so it is worth a call to see if yours does. If you don't know where your local health department is, you can check in your phone book. The CDC also has a tool to help you locate your state's health department, and many state websites also have listings of all of their local health departments.  3) Find a [facility] If your illness is not likely to be very severe or complicated, you may want to try a walk in clinic. These are popping up all over the country in pharmacies, drugstores, and shopping centers. They're usually staffed by nurse practitioners or physician assistants that have been trained to treat common illnesses and complaints. They're usually fairly quick and inexpensive. However, if you have serious medical issues or chronic medical problems, these are probably not your best option.  No Primary Care Doctor: - Call Health Connect at  [phone] - they can help you locate a primary care doctor that  accepts your insurance, provides certain services, etc. - Physician Referral Service- [phone]  Chronic Pain Problems: Organization         Address  Phone   Notes  [facility]  [phone] Patients need to be referred by their primary care doctor.   Medication Assistance: Organization         Address  Phone   Notes  [facility] Medication [facility] [address] [place], [place] [address] [phone] --Must be a resident of [facility] -- Must have NO insurance coverage whatsoever (no Medicaid/ Medicare, etc.) -- The pt. MUST have a primary care doctor that directs their care regularly and follows them in the community   [facility]ist  [phone]   [employer]   [phone]    Agencies that provide inexpensive medical care: Organization         Address  Phone   Notes  [place]  [phone]   [facility]    [phone]   [facility] [place], [place] [address] [phone]  [facility] [address] [place] [phone]   Planned Parenthood    [phone]   [facility]    [phone]   [facility] [facility] [address], [place] Phone:  [phone], Fax:  [phone] Hours of Operation:  9 am - 6 pm, M-F.  Also accepts Medicaid/Medicare and self-pay.  [facility] [place], [address], [place] Phone: [phone], Fax: [phone]. Hours of Operation:  8:30 am - 5:30 pm, M-F.  Also accepts Medicaid and self-pay.  [facility] [place] [address] [place] Phone: [phone]   [place], [place], [place] [phone], Ext. [phone] Mondays & Thursdays: 7-9 AM.  First 15 patients are seen on a first come, first serve basis.    [place] Providers:  Organization         Address  Phone   Notes  [facility] [address], [place] [phone] Also accepts self-pay patients.  [facility] [address], [place]  [phone]   [place], [address], [place] [phone]   [facility] [address], [place] [phone]   Lucianne Lei [address], [place]   [phone] Only accepts [place] Access [place] patients after they have their name applied to their card.   Self-Pay (no insurance) in [facility]:  Organization         Address  Phone   Notes  Sickle Cell Patients, [facility] [place] [phone]   [facility] [place] [phone]   [facility] [place]  [place], [place], [place] [phone]   [facility]/Dr. Osei-Bonsu  [address], [place] or [address], [place] [phone] Phone number for both [employer] and [place] locations is the same.  [facility] [address], [place] [phone]   [facility] [address], [place] or [address] [phone]   [facility] [address], [place] [phone], phone; [phone], fax Sees patients 1st and 3rd Saturday of every month.  Must not qualify for public or private insurance (i.e. Medicaid, Medicare, [place] Health Choice, Veterans' Benefits)  Household income should be no more than 200% of the poverty level The clinic cannot treat you if you are pregnant or think you are pregnant  Sexually transmitted diseases are not treated at the clinic.    Dental Care: Organization         Address  Phone  Notes  [facility] Department of [facility] [place] [phone] Accepts children up to age 5 who are enrolled in [place] or [place]; pregnant women with a Medicaid card; and children who have applied for Medicaid or [place] Health Choice, but were declined, whose parents can pay a reduced fee at time of service.  [facility] Department of [facility]  [address], [place] [phone] Accepts children up to age 109 who are enrolled in [place] or [place]; pregnant women with a Medicaid card; and children  who have applied for Medicaid or [place] Health Choice, but were declined, whose parents can pay a reduced fee at time of service.  [facility] Access PRO[facility]  [place] [phone] Patients are  seen by appointment only. Walk-ins are not accepted. [place] will see patients 20 years of age and older. Monday - Tuesday (8am-5pm) Most Wednesdays (8:30-5pm) $30 per visit, cash only  [facility] Adult Dental Access PROGRAM  [address], [facility] [phone] Patients are seen by appointment only. Walk-ins are not accepted. [place] will see patients 8 years of age and older. One Wednesday Evening (Monthly: Volunteer Based).  $30 per visit, cash only  [place]  [phone] for adults; Children under age 46, call Graduate Pediatric Dentistry at [phone]. Children aged 80-14, please call [phone] to request a pediatric application.  Dental services are provided in all areas of dental care including fillings, crowns and bridges, complete and partial dentures, implants, gum treatment, root canals, and extractions. Preventive care is also provided. Treatment is provided to both adults and children. Patients are selected via a lottery and there is often a waiting list.   [facility] [address] [place]  [phone] [email or web address]   [facility] [address] [place], [place] [phone], Ext. [phone] Second and Fourth Thursday of each month, opens at 6:30 AM; Clinic ends at 9 AM.  Patients are seen on a first-come first-served basis, and a limited number are seen during each clinic.   [facility]  [address] Hillard Danker [place], [place] [phone]   Eligibility Requirements You must have lived in [place], [place], or [place] counties for at least the last three months.   You cannot be eligible for state or federal sponsored [employer], including [employer], [place], or [employer].   You generally cannot be eligible for healthcare insurance through your employer.    How to apply: Eligibility screenings are held every Tuesday and Wednesday afternoon from 1:00 pm until 4:00  pm. You do not need an appointment for the interview!  [facility] [address] [place], [place]   [place]  [place] Department  [place]  [phone]    Behavioral Health Resources in the Community: Intensive Outpatient Programs Organization         Address  Phone  Notes  [place]. [address], [place], [place] [phone]   [facility] Outpatient [address], [place], Center   ADS: Alcohol & Drug Svcs [address], [place], [place]   Ali [place] [address],  [place], [place] or [phone]   Substance Abuse Resources Organization         Address  Phone  Notes  Alcohol and Drug Services  [phone]   [place]  [phone]   [facility] [place]   Chinita Pester  [phone]   Residential & Outpatient Substance Abuse Program  [phone]   Psychological Services Organization         Address  Phone  Notes  [facility] [place]  [place]ila [place]  [phone]   [place] [address] [place] or [phone]    Mobile Crisis Teams Organization  Address  Phone  Notes  [facility], Mobile Crisis Care Unit  [phone]   [facility]  [address] [place], [place]   [facility] [address] [place] [phone]    Self-Help/Support Groups Organization         Address  Phone             Notes  [place]. of [place] - variety of support groups  [place] Call for more information  Narcotics Anonymous (NA), [facility] [address], [employer] [place]  2 meetings at this location   Special educational needs teacher          Address  Phone  Notes  [facility]idential Treatment [place],    [place]  [phone]   [facility]  [address] [place] [address], [place] [phone], [place], [place]   [place], [place] [phone] Admissions: 8am-3pm M-F  Incentives Substance [place] [address],    [place], [place] [phone]   [facility] [address] [place], [place], [place]   [facility] [address],  [place], [place]   [facility] - Intensive Outpatient [address] Kristeen [address], [place], [place]   [facility] ([place].) [place].,  [place], [place] [phone] or [phone]   [facility] ([facility]) [address], [place], [place] Accepts Medicaid  [facility] [place] [address]  [place] [phone] Substance Abuse/Addiction Treatment   [facility] Organization         Address  Phone  Notes  [facility]  [phone]   Domenic Schwab, PhD [address] Arlis Porta [place], [place]   [phone] or [phone]   [place] Jane [place], [place] [phone]   [facility] [address] [place], [place] [phone] Insurance/Medicaid/sponsorship through [facility] and Families [address], [address] [place]                                    [place], [place] [phone] [place] [address], [place] [phone]    Dr. Adele Schilder  [phone]   [facility] [place] Dept. 1) [address], [place] 2) [place] 3)  [place] [address], [place] [phone]  [phone]   [place] [phone] or [phone] (After Hours)

## 2013-10-06 ENCOUNTER — Emergency Department (HOSPITAL_COMMUNITY)
Admission: EM | Admit: 2013-10-06 | Discharge: 2013-10-06 | Disposition: A | Discharge status: Home / Self Care | Payer: Self-pay | Attending: Emergency Medicine | Admitting: Emergency Medicine

## 2013-10-06 ENCOUNTER — Encounter (HOSPITAL_COMMUNITY): Payer: Self-pay | Admitting: Emergency Medicine

## 2013-10-06 DIAGNOSIS — G629 Polyneuropathy, unspecified: Secondary | ICD-10-CM

## 2013-10-06 DIAGNOSIS — G609 Hereditary and idiopathic neuropathy, unspecified: Secondary | ICD-10-CM | POA: Insufficient documentation

## 2013-10-06 DIAGNOSIS — Z8541 Personal history of malignant neoplasm of cervix uteri: Secondary | ICD-10-CM | POA: Insufficient documentation

## 2013-10-06 DIAGNOSIS — M79609 Pain in unspecified limb: Secondary | ICD-10-CM | POA: Insufficient documentation

## 2013-10-06 DIAGNOSIS — IMO0002 Reserved for concepts with insufficient information to code with codable children: Secondary | ICD-10-CM | POA: Insufficient documentation

## 2013-10-06 DIAGNOSIS — Z8679 Personal history of other diseases of the circulatory system: Secondary | ICD-10-CM | POA: Insufficient documentation

## 2013-10-06 MED ORDER — LORAZEPAM 1 MG PO TABS
1.0000 mg | ORAL_TABLET | Freq: Once | ORAL | Status: AC
  Administered 2013-10-06: 1 mg via ORAL
  Filled 2013-10-06: qty 1

## 2013-10-06 MED ORDER — OXYCODONE-ACETAMINOPHEN 5-325 MG PO TABS: 1.0000 | ORAL_TABLET | ORAL | Status: DC | PRN

## 2013-10-06 MED ORDER — HYDROCODONE-ACETAMINOPHEN 5-325 MG PO TABS
1.0000 | ORAL_TABLET | Freq: Once | ORAL | Status: AC
  Administered 2013-10-06: 1 via ORAL
  Filled 2013-10-06: qty 1

## 2013-10-06 MED ORDER — PREDNISONE 20 MG PO TABS: ORAL_TABLET | ORAL | Status: DC

## 2013-10-06 MED ORDER — KETOROLAC TROMETHAMINE 60 MG/2ML IM SOLN
60.0000 mg | Freq: Once | INTRAMUSCULAR | Status: AC
  Administered 2013-10-06: 60 mg via INTRAMUSCULAR
  Filled 2013-10-06: qty 2

## 2013-10-06 NOTE — ED Notes (Signed)
C/o bil feet " Burning" since 3 hrs pta. Denies any injury.

## 2013-10-06 NOTE — Discharge Instructions (Signed)

## 2013-10-06 NOTE — ED Notes (Signed)
Unable to get d/c form to pull up for pt to sign for d/c instructions

## 2013-10-07 NOTE — ED Provider Notes (Signed)
CSN: [id number]     Arrival date & time 10/06/13  1501 History   First MD Initiated Contact with Patient 10/06/13 1503     Chief Complaint  Patient presents with  . Foot Pain   Kristi Martin is a 43 y.o. female who presents to the Emergency Department complaining of bilateral foot pain and burning sensation.  She states this is a recurrent problem and she developed symptoms three hours PTA.  She describes an intense, burning and "twitching" to her feet.  She denies swelling, numbness, redness or recent injury.  She has not tried any medications prior to ed arrival.      (Consider location/radiation/quality/duration/timing/severity/associated sxs/prior Treatment) Patient is a 43 y.o. female presenting with lower extremity pain.  Foot Pain This is a recurrent problem. Associated symptoms include arthralgias. Pertinent negatives include no abdominal pain, chills, fever, joint swelling, nausea, numbness, rash, vomiting or weakness.    Past Medical History  Diagnosis Date  . Left knee pain   . Migraine   . Cancer     cervical cx at age 28  . Cervical cancer    Past Surgical History  Procedure Laterality Date  . Tubal ligation    . Cholecystectomy N/A 01/20/2013    Procedure: LAPAROSCOPIC CHOLECYSTECTOMY WITH INTRAOPERATIVE CHOLANGIOGRAM;  Surgeon: Shann Medal, MD;  Location: [facility];  Service: General;  Laterality: N/A;   Family History  Problem Relation Age of Onset  . Cancer Mother   . Colon cancer Mother   . Breast cancer Maternal Grandmother   . Breast cancer Paternal Grandmother    History  Substance Use Topics  . Smoking status: Never Smoker   . Smokeless tobacco: Never Used  . Alcohol Use: No   OB History   Grav Para Term Preterm Abortions TAB SAB Ect Mult Living   10 7 6 1      7      Review of Systems  Constitutional: Negative for fever and chills.  Respiratory: Negative for shortness of breath.   Gastrointestinal: Negative for nausea, vomiting and abdominal  pain.  Genitourinary: Negative for dysuria and difficulty urinating.  Musculoskeletal: Positive for arthralgias. Negative for back pain and joint swelling.  Skin: Negative for color change, rash and wound.  Neurological: Negative for dizziness, weakness and numbness.  All other systems reviewed and are negative.     Allergies  Benadryl and Onion  Home Medications   Prior to Admission medications   Medication Sig Start Date End Date Taking? Authorizing Provider  hydrOXYzine (ATARAX/VISTARIL) 25 MG tablet Take 1-2 tablets (25-50 mg total) by mouth every 6 (six) hours. 09/15/13  Yes Orpah Greek, MD  oxyCODONE-acetaminophen (PERCOCET/ROXICET) 5-325 MG per tablet Take 1 tablet by mouth every 4 (four) hours as needed. 10/06/13   Antara Brecheisen L. Aleyssa Pike, PA-C  predniSONE (DELTASONE) 20 MG tablet Take 3 tablets po qd x 2 days, then 2 tablets po qd x 2 days, then 1 tablet po qd x 2 days 10/06/13   Mckenzie Toruno L. Tonyia Marschall, PA-C   BP 131/52  Pulse 95  Temp(Src) 98.1 F (36.7 C) (Oral)  Resp 28  Ht 5\' 5"  (1.651 m)  Wt 261 lb (118.389 kg)  BMI 43.43 kg/m2  SpO2 96%  LMP 09/15/2013 Physical Exam  Nursing note and vitals reviewed. Constitutional: She is oriented to person, place, and time. She appears well-developed and well-nourished. No distress.  HENT:  Head: Normocephalic and atraumatic.  Cardiovascular: Normal rate, regular rhythm, normal heart sounds and intact distal  pulses.   Pulmonary/Chest: Effort normal and breath sounds normal. No respiratory distress.  Musculoskeletal: She exhibits tenderness. She exhibits no edema.  Diffuse ttp of the bilateral dorsal feet and anterior LE's.   ROM is preserved.  DP pulse is brisk,distal sensation intact.  No erythema, abrasion, bruising or bony deformity.  No proximal tenderness. No edema of the lower legs.    Neurological: She is alert and oriented to person, place, and time. She exhibits normal muscle tone. Coordination normal.  Skin: Skin is warm  and dry.    ED Course  Procedures (including critical care time) Labs Review Labs Reviewed - No data to display  Imaging Review No results found.   EKG Interpretation None      MDM   Final diagnoses:  Peripheral neuropathy    Pt is well appearing, anxious, no focal neuro deficits.  No clinical suspicion for DVT.  Pt seen here previously for same.  Has not arranged f/u with neurology or PMD.  NV intact on exam.  i have given referral info and advised pt to try to establish PMD.  She appears stable for d/c    Gavin Telford L. Vanessa [place], PA-C 10/07/13 2127

## 2013-10-10 NOTE — ED Provider Notes (Signed)
Medical screening examination/treatment/procedure(s) were performed by non-physician practitioner and as supervising physician I was immediately available for consultation/collaboration.   EKG Interpretation None        Maudry Diego, MD 10/10/13 1347

## 2013-12-05 ENCOUNTER — Encounter (HOSPITAL_COMMUNITY): Payer: Self-pay | Admitting: Emergency Medicine

## 2013-12-07 ENCOUNTER — Emergency Department (HOSPITAL_COMMUNITY): Payer: Self-pay

## 2013-12-07 ENCOUNTER — Emergency Department (HOSPITAL_COMMUNITY)
Admission: EM | Admit: 2013-12-07 | Discharge: 2013-12-07 | Disposition: A | Discharge status: Home / Self Care | Payer: Self-pay | Attending: Emergency Medicine | Admitting: Emergency Medicine

## 2013-12-07 DIAGNOSIS — Y9389 Activity, other specified: Secondary | ICD-10-CM | POA: Insufficient documentation

## 2013-12-07 DIAGNOSIS — R0602 Shortness of breath: Secondary | ICD-10-CM | POA: Insufficient documentation

## 2013-12-07 DIAGNOSIS — W19XXXA Unspecified fall, initial encounter: Secondary | ICD-10-CM

## 2013-12-07 DIAGNOSIS — S0083XA Contusion of other part of head, initial encounter: Secondary | ICD-10-CM | POA: Insufficient documentation

## 2013-12-07 DIAGNOSIS — G44319 Acute post-traumatic headache, not intractable: Secondary | ICD-10-CM

## 2013-12-07 DIAGNOSIS — Y9289 Other specified places as the place of occurrence of the external cause: Secondary | ICD-10-CM | POA: Insufficient documentation

## 2013-12-07 DIAGNOSIS — S20212A Contusion of left front wall of thorax, initial encounter: Secondary | ICD-10-CM | POA: Insufficient documentation

## 2013-12-07 DIAGNOSIS — S3991XA Unspecified injury of abdomen, initial encounter: Secondary | ICD-10-CM | POA: Insufficient documentation

## 2013-12-07 DIAGNOSIS — Z8679 Personal history of other diseases of the circulatory system: Secondary | ICD-10-CM | POA: Insufficient documentation

## 2013-12-07 DIAGNOSIS — F419 Anxiety disorder, unspecified: Secondary | ICD-10-CM | POA: Insufficient documentation

## 2013-12-07 DIAGNOSIS — Z8541 Personal history of malignant neoplasm of cervix uteri: Secondary | ICD-10-CM | POA: Insufficient documentation

## 2013-12-07 DIAGNOSIS — W01198A Fall on same level from slipping, tripping and stumbling with subsequent striking against other object, initial encounter: Secondary | ICD-10-CM | POA: Insufficient documentation

## 2013-12-07 LAB — CBC WITH DIFFERENTIAL/PLATELET
Basophils Absolute: 0 10*3/uL (ref 0.0–0.1)
Basophils Relative: 1 % (ref 0–1)
EOS ABS: 0.1 10*3/uL (ref 0.0–0.7)
EOS PCT: 1 % (ref 0–5)
HCT: 33.7 % — ABNORMAL LOW (ref 36.0–46.0)
HEMOGLOBIN: 10.7 g/dL — AB (ref 12.0–15.0)
LYMPHS ABS: 2 10*3/uL (ref 0.7–4.0)
Lymphocytes Relative: 40 % (ref 12–46)
MCH: 26.4 pg (ref 26.0–34.0)
MCHC: 31.8 g/dL (ref 30.0–36.0)
MCV: 83.2 fL (ref 78.0–100.0)
MONOS PCT: 10 % (ref 3–12)
Monocytes Absolute: 0.5 10*3/uL (ref 0.1–1.0)
NEUTROS PCT: 48 % (ref 43–77)
Neutro Abs: 2.4 10*3/uL (ref 1.7–7.7)
Platelets: 314 10*3/uL (ref 150–400)
RBC: 4.05 MIL/uL (ref 3.87–5.11)
RDW: 14.7 % (ref 11.5–15.5)
WBC: 4.9 10*3/uL (ref 4.0–10.5)

## 2013-12-07 MED ORDER — HYDROMORPHONE HCL 1 MG/ML IJ SOLN
1.0000 mg | Freq: Once | INTRAMUSCULAR | Status: AC
  Administered 2013-12-07: 1 mg via INTRAVENOUS
  Filled 2013-12-07: qty 1

## 2013-12-07 MED ORDER — IOHEXOL 300 MG/ML  SOLN
100.0000 mL | Freq: Once | INTRAMUSCULAR | Status: AC | PRN
  Administered 2013-12-07: 100 mL via INTRAVENOUS

## 2013-12-07 MED ORDER — ONDANSETRON HCL 4 MG/2ML IJ SOLN
4.0000 mg | Freq: Once | INTRAMUSCULAR | Status: AC
  Administered 2013-12-07: 4 mg via INTRAVENOUS
  Filled 2013-12-07: qty 2

## 2013-12-07 MED ORDER — OXYCODONE-ACETAMINOPHEN 5-325 MG PO TABS: 1.0000 | ORAL_TABLET | Freq: Four times a day (QID) | ORAL | Status: DC | PRN

## 2013-12-07 MED ORDER — KETOROLAC TROMETHAMINE 30 MG/ML IJ SOLN
30.0000 mg | Freq: Once | INTRAMUSCULAR | Status: AC
  Administered 2013-12-07: 30 mg via INTRAVENOUS
  Filled 2013-12-07: qty 1

## 2013-12-07 NOTE — ED Provider Notes (Signed)
CSN: [id number]     Arrival date & time 12/07/13  1102 History  This chart was scribed for [employer]. Alvino Chapel, MD by Zola Button, ED Scribe. This patient was seen in room APA01/APA01 and the patient's care was started at 11:15 AM.   Chief Complaint  Patient presents with  . Fall   HPI HPI Comments: Kristi Martin is a 43 y.o. female who presents to the Emergency Department complaining of a fall that occurred yesterday around 6:30pm. Patient reports slipping in mud and hitting her head and left side on porch. She reports having severe head pain and worsening, stabbing pain on her left side. Patient did experience LOC. She also reports having dizziness and SOB. Patient states she is healthy otherwise, and she is not on any blood thinners. She has allergies to Benadryl.  Past Medical History  Diagnosis Date  . Left knee pain   . Migraine   . Cancer     cervical cx at age 35  . Cervical cancer    Past Surgical History  Procedure Laterality Date  . Tubal ligation    . Cholecystectomy N/A 01/20/2013    Procedure: LAPAROSCOPIC CHOLECYSTECTOMY WITH INTRAOPERATIVE CHOLANGIOGRAM;  Surgeon: Shann Medal, MD;  Location: [facility];  Service: General;  Laterality: N/A;   Family History  Problem Relation Age of Onset  . Cancer Mother   . Colon cancer Mother   . Breast cancer Maternal Grandmother   . Breast cancer Paternal Grandmother    History  Substance Use Topics  . Smoking status: Never Smoker   . Smokeless tobacco: Never Used  . Alcohol Use: No   OB History    Gravida Para Term Preterm AB TAB SAB Ectopic Multiple Living   10 7 6 1      7      Review of Systems  Constitutional: Negative for fever.  HENT: Negative for ear discharge.   Eyes: Negative for discharge.  Respiratory: Positive for shortness of breath.   Cardiovascular: Positive for chest pain.  Gastrointestinal: Negative for diarrhea.  Genitourinary: Negative for hematuria.  Skin: Negative for rash.  Neurological:  Positive for dizziness and headaches.  Psychiatric/Behavioral: The patient is nervous/anxious.       Allergies  Benadryl and Onion  Home Medications   Prior to Admission medications   Medication Sig Start Date End Date Taking? Authorizing Provider  hydrOXYzine (ATARAX/VISTARIL) 25 MG tablet Take 1-2 tablets (25-50 mg total) by mouth every 6 (six) hours. Patient not taking: Reported on 12/07/2013 09/15/13   Orpah Greek, MD  oxyCODONE-acetaminophen (PERCOCET/ROXICET) 5-325 MG per tablet Take 1-2 tablets by mouth every 6 (six) hours as needed. 12/07/13   Jasper Riling. Alvino Chapel, MD  predniSONE (DELTASONE) 20 MG tablet Take 3 tablets po qd x 2 days, then 2 tablets po qd x 2 days, then 1 tablet po qd x 2 days Patient not taking: Reported on 12/07/2013 10/06/13   Tammy L. Triplett, PA-C   BP 91/67 mmHg  Pulse 62  Temp(Src) 97.7 F (36.5 C) (Oral)  Resp 29  Ht 5\' 5"  (1.651 m)  Wt 260 lb (117.935 kg)  BMI 43.27 kg/m2  SpO2 97% Physical Exam  Constitutional: She is oriented to person, place, and time. She appears well-developed and well-nourished. No distress.  Patient is anxious.  HENT:  Head: Normocephalic.  Mouth/Throat: Oropharynx is clear and moist. No oropharyngeal exudate.  Eyes: Pupils are equal, round, and reactive to light.  Neck: Normal range of motion. Neck supple.  Cardiovascular: Normal rate.   Pulmonary/Chest: Effort normal. She exhibits tenderness.  Moderate to severe chest tenderness to posterior chest. LUQ. No crepitus.  Abdominal: There is tenderness.  LUQ tenderness. No crepitus.  Neurological: She is alert and oriented to person, place, and time. No cranial nerve deficit.  Skin: Skin is warm and dry. No rash noted.  2 cm hematoma to occipital lobe, no step-off or deformity. No ecchymoses. No subcutaneous emphysema.  Psychiatric: She has a normal mood and affect. Her behavior is normal.  Nursing note and vitals reviewed.   ED Course  Procedures   DIAGNOSTIC STUDIES: Oxygen Saturation is 96% on room air, adequate by my interpretation.    COORDINATION OF CARE: 11:18 AM-Discussed treatment plan which includes pain medication, CXR and labs with pt at bedside and pt agreed to plan.   Labs Review Labs Reviewed  CBC WITH DIFFERENTIAL - Abnormal; Notable for the following:    Hemoglobin 10.7 (*)    HCT 33.7 (*)    All other components within normal limits  I-STAT CHEM 8, ED    Imaging Review No results found.   EKG Interpretation None      MDM   Final diagnoses:  Fall  Acute post-traumatic headache, not intractable  Chest wall contusion, left, initial encounter    Patient with fall. Headache and chest contusion. Negative imaging.Will discharge home  I personally performed the services described in this documentation, which was scribed in my presence. The recorded information has been reviewed and is accurate.    Jasper Riling. Alvino Chapel, MD 12/09/13 [phone]

## 2013-12-07 NOTE — ED Notes (Addendum)
Patient fell last night on concrete at 1830; patient reports hitting head, unsure of LOC; crepitus on Left chest per EMS; patients reports headache and difficulty taking breaths.

## 2013-12-07 NOTE — Discharge Instructions (Signed)

## 2014-01-17 ENCOUNTER — Encounter (HOSPITAL_COMMUNITY): Payer: Self-pay | Admitting: Emergency Medicine

## 2014-01-17 ENCOUNTER — Emergency Department (HOSPITAL_COMMUNITY): Payer: Self-pay

## 2014-01-17 ENCOUNTER — Emergency Department (HOSPITAL_COMMUNITY)
Admission: EM | Admit: 2014-01-17 | Discharge: 2014-01-17 | Disposition: A | Discharge status: Home / Self Care | Payer: Self-pay | Attending: Emergency Medicine | Admitting: Emergency Medicine

## 2014-01-17 DIAGNOSIS — N200 Calculus of kidney: Secondary | ICD-10-CM | POA: Insufficient documentation

## 2014-01-17 DIAGNOSIS — Z8679 Personal history of other diseases of the circulatory system: Secondary | ICD-10-CM | POA: Insufficient documentation

## 2014-01-17 DIAGNOSIS — M792 Neuralgia and neuritis, unspecified: Secondary | ICD-10-CM | POA: Insufficient documentation

## 2014-01-17 DIAGNOSIS — N12 Tubulo-interstitial nephritis, not specified as acute or chronic: Secondary | ICD-10-CM

## 2014-01-17 DIAGNOSIS — Z8541 Personal history of malignant neoplasm of cervix uteri: Secondary | ICD-10-CM | POA: Insufficient documentation

## 2014-01-17 DIAGNOSIS — G5793 Unspecified mononeuropathy of bilateral lower limbs: Secondary | ICD-10-CM

## 2014-01-17 DIAGNOSIS — Z3202 Encounter for pregnancy test, result negative: Secondary | ICD-10-CM | POA: Insufficient documentation

## 2014-01-17 LAB — CBC WITH DIFFERENTIAL/PLATELET
Basophils Absolute: 0 10*3/uL (ref 0.0–0.1)
Basophils Relative: 1 % (ref 0–1)
Eosinophils Absolute: 0.1 10*3/uL (ref 0.0–0.7)
Eosinophils Relative: 1 % (ref 0–5)
HCT: 37.5 % (ref 36.0–46.0)
Hemoglobin: 11.8 g/dL — ABNORMAL LOW (ref 12.0–15.0)
LYMPHS PCT: 33 % (ref 12–46)
Lymphs Abs: 1.8 10*3/uL (ref 0.7–4.0)
MCH: 26.6 pg (ref 26.0–34.0)
MCHC: 31.5 g/dL (ref 30.0–36.0)
MCV: 84.5 fL (ref 78.0–100.0)
Monocytes Absolute: 0.4 10*3/uL (ref 0.1–1.0)
Monocytes Relative: 7 % (ref 3–12)
NEUTROS PCT: 58 % (ref 43–77)
Neutro Abs: 3.2 10*3/uL (ref 1.7–7.7)
PLATELETS: 325 10*3/uL (ref 150–400)
RBC: 4.44 MIL/uL (ref 3.87–5.11)
RDW: 15.9 % — ABNORMAL HIGH (ref 11.5–15.5)
WBC: 5.6 10*3/uL (ref 4.0–10.5)

## 2014-01-17 LAB — COMPREHENSIVE METABOLIC PANEL
ALT: 27 U/L (ref 0–35)
AST: 21 U/L (ref 0–37)
Albumin: 2.9 g/dL — ABNORMAL LOW (ref 3.5–5.2)
Alkaline Phosphatase: 73 U/L (ref 39–117)
Anion gap: 12 (ref 5–15)
BUN: 9 mg/dL (ref 6–23)
CO2: 22 meq/L (ref 19–32)
Calcium: 8.6 mg/dL (ref 8.4–10.5)
Chloride: 105 mEq/L (ref 96–112)
Creatinine, Ser: 1.18 mg/dL — ABNORMAL HIGH (ref 0.50–1.10)
GFR, EST AFRICAN AMERICAN: 64 mL/min — AB (ref >90)
GFR, EST NON AFRICAN AMERICAN: 55 mL/min — AB (ref >90)
GLUCOSE: 93 mg/dL (ref 70–99)
POTASSIUM: 3.8 meq/L (ref 3.7–5.3)
SODIUM: 139 meq/L (ref 137–147)
Total Bilirubin: <0.2 mg/dL — ABNORMAL LOW (ref 0.3–1.2)
Total Protein: 6.6 g/dL (ref 6.0–8.3)

## 2014-01-17 LAB — URINALYSIS, ROUTINE W REFLEX MICROSCOPIC
Bilirubin Urine: NEGATIVE
Glucose, UA: NEGATIVE mg/dL
Hgb urine dipstick: NEGATIVE
Ketones, ur: NEGATIVE mg/dL
Nitrite: POSITIVE — AB
PH: 5.5 (ref 5.0–8.0)
Protein, ur: NEGATIVE mg/dL
SPECIFIC GRAVITY, URINE: 1.021 (ref 1.005–1.030)
UROBILINOGEN UA: 0.2 mg/dL (ref 0.0–1.0)

## 2014-01-17 LAB — I-STAT TROPONIN, ED: Troponin i, poc: 0.01 ng/mL (ref 0.00–0.08)

## 2014-01-17 LAB — TROPONIN I: Troponin I: <0.3 ng/mL (ref <0.30)

## 2014-01-17 LAB — URINE MICROSCOPIC-ADD ON

## 2014-01-17 LAB — LIPASE, BLOOD: Lipase: 47 U/L (ref 11–59)

## 2014-01-17 LAB — PREGNANCY, URINE: Preg Test, Ur: NEGATIVE

## 2014-01-17 LAB — PRO B NATRIURETIC PEPTIDE: PRO B NATRI PEPTIDE: 16.5 pg/mL (ref 0–125)

## 2014-01-17 MED ORDER — MORPHINE SULFATE 4 MG/ML IJ SOLN
4.0000 mg | Freq: Once | INTRAMUSCULAR | Status: AC
  Administered 2014-01-17: 4 mg via INTRAVENOUS
  Filled 2014-01-17: qty 1

## 2014-01-17 MED ORDER — CIPROFLOXACIN HCL 500 MG PO TABS: 500.0000 mg | ORAL_TABLET | Freq: Two times a day (BID) | ORAL | Status: DC

## 2014-01-17 MED ORDER — ONDANSETRON HCL 4 MG/2ML IJ SOLN
4.0000 mg | Freq: Once | INTRAMUSCULAR | Status: AC
  Administered 2014-01-17: 4 mg via INTRAVENOUS
  Filled 2014-01-17: qty 2

## 2014-01-17 MED ORDER — TRAMADOL HCL 50 MG PO TABS: 50.0000 mg | ORAL_TABLET | Freq: Four times a day (QID) | ORAL | Status: DC | PRN

## 2014-01-17 MED ORDER — DEXTROSE 5 % IV SOLN
1.0000 g | Freq: Once | INTRAVENOUS | Status: AC
  Administered 2014-01-17: 1 g via INTRAVENOUS
  Filled 2014-01-17: qty 10

## 2014-01-17 MED ORDER — IBUPROFEN 600 MG PO TABS: 600.0000 mg | ORAL_TABLET | Freq: Four times a day (QID) | ORAL | Status: DC | PRN

## 2014-01-17 NOTE — ED Notes (Signed)
MD at bedside. 

## 2014-01-17 NOTE — ED Notes (Signed)
AVS given to patient. Knows not to drink/drive with Tramadol. Knows to take entire regimen of Ciprofloxacin. No other questions/concerns.

## 2014-01-17 NOTE — ED Provider Notes (Signed)
CSN: [id number]     Arrival date & time 01/17/14  1155 History   First MD Initiated Contact with Patient 01/17/14 1224     Chief Complaint  Patient presents with  . Abdominal Pain  . Shortness of Breath     (Consider location/radiation/quality/duration/timing/severity/associated sxs/prior Treatment) HPI The patient persisted today she has developed pain in her right back and flank region is radiating down the right lower quadrant. She reports is very painful and sharp in nature. She reports is also making her legs ache. Both of the legs are aching but no weakness or numbness is associated. She also reports she has tenderness in her lower abdomen. She denies she's had pain or burning with urination. She denies nausea or vomiting. She has not had a fever. The patient was also she's noted that she feels somewhat short of breath. She has no chest pain associated. And that her ankles have been swollen. She reports both of her feet always hurt and she's been told that she has neuropathy but she is not sure about that. Past Medical History  Diagnosis Date  . Left knee pain   . Migraine   . Cancer     cervical cx at age 23  . Cervical cancer    Past Surgical History  Procedure Laterality Date  . Tubal ligation    . Cholecystectomy N/A 01/20/2013    Procedure: LAPAROSCOPIC CHOLECYSTECTOMY WITH INTRAOPERATIVE CHOLANGIOGRAM;  Surgeon: Shann Medal, MD;  Location: [facility];  Service: General;  Laterality: N/A;   Family History  Problem Relation Age of Onset  . Cancer Mother   . Colon cancer Mother   . Breast cancer Maternal Grandmother   . Breast cancer Paternal Grandmother    History  Substance Use Topics  . Smoking status: Never Smoker   . Smokeless tobacco: Never Used  . Alcohol Use: No   OB History    Gravida Para Term Preterm AB TAB SAB Ectopic Multiple Living   10 7 6 1      7      Review of Systems 10 Systems reviewed and are negative for acute change except as noted in the  HPI.   Allergies  Benadryl and Onion  Home Medications   Prior to Admission medications   Medication Sig Start Date End Date Taking? Authorizing Provider  diphenhydramine-acetaminophen (TYLENOL PM EXTRA STRENGTH) 25-500 MG TABS Take 2 tablets by mouth at bedtime as needed (pain/sleep).   Yes Historical Provider, MD  ciprofloxacin (CIPRO) 500 MG tablet Take 1 tablet (500 mg total) by mouth 2 (two) times daily. One po bid x 7 days 01/17/14   Charlesetta Shanks, MD  hydrOXYzine (ATARAX/VISTARIL) 25 MG tablet Take 1-2 tablets (25-50 mg total) by mouth every 6 (six) hours. Patient not taking: Reported on 12/07/2013 09/15/13   Orpah Greek, MD  ibuprofen (ADVIL,MOTRIN) 600 MG tablet Take 1 tablet (600 mg total) by mouth every 6 (six) hours as needed. 01/17/14   Charlesetta Shanks, MD  oxyCODONE-acetaminophen (PERCOCET/ROXICET) 5-325 MG per tablet Take 1-2 tablets by mouth every 6 (six) hours as needed. Patient not taking: Reported on 01/17/2014 12/07/13   Jasper Riling. Alvino Chapel, MD  predniSONE (DELTASONE) 20 MG tablet Take 3 tablets po qd x 2 days, then 2 tablets po qd x 2 days, then 1 tablet po qd x 2 days Patient not taking: Reported on 01/17/2014 10/06/13   Tammy L. Triplett, PA-C  traMADol (ULTRAM) 50 MG tablet Take 1 tablet (50 mg total) by mouth  every 6 (six) hours as needed. 01/17/14   Charlesetta Shanks, MD   BP 100/55 mmHg  Pulse 80  Temp(Src) 97.8 F (36.6 C) (Oral)  Resp 23  SpO2 100%  LMP  Physical Exam  Constitutional:  The patient is morbidly obese. She is nontoxic alert and well in appearance. She has no respiratory distress. Color is good.  HENT:  Head: Normocephalic and atraumatic.  Mouth/Throat: Oropharynx is clear and moist.  Eyes: EOM are normal. Pupils are equal, round, and reactive to light.  Cardiovascular: Normal rate, regular rhythm, normal heart sounds and intact distal pulses.   Pulmonary/Chest: Effort normal and breath sounds normal. No respiratory distress. She has no  wheezes. She has no rales. She exhibits no tenderness.  Abdominal: Soft. She exhibits no distension and no mass. There is tenderness (patient endorses tenderness in the suprapubic region without guarding or rebound. There is no palpable mass.). There is no guarding.  Positive for CVA tenderness on the right.  Musculoskeletal: Normal range of motion. She exhibits no edema or tenderness.  Patient identifies swelling in her ankles. However the examination is not for any edema. She has slightly thickened ankles with normal appearance otherwise there is no edema of the feet or the lower legs. The calves are soft and nontender.She reports that it hurts everywhere I touch her lower legs and that has been a chronic problem for her. The dorsalis pedis pulses are 2+ and intact in the feet are warm and dry.    ED Course  Procedures (including critical care time) Labs Review Labs Reviewed  CBC WITH DIFFERENTIAL - Abnormal; Notable for the following:    Hemoglobin 11.8 (*)    RDW 15.9 (*)    All other components within normal limits  COMPREHENSIVE METABOLIC PANEL - Abnormal; Notable for the following:    Creatinine, Ser 1.18 (*)    Albumin 2.9 (*)    Total Bilirubin <0.2 (*)    GFR calc non Af Amer 55 (*)    GFR calc Af Amer 64 (*)    All other components within normal limits  URINALYSIS, ROUTINE W REFLEX MICROSCOPIC - Abnormal; Notable for the following:    APPearance CLOUDY (*)    Nitrite POSITIVE (*)    Leukocytes, UA MODERATE (*)    All other components within normal limits  URINE MICROSCOPIC-ADD ON - Abnormal; Notable for the following:    Bacteria, UA MANY (*)    All other components within normal limits  LIPASE, BLOOD  PREGNANCY, URINE  PRO B NATRIURETIC PEPTIDE  TROPONIN I  I-STAT TROPOININ, ED    Imaging Review Dg Chest 1 View  01/17/2014   CLINICAL DATA:  Chest pain, shortness of breath  EXAM: CHEST - 1 VIEW  COMPARISON:  12/07/2013  FINDINGS: The heart size and mediastinal  contours are within normal limits. Both lungs are clear. The visualized skeletal structures are unremarkable.  IMPRESSION: No active disease.   Electronically Signed   By: Inez Catalina M.D.   On: 01/17/2014 13:31   Ct Renal Stone Study  01/17/2014   CLINICAL DATA:  History of cervical carcinoma with right lower quadrant pain  EXAM: CT ABDOMEN AND PELVIS WITHOUT CONTRAST  TECHNIQUE: Multidetector CT imaging of the abdomen and pelvis was performed following the standard protocol without IV contrast.  COMPARISON:  12/07/2013  FINDINGS: The lung bases are free of acute infiltrate or sizable effusion.  The gallbladder has been surgically removed. The liver, spleen, adrenal glands and pancreas are all  normal in their CT appearance. The kidneys are well visualized bilaterally without renal calculi or obstructive change. The appendix is air-filled and within normal limits. Minimal diverticular change is noted without diverticulitis. Uterus and bladder appear within normal limits. No acute bony abnormality is noted.  IMPRESSION: No acute abnormality is noted. No significant change from the prior exam is seen.   Electronically Signed   By: Inez Catalina M.D.   On: 01/17/2014 13:33     EKG Interpretation   Date/Time:  Tuesday January 17 2014 12:35:12 EST Ventricular Rate:  79 PR Interval:  157 QRS Duration: 67 QT Interval:  378 QTC Calculation: 433 R Axis:   37 Text Interpretation:  Sinus rhythm Low voltage, precordial leads ST elev,  probable normal early repol pattern Baseline wander in lead(s) I III aVL  normal. no change from old Confirmed by Johnney Killian, MD, Jeannie Done [phone]) on  01/17/2014 3:03:55 PM      MDM   Final diagnoses:  Pyelonephritis  Neuropathic pain of both feet   The patient has positive urinalysis with nitrates and leuk esterase present. A shins pain is of flank pain that radiates toward lower abdomen with reproducible suprapubic tenderness. Findings are consistent with a  pyelonephritis. The CT does not show any retained stone. At this point and the patient will be treated for infection. She had other complaint of her lower extremities. At this point examination lower extremities is normal. She has a diffuse pain pattern that has been chronic in nature consistent with the chronic neuropathic type of pain. The patient was advised to find a family physician for continued outpatient evaluation and management.    Charlesetta Shanks, MD 01/17/14 [phone]

## 2014-01-17 NOTE — Discharge Instructions (Signed)
Pyelonephritis, Adult Pyelonephritis is a kidney infection. In general, there are 2 main types of pyelonephritis:  Infections that come on quickly without any warning (acute pyelonephritis).  Infections that persist for a long period of time (chronic pyelonephritis). CAUSES  Two main causes of pyelonephritis are:  Bacteria traveling from the bladder to the kidney. This is a problem especially in pregnant women. The urine in the bladder can become filled with bacteria from multiple causes, including:  Inflammation of the prostate gland (prostatitis).  Sexual intercourse in females.  Bladder infection (cystitis).  Bacteria traveling from the bloodstream to the tissue part of the kidney. Problems that may increase your risk of getting a kidney infection include:  Diabetes.  Kidney stones or bladder stones.  Cancer.  Catheters placed in the bladder.  Other abnormalities of the kidney or ureter. SYMPTOMS   Abdominal pain.  Pain in the side or flank area.  Fever.  Chills.  Upset stomach.  Blood in the urine (dark urine).  Frequent urination.  Strong or persistent urge to urinate.  Burning or stinging when urinating. DIAGNOSIS  Your caregiver may diagnose your kidney infection based on your symptoms. A urine sample may also be taken. TREATMENT  In general, treatment depends on how severe the infection is.   If the infection is mild and caught early, your caregiver may treat you with oral antibiotics and send you home.  If the infection is more severe, the bacteria may have gotten into the bloodstream. This will require intravenous (IV) antibiotics and a hospital stay. Symptoms may include:  High fever.  Severe flank pain.  Shaking chills.  Even after a hospital stay, your caregiver may require you to be on oral antibiotics for a period of time.  Other treatments may be required depending upon the cause of the infection. HOME CARE INSTRUCTIONS   Take your  antibiotics as directed. Finish them even if you start to feel better.  Make an appointment to have your urine checked to make sure the infection is gone.  Drink enough fluids to keep your urine clear or pale yellow.  Take medicines for the bladder if you have urgency and frequency of urination as directed by your caregiver. SEEK IMMEDIATE MEDICAL CARE IF:   You have a fever or persistent symptoms for more than 2-3 days.  You have a fever and your symptoms suddenly get worse.  You are unable to take your antibiotics or fluids.  You develop shaking chills.  You experience extreme weakness or fainting.  There is no improvement after 2 days of treatment. MAKE SURE YOU:  Understand these instructions.  Will watch your condition.  Will get help right away if you are not doing well or get worse. Document Released: 01/20/2005 Document Revised: 07/22/2011 Document Reviewed: 06/26/2010 [facility] Patient Information 2015 [place], [place]. This information is not intended to replace advice given to you by your health care provider. Make sure you discuss any questions you have with your health care provider.  Emergency Department Resource Guide 1) Find a Doctor and Pay Out of Pocket Although you won't have to find out who is covered by your insurance plan, it is a good idea to ask around and get recommendations. You will then need to call the office and see if the doctor you have chosen will accept you as a new patient and what types of options they offer for patients who are self-pay. Some doctors offer discounts or will set up payment plans for their patients who  do not have insurance, but you will need to ask so you aren't surprised when you get to your appointment.  2) Contact Your Local Health Department Not all health departments have doctors that can see patients for sick visits, but many do, so it is worth a call to see if yours does. If you don't know where your local health department  is, you can check in your phone book. The CDC also has a tool to help you locate your state's health department, and many state websites also have listings of all of their local health departments.  3) Find a [facility] If your illness is not likely to be very severe or complicated, you may want to try a walk in clinic. These are popping up all over the country in pharmacies, drugstores, and shopping centers. They're usually staffed by nurse practitioners or physician assistants that have been trained to treat common illnesses and complaints. They're usually fairly quick and inexpensive. However, if you have serious medical issues or chronic medical problems, these are probably not your best option.  No Primary Care Doctor: - Call Health Connect at  [phone] - they can help you locate a primary care doctor that  accepts your insurance, provides certain services, etc. - Physician Referral Service- [phone]  Chronic Pain Problems: Organization         Address  Phone   Notes  [facility]  [phone] Patients need to be referred by their primary care doctor.   Medication Assistance: Organization         Address  Phone   Notes  [facility] Medication [facility] [address] [place], [place] [address] [phone] --Must be a resident of [facility] -- Must have NO insurance coverage whatsoever (no Medicaid/ Medicare, etc.) -- The pt. MUST have a primary care doctor that directs their care regularly and follows them in the community   Med[facility]ist  [phone]   [employer]  [phone]    Agencies that provide inexpensive medical care: Organization         Address  Phone   Notes  [place]  [phone]   [facility]    [phone]   [facility] [place], [place] [address] [phone]   [place] [address] [place]  [phone]   Planned Parenthood    [phone]   [facility]    [phone]   [place] and [place], [place] Phone:  [phone], Fax:  [phone] Hours of Operation:  9 am - 6 pm, M-F.  Also accepts Medicaid/Medicare and self-pay.  [facility] for [place], [address], [place] Phone: [phone], Fax: [phone]. Hours of Operation:  8:30 am - 5:30 pm, M-F.  Also accepts Medicaid and self-pay.  [facility] [place] [address], [place] Phone: [phone]   [place], [place], [place] [phone], Ext. [phone] Mondays & Thursdays: 7-9 AM.  First 15 patients are seen on a first come, first serve basis.    [place] Providers:  Organization         Address  Phone   Notes  [facility] [address], [place] [phone] Also accepts self-pay patients.  [facility]  [place], [place]  [phone]   [place], [address], [place] [phone]   [facility] [address], [place] [phone]   Lucianne Lei [address], [place]   [phone] Only accepts [place] Access [place] patients after they have their name applied to their card.   Self-Pay (no insurance) in [facility]:  Organization         Address  Phone   Notes  Sickle Cell Patients, [facility] [place] [phone]   [facility] [place] [phone]   [facility] [place]  [place], [place], [place] [phone]   [facility]/Dr. Osei-Bonsu  [address], [place] or [address], [place] [phone] Phone number for both [place] and [place]  locations is the same.  [facility] [address], [place] [phone]   [facility] [address], [place] or [address] [phone]   [facility] [address] [place] [phone], phone; [phone], fax Sees patients 1st and 3rd Saturday of every month.  Must not qualify for public or private insurance (i.e. Medicaid, Medicare, [place] Health Choice, Veterans' Benefits)  Household income should be no more than 200% of the poverty level The clinic cannot treat you if you are pregnant or think you are pregnant  Sexually transmitted diseases are not treated at the clinic.    Dental Care: Organization         Address  Phone  Notes  [facility] Department of [facility] [place] [phone] Accepts children up to age 52 who are enrolled in [place] or [place]; pregnant women with a Medicaid card; and children who have applied for Medicaid or [place] Health Choice, but were declined, whose parents can pay a reduced fee at time of service.  [facility] Department of [facility]  [address], [place] [phone] Accepts children up to age 8 who are enrolled in [place] or [place]; pregnant women with a Medicaid card; and children who have applied for Medicaid or [place] Health Choice, but were declined, whose parents can pay a reduced fee at time of service.  [facility] Adult Dental Access PROGRAM  [place] [phone] Patients are seen by appointment only. Walk-ins are not accepted. [place] will see patients 84 years of age and older. Monday - Tuesday (8am-5pm) Most Wednesdays (8:30-5pm) $30 per visit, cash only  [facility] Adult Dental Access PROGRAM  [address], [facility] [phone] Patients are seen by appointment only. Walk-ins are not accepted. [place]  will see patients 69 years of age and older. One Wednesday Evening (Monthly: Volunteer Based).  $30 per visit, cash only  [place]  [phone] for adults; Children under age 74, call Graduate Pediatric Dentistry at [phone]. Children aged 77-14, please call [phone] to request a pediatric application.  Dental services are provided in all areas of dental care including fillings, crowns and bridges, complete and partial dentures, implants, gum treatment, root canals, and extractions. Preventive care is also provided. Treatment is provided to both adults and children. Patients  are selected via a lottery and there is often a waiting list.   [facility] [address] [place]  [phone] [email or web address]   [facility] [address] [place], [place] [phone], Ext. [phone] Second and Fourth Thursday of each month, opens at 6:30 AM; Clinic ends at 9 AM.  Patients are seen on a first-come first-served basis, and a limited number are seen during each clinic.   [facility]  [address] Hillard Danker [place], [place] [phone]   Eligibility Requirements You must have lived in [place], [place], or [place] counties for at least the last three months.   You cannot be eligible for state or federal sponsored [employer], including [employer], [place], or [employer].   You generally cannot be eligible for healthcare insurance through your employer.    How to apply: Eligibility screenings are held every Tuesday and Wednesday afternoon from 1:00 pm until 4:00 pm. You do not need an appointment for the interview!  [facility] [address], [place], [place]   [place]  [place] Department  [place]  [phone]    Behavioral Health Resources in the Community: Intensive Outpatient  Programs Organization         Address  Phone  Notes  [place]. [address], [place], [place] [phone]   [facility] Outpatient [address] [place], [place]   ADS: Alcohol & Drug Svcs [address] [place], [place]   [place] [address],  [place], [place] or [phone]   Substance Abuse Resources Organization         Address  Phone  Notes  Alcohol and Drug Services  [phone]   [place]  [phone]   [facility] [place]   Chinita Pester  [phone]   Residential & Outpatient Substance Abuse Program  [phone]   Psychological Services Organization         Address  Phone  Notes  [facility]  [place]  [phone]   [place] [address] [place] or [phone]    Mobile Crisis Teams Organization         Address  Phone  Notes  [facility], Mobile Crisis Care Unit  [phone]   [facility]  [address] [place], [place]   [place] [address] [place] [phone]    Self-Help/Support Groups Organization         Address  Phone             Notes  [place]. of [place] - variety of support groups  [place] Call for more information  Narcotics Anonymous (NA), [facility] [address], [employer]   2 meetings at this location   Special educational needs teacher         Address  Phone  Notes  [facility]idential Treatment [place],    [place]  [phone]   [facility]  [address] [place] [id number], [place], [place]   [place] [address], [place] [phone] Admissions: 8am-3pm M-F  Incentives Substance [place] [address]    [place], [place]   [facility]  [address] Jacinto [employer], [place]   [facility] [address],  [place], [place]   [facility] Programs - Intensive Outpatient [address] Kristeen [address], [place], [place]   [facility] ([place].) [place].,  [place], [place] [phone] or [phone]   [facility] (RTS) [address], [place], [place] Accepts Medicaid  [facility] [place] [address]  [place] [phone] Substance Abuse/Addiction Treatment   [facility] Organization         Address  Phone  Notes  [facility]  [phone]   Domenic Schwab, PhD [address] Arlis Porta [place], [place]   [phone] or [phone]   [place] Athe[place], [place] [phone]   [address], [place], [place] [phone] Insurance/Medicaid/sponsorship through [facility] and Families [address], [address] [place]                                    [place], [place] [phone] [place] [address], [place] [phone]    Dr. Adele Schilder  [phone]   [facility] Dept. 1) [address] [place] 2) [place] 3)  [place] [address], [place] [phone]  [phone]   [place] [phone] or [phone] (After Hours)

## 2014-01-17 NOTE — ED Notes (Addendum)
Pt c/o right lower quadrant abdominal pain, tender on palpation,  Lower back pain and numbness radiating to bilateral lower extremities. Pt c/o SOB and bilateral non-pitting ankle edema.

## 2014-02-01 ENCOUNTER — Emergency Department (HOSPITAL_COMMUNITY)
Admission: EM | Admit: 2014-02-01 | Discharge: 2014-02-01 | Disposition: A | Discharge status: Home / Self Care | Payer: Self-pay | Attending: Emergency Medicine | Admitting: Emergency Medicine

## 2014-02-01 ENCOUNTER — Encounter (HOSPITAL_COMMUNITY): Payer: Self-pay | Admitting: Emergency Medicine

## 2014-02-01 DIAGNOSIS — Y9389 Activity, other specified: Secondary | ICD-10-CM | POA: Insufficient documentation

## 2014-02-01 DIAGNOSIS — Z8541 Personal history of malignant neoplasm of cervix uteri: Secondary | ICD-10-CM | POA: Insufficient documentation

## 2014-02-01 DIAGNOSIS — Y998 Other external cause status: Secondary | ICD-10-CM | POA: Insufficient documentation

## 2014-02-01 DIAGNOSIS — Z8679 Personal history of other diseases of the circulatory system: Secondary | ICD-10-CM | POA: Insufficient documentation

## 2014-02-01 DIAGNOSIS — X58XXXA Exposure to other specified factors, initial encounter: Secondary | ICD-10-CM | POA: Insufficient documentation

## 2014-02-01 DIAGNOSIS — S39012A Strain of muscle, fascia and tendon of lower back, initial encounter: Secondary | ICD-10-CM | POA: Insufficient documentation

## 2014-02-01 DIAGNOSIS — Y9289 Other specified places as the place of occurrence of the external cause: Secondary | ICD-10-CM | POA: Insufficient documentation

## 2014-02-01 DIAGNOSIS — S3991XA Unspecified injury of abdomen, initial encounter: Secondary | ICD-10-CM | POA: Insufficient documentation

## 2014-02-01 LAB — URINALYSIS, ROUTINE W REFLEX MICROSCOPIC
Bilirubin Urine: NEGATIVE
Glucose, UA: NEGATIVE mg/dL
Hgb urine dipstick: NEGATIVE
KETONES UR: NEGATIVE mg/dL
LEUKOCYTES UA: NEGATIVE
Nitrite: NEGATIVE
Protein, ur: NEGATIVE mg/dL
SPECIFIC GRAVITY, URINE: 1.015 (ref 1.005–1.030)
Urobilinogen, UA: 0.2 mg/dL (ref 0.0–1.0)
pH: 7 (ref 5.0–8.0)

## 2014-02-01 MED ORDER — OXYCODONE-ACETAMINOPHEN 5-325 MG PO TABS
2.0000 | ORAL_TABLET | Freq: Once | ORAL | Status: AC
  Administered 2014-02-01: 2 via ORAL
  Filled 2014-02-01: qty 2

## 2014-02-01 MED ORDER — PREDNISONE 10 MG PO TABS: ORAL_TABLET | ORAL | Status: DC

## 2014-02-01 MED ORDER — HYDROCODONE-ACETAMINOPHEN 5-325 MG PO TABS: 2.0000 | ORAL_TABLET | ORAL | Status: DC | PRN

## 2014-02-01 NOTE — ED Provider Notes (Signed)
CSN: [id number]     Arrival date & time 02/01/14  1148 History   First MD Initiated Contact with Patient 02/01/14 1216     Chief Complaint  Patient presents with  . Back Pain     (Consider location/radiation/quality/duration/timing/severity/associated sxs/prior Treatment) Patient is a 43 y.o. female presenting with back pain. The history is provided by the patient. No language interpreter was used.  Back Pain Location:  Generalized Quality:  Aching Radiates to:  R posterior upper leg Pain severity:  Severe Pain is:  Same all the time Onset quality:  Gradual Duration:  4 days Timing:  Constant Progression:  Worsening Chronicity:  New Context: lifting heavy objects   Relieved by:  Nothing Worsened by:  Nothing tried Ineffective treatments:  None tried Associated symptoms: abdominal pain   Associated symptoms: no fever     Past Medical History  Diagnosis Date  . Left knee pain   . Migraine   . Cancer     cervical cx at age 9  . Cervical cancer    Past Surgical History  Procedure Laterality Date  . Tubal ligation    . Cholecystectomy N/A 01/20/2013    Procedure: LAPAROSCOPIC CHOLECYSTECTOMY WITH INTRAOPERATIVE CHOLANGIOGRAM;  Surgeon: Shann Medal, MD;  Location: [facility];  Service: General;  Laterality: N/A;   Family History  Problem Relation Age of Onset  . Cancer Mother   . Colon cancer Mother   . Breast cancer Maternal Grandmother   . Breast cancer Paternal Grandmother    History  Substance Use Topics  . Smoking status: Never Smoker   . Smokeless tobacco: Never Used  . Alcohol Use: No   OB History    Gravida Para Term Preterm AB TAB SAB Ectopic Multiple Living   10 7 6 1      7      Review of Systems  Constitutional: Negative for fever.  Gastrointestinal: Positive for abdominal pain.  Musculoskeletal: Positive for back pain.  All other systems reviewed and are negative.     Allergies  Benadryl and Onion  Home Medications   Prior to  Admission medications   Medication Sig Start Date End Date Taking? Authorizing Provider  diphenhydramine-acetaminophen (TYLENOL PM EXTRA STRENGTH) 25-500 MG TABS Take 2 tablets by mouth at bedtime as needed (pain/sleep).   Yes Historical Provider, MD  ibuprofen (ADVIL,MOTRIN) 600 MG tablet Take 1 tablet (600 mg total) by mouth every 6 (six) hours as needed. 01/17/14  Yes Charlesetta Shanks, MD  ciprofloxacin (CIPRO) 500 MG tablet Take 1 tablet (500 mg total) by mouth 2 (two) times daily. One po bid x 7 days Patient not taking: Reported on 02/01/2014 01/17/14   Charlesetta Shanks, MD  hydrOXYzine (ATARAX/VISTARIL) 25 MG tablet Take 1-2 tablets (25-50 mg total) by mouth every 6 (six) hours. Patient not taking: Reported on 12/07/2013 09/15/13   Orpah Greek, MD  oxyCODONE-acetaminophen (PERCOCET/ROXICET) 5-325 MG per tablet Take 1-2 tablets by mouth every 6 (six) hours as needed. Patient not taking: Reported on 01/17/2014 12/07/13   Jasper Riling. Alvino Chapel, MD  predniSONE (DELTASONE) 20 MG tablet Take 3 tablets po qd x 2 days, then 2 tablets po qd x 2 days, then 1 tablet po qd x 2 days Patient not taking: Reported on 01/17/2014 10/06/13   Tammy L. Triplett, PA-C  traMADol (ULTRAM) 50 MG tablet Take 1 tablet (50 mg total) by mouth every 6 (six) hours as needed. Patient not taking: Reported on 02/01/2014 01/17/14   Charlesetta Shanks, MD  BP 104/45 mmHg  Pulse 88  Temp(Src) 98.2 F (36.8 C) (Oral)  Resp 18  Ht 5\' 5"  (1.651 m)  Wt 250 lb (113.399 kg)  BMI 41.60 kg/m2  SpO2 98%  LMP 01/25/2014 Physical Exam  Constitutional: She is oriented to person, place, and time. She appears well-developed and well-nourished.  HENT:  Head: Normocephalic and atraumatic.  Eyes: EOM are normal. Pupils are equal, round, and reactive to light.  Neck: Normal range of motion.  Cardiovascular: Normal rate.   Pulmonary/Chest: Effort normal.  Abdominal: Soft. She exhibits no distension.  Musculoskeletal:  Tender lower  lumbar spine decreased range of motion pain right leg with straight leg raise neurovascular neurosensory are intact  Neurological: She is alert and oriented to person, place, and time.  Psychiatric: She has a normal mood and affect.  Nursing note and vitals reviewed.   ED Course  Procedures (including critical care time) Labs Review Labs Reviewed  URINE CULTURE  URINALYSIS, ROUTINE W REFLEX MICROSCOPIC   Urine is negative Imaging Review No results found.   EKG Interpretation None      MDM  patient given prescription for prednisone taper and hydrocodone. She is advised to follow-up with orthopedist for evaluation if pain persist    Final diagnoses:  Lumbar strain, initial encounter        Fransico Meadow, PA-C 02/01/14 [place], MD 02/03/14 [phone]

## 2014-02-01 NOTE — Discharge Instructions (Signed)
Sciatica Sciatica is pain, weakness, numbness, or tingling along the path of the sciatic nerve. The nerve starts in the lower back and runs down the back of each leg. The nerve controls the muscles in the lower leg and in the back of the knee, while also providing sensation to the back of the thigh, lower leg, and the sole of your foot. Sciatica is a symptom of another medical condition. For instance, nerve damage or certain conditions, such as a herniated disk or bone spur on the spine, pinch or put pressure on the sciatic nerve. This causes the pain, weakness, or other sensations normally associated with sciatica. Generally, sciatica only affects one side of the body. CAUSES   Herniated or slipped disc.  Degenerative disk disease.  A pain disorder involving the narrow muscle in the buttocks (piriformis syndrome).  Pelvic injury or fracture.  Pregnancy.  Tumor (rare). SYMPTOMS  Symptoms can vary from mild to very severe. The symptoms usually travel from the low back to the buttocks and down the back of the leg. Symptoms can include:  Mild tingling or dull aches in the lower back, leg, or hip.  Numbness in the back of the calf or sole of the foot.  Burning sensations in the lower back, leg, or hip.  Sharp pains in the lower back, leg, or hip.  Leg weakness.  Severe back pain inhibiting movement. These symptoms may get worse with coughing, sneezing, laughing, or prolonged sitting or standing. Also, being overweight may worsen symptoms. DIAGNOSIS  Your caregiver will perform a physical exam to look for common symptoms of sciatica. He or she may ask you to do certain movements or activities that would trigger sciatic nerve pain. Other tests may be performed to find the cause of the sciatica. These may include:  Blood tests.  X-rays.  Imaging tests, such as an MRI or CT scan. TREATMENT  Treatment is directed at the cause of the sciatic pain. Sometimes, treatment is not necessary  and the pain and discomfort goes away on its own. If treatment is needed, your caregiver may suggest:  Over-the-counter medicines to relieve pain.  Prescription medicines, such as anti-inflammatory medicine, muscle relaxants, or narcotics.  Applying heat or ice to the painful area.  Steroid injections to lessen pain, irritation, and inflammation around the nerve.  Reducing activity during periods of pain.  Exercising and stretching to strengthen your abdomen and improve flexibility of your spine. Your caregiver may suggest losing weight if the extra weight makes the back pain worse.  Physical therapy.  Surgery to eliminate what is pressing or pinching the nerve, such as a bone spur or part of a herniated disk. HOME CARE INSTRUCTIONS   Only take over-the-counter or prescription medicines for pain or discomfort as directed by your caregiver.  Apply ice to the affected area for 20 minutes, 3-4 times a day for the first 48-72 hours. Then try heat in the same way.  Exercise, stretch, or perform your usual activities if these do not aggravate your pain.  Attend physical therapy sessions as directed by your caregiver.  Keep all follow-up appointments as directed by your caregiver.  Do not wear high heels or shoes that do not provide proper support.  Check your mattress to see if it is too soft. A firm mattress may lessen your pain and discomfort. SEEK IMMEDIATE MEDICAL CARE IF:   You lose control of your bowel or bladder (incontinence).  You have increasing weakness in the lower back, pelvis, buttocks,   or legs.  You have redness or swelling of your back.  You have a burning sensation when you urinate.  You have pain that gets worse when you lie down or awakens you at night.  Your pain is worse than you have experienced in the past.  Your pain is lasting longer than 4 weeks.  You are suddenly losing weight without reason. MAKE SURE YOU:  Understand these  instructions.  Will watch your condition.  Will get help right away if you are not doing well or get worse. Document Released: 01/14/2001 Document Revised: 07/22/2011 Document Reviewed: 06/01/2011 ExitCare Patient Information 2015 [employer]. This information is not intended to replace advice given to you by your health care provider. Make sure you discuss any questions you have with your health care provider.  

## 2014-02-01 NOTE — ED Notes (Signed)
Patient complaining of left-sided back pain radiating down left leg x 4 days.

## 2014-02-01 NOTE — ED Notes (Signed)
Pain lt low back Felt a "pinching " sensation in low back few days ago, then increased pain down lt leg.  When getting off toilet , fell due to pain when getting up.  Increased pain with motion, lt leg movement.  Alert, NAD

## 2014-02-02 LAB — URINE CULTURE: Colony Count: >=100000

## 2014-02-13 ENCOUNTER — Emergency Department (HOSPITAL_COMMUNITY): Payer: No Typology Code available for payment source

## 2014-02-13 ENCOUNTER — Emergency Department (HOSPITAL_COMMUNITY)
Admission: EM | Admit: 2014-02-13 | Discharge: 2014-02-13 | Disposition: A | Discharge status: Home / Self Care | Payer: No Typology Code available for payment source | Attending: Emergency Medicine | Admitting: Emergency Medicine

## 2014-02-13 ENCOUNTER — Encounter (HOSPITAL_COMMUNITY): Payer: Self-pay | Admitting: *Deleted

## 2014-02-13 DIAGNOSIS — Z8541 Personal history of malignant neoplasm of cervix uteri: Secondary | ICD-10-CM | POA: Diagnosis not present

## 2014-02-13 DIAGNOSIS — M62830 Muscle spasm of back: Secondary | ICD-10-CM | POA: Insufficient documentation

## 2014-02-13 DIAGNOSIS — G43909 Migraine, unspecified, not intractable, without status migrainosus: Secondary | ICD-10-CM | POA: Insufficient documentation

## 2014-02-13 DIAGNOSIS — Z87828 Personal history of other (healed) physical injury and trauma: Secondary | ICD-10-CM | POA: Insufficient documentation

## 2014-02-13 DIAGNOSIS — M546 Pain in thoracic spine: Secondary | ICD-10-CM | POA: Diagnosis present

## 2014-02-13 DIAGNOSIS — R52 Pain, unspecified: Secondary | ICD-10-CM

## 2014-02-13 LAB — CBC WITH DIFFERENTIAL/PLATELET
BASOS ABS: 0.1 10*3/uL (ref 0.0–0.1)
BASOS PCT: 1 % (ref 0–1)
Eosinophils Absolute: 0.1 10*3/uL (ref 0.0–0.7)
Eosinophils Relative: 1 % (ref 0–5)
HCT: 37.5 % (ref 36.0–46.0)
Hemoglobin: 12 g/dL (ref 12.0–15.0)
LYMPHS ABS: 2.1 10*3/uL (ref 0.7–4.0)
Lymphocytes Relative: 29 % (ref 12–46)
MCH: 27 pg (ref 26.0–34.0)
MCHC: 32 g/dL (ref 30.0–36.0)
MCV: 84.3 fL (ref 78.0–100.0)
MONO ABS: 0.9 10*3/uL (ref 0.1–1.0)
MONOS PCT: 12 % (ref 3–12)
NEUTROS PCT: 57 % (ref 43–77)
Neutro Abs: 4.2 10*3/uL (ref 1.7–7.7)
PLATELETS: 308 10*3/uL (ref 150–400)
RBC: 4.45 MIL/uL (ref 3.87–5.11)
RDW: 15.8 % — AB (ref 11.5–15.5)
WBC: 7.4 10*3/uL (ref 4.0–10.5)

## 2014-02-13 LAB — BASIC METABOLIC PANEL
ANION GAP: 8 (ref 5–15)
BUN: 13 mg/dL (ref 6–23)
CALCIUM: 8.1 mg/dL — AB (ref 8.4–10.5)
CO2: 24 mmol/L (ref 19–32)
Chloride: 109 mEq/L (ref 96–112)
Creatinine, Ser: 1.28 mg/dL — ABNORMAL HIGH (ref 0.50–1.10)
GFR, EST AFRICAN AMERICAN: 58 mL/min — AB (ref >90)
GFR, EST NON AFRICAN AMERICAN: 50 mL/min — AB (ref >90)
Glucose, Bld: 86 mg/dL (ref 70–99)
POTASSIUM: 3.5 mmol/L (ref 3.5–5.1)
Sodium: 141 mmol/L (ref 135–145)

## 2014-02-13 LAB — D-DIMER, QUANTITATIVE: D-Dimer, Quant: 0.29 ug/mL-FEU (ref 0.00–0.48)

## 2014-02-13 MED ORDER — IBUPROFEN 600 MG PO TABS: 600.0000 mg | ORAL_TABLET | Freq: Four times a day (QID) | ORAL | Status: DC | PRN

## 2014-02-13 MED ORDER — CYCLOBENZAPRINE HCL 10 MG PO TABS: 10.0000 mg | ORAL_TABLET | Freq: Two times a day (BID) | ORAL | Status: DC | PRN

## 2014-02-13 MED ORDER — TRAMADOL HCL 50 MG PO TABS: 50.0000 mg | ORAL_TABLET | Freq: Four times a day (QID) | ORAL | Status: DC | PRN

## 2014-02-13 MED ORDER — HYDROMORPHONE HCL 1 MG/ML IJ SOLN
1.0000 mg | Freq: Once | INTRAMUSCULAR | Status: AC
  Administered 2014-02-13: 1 mg via INTRAVENOUS
  Filled 2014-02-13: qty 1

## 2014-02-13 NOTE — ED Notes (Signed)
Pt alert & oriented x4, stable gait. Patient given discharge instructions, paperwork & prescription(s). Patient  instructed to stop at the registration desk to finish any additional paperwork. Patient verbalized understanding. Pt left department w/ no further questions. 

## 2014-02-13 NOTE — Discharge Instructions (Signed)

## 2014-02-13 NOTE — ED Provider Notes (Signed)
CSN: [id number]     Arrival date & time 02/13/14  1330 History  This chart was scribed for Kristi Rank, MD by Edison Simon, ED Scribe. This patient was seen in room APA18/APA18 and the patient's care was started at 5:48 PM.    Chief Complaint  Patient presents with  . Back Pain   The history is provided by the patient. No language interpreter was used.    HPI Comments: Kristi Martin is a 44 y.o. female who presents to the Emergency Department complaining of sharp, left-sided back pain with onset 2 weeks ago, worsening last night. She locates it to her left middle back down to her buttocks. She was evaluated for the same on 02/01/14 and diagnosed with lumbar sprain, but states it is worse now. She states she has been using steroids and pain medication without remission of symptoms. She reports associated SOB with exertion. She denies recent falls or injuries. She states she has not had similar problems before. She states she has an appointment with Dr. Aline Brochure for this on the 19th. She denies fever, cough, vomiting, or diarrhea.  No PCP per patient, just got insurance on 02/03/2013  Past Medical History  Diagnosis Date  . Left knee pain   . Migraine   . Cancer     cervical cx at age 22  . Cervical cancer    Past Surgical History  Procedure Laterality Date  . Tubal ligation    . Cholecystectomy N/A 01/20/2013    Procedure: LAPAROSCOPIC CHOLECYSTECTOMY WITH INTRAOPERATIVE CHOLANGIOGRAM;  Surgeon: Shann Medal, MD;  Location: [facility];  Service: General;  Laterality: N/A;   Family History  Problem Relation Age of Onset  . Cancer Mother   . Colon cancer Mother   . Breast cancer Maternal Grandmother   . Breast cancer Paternal Grandmother    History  Substance Use Topics  . Smoking status: Never Smoker   . Smokeless tobacco: Never Used  . Alcohol Use: No   OB History    Gravida Para Term Preterm AB TAB SAB Ectopic Multiple Living   10 7 6 1      7      Review of Systems   Constitutional: Negative for fever.  Respiratory: Negative for cough.   Gastrointestinal: Negative for vomiting and diarrhea.  Musculoskeletal: Positive for back pain.  All other systems reviewed and are negative.     Allergies  Benadryl and Onion  Home Medications   Prior to Admission medications   Medication Sig Start Date End Date Taking? Authorizing Provider  predniSONE (DELTASONE) 10 MG tablet 6,5,4,3,2,1 taper Patient taking differently: Take 10-60 mg by mouth See admin instructions. 6,5,4,3,2,1 taper 02/01/14  Yes Fransico Meadow, PA-C  cyclobenzaprine (FLEXERIL) 10 MG tablet Take 1 tablet (10 mg total) by mouth 2 (two) times daily as needed for muscle spasms. 02/13/14   Kristi Rank, MD  ibuprofen (ADVIL,MOTRIN) 600 MG tablet Take 1 tablet (600 mg total) by mouth every 6 (six) hours as needed. 02/13/14   Kristi Rank, MD  traMADol (ULTRAM) 50 MG tablet Take 1 tablet (50 mg total) by mouth every 6 (six) hours as needed. 02/13/14   Kristi Rank, MD   BP 126/77 mmHg  Pulse 93  Temp(Src) 98.1 F (36.7 C) (Oral)  Resp 18  Ht 5\' 5"  (1.651 m)  Wt 255 lb (115.667 kg)  BMI 42.43 kg/m2  SpO2 99%  LMP 01/25/2014 Physical Exam  Constitutional: She appears well-developed and well-nourished. No distress.  HENT:  Head: Normocephalic and atraumatic.  Right Ear: External ear normal.  Left Ear: External ear normal.  Eyes: Conjunctivae are normal. Right eye exhibits no discharge. Left eye exhibits no discharge. No scleral icterus.  Neck: Neck supple. No tracheal deviation present.  Cardiovascular: Normal rate, regular rhythm and intact distal pulses.   Pulmonary/Chest: Effort normal and breath sounds normal. No stridor. No respiratory distress. She has no wheezes. She has no rales.  Abdominal: Soft. Bowel sounds are normal. She exhibits no distension. There is no tenderness. There is no rebound and no guarding.  Musculoskeletal: She exhibits no edema.       Thoracic back: She exhibits  tenderness and bony tenderness. She exhibits no swelling, no edema and no deformity.       Back:  Hyperesthetic  Neurological: She is alert. She has normal strength. No cranial nerve deficit (no facial droop, extraocular movements intact, no slurred speech) or sensory deficit. She exhibits normal muscle tone. She displays no seizure activity. Coordination normal.  Skin: Skin is warm and dry. No rash noted.  Psychiatric: She has a normal mood and affect.  Nursing note and vitals reviewed.   ED Course  Procedures (including critical care time)  DIAGNOSTIC STUDIES: Oxygen Saturation is 99% on room air, normal by my interpretation.    COORDINATION OF CARE: 5:53 PM Discussed treatment plan with patient at beside, the patient agrees with the plan and has no further questions at this time.   Labs Review Labs Reviewed  CBC WITH DIFFERENTIAL - Abnormal; Notable for the following:    RDW 15.8 (*)    All other components within normal limits  BASIC METABOLIC PANEL - Abnormal; Notable for the following:    Creatinine, Ser 1.28 (*)    Calcium 8.1 (*)    GFR calc non Af Amer 50 (*)    GFR calc Af Amer 58 (*)    All other components within normal limits  D-DIMER, QUANTITATIVE    Imaging Review Dg Chest 2 View  02/13/2014   CLINICAL DATA:  Left lateral chest pain extending into the left flank. Shortness of breath.  EXAM: CHEST  2 VIEW  COMPARISON:  Radiographs dated 01/17/2014 and 08/25/2012  FINDINGS: The heart size and mediastinal contours are within normal limits. Both lungs are clear. The visualized skeletal structures are unremarkable.  IMPRESSION: Normal exam.   Electronically Signed   By: Rozetta Nunnery M.D.   On: 02/13/2014 20:20    Medications  HYDROmorphone (DILAUDID) injection 1 mg (1 mg Intravenous Given 02/13/14 1804)     MDM   Final diagnoses:  Pain  Back muscle spasm   Doubt PE.  No pna or ptx on xray.  Suspect musculoskeletal pain.  Pt is very senstive to very light  touch.  No rash to suggest zoster.   At this time there does not appear to be any evidence of an acute emergency medical condition and the patient appears stable for discharge with appropriate outpatient follow up.   I personally performed the services described in this documentation, which was scribed in my presence.  The recorded information has been reviewed and is accurate.    Kristi Rank, MD 02/13/14 2040

## 2014-02-13 NOTE — ED Notes (Signed)
Pain lt leg and lt arm, onset 12/30 ,  Pain in low back Seen here for same, says no better, now increased pain in lt arm and difficulty moving her arm due to pain.  Has appt with Dr Aline Brochure for this 12/19

## 2014-02-21 ENCOUNTER — Ambulatory Visit: Payer: Self-pay | Admitting: Orthopedic Surgery

## 2014-03-12 ENCOUNTER — Emergency Department (HOSPITAL_COMMUNITY): Payer: No Typology Code available for payment source

## 2014-03-12 ENCOUNTER — Emergency Department (HOSPITAL_COMMUNITY)
Admission: EM | Admit: 2014-03-12 | Discharge: 2014-03-12 | Disposition: A | Discharge status: Home / Self Care | Payer: No Typology Code available for payment source | Attending: Emergency Medicine | Admitting: Emergency Medicine

## 2014-03-12 ENCOUNTER — Encounter (HOSPITAL_COMMUNITY): Payer: Self-pay | Admitting: Emergency Medicine

## 2014-03-12 DIAGNOSIS — M546 Pain in thoracic spine: Secondary | ICD-10-CM | POA: Insufficient documentation

## 2014-03-12 DIAGNOSIS — G8929 Other chronic pain: Secondary | ICD-10-CM | POA: Diagnosis not present

## 2014-03-12 DIAGNOSIS — M5430 Sciatica, unspecified side: Secondary | ICD-10-CM | POA: Insufficient documentation

## 2014-03-12 DIAGNOSIS — G43909 Migraine, unspecified, not intractable, without status migrainosus: Secondary | ICD-10-CM | POA: Insufficient documentation

## 2014-03-12 DIAGNOSIS — Z8541 Personal history of malignant neoplasm of cervix uteri: Secondary | ICD-10-CM | POA: Insufficient documentation

## 2014-03-12 DIAGNOSIS — Z7982 Long term (current) use of aspirin: Secondary | ICD-10-CM | POA: Insufficient documentation

## 2014-03-12 DIAGNOSIS — M549 Dorsalgia, unspecified: Secondary | ICD-10-CM

## 2014-03-12 DIAGNOSIS — Z79899 Other long term (current) drug therapy: Secondary | ICD-10-CM | POA: Diagnosis not present

## 2014-03-12 HISTORY — DX: Dorsalgia, unspecified: M54.9

## 2014-03-12 HISTORY — DX: Other chronic pain: G89.29

## 2014-03-12 HISTORY — DX: Sciatica, unspecified side: M54.30

## 2014-03-12 LAB — I-STAT CHEM 8, ED
BUN: 12 mg/dL (ref 6–23)
Calcium, Ion: 1.15 mmol/L (ref 1.12–1.23)
Chloride: 104 mmol/L (ref 96–112)
Creatinine, Ser: 1.4 mg/dL — ABNORMAL HIGH (ref 0.50–1.10)
Glucose, Bld: 104 mg/dL — ABNORMAL HIGH (ref 70–99)
HEMATOCRIT: 33 % — AB (ref 36.0–46.0)
Hemoglobin: 11.2 g/dL — ABNORMAL LOW (ref 12.0–15.0)
Potassium: 3.9 mmol/L (ref 3.5–5.1)
Sodium: 141 mmol/L (ref 135–145)
TCO2: 22 mmol/L (ref 0–100)

## 2014-03-12 LAB — I-STAT TROPONIN, ED: Troponin i, poc: 0 ng/mL (ref 0.00–0.08)

## 2014-03-12 MED ORDER — METHOCARBAMOL 500 MG PO TABS
1000.0000 mg | ORAL_TABLET | Freq: Four times a day (QID) | ORAL | Status: DC | PRN
Start: 2014-03-12 — End: 2014-04-11

## 2014-03-12 MED ORDER — DIAZEPAM 5 MG PO TABS
5.0000 mg | ORAL_TABLET | Freq: Once | ORAL | Status: AC
  Administered 2014-03-12: 5 mg via ORAL
  Filled 2014-03-12: qty 1

## 2014-03-12 MED ORDER — OXYCODONE-ACETAMINOPHEN 5-325 MG PO TABS: ORAL_TABLET | ORAL | Status: DC

## 2014-03-12 MED ORDER — OXYCODONE-ACETAMINOPHEN 5-325 MG PO TABS
2.0000 | ORAL_TABLET | Freq: Once | ORAL | Status: AC
  Administered 2014-03-12: 2 via ORAL
  Filled 2014-03-12: qty 2

## 2014-03-12 NOTE — Discharge Instructions (Signed)
°Emergency Department Resource Guide °1) Find a Doctor and Pay Out of Pocket °Although you won't have to find out who is covered by your insurance plan, it is a good idea to ask around and get recommendations. You will then need to call the office and see if the doctor you have chosen will accept you as a new patient and what types of options they offer for patients who are self-pay. Some doctors offer discounts or will set up payment plans for their patients who do not have insurance, but you will need to ask so you aren't surprised when you get to your appointment. ° °2) Contact Your Local Health Department °Not all health departments have doctors that can see patients for sick visits, but many do, so it is worth a call to see if yours does. If you don't know where your local health department is, you can check in your phone book. The CDC also has a tool to help you locate your state's health department, and many state websites also have listings of all of their local health departments. ° °3) Find a Walk-in Clinic °If your illness is not likely to be very severe or complicated, you may want to try a walk in clinic. These are popping up all over the country in pharmacies, drugstores, and shopping centers. They're usually staffed by nurse practitioners or physician assistants that have been trained to treat common illnesses and complaints. They're usually fairly quick and inexpensive. However, if you have serious medical issues or chronic medical problems, these are probably not your best option. ° °No Primary Care Doctor: °- Call [facility] at  832-8000 - they can help you locate a primary care doctor that  accepts your insurance, provides certain services, etc. °- Physician Referral Service- 1-800-533-3463 ° °Chronic Pain Problems: °Organization         Address  Phone   Notes  °[facility]  (336) 297-2271 Patients need to be referred by their primary care doctor.  ° °Medication  Assistance: °Organization         Address  Phone   Notes  °[place] [facility] [address] °[place], [place] [address] (336) 641-8030 --Must be a resident of [place] °-- Must have NO insurance coverage whatsoever (no Medicaid/ Medicare, etc.) °-- The pt. MUST have a primary care doctor that directs their care regularly and follows them in the community °  °[facility]  (866) 331-1348   °[employer]  (888) 892-1162   ° °Agencies that provide inexpensive medical care: °Organization         Address  Phone   Notes  °[facility]  (336) 832-8035   °[facility]    (336) 832-7272   °[facility] [address] °[place], [place] [address] (336) 832-4777   °[facility] [address], °[place] (336) 271-4999   °[facility]    (336) 373-0678   °[facility]    (336) 272-1050   °[facility] ° [address], [place] Phone:  (336) 832-4444, Fax:  (336) 832-4440 Hours of Operation:  9 am - 6 pm, M-F.  Also accepts Medicaid/Medicare and self-pay.  °[facility] ° [address],  Phone: (336) 832-3150, Fax: (336) [phone]. Hours of Operation:  8:30 am - 5:30 pm, M-F.  Also accepts Medicaid and self-pay.  °[facility] [place] [address], [place] Phone: (336) 878-6027   °[facility] [address], [place], [place] (336)723-1848, Ext. 123 Mondays & Thursdays: 7-9 AM.  First 15 patients are seen on a first come, first serve basis. °  ° °Medicaid-accepting [place] Providers: ° °Organization         Address  Phone   Notes  °[facility] [address], [place] (336) 641-2100 Also accepts self-pay patients.  °[facility] [address], [place] ° (336) 856-9996   °[facility] [address], [place]  (336) 288-8857   °[facility] [address] [place] [address], [place] (336) 299-7000   °Veita Bland [address], [place]  ° (336) 373-1557 Only accepts [place] Access Medicaid patients after they have their name applied to their card.  ° °Self-Pay (no insurance) in [place]: ° °Organization         Address  Phone   Notes  °Sickle Cell Patients, [facility] [address], [place] (336) 832-1970   °[facility] [address], [place] (336) 832-4400   °[facility] ° [address], [place] (336) 992-4800   °[facility]/Dr. Osei-Bonsu ° [address] [place] [address], [place] or [address], [place] (336) 841-8500 Phone number for both [place] and [place] locations is the same.  °[facility] [address], [place] (336) 299-0000   °[facility] [address] [place] [address], [place] or [address] (336) 852-7530 °(336) 878-2260   °[facility] [address], [place] (336) 350-1642, phone; (336) 294-5005, fax Sees patients 1st and 3rd Saturday of every month.  Must not qualify for public or private insurance (i.e. Medicaid, Medicare, [place] Health Choice, Veterans' Benefits) • Household income should be no more than 200% of the poverty level •The clinic cannot treat you if you are pregnant or think you are pregnant • Sexually transmitted diseases are not treated at the clinic.  ° ° °Dental Care: °Organization         Address  Phone  Notes  °[place] [facility] [address], [place] (336) 641-6152 Accepts children up to age 21 who are enrolled in Medicaid or [place] Health Choice; pregnant women with a Medicaid card; and children who have applied for Medicaid or [place] Health Choice, but were declined, whose parents can pay a reduced fee at time of service.  °[place]  [facility] [place]  [address], [place] (336) 641-7733 Accepts children up to age 21 who are enrolled in Medicaid or [place] Health Choice; pregnant women with a Medicaid card; and children who have applied for Medicaid or [place] Health Choice, but were declined, whose parents can pay a reduced fee at time of service.  °[facility] ° [address], [place] (336) 641-4533 Patients are seen by appointment only. Walk-ins are not accepted. [facility] will see patients 18 years of age and older. °Monday - Tuesday (8am-5pm) °Most Wednesdays (8:30-5pm) °$30 per visit, cash only  °[facility] ° [address], [place] (336) 641-4533 Patients are seen by appointment only. Walk-ins are not accepted. [facility] will see patients 18 years of age and older. °One   Wednesday Evening (Monthly: Volunteer Based).  $30 per visit, cash only  °[facility]  (919) 537-3737 for adults; Children under age 4, call Graduate Pediatric Dentistry at (919) [phone]. Children aged 4-14, please call (919) [phone] to request a pediatric application. ° Dental services are provided in all areas of dental care including fillings, crowns and bridges, complete and partial dentures, implants, gum treatment, root canals, and extractions. Preventive care is also provided. Treatment is provided to both adults and children. °Patients are selected via a lottery and there is often a waiting list. °  °[facility] [address], °[place] ° (336) 763-8833 [email or web address] °  °[facility] [address], [place], [place] (336)723-1848, Ext. 123 Second and Fourth Thursday of each month, opens at 6:30 AM; Clinic ends at 9 AM.  Patients are seen on a first-come first-served basis, and a limited number are seen during each clinic.  ° °[facility] ° [address], [place], [place] (336) 723-7904    Eligibility Requirements °You must have lived in [place], or [place] counties for at least the last three months. °  You cannot be eligible for state or federal sponsored healthcare insurance, including Veterans Administration, Medicaid, or Medicare. °  You generally cannot be eligible for healthcare insurance through your employer.  °  How to apply: °Eligibility screenings are held every Tuesday and Wednesday afternoon from 1:00 pm until 4:00 pm. You do not need an appointment for the interview!  °[facility] [address], [place], [place] 336-631-2330   °[facility]  336-342-8273   °[facility]  336-703-3100   °[facility]  336-570-6415   ° °Behavioral Health Resources in the Community: °Intensive Outpatient Programs °Organization         Address  Phone  Notes  °[facility] [address], [place], [place] 336-878-6098   °[facility] [address], [place], [place] 336-832-9800   °[facility] [address], [place], [place] ° 336-882-2125   °[facility] [address],  °[place], [place] 1-800-853-5163 or 336-641-4981   °Substance Abuse Resources °Organization         Address  Phone  Notes  °[facility]  336-882-2125   °[facility]  336-784-9470   °[facility]  336-285-9073   °[facility]  336-845-3988   °Residential & Outpatient Substance Abuse Program  1-800-659-3381   °Psychological Services °Organization         Address  Phone  Notes  °[facility]  336- 832-9600   °[facility]  336- 378-7881   °[facility] [address], [place] 1-800-853-5163 or 336-641-4981   ° °Mobile Crisis Teams °Organization         Address  Phone  Notes  °[facility], Mobile Crisis Care Unit  1-877-626-1772   °Assertive °[facility] ° [address]  [place], [place] 336-834-9664   °Sharon DeEsch [address] °[place] 336-554-5454   ° °Self-Help/Support Groups °Organization         Address  Phone             Notes  °[facility] - variety of support groups  336- 373-1402 Call for more information  °Narcotics Anonymous (NA), [facility] [address], °[place]  2 meetings at this location  ° °  Residential Treatment Programs °Organization         Address  Phone  Notes  °[facility] [address],    °[place]  1-866-801-8205   °[facility] ° [address], [place], [place] 704-293-8524   °[facility] [address], [place] 336-845-3988 Admissions: 8am-3pm M-F  °[facility] [address]    °[place], [place] 336-841-1104   °[facility] [address], [place], [place] 336-379-7146   °[facility] [address]  °[place], [place] 336-285-9073   °[facility] - Intensive Outpatient [address], [place], [place] 336-852-3033   °[facility] [address]  °[place], [place] 1-877-615-2722 or 336-784-9470   °[facility] [address] [place], [place] 336-227-7417 Accepts Medicaid  °[facility] [address]  ° [place] 1-800-659-3381 Substance Abuse/Addiction Treatment  ° °[place] Behavioral Health Resources °Organization         Address  Phone  Notes  °[facility]  (888) 581-9988   °Julie Brannon, PhD [address] [place], [place]   (336) 349-5553 or (336) 951-0000   °[facility]   [address] °[place], [place] (336) 349-4454   °[facility] [address], [place], [place] (336) 342-8316 Insurance/Medicaid/sponsorship through [facility]  °[facility] [address]                                    [place], [place] (336) 342-8316 Therapy/tele-psych/case    °[facility] [address]  ° [place], [place] (336) 349-2233    °Dr. Arfeen  (336) 349-4544   °[facility] [place]  [employer] [place] [facility] 1) [address], [place] °2) [address], [place] °3)  [address], [place] (336) 349-3220 °(336) 342-7768 ° °(336) 342-8140   °[place] [facility] (336) 342-1394 or (336) 342-3537 (After Hours)    ° ° °Take the prescriptions as directed.  Apply moist heat or ice to the area(s) of discomfort, for 15 minutes at a time, several times per day for the next few days.  Do not fall asleep on a heating or ice pack.  Call your regular medical doctor tomorrow to schedule a follow up appointment this week.  Return to the Emergency Department immediately if worsening. ° °

## 2014-03-12 NOTE — ED Provider Notes (Signed)
CSN: [id number]     Arrival date & time 03/12/14  1922 History   First MD Initiated Contact with Patient 03/12/14 1928     Chief Complaint  Patient presents with  . Back Pain    radiates into her chest      HPI Pt was seen at 1940. Per pt, c/o gradual onset and persistence of constant acute flair of her chronic left sided thoracic back "pain" for the past 3 hours. Pt describes the pain as per her usual chronic pain pattern: "spasm," "cramping," with radiation into her anterior and lateral chest wall. The symptoms have been associated with no other complaints. The patient has a significant history of similar symptoms previously, recently being evaluated for this complaint and multiple prior evals for same. Denies any change in her usual chronic pain symptoms, no injury, no palpitations, no SOB/cough, no fevers, no abd pain, no N/V/D, no focal motor weakness, no tingling/numbness in extremities.     Past Medical History  Diagnosis Date  . Left knee pain   . Migraine   . Cancer     cervical cx at age 33  . Cervical cancer   . Chronic back pain   . Sciatica    Past Surgical History  Procedure Laterality Date  . Tubal ligation    . Cholecystectomy N/A 01/20/2013    Procedure: LAPAROSCOPIC CHOLECYSTECTOMY WITH INTRAOPERATIVE CHOLANGIOGRAM;  Surgeon: Shann Medal, MD;  Location: [facility];  Service: General;  Laterality: N/A;   Family History  Problem Relation Age of Onset  . Cancer Mother   . Colon cancer Mother   . Breast cancer Maternal Grandmother   . Breast cancer Paternal Grandmother    History  Substance Use Topics  . Smoking status: Never Smoker   . Smokeless tobacco: Never Used  . Alcohol Use: No   OB History    Gravida Para Term Preterm AB TAB SAB Ectopic Multiple Living   10 7 6 1      7      Review of Systems ROS: Statement: All systems negative except as marked or noted in the HPI; Constitutional: Negative for fever and chills. ; ; Eyes: Negative for eye pain,  redness and discharge. ; ; ENMT: Negative for ear pain, hoarseness, nasal congestion, sinus pressure and sore throat. ; ; Cardiovascular: Negative for chest pain, palpitations, diaphoresis, dyspnea and peripheral edema. ; ; Respiratory: Negative for cough, wheezing and stridor. ; ; Gastrointestinal: Negative for nausea, vomiting, diarrhea, abdominal pain, blood in stool, hematemesis, jaundice and rectal bleeding. . ; ; Genitourinary: Negative for dysuria, flank pain and hematuria. ; ; Musculoskeletal: +back pain. Negative for neck pain. Negative for swelling and trauma.; ; Skin: Negative for pruritus, rash, abrasions, blisters, bruising and skin lesion.; ; Neuro: Negative for headache, lightheadedness and neck stiffness. Negative for weakness, altered level of consciousness , altered mental status, extremity weakness, paresthesias, involuntary movement, seizure and syncope.      Allergies  Benadryl and Onion  Home Medications   Prior to Admission medications   Medication Sig Start Date End Date Taking? Authorizing Provider  cyclobenzaprine (FLEXERIL) 10 MG tablet Take 1 tablet (10 mg total) by mouth 2 (two) times daily as needed for muscle spasms. Patient not taking: Reported on 03/12/2014 02/13/14   [name], MD  ibuprofen (ADVIL,MOTRIN) 600 MG tablet Take 1 tablet (600 mg total) by mouth every 6 (six) hours as needed. Patient not taking: Reported on 03/12/2014 02/13/14   [name], MD  predniSONE (  DELTASONE) 10 MG tablet 6,5,4,3,2,1 taper Patient not taking: Reported on 03/12/2014 02/01/14   Fransico Meadow, PA-C   BP 135/92 mmHg  Pulse 97  Temp(Src) 97.5 F (36.4 C) (Oral)  Resp 17  Ht 5\' 6"  (1.676 m)  Wt 255 lb (115.667 kg)  BMI 41.18 kg/m2  SpO2 99%  LMP 03/05/2014 Physical Exam  1945: Physical examination:  Nursing notes reviewed; Vital signs and O2 SAT reviewed;  Constitutional: Well developed, Well nourished, Well hydrated. Anxious, tearful, moaning.;; Head:  Normocephalic, atraumatic;  Eyes: EOMI, PERRL, No scleral icterus; ENMT: Mouth and pharynx normal, Mucous membranes moist; Neck: Supple, Full range of motion, No lymphadenopathy; Cardiovascular: Regular rate and rhythm, No murmur, rub, or gallop; Respiratory: Breath sounds clear & equal bilaterally, No rales, rhonchi, wheezes.  Speaking full sentences, +intermittently hyperventilating on exam. Normal respiratory effort/excursion; Chest: +mild tenderness left parasternal area, no soft tissue crepitus, no rash, no deformity. Movement normal; Abdomen: Soft, Nontender, Nondistended, Normal bowel sounds; Genitourinary: No CVA tenderness; Spine:  No midline CS, TS, LS tenderness. +TTP left thoracic paraspinal muscles. No rash.;; Extremities: Pulses normal, No tenderness, No edema, No calf edema or asymmetry.; Neuro: AA&Ox3, Major CN grossly intact.  Speech clear. Grips equal. Strength 5/5 equal bilat UE's and LE's. No gross focal motor or sensory deficits in extremities.; Skin: Color normal, Warm, Dry.; Psych:  Anxious, tearful, moaning, hyperventilating.    ED Course  Procedures     EKG Interpretation   Date/Time:  Sunday March 12 2014 19:38:24 EST Ventricular Rate:  95 PR Interval:  152 QRS Duration: 68 QT Interval:  364 QTC Calculation: 458 R Axis:   52 Text Interpretation:  Sinus rhythm Baseline wander When compared with ECG  of 01/17/2014 No significant change was found Confirmed by MCCMANUS  MD,  Mariesha Venturella ([id number]) on 03/12/2014 8:20:56 PM      MDM  MDM Reviewed: previous chart, nursing note and vitals Reviewed previous: labs and ECG Interpretation: labs, ECG and x-ray    Results for orders placed or performed during the hospital encounter of 03/12/14  I-stat Chem 8, ED  Result Value Ref Range   Sodium 141 135 - 145 mmol/L   Potassium 3.9 3.5 - 5.1 mmol/L   Chloride 104 96 - 112 mmol/L   BUN 12 6 - 23 mg/dL   Creatinine, Ser 1.40 (H) 0.50 - 1.10 mg/dL   Glucose, Bld 104 (H) 70 - 99 mg/dL   Calcium, Ion  1.15 1.12 - 1.23 mmol/L   TCO2 22 0 - 100 mmol/L   Hemoglobin 11.2 (L) 12.0 - 15.0 g/dL   HCT 33.0 (L) 36.0 - 46.0 %  I-stat troponin, ED  Result Value Ref Range   Troponin i, poc 0.00 0.00 - 0.08 ng/mL   Comment 3           Dg Chest 2 View 03/12/2014   CLINICAL DATA:  back pain, dizziness, shortness of breath  EXAM: CHEST  2 VIEW  COMPARISON:  None.  FINDINGS: The heart size and mediastinal contours are within normal limits. Both lungs are clear. The visualized skeletal structures are unremarkable.  IMPRESSION: No active cardiopulmonary disease.   Electronically Signed   By: Gretchen  Green M.D.   On: 03/12/2014 20:42     21 15:  Pt given PO meds with good effect. States she "feels better." Pt is calmer and no longer hyperventilating. BUN/Cr near baseline per EPIC chart review. Doubt PE as cause for symptoms with low risk [place].  Doubt ACS as  cause for symptoms with normal troponin and unchanged EKG from previous, hx TIMI 0/HEART score 0, with atypical symptoms. Long hx of chronic back pain with multiple ED visits for same.  Pt endorses acute flair of her usual long standing chronic pain today, no change from her usual chronic pain pattern.  Pt encouraged to f/u with her PMD and Pain Management doctor for good continuity of care and control of her chronic pain.  Verb understanding.        Francine Graven, DO 03/15/14 2015

## 2014-03-12 NOTE — ED Notes (Signed)
Pt reports chest pain that started about 3 hours ago that has gotten worse. Pain to the chest, under left breast & left arm.

## 2014-03-28 ENCOUNTER — Emergency Department (HOSPITAL_COMMUNITY)
Admission: EM | Admit: 2014-03-28 | Discharge: 2014-03-28 | Disposition: A | Discharge status: Home / Self Care | Payer: No Typology Code available for payment source | Attending: Emergency Medicine | Admitting: Emergency Medicine

## 2014-03-28 ENCOUNTER — Encounter (HOSPITAL_COMMUNITY): Payer: Self-pay

## 2014-03-28 ENCOUNTER — Emergency Department (HOSPITAL_COMMUNITY): Payer: No Typology Code available for payment source

## 2014-03-28 DIAGNOSIS — G43909 Migraine, unspecified, not intractable, without status migrainosus: Secondary | ICD-10-CM | POA: Diagnosis not present

## 2014-03-28 DIAGNOSIS — G8929 Other chronic pain: Secondary | ICD-10-CM | POA: Insufficient documentation

## 2014-03-28 DIAGNOSIS — R112 Nausea with vomiting, unspecified: Secondary | ICD-10-CM | POA: Diagnosis not present

## 2014-03-28 DIAGNOSIS — Z9851 Tubal ligation status: Secondary | ICD-10-CM | POA: Insufficient documentation

## 2014-03-28 DIAGNOSIS — M545 Low back pain, unspecified: Secondary | ICD-10-CM

## 2014-03-28 DIAGNOSIS — Z8541 Personal history of malignant neoplasm of cervix uteri: Secondary | ICD-10-CM | POA: Insufficient documentation

## 2014-03-28 DIAGNOSIS — Z9049 Acquired absence of other specified parts of digestive tract: Secondary | ICD-10-CM | POA: Insufficient documentation

## 2014-03-28 DIAGNOSIS — R109 Unspecified abdominal pain: Secondary | ICD-10-CM | POA: Insufficient documentation

## 2014-03-28 LAB — BASIC METABOLIC PANEL
Anion gap: 4 — ABNORMAL LOW (ref 5–15)
BUN: 14 mg/dL (ref 6–23)
CALCIUM: 8.2 mg/dL — AB (ref 8.4–10.5)
CHLORIDE: 111 mmol/L (ref 96–112)
CO2: 25 mmol/L (ref 19–32)
CREATININE: 1.41 mg/dL — AB (ref 0.50–1.10)
GFR calc non Af Amer: 44 mL/min — ABNORMAL LOW (ref >90)
GFR, EST AFRICAN AMERICAN: 51 mL/min — AB (ref >90)
GLUCOSE: 140 mg/dL — AB (ref 70–99)
Potassium: 3.4 mmol/L — ABNORMAL LOW (ref 3.5–5.1)
Sodium: 140 mmol/L (ref 135–145)

## 2014-03-28 LAB — URINALYSIS, ROUTINE W REFLEX MICROSCOPIC
BILIRUBIN URINE: NEGATIVE
GLUCOSE, UA: NEGATIVE mg/dL
KETONES UR: NEGATIVE mg/dL
Nitrite: NEGATIVE
PH: 7 (ref 5.0–8.0)
Specific Gravity, Urine: 1.015 (ref 1.005–1.030)
Urobilinogen, UA: 0.2 mg/dL (ref 0.0–1.0)

## 2014-03-28 LAB — CBC WITH DIFFERENTIAL/PLATELET
BASOS ABS: 0 10*3/uL (ref 0.0–0.1)
BASOS PCT: 0 % (ref 0–1)
EOS ABS: 0 10*3/uL (ref 0.0–0.7)
Eosinophils Relative: 0 % (ref 0–5)
HCT: 32.4 % — ABNORMAL LOW (ref 36.0–46.0)
HEMOGLOBIN: 10.2 g/dL — AB (ref 12.0–15.0)
Lymphocytes Relative: 6 % — ABNORMAL LOW (ref 12–46)
Lymphs Abs: 0.6 10*3/uL — ABNORMAL LOW (ref 0.7–4.0)
MCH: 26.6 pg (ref 26.0–34.0)
MCHC: 31.5 g/dL (ref 30.0–36.0)
MCV: 84.6 fL (ref 78.0–100.0)
MONOS PCT: 11 % (ref 3–12)
Monocytes Absolute: 1.1 10*3/uL — ABNORMAL HIGH (ref 0.1–1.0)
NEUTROS PCT: 83 % — AB (ref 43–77)
Neutro Abs: 8.4 10*3/uL — ABNORMAL HIGH (ref 1.7–7.7)
Platelets: 211 10*3/uL (ref 150–400)
RBC: 3.83 MIL/uL — AB (ref 3.87–5.11)
RDW: 15.5 % (ref 11.5–15.5)
WBC: 10.1 10*3/uL (ref 4.0–10.5)

## 2014-03-28 LAB — URINE MICROSCOPIC-ADD ON

## 2014-03-28 MED ORDER — CYCLOBENZAPRINE HCL 10 MG PO TABS: 10.0000 mg | ORAL_TABLET | Freq: Two times a day (BID) | ORAL | Status: DC | PRN

## 2014-03-28 MED ORDER — HYDROCODONE-ACETAMINOPHEN 5-325 MG PO TABS: 1.0000 | ORAL_TABLET | Freq: Four times a day (QID) | ORAL | Status: DC | PRN

## 2014-03-28 MED ORDER — HYDROMORPHONE HCL 1 MG/ML IJ SOLN
1.0000 mg | Freq: Once | INTRAMUSCULAR | Status: AC
  Administered 2014-03-28: 1 mg via INTRAVENOUS
  Filled 2014-03-28: qty 1

## 2014-03-28 MED ORDER — SODIUM CHLORIDE 0.9 % IV SOLN
INTRAVENOUS | Status: DC
  Administered 2014-03-28: 20:00:00 via INTRAVENOUS

## 2014-03-28 MED ORDER — ONDANSETRON HCL 4 MG/2ML IJ SOLN
4.0000 mg | Freq: Once | INTRAMUSCULAR | Status: AC
  Administered 2014-03-28: 4 mg via INTRAVENOUS
  Filled 2014-03-28: qty 2

## 2014-03-28 MED ORDER — SODIUM CHLORIDE 0.9 % IV BOLUS (SEPSIS)
1000.0000 mL | Freq: Once | INTRAVENOUS | Status: AC
  Administered 2014-03-28: 1000 mL via INTRAVENOUS

## 2014-03-28 NOTE — Discharge Instructions (Signed)
Symptoms seem to be more consistent with musculoskeletal back pain. Negative workup with CT scan and lab work. Take pain medicine as directed. Take the Flexeril as directed. Rest for the next few days. Resource guide provided below to help you find a regular doctor.  In addition possible urinary tract infection. Urine culture sent. But understand that you have been on your menses. You will be contacted if the urine culture is consistent with urinary tract infection.    Emergency Department Resource Guide 1) Find a Doctor and Pay Out of Pocket Although you won't have to find out who is covered by your insurance plan, it is a good idea to ask around and get recommendations. You will then need to call the office and see if the doctor you have chosen will accept you as a new patient and what types of options they offer for patients who are self-pay. Some doctors offer discounts or will set up payment plans for their patients who do not have insurance, but you will need to ask so you aren't surprised when you get to your appointment.  2) Contact Your Local Health Department Not all health departments have doctors that can see patients for sick visits, but many do, so it is worth a call to see if yours does. If you don't know where your local health department is, you can check in your phone book. The CDC also has a tool to help you locate your state's health department, and many state websites also have listings of all of their local health departments.  3) Find a [facility] If your illness is not likely to be very severe or complicated, you may want to try a walk in clinic. These are popping up all over the country in pharmacies, drugstores, and shopping centers. They're usually staffed by nurse practitioners or physician assistants that have been trained to treat common illnesses and complaints. They're usually fairly quick and inexpensive. However, if you have serious medical issues or chronic  medical problems, these are probably not your best option.  No Primary Care Doctor: - Call Health Connect at  [phone] - they can help you locate a primary care doctor that  accepts your insurance, provides certain services, etc. - Physician Referral Service- [phone]  Chronic Pain Problems: Organization         Address  Phone   Notes  [facility]  [phone] Patients need to be referred by their primary care doctor.   Medication Assistance: Organization         Address  Phone   Notes  [facility] Medication [facility] [address] [place], [place] [address] [phone] --Must be a resident of [facility] -- Must have NO insurance coverage whatsoever (no Medicaid/ Medicare, etc.) -- The pt. MUST have a primary care doctor that directs their care regularly and follows them in the community   MedAssist  [phone]   [employer]  [phone]    Agencies that provide inexpensive medical care: Organization         Address  Phone   Notes  [place]  [phone]   [facility]    [phone]   [facility] [place], [place] [address] [phone]   [place] [address] [place] [phone]   Planned Parenthood    [phone]   [facility]    [phone]  [phone]   [address], [place] Phone:  [phone], Fax:  [phone] Hours of Operation:  9 am - 6 pm, M-F.  Also accepts Medicaid/Medicare and self-pay.  [facility] for [place] [address], [address], [place] Phone: [phone], Fax: [phone]. Hours of Operation:  8:30 am - 5:30 pm, M-F.  Also accepts Medicaid and self-pay.  [facility] [place] [address], [place] Phone: [phone]   [place], [place], [place] [phone],  Ext. 123 Mondays & Thursdays: 7-9 AM.  First 15 patients are seen on a first come, first serve basis.    [place] Providers:  Organization         Address  Phone   Notes  [facility] [address], [place] [phone] Also accepts self-pay patients.  [facility] [address], [place]  [phone]   [place], [address], [place] [phone]   [facility] [address], [place] [phone]   Lucianne Lei [address], [place]   [phone] Only accepts [place] Access [place] patients after they have their name applied to their card.   Self-Pay (no insurance) in [facility]:  Organization         Address  Phone   Notes  Sickle Cell Patients, [facility] Internal [facility] [place] [phone]   [facility] [place] [phone]   [facility] [place]  [place], [place], [place] [phone]   [facility]/Dr. Osei-Bonsu  [address] [place] or [address], [place] [phone] Phone number for both [place] and [place] locations is the same.  Urgent Medical and [facility] [address], [place] [phone]   [facility] [address], [place] or [address] [phone]   [facility] [address], [place] [phone], phone; [phone], fax Sees patients 1st and 3rd Saturday of every month.  Must not qualify for public or private insurance (i.e. Medicaid, Medicare, [place] Health Choice, Veterans' Benefits)  Household income should be no more than 200% of the poverty level The clinic cannot treat you if you are pregnant or think you are pregnant  Sexually transmitted diseases are not  treated at the clinic.    Dental Care: Organization         Address  Phone  Notes  [facility] Department of [facility] [phone] Accepts children up to age 26 who are enrolled in [place] or [place]; pregnant women with a Medicaid card; and children who have applied for Medicaid or [place] Health Choice, but were declined, whose parents can pay a reduced fee at time of service.  [facility] Department of [facility]  [address], [place] [phone] Accepts children up to age 64 who are enrolled in [place] or [facility]; pregnant women with a Medicaid card; and children who have applied for Medicaid or [place] Health Choice, but were declined, whose parents can pay a reduced fee at time of service.  [place]  Access PROGRAM  [place] [phone] Patients are seen by appointment only. Walk-ins are not accepted. [place] will see patients 108 years of age and older. Monday - Tuesday (8am-5pm) Most Wednesdays (8:30-5pm) $30 per visit, cash only  [facility] Adult Dental Access PROGRAM  [address], [facility] [phone] Patients are seen by appointment only. Walk-ins are not accepted. [place] will see patients 27 years of age and older. One Wednesday Evening (Monthly: Volunteer Based).  $30 per visit, cash only  [place]  [phone] for adults; Children under age 30, call Graduate Pediatric Dentistry at [phone]. Children aged 63-14, please call [phone] to request a pediatric application.  Dental services are provided in all areas of dental care including fillings, crowns and bridges, complete and partial dentures, implants, gum treatment, root canals, and extractions. Preventive care is also provided. Treatment is provided to both adults and children. Patients are selected via a lottery and there is  often a waiting list.   [facility] [address], [place]  [phone] [email or web address]   [facility] [address] [place], [place] [phone], Ext. 123 Second and Fourth Thursday of each month, opens at 6:30 AM; Clinic ends at 9 AM.  Patients are seen on a first-come first-served basis, and a limited number are seen during each clinic.   [facility]  [address] [place] Danker [place], [place] [phone]   Eligibility Requirements You must have lived in [place], [place], or [place] counties for at least the last three months.   You cannot be eligible for state or federal sponsored [employer], including [employer], [place], or [employer].   You generally cannot be eligible for healthcare insurance through your employer.    How to apply: Eligibility screenings are held every Tuesday and Wednesday afternoon from 1:00 pm until 4:00 pm. You do not need an appointment for the interview!  [facility] [address], [place], [place]   [place]  [place] Department  [place]  [phone]    Behavioral Health Resources in the Community: Intensive Outpatient Programs Organization         Address  Phone  Notes  [place]. [address] [place], [place] [phone]   [facility] Outpatient [address] [place], [place]   ADS: Alcohol & Drug Svcs [address], [place], [place]   [place] [address]  [place], [place] or [phone]   Substance Abuse Resources Organization         Address  Phone  Notes  Alcohol and Drug Services  [phone]   [place]  [phone]   [facility]   Chinita Pester  [phone]   Residential & Outpatient Substance  Abuse Program  [phone]   Psychological Services Organization         Address  Phone  Notes  [facility] [place]  [place]  [phone]   [facility] [address], [place] or [phone]    Mobile Crisis Teams Organization         Address  Phone  Notes  [facility], Mobile Crisis Care Unit  [phone]   [facility]  [address] [place],  [place] [address] [place] [phone]    Self-Help/Support Groups Organization         Address  Phone             Notes  Mental Health Assoc. of [place] - variety of support groups  [place] Call for more information  Narcotics Anonymous (NA), [facility] [address], [employer] [place]  2 meetings at this location   Special educational needs teacher         Address  Phone  Notes  [facility] Residential [facility] [place],    [place]  [phone]   [facility]  [address] [place] [phone], [place], [place]   [place], [place] [phone] Admissions: 8am-3pm M-F  Incentives Substance [place] [address],    [place], [place] [phone]   [facility] [address] [place], [place], [place]   [facility] [address],  [place], [place]   [facility] Programs - Intensive Outpatient [address] Kristeen [address], [place], [place]   [facility] ([place].,  [place], [place] [phone] or [phone]   [facility] Services (RTS) [address], [place], [place] Accepts Medicaid  [facility] [place] [address],  [place] [phone] Substance Abuse/Addiction Treatment   [facility] Organization          Address  Phone  Notes  [facility]  [phone]   Domenic Schwab, PhD [address] Arlis Porta [place], [place]   [phone] or [phone]   [place], [place] [phone]   [facility] [address], [place], [place] [phone] Insurance/Medicaid/sponsorship through [facility] and Families [address]                                    [place], [place] [phone] [place] [address], [place] [phone]    Dr. Adele Schilder  [phone]   [facility] Dept. 1) [address], [place] 2) [place] 3)  [place] [address], [place] [phone]  [phone]   [place] [phone] or [phone] (After Hours)

## 2014-03-28 NOTE — ED Notes (Signed)
Pt complain of pain that started in her low back and moved around to her right flank area.

## 2014-03-28 NOTE — ED Notes (Signed)
Pt was given zofran 4 mg and toradol 30 mg by EMS prior to arrival

## 2014-03-28 NOTE — ED Provider Notes (Signed)
CSN: [id number]     Arrival date & time 03/28/14  1759 History   First MD Initiated Contact with Patient 03/28/14 1809     Chief Complaint  Patient presents with  . Flank Pain     (Consider location/radiation/quality/duration/timing/severity/associated sxs/prior Treatment) Patient is a 44 y.o. female presenting with flank pain. The history is provided by the patient.  Flank Pain Pertinent negatives include no chest pain, no abdominal pain, no headaches and no shortness of breath.   patient with onset of bilateral low back pain right greater than left right flank pain vomiting 3 onset of symptoms was today. It was a sudden onset while patient was having a bowel movement. Patient has a history of back pain problems but states it does not feel like that. Patient has no neuro focal deficits. Patient denies any blood in her urine or any discomfort with urination.  Past Medical History  Diagnosis Date  . Left knee pain   . Migraine   . Cancer     cervical cx at age 63  . Cervical cancer   . Chronic back pain   . Sciatica    Past Surgical History  Procedure Laterality Date  . Tubal ligation    . Cholecystectomy N/A 01/20/2013    Procedure: LAPAROSCOPIC CHOLECYSTECTOMY WITH INTRAOPERATIVE CHOLANGIOGRAM;  Surgeon: Shann Medal, MD;  Location: [facility];  Service: General;  Laterality: N/A;   Family History  Problem Relation Age of Onset  . Cancer Mother   . Colon cancer Mother   . Breast cancer Maternal Grandmother   . Breast cancer Paternal Grandmother    History  Substance Use Topics  . Smoking status: Never Smoker   . Smokeless tobacco: Never Used  . Alcohol Use: Yes   OB History    Gravida Para Term Preterm AB TAB SAB Ectopic Multiple Living   10 7 6 1      7      Review of Systems  Constitutional: Negative for fever.  HENT: Negative for congestion.   Eyes: Negative for visual disturbance.  Respiratory: Negative for shortness of breath.   Cardiovascular: Negative for  chest pain.  Gastrointestinal: Positive for nausea and vomiting. Negative for abdominal pain.  Genitourinary: Positive for flank pain and vaginal bleeding. Negative for dysuria and hematuria.  Musculoskeletal: Positive for back pain. Negative for neck pain.  Skin: Negative for rash.  Neurological: Negative for headaches.  Hematological: Does not bruise/bleed easily.      Allergies  Benadryl and Onion  Home Medications   Prior to Admission medications   Medication Sig Start Date End Date Taking? Authorizing Provider  ibuprofen (ADVIL,MOTRIN) 600 MG tablet Take 1 tablet (600 mg total) by mouth every 6 (six) hours as needed. 02/13/14  Yes [name], MD  methocarbamol (ROBAXIN) 500 MG tablet Take 2 tablets (1,000 mg total) by mouth 4 (four) times daily as needed for muscle spasms (muscle spasm/pain). 03/12/14  Yes Francine Graven, DO  cyclobenzaprine (FLEXERIL) 10 MG tablet Take 1 tablet (10 mg total) by mouth 2 (two) times daily as needed for muscle spasms. Patient not taking: Reported on 03/12/2014 02/13/14   [name], MD  cyclobenzaprine (FLEXERIL) 10 MG tablet Take 1 tablet (10 mg total) by mouth 2 (two) times daily as needed for muscle spasms. 03/28/14   Fredia Sorrow, MD  HYDROcodone-acetaminophen (NORCO/VICODIN) 5-325 MG per tablet Take 1-2 tablets by mouth every 6 (six) hours as needed. 03/28/14   Fredia Sorrow, MD  oxyCODONE-acetaminophen (PERCOCET/ROXICET) 5-325 MG  per tablet 1 or 2 tabs PO q6h prn pain Patient not taking: Reported on 03/28/2014 03/12/14   Francine Graven, DO  predniSONE (DELTASONE) 10 MG tablet 6,5,4,3,2,1 taper Patient not taking: Reported on 03/12/2014 02/01/14   Fransico Meadow, PA-C   BP 114/85 mmHg  Pulse 119  Temp(Src) 101 F (38.3 C) (Oral)  Resp 20  Ht 5\' 4"  (1.626 m)  Wt 270 lb (122.471 kg)  BMI 46.32 kg/m2  SpO2 100%  LMP 03/05/2014 Physical Exam  Constitutional: She is oriented to person, place, and time. She appears well-developed and  well-nourished. No distress.  HENT:  Head: Normocephalic and atraumatic.  Mouth/Throat: Oropharynx is clear and moist.  Eyes: Conjunctivae and EOM are normal. Pupils are equal, round, and reactive to light.  Neck: Normal range of motion. Neck supple.  Cardiovascular: Normal rate, regular rhythm and normal heart sounds.   Pulmonary/Chest: Effort normal and breath sounds normal. No respiratory distress.  Abdominal: Soft. Bowel sounds are normal. There is no tenderness.  Musculoskeletal: Normal range of motion.  Neurological: She is alert and oriented to person, place, and time. No cranial nerve deficit. She exhibits normal muscle tone. Coordination normal.  Skin: Skin is warm. No rash noted.  Nursing note and vitals reviewed.   ED Course  Procedures (including critical care time) Labs Review Labs Reviewed  CBC WITH DIFFERENTIAL/PLATELET - Abnormal; Notable for the following:    RBC 3.83 (*)    Hemoglobin 10.2 (*)    HCT 32.4 (*)    Neutrophils Relative % 83 (*)    Neutro Abs 8.4 (*)    Lymphocytes Relative 6 (*)    Lymphs Abs 0.6 (*)    Monocytes Absolute 1.1 (*)    All other components within normal limits  BASIC METABOLIC PANEL - Abnormal; Notable for the following:    Potassium 3.4 (*)    Glucose, Bld 140 (*)    Creatinine, Ser 1.41 (*)    Calcium 8.2 (*)    GFR calc non Af Amer 44 (*)    GFR calc Af Amer 51 (*)    Anion gap 4 (*)    All other components within normal limits  URINALYSIS, ROUTINE W REFLEX MICROSCOPIC - Abnormal; Notable for the following:    Hgb urine dipstick SMALL (*)    Protein, ur TRACE (*)    Leukocytes, UA SMALL (*)    All other components within normal limits  URINE MICROSCOPIC-ADD ON - Abnormal; Notable for the following:    Bacteria, UA MANY (*)    All other components within normal limits   Results for orders placed or performed during the hospital encounter of 03/28/14  CBC with Differential/Platelet  Result Value Ref Range   WBC 10.1 4.0  - 10.5 K/uL   RBC 3.83 (L) 3.87 - 5.11 MIL/uL   Hemoglobin 10.2 (L) 12.0 - 15.0 g/dL   HCT 32.4 (L) 36.0 - 46.0 %   MCV 84.6 78.0 - 100.0 fL   MCH 26.6 26.0 - 34.0 pg   MCHC 31.5 30.0 - 36.0 g/dL   RDW 15.5 11.5 - 15.5 %   Platelets 211 150 - 400 K/uL   Neutrophils Relative % 83 (H) 43 - 77 %   Neutro Abs 8.4 (H) 1.7 - 7.7 K/uL   Lymphocytes Relative 6 (L) 12 - 46 %   Lymphs Abs 0.6 (L) 0.7 - 4.0 K/uL   Monocytes Relative 11 3 - 12 %   Monocytes Absolute 1.1 (H) 0.1 -  1.0 K/uL   Eosinophils Relative 0 0 - 5 %   Eosinophils Absolute 0.0 0.0 - 0.7 K/uL   Basophils Relative 0 0 - 1 %   Basophils Absolute 0.0 0.0 - 0.1 K/uL  Basic metabolic panel  Result Value Ref Range   Sodium 140 135 - 145 mmol/L   Potassium 3.4 (L) 3.5 - 5.1 mmol/L   Chloride 111 96 - 112 mmol/L   CO2 25 19 - 32 mmol/L   Glucose, Bld 140 (H) 70 - 99 mg/dL   BUN 14 6 - 23 mg/dL   Creatinine, Ser 1.41 (H) 0.50 - 1.10 mg/dL   Calcium 8.2 (L) 8.4 - 10.5 mg/dL   GFR calc non Af Amer 44 (L) >90 mL/min   GFR calc Af Amer 51 (L) >90 mL/min   Anion gap 4 (L) 5 - 15  Urinalysis, Routine w reflex microscopic  Result Value Ref Range   Color, Urine YELLOW YELLOW   APPearance CLEAR CLEAR   Specific Gravity, Urine 1.015 1.005 - 1.030   pH 7.0 5.0 - 8.0   Glucose, UA NEGATIVE NEGATIVE mg/dL   Hgb urine dipstick SMALL (A) NEGATIVE   Bilirubin Urine NEGATIVE NEGATIVE   Ketones, ur NEGATIVE NEGATIVE mg/dL   Protein, ur TRACE (A) NEGATIVE mg/dL   Urobilinogen, UA 0.2 0.0 - 1.0 mg/dL   Nitrite NEGATIVE NEGATIVE   Leukocytes, UA SMALL (A) NEGATIVE  Urine microscopic-add on  Result Value Ref Range   Squamous Epithelial / LPF RARE RARE   WBC, UA 11-20 <3 WBC/hpf   RBC / HPF 7-10 <3 RBC/hpf   Bacteria, UA MANY (A) RARE     Imaging Review Ct Renal Stone Study  03/28/2014   CLINICAL DATA:  Right flank pain.  Cervical cancer.  EXAM: CT ABDOMEN AND PELVIS WITHOUT CONTRAST  TECHNIQUE: Multidetector CT imaging of the  abdomen and pelvis was performed following the standard protocol without IV contrast.  COMPARISON:  CT abdomen 01/17/2014  FINDINGS: Lung bases are clear  Liver and spleen are normal. Gallbladder is been removed. Bile ducts nondilated. Pancreas is normal  Normal kidneys. No renal obstruction or mass. No renal calculi. No ureteral or bladder calculus.  Negative for bowel obstruction or bowel thickening. Mild sigmoid diverticulosis. No free fluid or free air. Normal appendix  No skeletal lesion.  IMPRESSION: Negative for renal calculi  Normal appendix  No acute abnormality.   Electronically Signed   By: Franchot Gallo M.D.   On: 03/28/2014 20:47     EKG Interpretation None      MDM   Final diagnoses:  Flank pain  Bilateral low back pain without sciatica   Patient's symptoms seem to be consistent with musculoskeletal back pain. Patient just finishing up her menses. The urinalysis could be consistent with urinary tract infection but patient has no dysuria type symptoms. Urine culture sent. If positive patient will be contacted. In the meantime patient will be treated as musculoskeletal back pain. CT scan negative for any evidence of kidney stones appendix was negative. No other significant abnormalities. Renal function without significant normality is no leukocytosis. Patient improved here with pain medicine.    Fredia Sorrow, MD 03/28/14 2123

## 2014-03-31 ENCOUNTER — Emergency Department (HOSPITAL_COMMUNITY): Payer: No Typology Code available for payment source

## 2014-03-31 ENCOUNTER — Encounter (HOSPITAL_COMMUNITY): Payer: Self-pay | Admitting: *Deleted

## 2014-03-31 ENCOUNTER — Inpatient Hospital Stay (HOSPITAL_COMMUNITY)
Admission: EM | Admit: 2014-03-31 | Discharge: 2014-04-03 | DRG: 871 | Disposition: A | Discharge status: Home / Self Care | Payer: No Typology Code available for payment source | Attending: Internal Medicine | Admitting: Internal Medicine

## 2014-03-31 DIAGNOSIS — A419 Sepsis, unspecified organism: Secondary | ICD-10-CM | POA: Diagnosis not present

## 2014-03-31 DIAGNOSIS — Z803 Family history of malignant neoplasm of breast: Secondary | ICD-10-CM

## 2014-03-31 DIAGNOSIS — N12 Tubulo-interstitial nephritis, not specified as acute or chronic: Secondary | ICD-10-CM | POA: Diagnosis present

## 2014-03-31 DIAGNOSIS — R6521 Severe sepsis with septic shock: Secondary | ICD-10-CM | POA: Diagnosis present

## 2014-03-31 DIAGNOSIS — N183 Chronic kidney disease, stage 3 unspecified: Secondary | ICD-10-CM | POA: Diagnosis present

## 2014-03-31 DIAGNOSIS — E7439 Other disorders of intestinal carbohydrate absorption: Secondary | ICD-10-CM | POA: Diagnosis present

## 2014-03-31 DIAGNOSIS — Z9049 Acquired absence of other specified parts of digestive tract: Secondary | ICD-10-CM | POA: Diagnosis present

## 2014-03-31 DIAGNOSIS — Z800 Family history of malignant neoplasm of digestive organs: Secondary | ICD-10-CM | POA: Diagnosis not present

## 2014-03-31 DIAGNOSIS — D631 Anemia in chronic kidney disease: Secondary | ICD-10-CM | POA: Diagnosis present

## 2014-03-31 DIAGNOSIS — R0789 Other chest pain: Secondary | ICD-10-CM

## 2014-03-31 DIAGNOSIS — R7302 Impaired glucose tolerance (oral): Secondary | ICD-10-CM

## 2014-03-31 DIAGNOSIS — Z8541 Personal history of malignant neoplasm of cervix uteri: Secondary | ICD-10-CM

## 2014-03-31 DIAGNOSIS — A4151 Sepsis due to Escherichia coli [E. coli]: Secondary | ICD-10-CM

## 2014-03-31 DIAGNOSIS — G8929 Other chronic pain: Secondary | ICD-10-CM | POA: Diagnosis present

## 2014-03-31 DIAGNOSIS — Z86711 Personal history of pulmonary embolism: Secondary | ICD-10-CM

## 2014-03-31 DIAGNOSIS — Z791 Long term (current) use of non-steroidal anti-inflammatories (NSAID): Secondary | ICD-10-CM

## 2014-03-31 DIAGNOSIS — R079 Chest pain, unspecified: Secondary | ICD-10-CM | POA: Insufficient documentation

## 2014-03-31 DIAGNOSIS — D649 Anemia, unspecified: Secondary | ICD-10-CM | POA: Diagnosis present

## 2014-03-31 DIAGNOSIS — Z7982 Long term (current) use of aspirin: Secondary | ICD-10-CM | POA: Diagnosis not present

## 2014-03-31 LAB — CBC WITH DIFFERENTIAL/PLATELET
Basophils Absolute: 0 10*3/uL (ref 0.0–0.1)
Basophils Relative: 0 % (ref 0–1)
Eosinophils Absolute: 0 10*3/uL (ref 0.0–0.7)
Eosinophils Relative: 0 % (ref 0–5)
HEMATOCRIT: 30 % — AB (ref 36.0–46.0)
HEMOGLOBIN: 9.7 g/dL — AB (ref 12.0–15.0)
Lymphocytes Relative: 7 % — ABNORMAL LOW (ref 12–46)
Lymphs Abs: 0.6 10*3/uL — ABNORMAL LOW (ref 0.7–4.0)
MCH: 26.4 pg (ref 26.0–34.0)
MCHC: 32.3 g/dL (ref 30.0–36.0)
MCV: 81.7 fL (ref 78.0–100.0)
MONO ABS: 1 10*3/uL (ref 0.1–1.0)
MONOS PCT: 11 % (ref 3–12)
NEUTROS ABS: 7.4 10*3/uL (ref 1.7–7.7)
Neutrophils Relative %: 82 % — ABNORMAL HIGH (ref 43–77)
Platelets: 223 10*3/uL (ref 150–400)
RBC: 3.67 MIL/uL — AB (ref 3.87–5.11)
RDW: 15.3 % (ref 11.5–15.5)
WBC: 9.1 10*3/uL (ref 4.0–10.5)

## 2014-03-31 LAB — URINE MICROSCOPIC-ADD ON

## 2014-03-31 LAB — URINALYSIS, ROUTINE W REFLEX MICROSCOPIC
Bilirubin Urine: NEGATIVE
GLUCOSE, UA: NEGATIVE mg/dL
Ketones, ur: NEGATIVE mg/dL
Nitrite: POSITIVE — AB
PH: 6 (ref 5.0–8.0)
Protein, ur: 30 mg/dL — AB
SPECIFIC GRAVITY, URINE: 1.01 (ref 1.005–1.030)
Urobilinogen, UA: 0.2 mg/dL (ref 0.0–1.0)

## 2014-03-31 LAB — COMPREHENSIVE METABOLIC PANEL
ALBUMIN: 2.7 g/dL — AB (ref 3.5–5.2)
ALT: 23 U/L (ref 0–35)
AST: 30 U/L (ref 0–37)
Alkaline Phosphatase: 95 U/L (ref 39–117)
Anion gap: 2 — ABNORMAL LOW (ref 5–15)
BUN: 11 mg/dL (ref 6–23)
CHLORIDE: 111 mmol/L (ref 96–112)
CO2: 22 mmol/L (ref 19–32)
Calcium: 7.6 mg/dL — ABNORMAL LOW (ref 8.4–10.5)
Creatinine, Ser: 1.58 mg/dL — ABNORMAL HIGH (ref 0.50–1.10)
GFR calc Af Amer: 45 mL/min — ABNORMAL LOW (ref >90)
GFR calc non Af Amer: 39 mL/min — ABNORMAL LOW (ref >90)
Glucose, Bld: 138 mg/dL — ABNORMAL HIGH (ref 70–99)
Potassium: 3.2 mmol/L — ABNORMAL LOW (ref 3.5–5.1)
SODIUM: 135 mmol/L (ref 135–145)
TOTAL PROTEIN: 6.4 g/dL (ref 6.0–8.3)
Total Bilirubin: 0.7 mg/dL (ref 0.3–1.2)

## 2014-03-31 LAB — URINE CULTURE: Colony Count: >=100000

## 2014-03-31 LAB — BRAIN NATRIURETIC PEPTIDE: B NATRIURETIC PEPTIDE 5: 35 pg/mL (ref 0.0–100.0)

## 2014-03-31 LAB — I-STAT CG4 LACTIC ACID, ED: Lactic Acid, Venous: 1.47 mmol/L (ref 0.5–2.0)

## 2014-03-31 LAB — TROPONIN I: Troponin I: <0.03 ng/mL (ref <0.031)

## 2014-03-31 LAB — D-DIMER, QUANTITATIVE: D-Dimer, Quant: 1.62 ug/mL-FEU — ABNORMAL HIGH (ref 0.00–0.48)

## 2014-03-31 MED ORDER — MORPHINE SULFATE 4 MG/ML IJ SOLN: 4.0000 mg | Freq: Once | INTRAMUSCULAR | Status: DC

## 2014-03-31 MED ORDER — LORAZEPAM 2 MG/ML IJ SOLN
0.5000 mg | Freq: Once | INTRAMUSCULAR | Status: AC
  Administered 2014-03-31: 0.5 mg via INTRAVENOUS
  Filled 2014-03-31: qty 1

## 2014-03-31 MED ORDER — IOHEXOL 350 MG/ML SOLN
100.0000 mL | Freq: Once | INTRAVENOUS | Status: AC | PRN
  Administered 2014-03-31: 100 mL via INTRAVENOUS

## 2014-03-31 MED ORDER — MORPHINE SULFATE 4 MG/ML IJ SOLN
4.0000 mg | Freq: Once | INTRAMUSCULAR | Status: AC
  Administered 2014-03-31: 4 mg via INTRAVENOUS
  Filled 2014-03-31: qty 1

## 2014-03-31 MED ORDER — DEXTROSE 5 % IV SOLN
1.0000 g | Freq: Once | INTRAVENOUS | Status: AC
  Administered 2014-03-31: 1 g via INTRAVENOUS
  Filled 2014-03-31: qty 10

## 2014-03-31 MED ORDER — ACETAMINOPHEN 325 MG PO TABS
650.0000 mg | ORAL_TABLET | Freq: Once | ORAL | Status: AC
  Administered 2014-03-31: 650 mg via ORAL
  Filled 2014-03-31: qty 2

## 2014-03-31 MED ORDER — SODIUM CHLORIDE 0.9 % IJ SOLN
INTRAMUSCULAR | Status: AC
  Administered 2014-03-31: 23:00:00
  Filled 2014-03-31: qty 250

## 2014-03-31 MED ORDER — SODIUM CHLORIDE 0.9 % IV BOLUS (SEPSIS)
1000.0000 mL | Freq: Once | INTRAVENOUS | Status: AC
  Administered 2014-03-31: 1000 mL via INTRAVENOUS

## 2014-03-31 MED ORDER — SODIUM CHLORIDE 0.9 % IV BOLUS (SEPSIS)
1000.0000 mL | INTRAVENOUS | Status: AC
Start: 2014-03-31 — End: 2014-04-01
  Administered 2014-03-31 – 2014-04-01 (×4): 1000 mL via INTRAVENOUS

## 2014-03-31 NOTE — ED Provider Notes (Addendum)
CSN: [id number]     Arrival date & time 03/31/14  2040 History   First MD Initiated Contact with Patient 03/31/14 2043     Chief Complaint  Patient presents with  . Chest Pain     (Consider location/radiation/quality/duration/timing/severity/associated sxs/prior Treatment) HPI Comments: Patient from home with acute onset of right-sided and central chest pain onset 2 hours ago. Pain is constant, nothing makes it better or worse. Denies any cardiac history. She was given aspirin and nitroglycerin by EMS without relief. Patient is febrile tachycardic. He was seen 3 days ago for flank pain and had a negative CT scan. Urinalysis at that time showed bacteria and leukocyte esterase and hemoglobin. Patient reports history of PE remotely not taking any anticoagulation currently. The pain is sharp, constant and came on with rest. Nothing makes it better or worse. She continues to have right-sided flank pain.  The history is provided by the patient and the EMS personnel. The history is limited by the condition of the patient.    Past Medical History  Diagnosis Date  . Left knee pain   . Migraine   . Cancer     cervical cx at age 31  . Cervical cancer   . Chronic back pain   . Sciatica    Past Surgical History  Procedure Laterality Date  . Tubal ligation    . Cholecystectomy N/A 01/20/2013    Procedure: LAPAROSCOPIC CHOLECYSTECTOMY WITH INTRAOPERATIVE CHOLANGIOGRAM;  Surgeon: Shann Medal, MD;  Location: [facility];  Service: General;  Laterality: N/A;   Family History  Problem Relation Age of Onset  . Cancer Mother   . Colon cancer Mother   . Breast cancer Maternal Grandmother   . Breast cancer Paternal Grandmother    History  Substance Use Topics  . Smoking status: Never Smoker   . Smokeless tobacco: Never Used  . Alcohol Use: Yes   OB History    Gravida Para Term Preterm AB TAB SAB Ectopic Multiple Living   10 7 6 1      7      Review of Systems  Constitutional: Positive for  fever, activity change, appetite change and fatigue.  HENT: Negative for congestion and rhinorrhea.   Eyes: Negative for visual disturbance.  Respiratory: Positive for chest tightness and shortness of breath. Negative for cough.   Cardiovascular: Positive for chest pain.  Gastrointestinal: Negative for vomiting and abdominal pain.  Genitourinary: Positive for dysuria. Negative for hematuria, vaginal bleeding and vaginal discharge.  Musculoskeletal: Negative for myalgias and arthralgias.  Skin: Negative for rash.  Neurological: Negative for dizziness, weakness and headaches.  A complete 10 system review of systems was obtained and all systems are negative except as noted in the HPI and PMH.      Allergies  Benadryl and Onion  Home Medications   Prior to Admission medications   Medication Sig Start Date End Date Taking? Authorizing Provider  aspirin EC 81 MG tablet Take 324 mg by mouth once as needed for moderate pain.   Yes Historical Provider, MD  ibuprofen (ADVIL,MOTRIN) 600 MG tablet Take 1 tablet (600 mg total) by mouth every 6 (six) hours as needed. 02/13/14  Yes [name], MD  methocarbamol (ROBAXIN) 500 MG tablet Take 2 tablets (1,000 mg total) by mouth 4 (four) times daily as needed for muscle spasms (muscle spasm/pain). 03/12/14  Yes Francine Graven, DO  nitroGLYCERIN (NITROSTAT) 0.4 MG SL tablet Place 0.4 mg under the tongue every 5 (five) minutes as needed for  chest pain.   Yes Historical Provider, MD  Ondansetron HCl (ZOFRAN PO) Take 1 tablet by mouth once as needed.   Yes Historical Provider, MD  cyclobenzaprine (FLEXERIL) 10 MG tablet Take 1 tablet (10 mg total) by mouth 2 (two) times daily as needed for muscle spasms. Patient not taking: Reported on 03/12/2014 02/13/14   [name], MD  cyclobenzaprine (FLEXERIL) 10 MG tablet Take 1 tablet (10 mg total) by mouth 2 (two) times daily as needed for muscle spasms. 03/28/14   Fredia Sorrow, MD  HYDROcodone-acetaminophen  (NORCO/VICODIN) 5-325 MG per tablet Take 1-2 tablets by mouth every 6 (six) hours as needed. 03/28/14   Fredia Sorrow, MD  predniSONE (DELTASONE) 10 MG tablet 6,5,4,3,2,1 taper Patient not taking: Reported on 03/12/2014 02/01/14   Hollace Kinnier Sofia, PA-C   BP 98/52 mmHg  Pulse 101  Temp(Src) 99.8 F (37.7 C) (Oral)  Resp 19  Wt 270 lb (122.471 kg)  SpO2 98%  LMP 03/05/2014 Physical Exam  Constitutional: She is oriented to person, place, and time. She appears well-developed and well-nourished. She appears distressed.  Anxious, tachycardic  HENT:  Head: Normocephalic and atraumatic.  Mouth/Throat: Oropharynx is clear and moist. No oropharyngeal exudate.  Eyes: Conjunctivae and EOM are normal. Pupils are equal, round, and reactive to light.  Neck: Normal range of motion. Neck supple.  No meningismus.  Cardiovascular: Normal rate, normal heart sounds and intact distal pulses.   No murmur heard. tachycardic  Pulmonary/Chest: Effort normal and breath sounds normal. No respiratory distress. She exhibits tenderness.  R chest wall tenderness, epigastric tenderness  Abdominal: Soft. There is no tenderness. There is no rebound and no guarding.  Musculoskeletal: Normal range of motion. She exhibits no edema or tenderness.  Neurological: She is alert and oriented to person, place, and time. No cranial nerve deficit. She exhibits normal muscle tone. Coordination normal.  No ataxia on finger to nose bilaterally. No pronator drift. 5/5 strength throughout. CN 2-12 intact. Negative Romberg. Equal grip strength. Sensation intact. Gait is normal.   Skin: Skin is warm.  Psychiatric: She has a normal mood and affect. Her behavior is normal.  Nursing note and vitals reviewed.   ED Course  Procedures (including critical care time) Labs Review Labs Reviewed  COMPREHENSIVE METABOLIC PANEL - Abnormal; Notable for the following:    Potassium 3.2 (*)    Glucose, Bld 138 (*)    Creatinine, Ser 1.58 (*)     Calcium 7.6 (*)    Albumin 2.7 (*)    GFR calc non Af Amer 39 (*)    GFR calc Af Amer 45 (*)    Anion gap 2 (*)    All other components within normal limits  CBC WITH DIFFERENTIAL/PLATELET - Abnormal; Notable for the following:    RBC 3.67 (*)    Hemoglobin 9.7 (*)    HCT 30.0 (*)    Neutrophils Relative % 82 (*)    Lymphocytes Relative 7 (*)    Lymphs Abs 0.6 (*)    All other components within normal limits  URINALYSIS, ROUTINE W REFLEX MICROSCOPIC - Abnormal; Notable for the following:    Hgb urine dipstick SMALL (*)    Protein, ur 30 (*)    Nitrite POSITIVE (*)    Leukocytes, UA MODERATE (*)    All other components within normal limits  D-DIMER, QUANTITATIVE - Abnormal; Notable for the following:    D-Dimer, Quant 1.62 (*)    All other components within normal limits  URINE MICROSCOPIC-ADD  ON - Abnormal; Notable for the following:    Squamous Epithelial / LPF FEW (*)    Bacteria, UA MANY (*)    All other components within normal limits  CULTURE, BLOOD (ROUTINE X 2)  CULTURE, BLOOD (ROUTINE X 2)  URINE CULTURE  TROPONIN I  BRAIN NATRIURETIC PEPTIDE  CBC  I-STAT CG4 LACTIC ACID, ED  I-STAT CG4 LACTIC ACID, ED    Imaging Review Ct Angio Chest Pe W/cm &/or Wo Cm  03/31/2014   CLINICAL DATA:  Right-sided and central sharp constant chest pain starting today. Patient was seen in the ED 3 days ago with right flank pain. Still with right-sided pain.  EXAM: CT ANGIOGRAPHY CHEST WITH CONTRAST  TECHNIQUE: Multidetector CT imaging of the chest was performed using the standard protocol during bolus administration of intravenous contrast. Multiplanar CT image reconstructions and MIPs were obtained to evaluate the vascular anatomy.  CONTRAST:  14mL OMNIPAQUE IOHEXOL 350 MG/ML SOLN  COMPARISON:  CT chest 12/07/2013  FINDINGS: Examination is technically limited due to motion artifact. Moderately good opacification of the central and proximal segmental pulmonary arteries. No focal filling  defects. No evidence of significant central pulmonary embolus. More peripheral vessels are not well visualized.  Normal heart size. Normal caliber thoracic aorta. Accounting for motion artifact, there is no evidence of significant dissection. Great vessel origins are patent. Note of common origin of the carotid arteries. Esophagus is decompressed. No significant lymphadenopathy in the chest.  Evaluation of lungs is limited due to respiratory motion artifact. There is evidence of atelectasis or infiltration in the lung bases. No focal consolidation. No pneumothorax. No pleural effusions. Airways appear patent.  Included portions of the upper abdominal organs are grossly unremarkable. Degenerative changes in the spine. No destructive bone lesions.  Review of the MIP images confirms the above findings.  IMPRESSION: No evidence of significant pulmonary embolus. Atelectasis or infiltration in the lung bases.   Electronically Signed   By: Lucienne Capers M.D.   On: 03/31/2014 23:05   Dg Chest Port 1 View  03/31/2014   CLINICAL DATA:  Chest pain for 2 hours.  Shortness of breath.  EXAM: PORTABLE CHEST - 1 VIEW  COMPARISON:  Frontal and lateral views 03/12/2014  FINDINGS: Very low lung volumes leading to crowding of bronchovascular structures. The cardiomediastinal contours are normal. No consolidation, pleural effusion, or pneumothorax. No acute osseous abnormalities are seen.  IMPRESSION: Hypoventilatory chest without evidence of acute process.   Electronically Signed   By: Jeb Levering M.D.   On: 03/31/2014 21:44     EKG Interpretation   Date/Time:  Friday March 31 2014 20:55:35 EST Ventricular Rate:  129 PR Interval:  124 QRS Duration: 69 QT Interval:  292 QTC Calculation: 428 R Axis:   43 Text Interpretation:  Sinus tachycardia Rate faster Confirmed by Wyvonnia Dusky   MD, Harlyn Rathmann [phone]) on 03/31/2014 9:01:13 PM      MDM   Final diagnoses:  Sepsis due to Escherichia coli  Pyelonephritis    Right-sided chest pain and upper abdominal pain, fever, tachycardia.  Code sepsis activated. Urinalysis from February 23 grew Escherichia coli.  EKG sinus tachycardia.  Lactate normal. Blood cultures and urine cultures obtained. Patient started on Rocephin for positive urine culture from February 23. Chest x-ray negative.  Heart rate and fever improving. Given patient's right-sided chest pain and persistent tachycardia and previous PE, CT angiogram will be obtained. Creatinine elevated but acceptable for IV contrast per radiology.  CT study pending at time of  signout to Dr. Lacinda Axon. If no PE,  patient will need treatment for pyelonephritis and may need inpatient treatment if  if continues to be tachycardic.   CRITICAL CARE Performed by: Ezequiel Essex Total critical care time: 30 Critical care time was exclusive of separately billable procedures and treating other patients. Critical care was necessary to treat or prevent imminent or life-threatening deterioration. Critical care was time spent personally by me on the following activities: development of treatment plan with patient and/or surrogate as well as nursing, discussions with consultants, evaluation of patient's response to treatment, examination of patient, obtaining history from patient or surrogate, ordering and performing treatments and interventions, ordering and review of laboratory studies, ordering and review of radiographic studies, pulse oximetry and re-evaluation of patient's condition.     Ezequiel Essex, MD 03/31/14 [id number]  Ezequiel Essex, MD 03/31/14 [phone]

## 2014-03-31 NOTE — ED Notes (Signed)
Pt. Cultures x2 drawn prior to start of antibiotics.

## 2014-03-31 NOTE — ED Notes (Signed)
Pt to department via EMS.  Pt reporting pain in middle and right side of chest, starting about 2 hours ago.  EMS reports nitro, ASA and zofran given.  Pt was seen and treated 3 days ago for kidney stone on right side. Pt anxious in triage.

## 2014-03-31 NOTE — ED Provider Notes (Signed)
History of physical consistent with pyelonephritis. IV fluids. IV Rocephin. Admit to stepdown.  Nat Christen, MD 03/31/14 [phone]

## 2014-04-01 ENCOUNTER — Encounter (HOSPITAL_COMMUNITY): Payer: Self-pay | Admitting: Internal Medicine

## 2014-04-01 DIAGNOSIS — N183 Chronic kidney disease, stage 3 unspecified: Secondary | ICD-10-CM | POA: Diagnosis present

## 2014-04-01 DIAGNOSIS — A419 Sepsis, unspecified organism: Principal | ICD-10-CM

## 2014-04-01 DIAGNOSIS — R079 Chest pain, unspecified: Secondary | ICD-10-CM | POA: Insufficient documentation

## 2014-04-01 DIAGNOSIS — N12 Tubulo-interstitial nephritis, not specified as acute or chronic: Secondary | ICD-10-CM

## 2014-04-01 DIAGNOSIS — D649 Anemia, unspecified: Secondary | ICD-10-CM | POA: Diagnosis present

## 2014-04-01 DIAGNOSIS — I348 Other nonrheumatic mitral valve disorders: Secondary | ICD-10-CM

## 2014-04-01 LAB — CBC WITH DIFFERENTIAL/PLATELET
BASOS ABS: 0 10*3/uL (ref 0.0–0.1)
Basophils Relative: 0 % (ref 0–1)
Eosinophils Absolute: 0 10*3/uL (ref 0.0–0.7)
Eosinophils Relative: 0 % (ref 0–5)
HEMATOCRIT: 27.5 % — AB (ref 36.0–46.0)
Hemoglobin: 8.8 g/dL — ABNORMAL LOW (ref 12.0–15.0)
LYMPHS ABS: 0.7 10*3/uL (ref 0.7–4.0)
Lymphocytes Relative: 10 % — ABNORMAL LOW (ref 12–46)
MCH: 26.9 pg (ref 26.0–34.0)
MCHC: 32 g/dL (ref 30.0–36.0)
MCV: 84.1 fL (ref 78.0–100.0)
MONOS PCT: 13 % — AB (ref 3–12)
Monocytes Absolute: 0.9 10*3/uL (ref 0.1–1.0)
NEUTROS ABS: 5.4 10*3/uL (ref 1.7–7.7)
NEUTROS PCT: 77 % (ref 43–77)
Platelets: 199 10*3/uL (ref 150–400)
RBC: 3.27 MIL/uL — ABNORMAL LOW (ref 3.87–5.11)
RDW: 15.7 % — ABNORMAL HIGH (ref 11.5–15.5)
WBC: 7.1 10*3/uL (ref 4.0–10.5)

## 2014-04-01 LAB — MRSA PCR SCREENING: MRSA by PCR: NEGATIVE

## 2014-04-01 LAB — CBC
HCT: 27.2 % — ABNORMAL LOW (ref 36.0–46.0)
Hemoglobin: 8.6 g/dL — ABNORMAL LOW (ref 12.0–15.0)
MCH: 26.4 pg (ref 26.0–34.0)
MCHC: 31.6 g/dL (ref 30.0–36.0)
MCV: 83.4 fL (ref 78.0–100.0)
PLATELETS: 212 10*3/uL (ref 150–400)
RBC: 3.26 MIL/uL — ABNORMAL LOW (ref 3.87–5.11)
RDW: 15.6 % — AB (ref 11.5–15.5)
WBC: 6.9 10*3/uL (ref 4.0–10.5)

## 2014-04-01 LAB — COMPREHENSIVE METABOLIC PANEL
ALT: 42 U/L — ABNORMAL HIGH (ref 0–35)
AST: 64 U/L — ABNORMAL HIGH (ref 0–37)
Albumin: 2.2 g/dL — ABNORMAL LOW (ref 3.5–5.2)
Alkaline Phosphatase: 120 U/L — ABNORMAL HIGH (ref 39–117)
Anion gap: 3 — ABNORMAL LOW (ref 5–15)
BILIRUBIN TOTAL: 0.7 mg/dL (ref 0.3–1.2)
BUN: 8 mg/dL (ref 6–23)
CALCIUM: 6.7 mg/dL — AB (ref 8.4–10.5)
CO2: 22 mmol/L (ref 19–32)
CREATININE: 1.23 mg/dL — AB (ref 0.50–1.10)
Chloride: 113 mmol/L — ABNORMAL HIGH (ref 96–112)
GFR calc Af Amer: 60 mL/min — ABNORMAL LOW (ref >90)
GFR calc non Af Amer: 52 mL/min — ABNORMAL LOW (ref >90)
GLUCOSE: 131 mg/dL — AB (ref 70–99)
POTASSIUM: 3.8 mmol/L (ref 3.5–5.1)
Sodium: 138 mmol/L (ref 135–145)
Total Protein: 5.7 g/dL — ABNORMAL LOW (ref 6.0–8.3)

## 2014-04-01 LAB — TROPONIN I

## 2014-04-01 LAB — TSH: TSH: 3.156 u[IU]/mL (ref 0.350–4.500)

## 2014-04-01 LAB — PROCALCITONIN: PROCALCITONIN: 3.25 ng/mL

## 2014-04-01 LAB — CK: Total CK: 40 U/L (ref 7–177)

## 2014-04-01 LAB — LACTIC ACID, PLASMA: LACTIC ACID, VENOUS: 1 mmol/L (ref 0.5–2.0)

## 2014-04-01 LAB — RETICULOCYTES
RBC.: 3.26 MIL/uL — ABNORMAL LOW (ref 3.87–5.11)
RETIC CT PCT: 1 % (ref 0.4–3.1)
Retic Count, Absolute: 32.6 10*3/uL (ref 19.0–186.0)

## 2014-04-01 LAB — LIPASE, BLOOD: Lipase: 17 U/L (ref 11–59)

## 2014-04-01 LAB — PREGNANCY, URINE: Preg Test, Ur: NEGATIVE

## 2014-04-01 LAB — CREATININE, SERUM
CREATININE: 1.37 mg/dL — AB (ref 0.50–1.10)
GFR calc non Af Amer: 46 mL/min — ABNORMAL LOW (ref >90)
GFR, EST AFRICAN AMERICAN: 53 mL/min — AB (ref >90)

## 2014-04-01 MED ORDER — SODIUM CHLORIDE 0.9 % IV BOLUS (SEPSIS)
1000.0000 mL | Freq: Once | INTRAVENOUS | Status: AC
  Administered 2014-04-01: 1000 mL via INTRAVENOUS

## 2014-04-01 MED ORDER — DEXAMETHASONE SODIUM PHOSPHATE 4 MG/ML IJ SOLN
8.0000 mg | Freq: Once | INTRAMUSCULAR | Status: AC
  Administered 2014-04-01: 8 mg via INTRAVENOUS
  Filled 2014-04-01: qty 2

## 2014-04-01 MED ORDER — OXYCODONE HCL 5 MG PO TABS
5.0000 mg | ORAL_TABLET | ORAL | Status: DC | PRN
  Administered 2014-04-01 – 2014-04-03 (×9): 5 mg via ORAL
  Filled 2014-04-01 (×9): qty 1

## 2014-04-01 MED ORDER — MORPHINE SULFATE 2 MG/ML IJ SOLN
1.0000 mg | INTRAMUSCULAR | Status: DC | PRN
  Administered 2014-04-01: 1 mg via INTRAVENOUS
  Filled 2014-04-01: qty 1

## 2014-04-01 MED ORDER — ONDANSETRON HCL 4 MG/2ML IJ SOLN
4.0000 mg | Freq: Four times a day (QID) | INTRAMUSCULAR | Status: DC | PRN
  Administered 2014-04-02: 4 mg via INTRAVENOUS
  Filled 2014-04-01 (×2): qty 2

## 2014-04-01 MED ORDER — ENOXAPARIN SODIUM 40 MG/0.4ML ~~LOC~~ SOLN
40.0000 mg | SUBCUTANEOUS | Status: DC
  Administered 2014-04-01 – 2014-04-03 (×3): 40 mg via SUBCUTANEOUS
  Filled 2014-04-01 (×3): qty 0.4

## 2014-04-01 MED ORDER — ONDANSETRON HCL 4 MG PO TABS
4.0000 mg | ORAL_TABLET | Freq: Four times a day (QID) | ORAL | Status: DC | PRN
  Administered 2014-04-03: 4 mg via ORAL
  Filled 2014-04-01: qty 1

## 2014-04-01 MED ORDER — SODIUM CHLORIDE 0.9 % IV SOLN
INTRAVENOUS | Status: AC
  Administered 2014-04-01: 02:00:00 via INTRAVENOUS

## 2014-04-01 MED ORDER — ACETAMINOPHEN 325 MG PO TABS: 650.0000 mg | ORAL_TABLET | Freq: Four times a day (QID) | ORAL | Status: DC | PRN

## 2014-04-01 MED ORDER — SODIUM CHLORIDE 0.9 % IV SOLN
INTRAVENOUS | Status: AC
  Administered 2014-04-01 (×3): via INTRAVENOUS

## 2014-04-01 MED ORDER — ACETAMINOPHEN 650 MG RE SUPP: 650.0000 mg | Freq: Four times a day (QID) | RECTAL | Status: DC | PRN

## 2014-04-01 MED ORDER — CEFTRIAXONE SODIUM IN DEXTROSE 20 MG/ML IV SOLN
1.0000 g | INTRAVENOUS | Status: DC
  Administered 2014-04-01 – 2014-04-02 (×2): 1 g via INTRAVENOUS
  Filled 2014-04-01 (×3): qty 50

## 2014-04-01 NOTE — ED Notes (Signed)
Pt. Given meal 

## 2014-04-01 NOTE — Progress Notes (Signed)
Patient seen and examined. Admitted earlier today for sepsis due to pyelonephritis. Continue rocephin pending cx data. Remains hypotensive this am; will keep in SDU and continue IVF. The CP that she is describing appears to be more upper quadrant/flank pain due do her pyelonephritis. Will continue to follow.  Domingo Mend, MD [facility] Pager: [phone]

## 2014-04-01 NOTE — H&P (Signed)
[facility] History and Physical  Kristi Martin RXV:[id number] DOB: [birth year] DOA: 03/31/2014  Referring physician: ER physician. PCP: No PCP Per Patient   Chief Complaint: Chest pain and weakness.  HPI: Kristi Martin is a 44 y.o. female history of chronic anemia and probably chronic kidney disease stage III presents to the ER because of chest pain and bilateral flank pain with weakness nausea vomiting and loose stools. Patient had come 2 days ago for bilateral flank pain and at that time CT renal study was unremarkable. Patient was discharged home on pain relief medication and muscle relaxant. Patient calls back and at this time on exam patient still has bilateral flank tenderness along with chest pain. Chest pain is retrosternal stabbing in nature increased on deep inspiration. No addition patient has been having multiple episodes of nausea and vomiting. Patient states she also has benign some loose stools which is greenish in color. CT abdomen or the chest was negative for PE. EKG was showing sinus tachycardia with troponins negative. Patient's urine cultures done 2 days ago shows Escherichia coli. Patient was found to be hypotensive with tachycardia. Patient has been admitted for pyelonephritis with possible developing sepsis. Patient also was on steroid taper dose 3 weeks ago for low back pain.   Review of Systems: As presented in the history of presenting illness, rest negative.  Past Medical History  Diagnosis Date  . Left knee pain   . Migraine   . Cancer     cervical cx at age 89  . Cervical cancer   . Chronic back pain   . Sciatica    Past Surgical History  Procedure Laterality Date  . Tubal ligation    . Cholecystectomy N/A 01/20/2013    Procedure: LAPAROSCOPIC CHOLECYSTECTOMY WITH INTRAOPERATIVE CHOLANGIOGRAM;  Surgeon: Shann Medal, MD;  Location: [facility];  Service: General;  Laterality: N/A;   Social History:  reports that she has never smoked. She has never  used smokeless tobacco. She reports that she drinks alcohol. She reports that she does not use illicit drugs. Where does patient live home. Can patient participate in ADLs? Yes.  Allergies  Allergen Reactions  . Benadryl [Diphenhydramine Hcl] Anaphylaxis and Swelling    Back of throat closes  . Onion Anaphylaxis    Family History:  Family History  Problem Relation Age of Onset  . Cancer Mother   . Colon cancer Mother   . Breast cancer Maternal Grandmother   . Breast cancer Paternal Grandmother       Prior to Admission medications   Medication Sig Start Date End Date Taking? Authorizing Provider  aspirin EC 81 MG tablet Take 324 mg by mouth once as needed for moderate pain.   Yes Historical Provider, MD  ibuprofen (ADVIL,MOTRIN) 600 MG tablet Take 1 tablet (600 mg total) by mouth every 6 (six) hours as needed. 02/13/14  Yes [name], MD  methocarbamol (ROBAXIN) 500 MG tablet Take 2 tablets (1,000 mg total) by mouth 4 (four) times daily as needed for muscle spasms (muscle spasm/pain). 03/12/14  Yes Francine Graven, DO  nitroGLYCERIN (NITROSTAT) 0.4 MG SL tablet Place 0.4 mg under the tongue every 5 (five) minutes as needed for chest pain.   Yes Historical Provider, MD  Ondansetron HCl (ZOFRAN PO) Take 1 tablet by mouth once as needed.   Yes Historical Provider, MD  cyclobenzaprine (FLEXERIL) 10 MG tablet Take 1 tablet (10 mg total) by mouth 2 (two) times daily as needed for muscle spasms. Patient not  taking: Reported on 03/12/2014 02/13/14   [name], MD  cyclobenzaprine (FLEXERIL) 10 MG tablet Take 1 tablet (10 mg total) by mouth 2 (two) times daily as needed for muscle spasms. 03/28/14   Fredia Sorrow, MD  HYDROcodone-acetaminophen (NORCO/VICODIN) 5-325 MG per tablet Take 1-2 tablets by mouth every 6 (six) hours as needed. 03/28/14   Fredia Sorrow, MD  predniSONE (DELTASONE) 10 MG tablet 6,5,4,3,2,1 taper Patient not taking: Reported on 03/12/2014 02/01/14   Fransico Meadow, [place]     Physical Exam: Filed Vitals:   03/31/14 2315 03/31/14 2330 03/31/14 2345 04/01/14 0000  BP: 98/52 87/57 100/56 98/66  Pulse: 101 99 100 94  Temp:    98.5 F (36.9 C)  TempSrc:    Oral  Resp: 19 14 16 25   Height:    5\' 5"  (1.651 m)  Weight:    124.6 kg (274 lb 11.1 oz)  SpO2: 98% 100% 98% 99%     General:  Obese no distress.  Eyes: Anicteric no pallor.  ENT: No discharge from the ears eyes nose or mouth.  Neck: No mass felt.  Cardiovascular: S1-S2 heard.  Respiratory: No rhonchi or crepitations.  Abdomen: Soft bilateral flank tenderness. No guarding or rigidity.  Skin: No rash.  Musculoskeletal: No edema.  Psychiatric: Appears normal.  Neurologic: Alert awake oriented to time place and person. Moves all extremities.  Labs on Admission:  Basic Metabolic Panel:  Recent Labs Lab 03/28/14 1920 03/31/14 2050  NA 140 135  K 3.4* 3.2*  CL 111 111  CO2 25 22  GLUCOSE 140* 138*  BUN 14 11  CREATININE 1.41* 1.58*  CALCIUM 8.2* 7.6*   Liver Function Tests:  Recent Labs Lab 03/31/14 2050  AST 30  ALT 23  ALKPHOS 95  BILITOT 0.7  PROT 6.4  ALBUMIN 2.7*   No results for input(s): LIPASE, AMYLASE in the last 168 hours. No results for input(s): AMMONIA in the last 168 hours. CBC:  Recent Labs Lab 03/28/14 1920 03/31/14 2050  WBC 10.1 9.1  NEUTROABS 8.4* 7.4  HGB 10.2* 9.7*  HCT 32.4* 30.0*  MCV 84.6 81.7  PLT 211 223   Cardiac Enzymes:  Recent Labs Lab 03/31/14 2050  TROPONINI <0.03    BNP (last 3 results)  Recent Labs  03/31/14 2050  BNP 35.0    ProBNP (last 3 results)  Recent Labs  01/17/14 1215  PROBNP 16.5    CBG: No results for input(s): GLUCAP in the last 168 hours.  Radiological Exams on Admission: Ct Angio Chest Pe W/cm &/or Wo Cm  03/31/2014   CLINICAL DATA:  Right-sided and central sharp constant chest pain starting today. Patient was seen in the ED 3 days ago with right flank pain. Still with right-sided  pain.  EXAM: CT ANGIOGRAPHY CHEST WITH CONTRAST  TECHNIQUE: Multidetector CT imaging of the chest was performed using the standard protocol during bolus administration of intravenous contrast. Multiplanar CT image reconstructions and MIPs were obtained to evaluate the vascular anatomy.  CONTRAST:  161mL OMNIPAQUE IOHEXOL 350 MG/ML SOLN  COMPARISON:  CT chest 12/07/2013  FINDINGS: Examination is technically limited due to motion artifact. Moderately good opacification of the central and proximal segmental pulmonary arteries. No focal filling defects. No evidence of significant central pulmonary embolus. More peripheral vessels are not well visualized.  Normal heart size. Normal caliber thoracic aorta. Accounting for motion artifact, there is no evidence of significant dissection. Great vessel origins are patent. Note of common origin of the carotid  arteries. Esophagus is decompressed. No significant lymphadenopathy in the chest.  Evaluation of lungs is limited due to respiratory motion artifact. There is evidence of atelectasis or infiltration in the lung bases. No focal consolidation. No pneumothorax. No pleural effusions. Airways appear patent.  Included portions of the upper abdominal organs are grossly unremarkable. Degenerative changes in the spine. No destructive bone lesions.  Review of the MIP images confirms the above findings.  IMPRESSION: No evidence of significant pulmonary embolus. Atelectasis or infiltration in the lung bases.   Electronically Signed   By: Lucienne Capers M.D.   On: 03/31/2014 23:05   Dg Chest Port 1 View  03/31/2014   CLINICAL DATA:  Chest pain for 2 hours.  Shortness of breath.  EXAM: PORTABLE CHEST - 1 VIEW  COMPARISON:  Frontal and lateral views 03/12/2014  FINDINGS: Very low lung volumes leading to crowding of bronchovascular structures. The cardiomediastinal contours are normal. No consolidation, pleural effusion, or pneumothorax. No acute osseous abnormalities are seen.   IMPRESSION: Hypoventilatory chest without evidence of acute process.   Electronically Signed   By: Jeb Levering M.D.   On: 03/31/2014 21:44    EKG: Independently reviewed. Sinus tachycardia.  Assessment/Plan Principal Problem:   Pyelonephritis Active Problems:   Chronic anemia   CKD (chronic kidney disease) stage 3, GFR 30-59 ml/min   Sepsis   Chest pain   1. Pyelonephritis with sepsis - urine cultures done 2 days ago showed Escherichia coli. Patient has been placed on ceftriaxone for pyelonephritis. Follow blood cultures and urine cultures. Continue with hydration. Patient did receive 3 L normal C bolus in the ER. Recheck lactic acid and also we will check in addition lipase levels and stool studies since patient also is complaining of loose stools. His abdominal pain persists may consider further imaging. Patient has had previous cholecystectomy. 2. Chest pain - appears atypical. Cycle cardiac markers. Maybe from nausea vomiting. Check 2-D echo. CT and exam was unremarkable. 3. Chronic anemia normocytic normochromic - check anemia panel. Follow CBC. 4. Chronic kidney disease stage III - reviewing patient's labs patient has had chronic kidney disease. Follow metabolic panel closely. 5. Chronic back pain - if patient pain persists patient eventually will need MRI of the spine to further assess her pain.  Since patient has been recently on prednisone I have ordered a cortisol level.  DVT Prophylaxis Lovenox.  Code Status: Full code.  Family Communication: Patient's mother at the bedside.  Disposition Plan: Admit to inpatient.    Christabell Loseke N. [facility] Pager [phone].  If 7PM-7AM, please contact night-coverage [email or web address] Password [email or web address] 04/01/2014, 1:29 AM

## 2014-04-01 NOTE — Progress Notes (Signed)
ANTIBIOTIC CONSULT NOTE - INITIAL  Pharmacy Consult for Rocephin Indication: pyelonephritis  Allergies  Allergen Reactions  . Benadryl [Diphenhydramine Hcl] Anaphylaxis and Swelling    Back of throat closes  . Onion Anaphylaxis   Patient Measurements: Height: 5\' 5"  (165.1 cm) Weight: 273 lb 5.9 oz (124 kg) IBW/kg (Calculated) : 57  Vital Signs: Temp: 97.8 F (36.6 C) (02/27 0400) Temp Source: Oral (02/27 0400) BP: 94/62 mmHg (02/27 0700) Pulse Rate: 73 (02/27 0700) Intake/Output from previous day: 02/26 0701 - 02/27 0700 In: 7325 [P.O.:600; I.V.:675; IV RJJOACZYS:0630] Out: 1000 [Urine:1000] Intake/Output from this shift: Total I/O In: -  Out: 325 [Urine:325]  Labs:  Recent Labs  03/31/14 2050 04/01/14 0217 04/01/14 0547  WBC 9.1 6.9 7.1  HGB 9.7* 8.6* 8.8*  PLT 223 212 199  CREATININE 1.58* 1.37* 1.23*   Estimated Creatinine Clearance: 76.4 mL/min (by C-G formula based on Cr of 1.23). No results for input(s): VANCOTROUGH, VANCOPEAK, VANCORANDOM, GENTTROUGH, GENTPEAK, GENTRANDOM, TOBRATROUGH, TOBRAPEAK, TOBRARND, AMIKACINPEAK, AMIKACINTROU, AMIKACIN in the last 72 hours.   Microbiology: Recent Results (from the past 720 hour(s))  Urine culture     Status: None   Collection Time: 03/28/14  9:20 PM  Result Value Ref Range Status   Specimen Description URINE, CLEAN CATCH  Final   Special Requests NONE  Final   Colony Count   Final    >=100,000 COLONIES/ML Performed at [facility]    Culture   Final    ESCHERICHIA COLI Performed at [facility]    Report Status 03/31/2014 FINAL  Final   Organism ID, Bacteria ESCHERICHIA COLI  Final      Susceptibility   Escherichia coli - MIC*    AMPICILLIN >=32 RESISTANT Resistant     CEFAZOLIN <=4 SENSITIVE Sensitive     CEFTRIAXONE <=1 SENSITIVE Sensitive     CIPROFLOXACIN <=0.25 SENSITIVE Sensitive     GENTAMICIN <=1 SENSITIVE Sensitive     LEVOFLOXACIN <=0.12 SENSITIVE Sensitive    NITROFURANTOIN <=16 SENSITIVE Sensitive     TOBRAMYCIN <=1 SENSITIVE Sensitive     TRIMETH/SULFA >=320 RESISTANT Resistant     PIP/TAZO <=4 SENSITIVE Sensitive     * ESCHERICHIA COLI  Blood culture (routine x 2)     Status: None (Preliminary result)   Collection Time: 03/31/14  8:50 PM  Result Value Ref Range Status   Specimen Description BLOOD LEFT HAND  Final   Special Requests BOTTLES DRAWN AEROBIC AND ANAEROBIC 6CC  Final   Culture NO GROWTH 1 DAY  Final   Report Status PENDING  Incomplete  Blood culture (routine x 2)     Status: None (Preliminary result)   Collection Time: 03/31/14  9:03 PM  Result Value Ref Range Status   Specimen Description RIGHT ANTECUBITAL  Final   Special Requests BOTTLES DRAWN AEROBIC AND ANAEROBIC 5CC  Final   Culture NO GROWTH 1 DAY  Final   Report Status PENDING  Incomplete  MRSA PCR Screening     Status: None   Collection Time: 04/01/14 12:36 AM  Result Value Ref Range Status   MRSA by PCR NEGATIVE NEGATIVE Final    Comment:        The GeneXpert MRSA Assay (FDA approved for NASAL specimens only), is one component of a comprehensive MRSA colonization surveillance program. It is not intended to diagnose MRSA infection nor to guide or monitor treatment for MRSA infections.    Medical History: Past Medical History  Diagnosis Date  . Left knee  pain   . Migraine   . Cancer     cervical cx at age 48  . Cervical cancer   . Chronic back pain   . Sciatica    Anti-infectives    Start     Dose/Rate Route Frequency Ordered Stop   04/01/14 1800  cefTRIAXone (ROCEPHIN) 1 g in dextrose 5 % 50 mL IVPB - Premix     1 g 100 mL/hr over 30 Minutes Intravenous Every 24 hours 04/01/14 0819     03/31/14 2045  cefTRIAXone (ROCEPHIN) 1 g in dextrose 5 % 50 mL IVPB     1 g 100 mL/hr over 30 Minutes Intravenous  Once 03/31/14 2042 03/31/14 2141     Assessment: 44yo female with h/o CKD stage III with c/o bilateral flank pain and n/v.  Asked to initiate  Rocephin for pyelonephritis.  Pt received initial dose on admission.  Goal of Therapy:  Eradicate infection.  Plan:  Rocephin 1gm IV q24hrs Monitor labs, progress, and cultures  [place] Crane, Octave [place]rose A 04/01/2014,8:19 AM

## 2014-04-01 NOTE — Progress Notes (Signed)
  Echocardiogram 2D Echocardiogram has been performed.  Kristi Martin 04/01/2014, 9:52 AM

## 2014-04-02 DIAGNOSIS — D649 Anemia, unspecified: Secondary | ICD-10-CM

## 2014-04-02 LAB — CBC
HEMATOCRIT: 29.1 % — AB (ref 36.0–46.0)
Hemoglobin: 9.2 g/dL — ABNORMAL LOW (ref 12.0–15.0)
MCH: 26.5 pg (ref 26.0–34.0)
MCHC: 31.6 g/dL (ref 30.0–36.0)
MCV: 83.9 fL (ref 78.0–100.0)
PLATELETS: 133 10*3/uL — AB (ref 150–400)
RBC: 3.47 MIL/uL — ABNORMAL LOW (ref 3.87–5.11)
RDW: 15.8 % — ABNORMAL HIGH (ref 11.5–15.5)
WBC: 11.4 10*3/uL — ABNORMAL HIGH (ref 4.0–10.5)

## 2014-04-02 LAB — BASIC METABOLIC PANEL
Anion gap: 7 (ref 5–15)
BUN: 11 mg/dL (ref 6–23)
CO2: 18 mmol/L — ABNORMAL LOW (ref 19–32)
CREATININE: 0.99 mg/dL (ref 0.50–1.10)
Calcium: 7.6 mg/dL — ABNORMAL LOW (ref 8.4–10.5)
Chloride: 115 mmol/L — ABNORMAL HIGH (ref 96–112)
GFR calc Af Amer: 79 mL/min — ABNORMAL LOW (ref >90)
GFR calc non Af Amer: 68 mL/min — ABNORMAL LOW (ref >90)
Glucose, Bld: 105 mg/dL — ABNORMAL HIGH (ref 70–99)
Potassium: 5.4 mmol/L — ABNORMAL HIGH (ref 3.5–5.1)
Sodium: 140 mmol/L (ref 135–145)

## 2014-04-02 LAB — VITAMIN B12: VITAMIN B 12: 238 pg/mL (ref 211–911)

## 2014-04-02 LAB — CORTISOL: CORTISOL PLASMA: 11.8 ug/dL

## 2014-04-02 LAB — IRON AND TIBC
Iron: <10 ug/dL — ABNORMAL LOW (ref 42–145)
UIBC: 221 ug/dL (ref 125–400)

## 2014-04-02 LAB — FERRITIN: FERRITIN: 89 ng/mL (ref 10–291)

## 2014-04-02 LAB — FOLATE: FOLATE: 6.7 ng/mL

## 2014-04-02 MED ORDER — DEXTROSE 5 % IV SOLN
INTRAVENOUS | Status: AC
  Filled 2014-04-02: qty 10

## 2014-04-02 NOTE — Progress Notes (Signed)
[facility] PROGRESS NOTE  Kristi BACIGALUPO ENI:[id number] DOB: 12/17/1970 DOA: 03/31/2014 PCP: No PCP Per Patient  Assessment/Plan: Severe Sepsis with Shock -2/2 pyelonephritis. -Sepsis parameters improved. -Will transfer to floor today. -Procalcitonin improving. -See below for details.  Pyelonephritis -Continue rocephin pending cx data.  CKD Stage III -Baseline Cr around 1.28. -Will need OP renal follow up. -Cr improving to 0.99 today.  AOCD -2/2 renal failure. -hb stable around 8.6-9.0.  Code Status: Full Code Family Communication: Mother  Disposition Plan: Transfer to floor. Hope for DC home next 24-48 hours.   Consultants:  None   Antibiotics:  Rocephin   Subjective: Feels better. Still with right flank pain.  Objective: Filed Vitals:   04/02/14 1000 04/02/14 1100 04/02/14 1200 04/02/14 1212  BP: 135/84 107/61 143/90   Pulse: 91 90 88   Temp:    97.7 F (36.5 C)  TempSrc:    Axillary  Resp: 17 23 18    Height:      Weight:      SpO2: 99% 99% 98%     Intake/Output Summary (Last 24 hours) at 04/02/14 1811 Last data filed at 04/02/14 1300  Gross per 24 hour  Intake 4173.67 ml  Output   1625 ml  Net 2548.67 ml   Filed Weights   04/01/14 0000 04/01/14 0400 04/02/14 0500  Weight: 124.6 kg (274 lb 11.1 oz) 124 kg (273 lb 5.9 oz) 128.7 kg (283 lb 11.7 oz)    Exam:   General:  AA Ox3  Cardiovascular: RRR  Respiratory: CTA B  Abdomen: S/NT/ND/+BS  Extremities: no C/C/E  Neurologic:  nonfocal  Data Reviewed: Basic Metabolic Panel:  Recent Labs Lab 03/28/14 1920 03/31/14 2050 04/01/14 0217 04/01/14 0547 04/02/14 0513  NA 140 135  --  138 140  K 3.4* 3.2*  --  3.8 5.4*  CL 111 111  --  113* 115*  CO2 25 22  --  22 18*  GLUCOSE 140* 138*  --  131* 105*  BUN 14 11  --  8 11  CREATININE 1.41* 1.58* 1.37* 1.23* 0.99  CALCIUM 8.2* 7.6*  --  6.7* 7.6*   Liver Function Tests:  Recent Labs Lab 03/31/14 2050  04/01/14 0547  AST 30 64*  ALT 23 42*  ALKPHOS 95 120*  BILITOT 0.7 0.7  PROT 6.4 5.7*  ALBUMIN 2.7* 2.2*    Recent Labs Lab 04/01/14 0217  LIPASE 17   No results for input(s): AMMONIA in the last 168 hours. CBC:  Recent Labs Lab 03/28/14 1920 03/31/14 2050 04/01/14 0217 04/01/14 0547 04/02/14 0513  WBC 10.1 9.1 6.9 7.1 11.4*  NEUTROABS 8.4* 7.4  --  5.4  --   HGB 10.2* 9.7* 8.6* 8.8* 9.2*  HCT 32.4* 30.0* 27.2* 27.5* 29.1*  MCV 84.6 81.7 83.4 84.1 83.9  PLT 211 223 212 199 133*   Cardiac Enzymes:  Recent Labs Lab 03/31/14 2050 04/01/14 0217  CKTOTAL  --  40  TROPONINI <0.03 <0.03   BNP (last 3 results)  Recent Labs  03/31/14 2050  BNP 35.0    ProBNP (last 3 results)  Recent Labs  01/17/14 1215  PROBNP 16.5    CBG: No results for input(s): GLUCAP in the last 168 hours.  Recent Results (from the past 240 hour(s))  Urine culture     Status: None   Collection Time: 03/28/14  9:20 PM  Result Value Ref Range Status   Specimen Description URINE, CLEAN CATCH  Final  Special Requests NONE  Final   Colony Count   Final    >=100,000 COLONIES/ML Performed at [facility]    Culture   Final    ESCHERICHIA COLI Performed at [facility]    Report Status 03/31/2014 FINAL  Final   Organism ID, Bacteria ESCHERICHIA COLI  Final      Susceptibility   Escherichia coli - MIC*    AMPICILLIN >=32 RESISTANT Resistant     CEFAZOLIN <=4 SENSITIVE Sensitive     CEFTRIAXONE <=1 SENSITIVE Sensitive     CIPROFLOXACIN <=0.25 SENSITIVE Sensitive     GENTAMICIN <=1 SENSITIVE Sensitive     LEVOFLOXACIN <=0.12 SENSITIVE Sensitive     NITROFURANTOIN <=16 SENSITIVE Sensitive     TOBRAMYCIN <=1 SENSITIVE Sensitive     TRIMETH/SULFA >=320 RESISTANT Resistant     PIP/TAZO <=4 SENSITIVE Sensitive     * ESCHERICHIA COLI  Blood culture (routine x 2)     Status: None (Preliminary result)   Collection Time: 03/31/14  8:50 PM  Result Value Ref Range  Status   Specimen Description BLOOD LEFT HAND  Final   Special Requests BOTTLES DRAWN AEROBIC AND ANAEROBIC 6CC  Final   Culture NO GROWTH 2 DAYS  Final   Report Status PENDING  Incomplete  Blood culture (routine x 2)     Status: None (Preliminary result)   Collection Time: 03/31/14  9:03 PM  Result Value Ref Range Status   Specimen Description RIGHT ANTECUBITAL  Final   Special Requests BOTTLES DRAWN AEROBIC AND ANAEROBIC 5CC  Final   Culture NO GROWTH 2 DAYS  Final   Report Status PENDING  Incomplete  MRSA PCR Screening     Status: None   Collection Time: 04/01/14 12:36 AM  Result Value Ref Range Status   MRSA by PCR NEGATIVE NEGATIVE Final    Comment:        The GeneXpert MRSA Assay (FDA approved for NASAL specimens only), is one component of a comprehensive MRSA colonization surveillance program. It is not intended to diagnose MRSA infection nor to guide or monitor treatment for MRSA infections.      Studies: Ct Angio Chest Pe W/cm &/or Wo Cm  03/31/2014   CLINICAL DATA:  Right-sided and central sharp constant chest pain starting today. Patient was seen in the ED 3 days ago with right flank pain. Still with right-sided pain.  EXAM: CT ANGIOGRAPHY CHEST WITH CONTRAST  TECHNIQUE: Multidetector CT imaging of the chest was performed using the standard protocol during bolus administration of intravenous contrast. Multiplanar CT image reconstructions and MIPs were obtained to evaluate the vascular anatomy.  CONTRAST:  136mL OMNIPAQUE IOHEXOL 350 MG/ML SOLN  COMPARISON:  CT chest 12/07/2013  FINDINGS: Examination is technically limited due to motion artifact. Moderately good opacification of the central and proximal segmental pulmonary arteries. No focal filling defects. No evidence of significant central pulmonary embolus. More peripheral vessels are not well visualized.  Normal heart size. Normal caliber thoracic aorta. Accounting for motion artifact, there is no evidence of significant  dissection. Great vessel origins are patent. Note of common origin of the carotid arteries. Esophagus is decompressed. No significant lymphadenopathy in the chest.  Evaluation of lungs is limited due to respiratory motion artifact. There is evidence of atelectasis or infiltration in the lung bases. No focal consolidation. No pneumothorax. No pleural effusions. Airways appear patent.  Included portions of the upper abdominal organs are grossly unremarkable. Degenerative changes in the spine. No destructive bone lesions.  Review of the MIP images confirms the above findings.  IMPRESSION: No evidence of significant pulmonary embolus. Atelectasis or infiltration in the lung bases.   Electronically Signed   By: Lucienne Capers M.D.   On: 03/31/2014 23:05   Dg Chest Port 1 View  03/31/2014   CLINICAL DATA:  Chest pain for 2 hours.  Shortness of breath.  EXAM: PORTABLE CHEST - 1 VIEW  COMPARISON:  Frontal and lateral views 03/12/2014  FINDINGS: Very low lung volumes leading to crowding of bronchovascular structures. The cardiomediastinal contours are normal. No consolidation, pleural effusion, or pneumothorax. No acute osseous abnormalities are seen.  IMPRESSION: Hypoventilatory chest without evidence of acute process.   Electronically Signed   By: Jeb Levering M.D.   On: 03/31/2014 21:44    Scheduled Meds: . cefTRIAXone (ROCEPHIN)  IV  1 g Intravenous Q24H  . enoxaparin (LOVENOX) injection  40 mg Subcutaneous Q24H   Continuous Infusions:   Principal Problem:   Pyelonephritis Active Problems:   Chronic anemia   CKD (chronic kidney disease) stage 3, GFR 30-59 ml/min   Sepsis   Chest pain    Time spent: 35 minutes. Greater than 50% of this time was spent in direct contact with the patient coordinating care.    Lelon Frohlich  [facility] Pager [phone]  If 7PM-7AM, please contact night-coverage at [email or web address], password [email or web address] 04/02/2014, 6:11 PM  LOS: 2 days

## 2014-04-02 NOTE — Progress Notes (Signed)
Utilization review Completed Tyrae Alcoser RN BSN   

## 2014-04-02 NOTE — Progress Notes (Signed)
Called report to 300 RN and gave all information. Patient did ask that I take out IV in left arm, Tolerated well.

## 2014-04-03 DIAGNOSIS — R7302 Impaired glucose tolerance (oral): Secondary | ICD-10-CM

## 2014-04-03 LAB — CBC
HCT: 28.1 % — ABNORMAL LOW (ref 36.0–46.0)
Hemoglobin: 9 g/dL — ABNORMAL LOW (ref 12.0–15.0)
MCH: 26.5 pg (ref 26.0–34.0)
MCHC: 32 g/dL (ref 30.0–36.0)
MCV: 82.6 fL (ref 78.0–100.0)
Platelets: 338 10*3/uL (ref 150–400)
RBC: 3.4 MIL/uL — AB (ref 3.87–5.11)
RDW: 15.7 % — ABNORMAL HIGH (ref 11.5–15.5)
WBC: 6.3 10*3/uL (ref 4.0–10.5)

## 2014-04-03 LAB — BASIC METABOLIC PANEL
ANION GAP: 5 (ref 5–15)
BUN: 7 mg/dL (ref 6–23)
CO2: 28 mmol/L (ref 19–32)
CREATININE: 1.27 mg/dL — AB (ref 0.50–1.10)
Calcium: 7.9 mg/dL — ABNORMAL LOW (ref 8.4–10.5)
Chloride: 109 mmol/L (ref 96–112)
GFR calc non Af Amer: 50 mL/min — ABNORMAL LOW (ref >90)
GFR, EST AFRICAN AMERICAN: 58 mL/min — AB (ref >90)
Glucose, Bld: 103 mg/dL — ABNORMAL HIGH (ref 70–99)
POTASSIUM: 3.5 mmol/L (ref 3.5–5.1)
Sodium: 142 mmol/L (ref 135–145)

## 2014-04-03 LAB — PROCALCITONIN: Procalcitonin: 1.32 ng/mL

## 2014-04-03 MED ORDER — CIPROFLOXACIN HCL 500 MG PO TABS: 500.0000 mg | ORAL_TABLET | Freq: Two times a day (BID) | ORAL | Status: DC

## 2014-04-03 NOTE — Discharge Summary (Signed)
Physician Discharge Summary  [name] EPP:[id number] DOB: 12/01/1970 DOA: 03/31/2014  PCP: No PCP Per Patient  Admit date: 03/31/2014 Discharge date: 04/03/2014  Time spent: 45 minutes  Recommendations for Outpatient Follow-up:  -Will be discharged home today. -Advised to make appointment with new PCP in 2 weeks.   Discharge Diagnoses:  Principal Problem:   Pyelonephritis Active Problems:   Chronic anemia   CKD (chronic kidney disease) stage 3, GFR 30-59 ml/min   Sepsis   Chest pain   Glucose intolerance (impaired glucose tolerance)   Discharge Condition: Stable and improved  Filed Weights   04/01/14 0000 04/01/14 0400 04/02/14 0500  Weight: 124.6 kg (274 lb 11.1 oz) 124 kg (273 lb 5.9 oz) 128.7 kg (283 lb 11.7 oz)    History of present illness:  Kristi Martin is a 44 y.o. female history of chronic anemia and probably chronic kidney disease stage III presents to the ER because of chest pain and bilateral flank pain with weakness nausea vomiting and loose stools. Patient had come 2 days ago for bilateral flank pain and at that time CT renal study was unremarkable. Patient was discharged home on pain relief medication and muscle relaxant. Patient calls back and at this time on exam patient still has bilateral flank tenderness along with chest pain. Chest pain is retrosternal stabbing in nature increased on deep inspiration. No addition patient has been having multiple episodes of nausea and vomiting. Patient states she also has benign some loose stools which is greenish in color. CT abdomen or the chest was negative for PE. EKG was showing sinus tachycardia with troponins negative. Patient's urine cultures done 2 days ago shows Escherichia coli. Patient was found to be hypotensive with tachycardia. Patient has been admitted for pyelonephritis with possible developing sepsis. Patient also was on steroid taper dose 3 weeks ago for low back pain.   Hospital Course:    Severe Sepsis with Shock -2/2 pyelonephritis. -Sepsis parameters resolved. -Will transfer to floor today. -Procalcitonin improving. -See below for details.  Pyelonephritis -Cx with E Coli, sensitivities pending. -Will be discharged on cipro for 5 more days. -Blood cx remain negative to date.  CKD Stage III -Baseline Cr around 1.28. -Will need OP renal follow up. -Cr improved to 0.99 today on DC. -Suspect she has early DM based on serum glucose levels. Advised to follow up closely with PCP for this.  AOCD -2/2 renal failure. -hb stable around 8.6-9.0.   Procedures:  None   Consultations:  None  Discharge Instructions      Discharge Instructions    Increase activity slowly    Complete by:  As directed             Medication List    STOP taking these medications        nitroGLYCERIN 0.4 MG SL tablet  Commonly known as:  NITROSTAT     predniSONE 10 MG tablet  Commonly known as:  DELTASONE     ZOFRAN PO      TAKE these medications        aspirin EC 81 MG tablet  Take 324 mg by mouth once as needed for moderate pain.     ciprofloxacin 500 MG tablet  Commonly known as:  CIPRO  Take 1 tablet (500 mg total) by mouth 2 (two) times daily.     cyclobenzaprine 10 MG tablet  Commonly known as:  FLEXERIL  Take 1 tablet (10 mg total) by mouth 2 (two) times  daily as needed for muscle spasms.     HYDROcodone-acetaminophen 5-325 MG per tablet  Commonly known as:  NORCO/VICODIN  Take 1-2 tablets by mouth every 6 (six) hours as needed.     ibuprofen 600 MG tablet  Commonly known as:  ADVIL,MOTRIN  Take 1 tablet (600 mg total) by mouth every 6 (six) hours as needed.     methocarbamol 500 MG tablet  Commonly known as:  ROBAXIN  Take 2 tablets (1,000 mg total) by mouth 4 (four) times daily as needed for muscle spasms (muscle spasm/pain).       Allergies  Allergen Reactions  . Benadryl [Diphenhydramine Hcl] Anaphylaxis and Swelling    Back of throat  closes  . Onion Anaphylaxis   Follow-up Information    Schedule an appointment as soon as possible for a visit in 2 weeks to follow up.   Why:  With new primary care physician       The results of significant diagnostics from this hospitalization (including imaging, microbiology, ancillary and laboratory) are listed below for reference.    Significant Diagnostic Studies: Dg Chest 2 View  03/12/2014   CLINICAL DATA:  back pain, dizziness, shortness of breath  EXAM: CHEST  2 VIEW  COMPARISON:  None.  FINDINGS: The heart size and mediastinal contours are within normal limits. Both lungs are clear. The visualized skeletal structures are unremarkable.  IMPRESSION: No active cardiopulmonary disease.   Electronically Signed   By: Conchita Paris M.D.   On: 03/12/2014 20:42   Ct Angio Chest Pe W/cm &/or Wo Cm  03/31/2014   CLINICAL DATA:  Right-sided and central sharp constant chest pain starting today. Patient was seen in the ED 3 days ago with right flank pain. Still with right-sided pain.  EXAM: CT ANGIOGRAPHY CHEST WITH CONTRAST  TECHNIQUE: Multidetector CT imaging of the chest was performed using the standard protocol during bolus administration of intravenous contrast. Multiplanar CT image reconstructions and MIPs were obtained to evaluate the vascular anatomy.  CONTRAST:  192mL OMNIPAQUE IOHEXOL 350 MG/ML SOLN  COMPARISON:  CT chest 12/07/2013  FINDINGS: Examination is technically limited due to motion artifact. Moderately good opacification of the central and proximal segmental pulmonary arteries. No focal filling defects. No evidence of significant central pulmonary embolus. More peripheral vessels are not well visualized.  Normal heart size. Normal caliber thoracic aorta. Accounting for motion artifact, there is no evidence of significant dissection. Great vessel origins are patent. Note of common origin of the carotid arteries. Esophagus is decompressed. No significant lymphadenopathy in the  chest.  Evaluation of lungs is limited due to respiratory motion artifact. There is evidence of atelectasis or infiltration in the lung bases. No focal consolidation. No pneumothorax. No pleural effusions. Airways appear patent.  Included portions of the upper abdominal organs are grossly unremarkable. Degenerative changes in the spine. No destructive bone lesions.  Review of the MIP images confirms the above findings.  IMPRESSION: No evidence of significant pulmonary embolus. Atelectasis or infiltration in the lung bases.   Electronically Signed   By: Lucienne Capers M.D.   On: 03/31/2014 23:05   Dg Chest Port 1 View  03/31/2014   CLINICAL DATA:  Chest pain for 2 hours.  Shortness of breath.  EXAM: PORTABLE CHEST - 1 VIEW  COMPARISON:  Frontal and lateral views 03/12/2014  FINDINGS: Very low lung volumes leading to crowding of bronchovascular structures. The cardiomediastinal contours are normal. No consolidation, pleural effusion, or pneumothorax. No acute osseous abnormalities are seen.  IMPRESSION: Hypoventilatory chest without evidence of acute process.   Electronically Signed   By: Jeb Levering M.D.   On: 03/31/2014 21:44   Ct Renal Stone Study  03/28/2014   CLINICAL DATA:  Right flank pain.  Cervical cancer.  EXAM: CT ABDOMEN AND PELVIS WITHOUT CONTRAST  TECHNIQUE: Multidetector CT imaging of the abdomen and pelvis was performed following the standard protocol without IV contrast.  COMPARISON:  CT abdomen 01/17/2014  FINDINGS: Lung bases are clear  Liver and spleen are normal. Gallbladder is been removed. Bile ducts nondilated. Pancreas is normal  Normal kidneys. No renal obstruction or mass. No renal calculi. No ureteral or bladder calculus.  Negative for bowel obstruction or bowel thickening. Mild sigmoid diverticulosis. No free fluid or free air. Normal appendix  No skeletal lesion.  IMPRESSION: Negative for renal calculi  Normal appendix  No acute abnormality.   Electronically Signed   By:  Franchot Gallo M.D.   On: 03/28/2014 20:47    Microbiology: Recent Results (from the past 240 hour(s))  Urine culture     Status: None   Collection Time: 03/28/14  9:20 PM  Result Value Ref Range Status   Specimen Description URINE, CLEAN CATCH  Final   Special Requests NONE  Final   Colony Count   Final    >=100,000 COLONIES/ML Performed at [facility]    Culture   Final    ESCHERICHIA COLI Performed at [facility]    Report Status 03/31/2014 FINAL  Final   Organism ID, Bacteria ESCHERICHIA COLI  Final      Susceptibility   Escherichia coli - MIC*    AMPICILLIN >=32 RESISTANT Resistant     CEFAZOLIN <=4 SENSITIVE Sensitive     CEFTRIAXONE <=1 SENSITIVE Sensitive     CIPROFLOXACIN <=0.25 SENSITIVE Sensitive     GENTAMICIN <=1 SENSITIVE Sensitive     LEVOFLOXACIN <=0.12 SENSITIVE Sensitive     NITROFURANTOIN <=16 SENSITIVE Sensitive     TOBRAMYCIN <=1 SENSITIVE Sensitive     TRIMETH/SULFA >=320 RESISTANT Resistant     PIP/TAZO <=4 SENSITIVE Sensitive     * ESCHERICHIA COLI  Blood culture (routine x 2)     Status: None (Preliminary result)   Collection Time: 03/31/14  8:50 PM  Result Value Ref Range Status   Specimen Description BLOOD LEFT HAND  Final   Special Requests BOTTLES DRAWN AEROBIC AND ANAEROBIC 6CC  Final   Culture NO GROWTH 3 DAYS  Final   Report Status PENDING  Incomplete  Blood culture (routine x 2)     Status: None (Preliminary result)   Collection Time: 03/31/14  9:03 PM  Result Value Ref Range Status   Specimen Description BLOOD RIGHT ANTECUBITAL  Final   Special Requests BOTTLES DRAWN AEROBIC AND ANAEROBIC 5CC  Final   Culture NO GROWTH 3 DAYS  Final   Report Status PENDING  Incomplete  Urine culture     Status: None (Preliminary result)   Collection Time: 03/31/14 10:27 PM  Result Value Ref Range Status   Specimen Description URINE, CLEAN CATCH  Final   Special Requests NONE  Final   Colony Count   Final    >=100,000  COLONIES/ML Performed at [facility]    Culture   Final    ESCHERICHIA COLI Performed at [facility]    Report Status PENDING  Incomplete  MRSA PCR Screening     Status: None   Collection Time: 04/01/14 12:36 AM  Result Value  Ref Range Status   MRSA by PCR NEGATIVE NEGATIVE Final    Comment:        The GeneXpert MRSA Assay (FDA approved for NASAL specimens only), is one component of a comprehensive MRSA colonization surveillance program. It is not intended to diagnose MRSA infection nor to guide or monitor treatment for MRSA infections.      Labs: Basic Metabolic Panel:  Recent Labs Lab 03/28/14 1920 03/31/14 2050 04/01/14 0217 04/01/14 0547 04/02/14 0513 04/03/14 0541  NA 140 135  --  138 140 142  K 3.4* 3.2*  --  3.8 5.4* 3.5  CL 111 111  --  113* 115* 109  CO2 25 22  --  22 18* 28  GLUCOSE 140* 138*  --  131* 105* 103*  BUN 14 11  --  8 11 7   CREATININE 1.41* 1.58* 1.37* 1.23* 0.99 1.27*  CALCIUM 8.2* 7.6*  --  6.7* 7.6* 7.9*   Liver Function Tests:  Recent Labs Lab 03/31/14 2050 04/01/14 0547  AST 30 64*  ALT 23 42*  ALKPHOS 95 120*  BILITOT 0.7 0.7  PROT 6.4 5.7*  ALBUMIN 2.7* 2.2*    Recent Labs Lab 04/01/14 0217  LIPASE 17   No results for input(s): AMMONIA in the last 168 hours. CBC:  Recent Labs Lab 03/28/14 1920 03/31/14 2050 04/01/14 0217 04/01/14 0547 04/02/14 0513 04/03/14 0541  WBC 10.1 9.1 6.9 7.1 11.4* 6.3  NEUTROABS 8.4* 7.4  --  5.4  --   --   HGB 10.2* 9.7* 8.6* 8.8* 9.2* 9.0*  HCT 32.4* 30.0* 27.2* 27.5* 29.1* 28.1*  MCV 84.6 81.7 83.4 84.1 83.9 82.6  PLT 211 223 212 199 133* 338   Cardiac Enzymes:  Recent Labs Lab 03/31/14 2050 04/01/14 0217  CKTOTAL  --  74  TROPONINI <0.03 <0.03   BNP: BNP (last 3 results)  Recent Labs  03/31/14 2050  BNP 35.0    ProBNP (last 3 results)  Recent Labs  01/17/14 1215  PROBNP 16.5    CBG: No results for input(s): GLUCAP in the last 168  hours.     SignedLelon Frohlich  [facility] Pager: [phone] 04/03/2014, 2:35 PM

## 2014-04-03 NOTE — Progress Notes (Signed)
Patient discharged with instructions, prescription, and care notes.  Verbalized understanding via teach back.  IV was removed and the site was WNL. Patient voiced no further complaints or concerns at the time of discharge.  Appointments per instructions.  Patient left the floor via w/c with staff and family in stable condition.  Pt stated that she wanted to find her an MD in [place], so she stated she would make the appointment.

## 2014-04-03 NOTE — Care Management Note (Signed)
    Page 1 of 1   04/03/2014     1:09:57 PM CARE MANAGEMENT NOTE 04/03/2014  Patient:  Kristi Martin, Kristi Martin   Account Number:  [id number]  Date Initiated:  04/03/2014  Documentation initiated by:  Theophilus Kinds  Subjective/Objective Assessment:   Pt admitted from home with pyelonephritis. Pt lives alone and will return home at discharge. Pt is independent with ADL's.     Action/Plan:   Pt discharged home today. Attempted to arrange followup with Dr. Legrand Rams but pt has to get insurance card changed with Dr. Legrand Rams name before they can make appt. Pt made aware and will have MD name added and make appt with Dr. Legrand Rams.   Anticipated DC Date:  04/03/2014   Anticipated DC Plan:  [place]  CM consult      Choice offered to / List presented to:             Status of service:  Completed, signed off Medicare Important Message given?   (If response is "NO", the following Medicare IM given date fields will be blank) Date Medicare IM given:   Medicare IM given by:   Date Additional Medicare IM given:   Additional Medicare IM given by:    Discharge Disposition:  HOME/SELF CARE  Per UR Regulation:    If discussed at Long Length of Stay Meetings, dates discussed:    Comments:  04/03/14 [place], RN BSN CM

## 2014-04-04 LAB — URINE CULTURE: Colony Count: >=100000

## 2014-04-05 LAB — CULTURE, BLOOD (ROUTINE X 2)
Culture: NO GROWTH
Culture: NO GROWTH

## 2014-04-10 ENCOUNTER — Ambulatory Visit: Payer: No Typology Code available for payment source

## 2014-04-11 ENCOUNTER — Emergency Department (HOSPITAL_COMMUNITY): Payer: No Typology Code available for payment source

## 2014-04-11 ENCOUNTER — Ambulatory Visit (INDEPENDENT_AMBULATORY_CARE_PROVIDER_SITE_OTHER): Payer: No Typology Code available for payment source | Admitting: Family Medicine

## 2014-04-11 ENCOUNTER — Emergency Department (HOSPITAL_COMMUNITY)
Admission: EM | Admit: 2014-04-11 | Discharge: 2014-04-11 | Disposition: A | Discharge status: Home / Self Care | Payer: No Typology Code available for payment source | Attending: Emergency Medicine | Admitting: Emergency Medicine

## 2014-04-11 ENCOUNTER — Encounter (HOSPITAL_COMMUNITY): Payer: Self-pay

## 2014-04-11 VITALS — BP 104/72 | HR 84 | Temp 97.6°F | Resp 16 | Ht 66.0 in | Wt 258.8 lb

## 2014-04-11 DIAGNOSIS — D649 Anemia, unspecified: Secondary | ICD-10-CM

## 2014-04-11 DIAGNOSIS — R109 Unspecified abdominal pain: Secondary | ICD-10-CM | POA: Insufficient documentation

## 2014-04-11 DIAGNOSIS — R1011 Right upper quadrant pain: Secondary | ICD-10-CM | POA: Diagnosis not present

## 2014-04-11 DIAGNOSIS — N10 Acute tubulo-interstitial nephritis: Secondary | ICD-10-CM

## 2014-04-11 DIAGNOSIS — R0789 Other chest pain: Secondary | ICD-10-CM | POA: Diagnosis not present

## 2014-04-11 DIAGNOSIS — R1031 Right lower quadrant pain: Secondary | ICD-10-CM

## 2014-04-11 DIAGNOSIS — R079 Chest pain, unspecified: Secondary | ICD-10-CM | POA: Diagnosis not present

## 2014-04-11 DIAGNOSIS — R112 Nausea with vomiting, unspecified: Secondary | ICD-10-CM | POA: Diagnosis not present

## 2014-04-11 DIAGNOSIS — R7989 Other specified abnormal findings of blood chemistry: Secondary | ICD-10-CM | POA: Diagnosis not present

## 2014-04-11 DIAGNOSIS — Z7982 Long term (current) use of aspirin: Secondary | ICD-10-CM | POA: Diagnosis not present

## 2014-04-11 DIAGNOSIS — Z3202 Encounter for pregnancy test, result negative: Secondary | ICD-10-CM | POA: Insufficient documentation

## 2014-04-11 DIAGNOSIS — N189 Chronic kidney disease, unspecified: Secondary | ICD-10-CM | POA: Diagnosis not present

## 2014-04-11 DIAGNOSIS — G8929 Other chronic pain: Secondary | ICD-10-CM | POA: Diagnosis not present

## 2014-04-11 DIAGNOSIS — Z8541 Personal history of malignant neoplasm of cervix uteri: Secondary | ICD-10-CM | POA: Diagnosis not present

## 2014-04-11 DIAGNOSIS — Z862 Personal history of diseases of the blood and blood-forming organs and certain disorders involving the immune mechanism: Secondary | ICD-10-CM | POA: Diagnosis not present

## 2014-04-11 DIAGNOSIS — Z8739 Personal history of other diseases of the musculoskeletal system and connective tissue: Secondary | ICD-10-CM | POA: Diagnosis not present

## 2014-04-11 DIAGNOSIS — N183 Chronic kidney disease, stage 3 unspecified: Secondary | ICD-10-CM

## 2014-04-11 LAB — POCT URINALYSIS DIPSTICK
Bilirubin, UA: NEGATIVE
Blood, UA: NEGATIVE
GLUCOSE UA: NEGATIVE
Ketones, UA: NEGATIVE
Nitrite, UA: NEGATIVE
Protein, UA: NEGATIVE
SPEC GRAV UA: 1.015
UROBILINOGEN UA: 0.2
pH, UA: 5.5

## 2014-04-11 LAB — URINALYSIS, ROUTINE W REFLEX MICROSCOPIC
Bilirubin Urine: NEGATIVE
GLUCOSE, UA: NEGATIVE mg/dL
HGB URINE DIPSTICK: NEGATIVE
Ketones, ur: NEGATIVE mg/dL
Leukocytes, UA: NEGATIVE
NITRITE: NEGATIVE
PH: 5.5 (ref 5.0–8.0)
Protein, ur: NEGATIVE mg/dL
Specific Gravity, Urine: 1.015 (ref 1.005–1.030)
Urobilinogen, UA: 0.2 mg/dL (ref 0.0–1.0)

## 2014-04-11 LAB — POCT CBC
GRANULOCYTE PERCENT: 68.3 % (ref 37–80)
HCT, POC: 35.1 % — AB (ref 37.7–47.9)
HEMOGLOBIN: 11.1 g/dL — AB (ref 12.2–16.2)
Lymph, poc: 2.1 (ref 0.6–3.4)
MCH, POC: 26.2 pg — AB (ref 27–31.2)
MCHC: 31.7 g/dL — AB (ref 31.8–35.4)
MCV: 82.9 fL (ref 80–97)
MID (cbc): 0.3 (ref 0–0.9)
MPV: 7.9 fL (ref 0–99.8)
PLATELET COUNT, POC: 495 10*3/uL — AB (ref 142–424)
POC GRANULOCYTE: 5.3 (ref 2–6.9)
POC LYMPH PERCENT: 27.9 %L (ref 10–50)
POC MID %: 3.8 % (ref 0–12)
RBC: 4.24 M/uL (ref 4.04–5.48)
RDW, POC: 17.8 %
WBC: 7.7 10*3/uL (ref 4.6–10.2)

## 2014-04-11 LAB — POCT UA - MICROSCOPIC ONLY
CASTS, UR, LPF, POC: NEGATIVE
CRYSTALS, UR, HPF, POC: NEGATIVE
RBC, urine, microscopic: NEGATIVE
Yeast, UA: NEGATIVE

## 2014-04-11 LAB — COMPREHENSIVE METABOLIC PANEL
ALT: 33 U/L (ref 0–35)
ANION GAP: 6 (ref 5–15)
AST: 18 U/L (ref 0–37)
Albumin: 2.9 g/dL — ABNORMAL LOW (ref 3.5–5.2)
Alkaline Phosphatase: 109 U/L (ref 39–117)
BILIRUBIN TOTAL: 0.4 mg/dL (ref 0.3–1.2)
BUN: 11 mg/dL (ref 6–23)
CO2: 23 mmol/L (ref 19–32)
CREATININE: 1.28 mg/dL — AB (ref 0.50–1.10)
Calcium: 8.7 mg/dL (ref 8.4–10.5)
Chloride: 109 mmol/L (ref 96–112)
GFR calc Af Amer: 58 mL/min — ABNORMAL LOW (ref >90)
GFR, EST NON AFRICAN AMERICAN: 50 mL/min — AB (ref >90)
GLUCOSE: 97 mg/dL (ref 70–99)
POTASSIUM: 4.6 mmol/L (ref 3.5–5.1)
Sodium: 138 mmol/L (ref 135–145)
Total Protein: 6.3 g/dL (ref 6.0–8.3)

## 2014-04-11 LAB — CBC
HEMATOCRIT: 32.8 % — AB (ref 36.0–46.0)
HEMOGLOBIN: 10.3 g/dL — AB (ref 12.0–15.0)
MCH: 26.4 pg (ref 26.0–34.0)
MCHC: 31.4 g/dL (ref 30.0–36.0)
MCV: 84.1 fL (ref 78.0–100.0)
Platelets: 458 10*3/uL — ABNORMAL HIGH (ref 150–400)
RBC: 3.9 MIL/uL (ref 3.87–5.11)
RDW: 16.5 % — ABNORMAL HIGH (ref 11.5–15.5)
WBC: 7.4 10*3/uL (ref 4.0–10.5)

## 2014-04-11 LAB — I-STAT TROPONIN, ED: Troponin i, poc: 0 ng/mL (ref 0.00–0.08)

## 2014-04-11 LAB — LIPASE, BLOOD: Lipase: 30 U/L (ref 11–59)

## 2014-04-11 LAB — GLUCOSE, POCT (MANUAL RESULT ENTRY): POC GLUCOSE: 116 mg/dL — AB (ref 70–99)

## 2014-04-11 LAB — POC URINE PREG, ED: Preg Test, Ur: NEGATIVE

## 2014-04-11 MED ORDER — METHOCARBAMOL 500 MG PO TABS: 500.0000 mg | ORAL_TABLET | Freq: Two times a day (BID) | ORAL | Status: DC | PRN

## 2014-04-11 MED ORDER — ONDANSETRON HCL 4 MG/2ML IJ SOLN
4.0000 mg | Freq: Once | INTRAMUSCULAR | Status: AC
  Administered 2014-04-11: 4 mg via INTRAVENOUS
  Filled 2014-04-11: qty 2

## 2014-04-11 MED ORDER — HYDROCODONE-ACETAMINOPHEN 5-325 MG PO TABS: 1.0000 | ORAL_TABLET | ORAL | Status: DC | PRN

## 2014-04-11 MED ORDER — ONDANSETRON 4 MG PO TBDP: 4.0000 mg | ORAL_TABLET | Freq: Three times a day (TID) | ORAL | Status: DC | PRN

## 2014-04-11 MED ORDER — ONDANSETRON 4 MG PO TBDP
4.0000 mg | ORAL_TABLET | Freq: Once | ORAL | Status: AC
  Administered 2014-04-11: 4 mg via ORAL

## 2014-04-11 MED ORDER — SODIUM CHLORIDE 0.9 % IV BOLUS (SEPSIS)
1000.0000 mL | Freq: Once | INTRAVENOUS | Status: AC
  Administered 2014-04-11: 1000 mL via INTRAVENOUS

## 2014-04-11 NOTE — Discharge Instructions (Signed)
Nausea and Vomiting °Nausea is a sick feeling that often comes before throwing up (vomiting). Vomiting is a reflex where stomach contents come out of your mouth. Vomiting can cause severe loss of body fluids (dehydration). Children and elderly adults can become dehydrated quickly, especially if they also have diarrhea. Nausea and vomiting are symptoms of a condition or disease. It is important to find the cause of your symptoms. °CAUSES  °· Direct irritation of the stomach lining. This irritation can result from increased acid production (gastroesophageal reflux disease), infection, food poisoning, taking certain medicines (such as nonsteroidal anti-inflammatory drugs), alcohol use, or tobacco use. °· Signals from the brain. These signals could be caused by a headache, heat exposure, an inner ear disturbance, increased pressure in the brain from injury, infection, a tumor, or a concussion, pain, emotional stimulus, or metabolic problems. °· An obstruction in the gastrointestinal tract (bowel obstruction). °· Illnesses such as diabetes, hepatitis, gallbladder problems, appendicitis, kidney problems, cancer, sepsis, atypical symptoms of a heart attack, or eating disorders. °· Medical treatments such as chemotherapy and radiation. °· Receiving medicine that makes you sleep (general anesthetic) during surgery. °DIAGNOSIS °Your caregiver may ask for tests to be done if the problems do not improve after a few days. Tests may also be done if symptoms are severe or if the reason for the nausea and vomiting is not clear. Tests may include: °· Urine tests. °· Blood tests. °· Stool tests. °· Cultures (to look for evidence of infection). °· X-rays or other imaging studies. °Test results can help your caregiver make decisions about treatment or the need for additional tests. °TREATMENT °You need to stay well hydrated. Drink frequently but in small amounts. You may wish to drink water, sports drinks, clear broth, or eat frozen  ice pops or gelatin dessert to help stay hydrated. When you eat, eating slowly may help prevent nausea. There are also some antinausea medicines that may help prevent nausea. °HOME CARE INSTRUCTIONS  °· Take all medicine as directed by your caregiver. °· If you do not have an appetite, do not force yourself to eat. However, you must continue to drink fluids. °· If you have an appetite, eat a normal diet unless your caregiver tells you differently. °¨ Eat a variety of complex carbohydrates (rice, wheat, potatoes, bread), lean meats, yogurt, fruits, and vegetables. °¨ Avoid high-fat foods because they are more difficult to digest. °· Drink enough water and fluids to keep your urine clear or pale yellow. °· If you are dehydrated, ask your caregiver for specific rehydration instructions. Signs of dehydration may include: °¨ Severe thirst. °¨ Dry lips and mouth. °¨ Dizziness. °¨ Dark urine. °¨ Decreasing urine frequency and amount. °¨ Confusion. °¨ Rapid breathing or pulse. °SEEK IMMEDIATE MEDICAL CARE IF:  °· You have blood or brown flecks (like coffee grounds) in your vomit. °· You have black or bloody stools. °· You have a severe headache or stiff neck. °· You are confused. °· You have severe abdominal pain. °· You have chest pain or trouble breathing. °· You do not urinate at least once every 8 hours. °· You develop cold or clammy skin. °· You continue to vomit for longer than 24 to 48 hours. °· You have a fever. °MAKE SURE YOU:  °· Understand these instructions. °· Will watch your condition. °· Will get help right away if you are not doing well or get worse. °Document Released: 01/20/2005 Document Revised: 04/14/2011 Document Reviewed: 06/19/2010 °ExitCare® Patient Information ©2015 [employer]. This information is not intended   to replace advice given to you by your health care provider. Make sure you discuss any questions you have with your health care provider.  Chest Pain (Nonspecific) It is often hard to  give a diagnosis for the cause of chest pain. There is always a chance that your pain could be related to something serious, such as a heart attack or a blood clot in the lungs. You need to follow up with your doctor. HOME CARE  If antibiotic medicine was given, take it as directed by your doctor. Finish the medicine even if you start to feel better.  For the next few days, avoid activities that bring on chest pain. Continue physical activities as told by your doctor.  Do not use any tobacco products. This includes cigarettes, chewing tobacco, and e-cigarettes.  Avoid drinking alcohol.  Only take medicine as told by your doctor.  Follow your doctor's suggestions for more testing if your chest pain does not go away.  Keep all doctor visits you made. GET HELP IF:  Your chest pain does not go away, even after treatment.  You have a rash with blisters on your chest.  You have a fever. GET HELP RIGHT AWAY IF:   You have more pain or pain that spreads to your arm, neck, jaw, back, or belly (abdomen).  You have shortness of breath.  You cough more than usual or cough up blood.  You have very bad back or belly pain.  You feel sick to your stomach (nauseous) or throw up (vomit).  You have very bad weakness.  You pass out (faint).  You have chills. This is an emergency. Do not wait to see if the problems will go away. Call your local emergency services (911 in [place] Do not drive yourself to the hospital. MAKE SURE YOU:   Understand these instructions.  Will watch your condition.  Will get help right away if you are not doing well or get worse. Document Released: 07/09/2007 Document Revised: 01/25/2013 Document Reviewed: 07/09/2007 [facility] Patient Information 2015 [place], [place]. This information is not intended to replace advice given to you by your health care provider. Make sure you discuss any questions you have with your health care provider. Chronic Kidney  Disease Chronic kidney disease occurs when the kidneys are damaged over a long period. The kidneys are two organs that lie on either side of the spine between the middle of the back and the front of the abdomen. The kidneys:   Remove wastes and extra water from the blood.   Produce important hormones. These help keep bones strong, regulate blood pressure, and help create red blood cells.   Balance the fluids and chemicals in the blood and tissues. A small amount of kidney damage may not cause problems, but a large amount of damage may make it difficult or impossible for the kidneys to work the way they should. If steps are not taken to slow down the kidney damage or stop it from getting worse, the kidneys may stop working permanently. Most of the time, chronic kidney disease does not go away. However, it can often be controlled, and those with the disease can usually live normal lives. CAUSES  The most common causes of chronic kidney disease are diabetes and high blood pressure (hypertension). Chronic kidney disease may also be caused by:   Diseases that cause the kidneys' filters to become inflamed.   Diseases that affect the immune system.   Genetic diseases.   Medicines that damage the  kidneys, such as anti-inflammatory medicines.  Poisoning or exposure to toxic substances.   A reoccurring kidney or urinary infection.   A problem with urine flow. This may be caused by:   Cancer.   Kidney stones.   An enlarged prostate in males. SIGNS AND SYMPTOMS  Because the kidney damage in chronic kidney disease occurs slowly, symptoms develop slowly and may not be obvious until the kidney damage becomes severe. A person may have a kidney disease for years without showing any symptoms. Symptoms can include:   Swelling (edema) of the legs, ankles, or feet.   Tiredness (lethargy).   Nausea or vomiting.   Confusion.   Problems with urination, such as:   Decreased urine  production.   Frequent urination, especially at night.   Frequent accidents in children who are potty trained.   Muscle twitches and cramps.   Shortness of breath.  Weakness.   Persistent itchiness.   Loss of appetite.  Metallic taste in the mouth.  Trouble sleeping.  Slowed development in children.  Short stature in children. DIAGNOSIS  Chronic kidney disease may be detected and diagnosed by tests, including blood, urine, imaging, or kidney biopsy tests.  TREATMENT  Most chronic kidney diseases cannot be cured. Treatment usually involves relieving symptoms and preventing or slowing the progression of the disease. Treatment may include:   A special diet. You may need to avoid alcohol and foods thatare salty and high in potassium.   Medicines. These may:   Lower blood pressure.   Relieve anemia.   Relieve swelling.   Protect the bones. HOME CARE INSTRUCTIONS   Follow your prescribed diet.   Take medicines only as directed by your health care provider. Do not take any new medicines (prescription, over-the-counter, or nutritional supplements) unless approved by your health care provider. Many medicines can worsen your kidney damage or need to have the dose adjusted.   Quit smoking if you smoke. Talk to your health care provider about a smoking cessation program.   Keep all follow-up visits as directed by your health care provider. SEEK IMMEDIATE MEDICAL CARE IF:  Your symptoms get worse or you develop new symptoms.   You develop symptoms of end-stage kidney disease. These include:   Headaches.   Abnormally dark or light skin.   Numbness in the hands or feet.   Easy bruising.   Frequent hiccups.   Menstruation stops.   You have a fever.   You have decreased urine production.   You havepain or bleeding when urinating. MAKE SURE YOU:  Understand these instructions.  Will watch your condition.  Will get help right away  if you are not doing well or get worse. FOR MORE INFORMATION   American Association of Kidney Patients: BombTimer.gl  National Kidney Foundation: www.kidney.[place]: https://mathis.com/  [facility]: www.lifeoptions.org and www.kidneyschool.org Document Released: 10/30/2007 Document Revised: 06/06/2013 Document Reviewed: 09/19/2011 [facility] Patient Information 2015 [place], [place]. This information is not intended to replace advice given to you by your health care provider. Make sure you discuss any questions you have with your health care provider.

## 2014-04-11 NOTE — Progress Notes (Addendum)
Subjective:  This chart was scribed for Wendie Agreste, MD by Ladene Artist, ED Scribe. The patient was seen in room 1. Patient's care was started at 8:51 AM.    Patient ID: Kristi Martin, female    DOB: 06-Oct-1970, 44 y.o.   MRN: [id number]  Chief Complaint  Patient presents with  . Establish Care    Seeking Primary Care   HPI HPI Comments: Kristi Martin is a 44 y.o. female who presents to the [facility] to establish care. She was recently admitted to [facility] from 2/26-2/29/16 for pyelonephritis, sepsis, anemia, CKD stage 3, glucose intolerance and chest pain. On chart review, also had multiple Emergency Department visits last year for various concerns. She had recently come off a steroid taper a few weeks prior for low back pain. Urine culture positive for E.coli. She had a CT angiogram of chest without any signs of PE. Troponin negative. In her prior CT of abdomen on 03/28/14 was negative.   Today, pt reports sudden onset of L flank pain onset yesterday that is exacerbated with touching and movement. Pt reports gradually worsening RUQ abdominal pain and chest pain since discharge last week, worsened over the past 3 days. Chest pain is exacerbated with lying down, burping, hiccups and eating. She further reports chest pressure for the past 3 days, nausea, dizziness when she first stands, emesis for the past 2 days. Pt states that she has not been able to keep down solid foods other than mashed potatoes and a sausage dog yesterday. She has consumed over a case of water and cranberry juice over the past 4 days. Pt denies recent falls, injury, twists, light-headedness, fever. No h/o stomach problems including ulcers or cardiac history. Pt finished her antibiotics 2 days ago.   Pyelonephritis with Sepsis, E. coli Sepsis resolved in hospital. She was discharged on Cipro for 5 days. Blood cultures were negative.   Chronic Kidney Disease Stage 3 Baseline  creatine around 1.28, down to 0.99 on discharge. Plan on outpatient renal follow-up.  Anemia of Chronic Disease Thought to be secondary to renal disease. Hgb around 8.6-9.0 in hospital. Thought to be stable, however, in review of chart, Hgb was 11.8 in December 2015 but 9.9 in December 2014.   Hyperglycemia  Noted to be 103-105 in hospital. No known h/o DM.   No PCP Per Patient  Patient Active Problem List   Diagnosis Date Noted  . Glucose intolerance (impaired glucose tolerance) 04/03/2014  . Chronic anemia 04/01/2014  . CKD (chronic kidney disease) stage 3, GFR 30-59 ml/min 04/01/2014  . Sepsis 04/01/2014  . Chest pain   . Pyelonephritis 03/31/2014  . Symptomatic cholelithiasis 01/19/2013   Past Medical History  Diagnosis Date  . Left knee pain   . Migraine   . Cancer     cervical cx at age 1  . Cervical cancer   . Chronic back pain   . Sciatica   . Anemia   . Chronic kidney disease    Past Surgical History  Procedure Laterality Date  . Tubal ligation    . Cholecystectomy N/A 01/20/2013    Procedure: LAPAROSCOPIC CHOLECYSTECTOMY WITH INTRAOPERATIVE CHOLANGIOGRAM;  Surgeon: Shann Medal, MD;  Location: [facility];  Service: General;  Laterality: N/A;   Allergies  Allergen Reactions  . Benadryl [Diphenhydramine Hcl] Anaphylaxis and Swelling    Back of throat closes  . Onion Anaphylaxis   Prior to Admission medications   Medication Sig Start  Date End Date Taking? Authorizing Provider  aspirin EC 81 MG tablet Take 324 mg by mouth once as needed for moderate pain.   Yes Historical Provider, MD  ciprofloxacin (CIPRO) 500 MG tablet Take 1 tablet (500 mg total) by mouth 2 (two) times daily. 04/03/14  Yes Estela Leonie Green, MD  cyclobenzaprine (FLEXERIL) 10 MG tablet Take 1 tablet (10 mg total) by mouth 2 (two) times daily as needed for muscle spasms. 03/28/14  Yes Fredia Sorrow, MD  HYDROcodone-acetaminophen (NORCO/VICODIN) 5-325 MG per tablet Take 1-2 tablets by  mouth every 6 (six) hours as needed. 03/28/14  Yes Fredia Sorrow, MD  ibuprofen (ADVIL,MOTRIN) 600 MG tablet Take 1 tablet (600 mg total) by mouth every 6 (six) hours as needed. 02/13/14  Yes [name], MD  methocarbamol (ROBAXIN) 500 MG tablet Take 2 tablets (1,000 mg total) by mouth 4 (four) times daily as needed for muscle spasms (muscle spasm/pain). 03/12/14  Yes Francine Graven, DO   History   Social History  . Marital Status: Divorced    Spouse Name: N/A  . Number of Children: N/A  . Years of Education: N/A   Occupational History  . Not on file.   Social History Main Topics  . Smoking status: Never Smoker   . Smokeless tobacco: Never Used  . Alcohol Use: Yes  . Drug Use: No  . Sexual Activity: Yes    Birth Control/ Protection: None, Surgical   Other Topics Concern  . Not on file   Social History Narrative   Review of Systems  Constitutional: Negative for fever.  Respiratory: Positive for shortness of breath (when having chest pain. ).   Cardiovascular: Positive for chest pain.  Gastrointestinal: Positive for nausea, vomiting and abdominal pain.  Genitourinary: Positive for flank pain.  Neurological: Positive for dizziness. Negative for light-headedness.      Objective:   Physical Exam  Constitutional: She is oriented to person, place, and time. She appears well-developed and well-nourished. No distress.  HENT:  Head: Normocephalic and atraumatic.  Eyes: Conjunctivae and EOM are normal. Pupils are equal, round, and reactive to light.  Neck: Carotid bruit is not present.  Cardiovascular: Normal rate, regular rhythm, normal heart sounds and intact distal pulses.   Pulmonary/Chest: Effort normal and breath sounds normal.  Min ttp over chest wall  Abdominal: Soft. She exhibits no pulsatile midline mass. There is tenderness in the right upper quadrant and right lower quadrant. There is guarding and CVA tenderness (bilaterally).  Guarding with RUQ and slight RLQ  tenderness.   Neurological: She is alert and oriented to person, place, and time.  Skin: Skin is warm and dry.  Psychiatric: She has a normal mood and affect. Her behavior is normal.  Vitals reviewed.  Filed Vitals:   04/11/14 0818  BP: 104/72  Pulse: 84  Temp: 97.6 F (36.4 C)  TempSrc: Oral  Resp: 16  Height: 5\' 6"  (1.676 m)  Weight: 258 lb 12.8 oz (117.391 kg)  SpO2: 98%   Results for orders placed or performed in visit on 04/11/14  POCT CBC  Result Value Ref Range   WBC 7.7 4.6 - 10.2 K/uL   Lymph, poc 2.1 0.6 - 3.4   POC LYMPH PERCENT 27.9 10 - 50 %L   MID (cbc) 0.3 0 - 0.9   POC MID % 3.8 0 - 12 %M   POC Granulocyte 5.3 2 - 6.9   Granulocyte percent 68.3 37 - 80 %G   RBC 4.24 4.04 -  5.48 M/uL   Hemoglobin 11.1 (A) 12.2 - 16.2 g/dL   HCT, POC 35.1 (A) 37.7 - 47.9 %   MCV 82.9 80 - 97 fL   MCH, POC 26.2 (A) 27 - 31.2 pg   MCHC 31.7 (A) 31.8 - 35.4 g/dL   RDW, POC 17.8 %   Platelet Count, POC 495 (A) 142 - 424 K/uL   MPV 7.9 0 - 99.8 fL  POCT glucose (manual entry)  Result Value Ref Range   POC Glucose 116 (A) 70 - 99 mg/dl  POCT UA - Microscopic Only  Result Value Ref Range   WBC, Ur, HPF, POC 2-6    RBC, urine, microscopic neg    Bacteria, U Microscopic small    Mucus, UA small    Epithelial cells, urine per micros 5-10    Crystals, Ur, HPF, POC neg    Casts, Ur, LPF, POC neg    Yeast, UA neg   POCT urinalysis dipstick  Result Value Ref Range   Color, UA yellow    Clarity, UA hazy    Glucose, UA neg    Bilirubin, UA neg    Ketones, UA neg    Spec Grav, UA 1.015    Blood, UA neg    pH, UA 5.5    Protein, UA neg    Urobilinogen, UA 0.2    Nitrite, UA neg    Leukocytes, UA Trace   EKG: Nonspecific STs in inferolateral leads without apparent acute findings when compared to 03/31/14 EKG.     Assessment & Plan:  Kristi Martin is a 44 y.o. female RUQ abdominal pain, RLQ abdominal pain Bilateral flank pain, recent Acute pyelonephritis,  Non-intractable vomiting with nausea, vomiting of unspecified type - Plan: POCT CBC, POCT glucose (manual entry), POCT UA - Microscopic Only, POCT urinalysis dipstick  - recent hospitalization for sepsis with pyelo, s/p Cipro for cipro-sensitive E.coli UTI, but now worsened RUQ/RLQ abd pain., persistent right flank pain and new left flank pain, with n/v (but tolerating fluids).  Underlying CKD. Few WBC on U/a today - may have early recurrence of UTI/pyelo vs. Persistent infection.   - zofran 4mg  ODT given in office.   - eval in ER, possible repeat hospitalization.   Chest pain, unspecified chest pain type - Plan: EKG 12-Lead  - some of her description may be GERD or esophageal source and difficulty passing solids - achalasia?, but worsened chest pain with associated dyspnea past 3 days. Borderline EKG without acute findings, recent hospitalization had negative CT for PE, and normal troponin. Less likely cardiac source, but will have evaluated further in ER.  Ems called for transport, IV placed.   Anemia, unspecified anemia type  - stable/improved from hospitalization.   Chronic kidney disease, stage 3 (moderate)  - discharge creatinine had improved, but with vomiting - stat BMP likely needed - can have performed in ER.   10:09 AM EMS called for transport,  IV placed.   10:22 AM Report and transfer of care to EMS.   No orders of the defined types were placed in this encounter.   Patient Instructions  Evaluation in emergency room for your vomiting, chest pain, abdominal pain, new left flank pain and recent urinary/kidney infection.  If follow up needed after hospitalization - return here.

## 2014-04-11 NOTE — Patient Instructions (Signed)
Evaluation in emergency room for your vomiting, chest pain, abdominal pain, new left flank pain and recent urinary/kidney infection.  If follow up needed after hospitalization - return here.

## 2014-04-11 NOTE — ED Notes (Signed)
Will, PA at the bedside.  

## 2014-04-11 NOTE — ED Notes (Signed)
Patient ambulating to bathroom to urinate and provide a sample.

## 2014-04-11 NOTE — ED Notes (Signed)
Per [facility], pt from [facility] after being discharged from [facility]. 3 days ago started having chest pain and abd pain. Has nausea and vomiting but no diarrhea.

## 2014-04-11 NOTE — ED Provider Notes (Signed)
CSN: [id number]     Arrival date & time 04/11/14  1049 History   First MD Initiated Contact with Patient 04/11/14 1051     Chief Complaint  Patient presents with  . Abdominal Pain  . Chest Pain   Kristi Martin is a 44 y.o. female with history of chronic kidney disease who presents the emergency department complaining of chest pain abdominal pain associated with nausea and vomiting ongoing for the past 3 days. The patient was recently admitted at [facility] for pyelonephritis on her right side. She reports she was discharged 3 days ago. Patient has continued to have right flank and right sided abdominal pain. She reports having this pain for weeks. She reports that 2 days ago she started having left flank pain as well as worsening nausea and vomiting and substernal chest pain. She reports this chest pain is worse at night and feels like acid reflux. She reports lots of belching. Her chest pain is substernal and non-radiating. She rates her pain at 0/10 and describes it more of a discomfort. She also reports suprapubic pain ongoing for the past 2 days. She also reports urgency for the past 3 days. Her abdominal pain does not seem to be related to eating. Patient's previous abdominal surgery includes a cholecystectomy and a tubal ligation. The patient states that she went to see a primary care provider today for follow-up and the provider sent her to the emergency department. She reports not wanting to come to the ED, but came because PCP sent her.  The patient denies fevers, chills, vaginal bleeding, vaginal discharge, dysuria, rashes, hemataemesis, hematochezia, or hematuria.  (Consider location/radiation/quality/duration/timing/severity/associated sxs/prior Treatment) HPI  Past Medical History  Diagnosis Date  . Left knee pain   . Migraine   . Cancer     cervical cx at age 25  . Cervical cancer   . Chronic back pain   . Sciatica   . Anemia   . Chronic kidney disease    Past  Surgical History  Procedure Laterality Date  . Tubal ligation    . Cholecystectomy N/A 01/20/2013    Procedure: LAPAROSCOPIC CHOLECYSTECTOMY WITH INTRAOPERATIVE CHOLANGIOGRAM;  Surgeon: Shann Medal, MD;  Location: [facility];  Service: General;  Laterality: N/A;   Family History  Problem Relation Age of Onset  . Cancer Mother   . Colon cancer Mother   . Breast cancer Maternal Grandmother   . Breast cancer Paternal Grandmother    History  Substance Use Topics  . Smoking status: Never Smoker   . Smokeless tobacco: Never Used  . Alcohol Use: Yes   OB History    Gravida Para Term Preterm AB TAB SAB Ectopic Multiple Living   10 7 6 1      7      Review of Systems  Constitutional: Negative for fever and chills.  HENT: Negative for congestion and sore throat.   Eyes: Negative for visual disturbance.  Respiratory: Negative for cough, shortness of breath and wheezing.   Cardiovascular: Positive for chest pain. Negative for palpitations and leg swelling.  Gastrointestinal: Positive for nausea, vomiting and abdominal pain. Negative for diarrhea and blood in stool.  Genitourinary: Positive for urgency and flank pain. Negative for dysuria, frequency, hematuria, decreased urine volume, vaginal bleeding, vaginal discharge, difficulty urinating and vaginal pain.  Musculoskeletal: Negative for back pain and neck pain.  Skin: Negative for rash.  Neurological: Negative for light-headedness and headaches.      Allergies  Benadryl and Onion  Home Medications   Prior to Admission medications   Medication Sig Start Date End Date Taking? Authorizing Provider  aspirin EC 81 MG tablet Take 81 mg by mouth daily.     Historical Provider, MD  ciprofloxacin (CIPRO) 500 MG tablet Take 1 tablet (500 mg total) by mouth 2 (two) times daily. Patient not taking: Reported on 04/11/2014 04/03/14   Erline Hau, MD  cyclobenzaprine (FLEXERIL) 10 MG tablet Take 1 tablet (10 mg total) by mouth 2  (two) times daily as needed for muscle spasms. Patient not taking: Reported on 04/11/2014 03/28/14   Fredia Sorrow, MD  HYDROcodone-acetaminophen (NORCO/VICODIN) 5-325 MG per tablet Take 1 tablet by mouth every 4 (four) hours as needed. 04/11/14   Waynetta Pean, PA-C  ibuprofen (ADVIL,MOTRIN) 600 MG tablet Take 1 tablet (600 mg total) by mouth every 6 (six) hours as needed. Patient not taking: Reported on 04/11/2014 02/13/14   [name], MD  methocarbamol (ROBAXIN) 500 MG tablet Take 1 tablet (500 mg total) by mouth 2 (two) times daily as needed for muscle spasms. 04/11/14   Waynetta Pean, PA-C  ondansetron (ZOFRAN ODT) 4 MG disintegrating tablet Take 1 tablet (4 mg total) by mouth every 8 (eight) hours as needed for nausea or vomiting. 04/11/14   Waynetta Pean, PA-C   BP 111/64 mmHg  Pulse 72  Temp(Src) 97.8 F (36.6 C) (Oral)  Resp 24  Ht 5\' 6"  (1.676 m)  Wt 258 lb (117.028 kg)  BMI 41.66 kg/m2  SpO2 98%  LMP 03/05/2014 Physical Exam  Constitutional: She is oriented to person, place, and time. She appears well-developed and well-nourished. No distress.  HENT:  Head: Normocephalic and atraumatic.  Mouth/Throat: Oropharynx is clear and moist. No oropharyngeal exudate.  Eyes: Conjunctivae are normal. Pupils are equal, round, and reactive to light. Right eye exhibits no discharge. Left eye exhibits no discharge.  Neck: Normal range of motion. Neck supple. No JVD present. No tracheal deviation present.  Cardiovascular: Normal rate, regular rhythm, normal heart sounds and intact distal pulses.  Exam reveals no gallop and no friction rub.   No murmur heard. Bilateral radial, posterior tibialis and dorsalis pedis pulses are intact.   Pulmonary/Chest: Effort normal and breath sounds normal. No respiratory distress. She has no wheezes. She has no rales. She exhibits no tenderness.  Abdominal: Soft. Bowel sounds are normal. She exhibits no distension. There is tenderness.  Abdomen is soft. The patient  has bilateral CVA tenderness. She has right sided abdominal tenderness. No rebound tenderness.  Musculoskeletal: She exhibits no edema or tenderness.  No lower extremity edema or tenderness.  Lymphadenopathy:    She has no cervical adenopathy.  Neurological: She is alert and oriented to person, place, and time. Coordination normal.  Skin: Skin is warm and dry. No rash noted. She is not diaphoretic. No erythema. No pallor.  Psychiatric: She has a normal mood and affect. Her behavior is normal.  Nursing note and vitals reviewed.   ED Course  Procedures (including critical care time) Labs Review Labs Reviewed  CBC - Abnormal; Notable for the following:    Hemoglobin 10.3 (*)    HCT 32.8 (*)    RDW 16.5 (*)    Platelets 458 (*)    All other components within normal limits  COMPREHENSIVE METABOLIC PANEL - Abnormal; Notable for the following:    Creatinine, Ser 1.28 (*)    Albumin 2.9 (*)    GFR calc non Af Amer 50 (*)    GFR  calc Af Amer 58 (*)    All other components within normal limits  URINE CULTURE  LIPASE, BLOOD  URINALYSIS, ROUTINE W REFLEX MICROSCOPIC  I-STAT TROPOININ, ED  POC URINE PREG, ED    Imaging Review Dg Chest 2 View  04/11/2014   CLINICAL DATA:  44 year old female with chest congestion chest pain and abdominal pain for 3 days. Initial encounter.  EXAM: CHEST  2 VIEW  COMPARISON:  Chest CTA 03/31/2014 and earlier.  FINDINGS: Stable lung volumes. Normal cardiac size and mediastinal contours. Visualized tracheal air column is within normal limits. No pneumothorax or pulmonary edema. No pleural effusion or consolidation. Chronic hypo ventilation in the lingula appears not significantly changed. No acute pulmonary opacity. No acute osseous abnormality identified. No pneumoperitoneum. Stable cholecystectomy clips.  IMPRESSION: No acute cardiopulmonary abnormality.   Electronically Signed   By: Genevie Ann M.D.   On: 04/11/2014 13:07     EKG Interpretation   Date/Time:   Tuesday April 11 2014 10:56:14 EST Ventricular Rate:  75 PR Interval:  173 QRS Duration: 70 QT Interval:  400 QTC Calculation: 447 R Axis:   44 Text Interpretation:  Sinus rhythm Low voltage, precordial leads Abnormal  R-wave progression, early transition Minimal ST elevation, inferior leads  Baseline wander in lead(s) I II aVR aVL V2 Since last tracing rate slower  diffuse ST elevation - consider pericarditis Abnormal ekg Confirmed by  MILLER  MD, BRIAN ([id number]) on 04/11/2014 11:24:05 AM      Filed Vitals:   04/11/14 1100 04/11/14 1115 04/11/14 1250 04/11/14 1417  BP: 122/63 110/68 119/77 111/64  Pulse: 74 69 70 72  Temp:      TempSrc:      Resp: 18 20 18 24   Height:      Weight:      SpO2: 100% 97% 98% 98%     MDM   Meds given in ED:  Medications  sodium chloride 0.9 % bolus 1,000 mL (0 mLs Intravenous Stopped 04/11/14 1303)  ondansetron (ZOFRAN) injection 4 mg (4 mg Intravenous Given 04/11/14 1133)    New Prescriptions   HYDROCODONE-ACETAMINOPHEN (NORCO/VICODIN) 5-325 MG PER TABLET    Take 1 tablet by mouth every 4 (four) hours as needed.   METHOCARBAMOL (ROBAXIN) 500 MG TABLET    Take 1 tablet (500 mg total) by mouth 2 (two) times daily as needed for muscle spasms.   ONDANSETRON (ZOFRAN ODT) 4 MG DISINTEGRATING TABLET    Take 1 tablet (4 mg total) by mouth every 8 (eight) hours as needed for nausea or vomiting.    Final diagnoses:  Non-intractable vomiting with nausea, vomiting of unspecified type  Atypical chest pain   RAYLYN CARTON is a 44 y.o. female with history of chronic kidney disease who presents the emergency department complaining of chest pain abdominal pain associated with nausea and vomiting ongoing for the past 3 days. The patient was recently admitted at [facility] for pyelonephritis on her right side. She reports she was discharged 3 days ago. Patient has continued to have right flank and right sided abdominal pain. She reports having this pain  for weeks. She reports that 2 days ago she started having left flank pain as well as worsening nausea and vomiting and substernal chest pain. She reports this chest pain is worse at night and feels like acid reflux. She reports lots of belching.  Patient is afebrile and nontoxic appearing. The patient has moderate right sided abdominal tenderness palpation. No rebound tenderness. She  also has mild bilateral flank tenderness.  Her chest is nontender to palpation. She is a negative urine pregnancy test. Her urinalysis is within normal limits and negative for infection. She has a negative troponin. She has no leukocytosis. Her hemoglobin has improved from 8 days ago. CMP shows a creatinine of 1.28 which is unchanged from 8 days ago. She has a history of chronic kidney disease. I advised the patient she needs to follow up with her PCP to review her kidney function. Her lipase is normal. CXR is negative.  At reevaluation the patient reports feeling much better and ready to be discharged after fluid bolus and Zofran. She says her abdominal pain has resolved and she is feeling very hungry and wants to eat something. I reexamined her belly and she is nontender. She is given water and crackers which she tolerated prior to discharge. I advised the patient to follow-up with their primary care provider this week. I advised the patient to return to the emergency department with new or worsening symptoms or new concerns. The patient verbalized understanding and agreement with plan.   This patient was discussed with Dr. Sabra Heck who agrees with assessment and plan.     Waynetta Pean, PA-C 04/11/14 1548  Noemi Chapel, MD 04/11/14 [phone]

## 2014-04-13 LAB — URINE CULTURE

## 2014-04-24 ENCOUNTER — Encounter (HOSPITAL_COMMUNITY): Payer: Self-pay | Admitting: Emergency Medicine

## 2014-04-24 ENCOUNTER — Emergency Department (HOSPITAL_COMMUNITY)
Admission: EM | Admit: 2014-04-24 | Discharge: 2014-04-24 | Disposition: A | Discharge status: Home / Self Care | Payer: No Typology Code available for payment source | Attending: Emergency Medicine | Admitting: Emergency Medicine

## 2014-04-24 DIAGNOSIS — Z862 Personal history of diseases of the blood and blood-forming organs and certain disorders involving the immune mechanism: Secondary | ICD-10-CM | POA: Insufficient documentation

## 2014-04-24 DIAGNOSIS — Z8739 Personal history of other diseases of the musculoskeletal system and connective tissue: Secondary | ICD-10-CM | POA: Insufficient documentation

## 2014-04-24 DIAGNOSIS — G43909 Migraine, unspecified, not intractable, without status migrainosus: Secondary | ICD-10-CM | POA: Diagnosis not present

## 2014-04-24 DIAGNOSIS — G8929 Other chronic pain: Secondary | ICD-10-CM | POA: Diagnosis not present

## 2014-04-24 DIAGNOSIS — N183 Chronic kidney disease, stage 3 (moderate): Secondary | ICD-10-CM | POA: Diagnosis not present

## 2014-04-24 DIAGNOSIS — Z8541 Personal history of malignant neoplasm of cervix uteri: Secondary | ICD-10-CM | POA: Diagnosis not present

## 2014-04-24 DIAGNOSIS — Z79899 Other long term (current) drug therapy: Secondary | ICD-10-CM | POA: Diagnosis not present

## 2014-04-24 DIAGNOSIS — Z792 Long term (current) use of antibiotics: Secondary | ICD-10-CM | POA: Insufficient documentation

## 2014-04-24 DIAGNOSIS — Z3202 Encounter for pregnancy test, result negative: Secondary | ICD-10-CM | POA: Insufficient documentation

## 2014-04-24 DIAGNOSIS — R1031 Right lower quadrant pain: Secondary | ICD-10-CM | POA: Insufficient documentation

## 2014-04-24 DIAGNOSIS — R109 Unspecified abdominal pain: Secondary | ICD-10-CM

## 2014-04-24 LAB — CBC WITH DIFFERENTIAL/PLATELET
Basophils Absolute: 0 10*3/uL (ref 0.0–0.1)
Basophils Relative: 1 % (ref 0–1)
EOS PCT: 3 % (ref 0–5)
Eosinophils Absolute: 0.1 10*3/uL (ref 0.0–0.7)
HCT: 35.9 % — ABNORMAL LOW (ref 36.0–46.0)
Hemoglobin: 11.3 g/dL — ABNORMAL LOW (ref 12.0–15.0)
LYMPHS ABS: 1.5 10*3/uL (ref 0.7–4.0)
LYMPHS PCT: 30 % (ref 12–46)
MCH: 26.4 pg (ref 26.0–34.0)
MCHC: 31.5 g/dL (ref 30.0–36.0)
MCV: 83.9 fL (ref 78.0–100.0)
MONOS PCT: 12 % (ref 3–12)
Monocytes Absolute: 0.6 10*3/uL (ref 0.1–1.0)
NEUTROS PCT: 54 % (ref 43–77)
Neutro Abs: 2.8 10*3/uL (ref 1.7–7.7)
PLATELETS: 231 10*3/uL (ref 150–400)
RBC: 4.28 MIL/uL (ref 3.87–5.11)
RDW: 15.8 % — ABNORMAL HIGH (ref 11.5–15.5)
WBC: 5.1 10*3/uL (ref 4.0–10.5)

## 2014-04-24 LAB — COMPREHENSIVE METABOLIC PANEL
ALBUMIN: 3.4 g/dL — AB (ref 3.5–5.2)
ALK PHOS: 87 U/L (ref 39–117)
ALT: 13 U/L (ref 0–35)
ANION GAP: 4 — AB (ref 5–15)
AST: 18 U/L (ref 0–37)
BUN: 11 mg/dL (ref 6–23)
CO2: 25 mmol/L (ref 19–32)
CREATININE: 1.43 mg/dL — AB (ref 0.50–1.10)
Calcium: 8.1 mg/dL — ABNORMAL LOW (ref 8.4–10.5)
Chloride: 106 mmol/L (ref 96–112)
GFR calc Af Amer: 50 mL/min — ABNORMAL LOW (ref >90)
GFR calc non Af Amer: 43 mL/min — ABNORMAL LOW (ref >90)
Glucose, Bld: 106 mg/dL — ABNORMAL HIGH (ref 70–99)
Potassium: 4.3 mmol/L (ref 3.5–5.1)
Sodium: 135 mmol/L (ref 135–145)
Total Bilirubin: 0.4 mg/dL (ref 0.3–1.2)
Total Protein: 6.9 g/dL (ref 6.0–8.3)

## 2014-04-24 LAB — URINALYSIS, ROUTINE W REFLEX MICROSCOPIC
BILIRUBIN URINE: NEGATIVE
Glucose, UA: NEGATIVE mg/dL
Ketones, ur: NEGATIVE mg/dL
Nitrite: NEGATIVE
PH: 7 (ref 5.0–8.0)
Protein, ur: NEGATIVE mg/dL
SPECIFIC GRAVITY, URINE: 1.015 (ref 1.005–1.030)
Urobilinogen, UA: 0.2 mg/dL (ref 0.0–1.0)

## 2014-04-24 LAB — URINE MICROSCOPIC-ADD ON

## 2014-04-24 LAB — POC URINE PREG, ED: Preg Test, Ur: NEGATIVE

## 2014-04-24 MED ORDER — HYDROCODONE-ACETAMINOPHEN 5-325 MG PO TABS: 2.0000 | ORAL_TABLET | ORAL | Status: DC | PRN

## 2014-04-24 MED ORDER — ONDANSETRON HCL 4 MG/2ML IJ SOLN
4.0000 mg | Freq: Once | INTRAMUSCULAR | Status: AC
  Administered 2014-04-24: 4 mg via INTRAVENOUS
  Filled 2014-04-24: qty 2

## 2014-04-24 MED ORDER — SODIUM CHLORIDE 0.9 % IV BOLUS (SEPSIS)
1000.0000 mL | Freq: Once | INTRAVENOUS | Status: AC
  Administered 2014-04-24: 1000 mL via INTRAVENOUS

## 2014-04-24 MED ORDER — METHOCARBAMOL 500 MG PO TABS: 500.0000 mg | ORAL_TABLET | Freq: Two times a day (BID) | ORAL | Status: DC

## 2014-04-24 MED ORDER — ONDANSETRON HCL 4 MG PO TABS: 4.0000 mg | ORAL_TABLET | Freq: Four times a day (QID) | ORAL | Status: DC

## 2014-04-24 MED ORDER — MORPHINE SULFATE 4 MG/ML IJ SOLN
6.0000 mg | Freq: Once | INTRAMUSCULAR | Status: AC
  Administered 2014-04-24: 6 mg via INTRAVENOUS
  Filled 2014-04-24: qty 2

## 2014-04-24 NOTE — Discharge Instructions (Signed)
Chronic Kidney Disease °Chronic kidney disease occurs when the kidneys are damaged over a long period. The kidneys are two organs that lie on either side of the spine between the middle of the back and the front of the abdomen. The kidneys:  °· Remove wastes and extra water from the blood.   °· Produce important hormones. These help keep bones strong, regulate blood pressure, and help create red blood cells.   °· Balance the fluids and chemicals in the blood and tissues. °A small amount of kidney damage may not cause problems, but a large amount of damage may make it difficult or impossible for the kidneys to work the way they should. If steps are not taken to slow down the kidney damage or stop it from getting worse, the kidneys may stop working permanently. Most of the time, chronic kidney disease does not go away. However, it can often be controlled, and those with the disease can usually live normal lives. °CAUSES  °The most common causes of chronic kidney disease are diabetes and high blood pressure (hypertension). Chronic kidney disease may also be caused by:  °· Diseases that cause the kidneys' filters to become inflamed.   °· Diseases that affect the immune system.   °· Genetic diseases.   °· Medicines that damage the kidneys, such as anti-inflammatory medicines.   °· Poisoning or exposure to toxic substances.   °· A reoccurring kidney or urinary infection.   °· A problem with urine flow. This may be caused by:   °¨ Cancer.   °¨ Kidney stones.   °¨ An enlarged prostate in males. °SIGNS AND SYMPTOMS  °Because the kidney damage in chronic kidney disease occurs slowly, symptoms develop slowly and may not be obvious until the kidney damage becomes severe. A person may have a kidney disease for years without showing any symptoms. Symptoms can include:  °· Swelling (edema) of the legs, ankles, or feet.   °· Tiredness (lethargy).   °· Nausea or vomiting.   °· Confusion.   °· Problems with urination, such as:    °¨ Decreased urine production.   °¨ Frequent urination, especially at night.   °¨ Frequent accidents in children who are potty trained.   °· Muscle twitches and cramps.   °· Shortness of breath.  °· Weakness.   °· Persistent itchiness.   °· Loss of appetite. °· Metallic taste in the mouth. °· Trouble sleeping. °· Slowed development in children. °· Short stature in children. °DIAGNOSIS  °Chronic kidney disease may be detected and diagnosed by tests, including blood, urine, imaging, or kidney biopsy tests.  °TREATMENT  °Most chronic kidney diseases cannot be cured. Treatment usually involves relieving symptoms and preventing or slowing the progression of the disease. Treatment may include:  °· A special diet. You may need to avoid alcohol and foods that are salty and high in potassium.   °· Medicines. These may:   °¨ Lower blood pressure.   °¨ Relieve anemia.   °¨ Relieve swelling.   °¨ Protect the bones. °HOME CARE INSTRUCTIONS  °· Follow your prescribed diet.   °· Take medicines only as directed by your health care provider. Do not take any new medicines (prescription, over-the-counter, or nutritional supplements) unless approved by your health care provider. Many medicines can worsen your kidney damage or need to have the dose adjusted.   °· Quit smoking if you smoke. Talk to your health care provider about a smoking cessation program.   °· Keep all follow-up visits as directed by your health care provider. °SEEK IMMEDIATE MEDICAL CARE IF: °· Your symptoms get worse or you develop new symptoms.   °· You develop symptoms of end-stage kidney disease. These   include:   Headaches.   Abnormally dark or light skin.   Numbness in the hands or feet.   Easy bruising.   Frequent hiccups.   Menstruation stops.   You have a fever.   You have decreased urine production.   You havepain or bleeding when urinating. MAKE SURE YOU:  Understand these instructions.  Will watch your condition.  Will  get help right away if you are not doing well or get worse. FOR MORE INFORMATION   American Association of Kidney Patients: BombTimer.gl  National Kidney Foundation: www.kidney.[place]: https://mathis.com/  [facility]: www.lifeoptions.org and www.kidneyschool.org Document Released: 10/30/2007 Document Revised: 06/06/2013 Document Reviewed: 09/19/2011 [facility] Patient Information 2015 [place], [place]. This information is not intended to replace advice given to you by your health care provider. Make sure you discuss any questions you have with your health care provider.   Flank Pain Flank pain refers to pain that is located on the side of the body between the upper abdomen and the back. The pain may occur over a short period of time (acute) or may be long-term or reoccurring (chronic). It may be mild or severe. Flank pain can be caused by many things. CAUSES  Some of the more common causes of flank pain include:  Muscle strains.   Muscle spasms.   A disease of your spine (vertebral disk disease).   A lung infection (pneumonia).   Fluid around your lungs (pulmonary edema).   A kidney infection.   Kidney stones.   A very painful skin rash caused by the chickenpox virus (shingles).   Gallbladder disease.  [place] care will depend on the cause of your pain. In general,  Rest as directed by your caregiver.  Drink enough fluids to keep your urine clear or pale yellow.  Only take over-the-counter or prescription medicines as directed by your caregiver. Some medicines may help relieve the pain.  Tell your caregiver about any changes in your pain.  Follow up with your caregiver as directed. SEEK IMMEDIATE MEDICAL CARE IF:   Your pain is not controlled with medicine.   You have new or worsening symptoms.  Your pain increases.   You have abdominal pain.   You have shortness of breath.   You have  persistent nausea or vomiting.   You have swelling in your abdomen.   You feel faint or pass out.   You have blood in your urine.  You have a fever or persistent symptoms for more than 2-3 days.  You have a fever and your symptoms suddenly get worse. MAKE SURE YOU:   Understand these instructions.  Will watch your condition.  Will get help right away if you are not doing well or get worse. Document Released: 03/13/2005 Document Revised: 10/15/2011 Document Reviewed: 09/04/2011 [facility] Patient Information 2015 [place], [place]. This information is not intended to replace advice given to you by your health care provider. Make sure you discuss any questions you have with your health care provider.

## 2014-04-24 NOTE — ED Provider Notes (Signed)
CSN: [id number]     Arrival date & time 04/24/14  2633 History   First MD Initiated Contact with Patient 04/24/14 [id number]     Chief Complaint  Patient presents with  . Flank Pain  . Emesis     (Consider location/radiation/quality/duration/timing/severity/associated sxs/prior Treatment) Patient is a 44 y.o. female presenting with flank pain and vomiting. The history is provided by the patient. No language interpreter was used.  Flank Pain This is a recurrent problem. The current episode started 6 to 12 hours ago. The problem occurs constantly. The problem has not changed since onset.Pertinent negatives include no chest pain, no abdominal pain, no headaches and no shortness of breath. Associated symptoms comments: R flank radiating to RUQ. Marland Kitchen Nothing aggravates the symptoms. Nothing relieves the symptoms. Treatments tried: movement. The treatment provided no relief.  Emesis Associated symptoms: no abdominal pain, no arthralgias, no chills, no diarrhea, no headaches and no sore throat     Past Medical History  Diagnosis Date  . Left knee pain   . Migraine   . Cancer     cervical cx at age 77  . Cervical cancer   . Chronic back pain   . Sciatica   . Anemia   . Chronic kidney disease    Past Surgical History  Procedure Laterality Date  . Tubal ligation    . Cholecystectomy N/A 01/20/2013    Procedure: LAPAROSCOPIC CHOLECYSTECTOMY WITH INTRAOPERATIVE CHOLANGIOGRAM;  Surgeon: Shann Medal, MD;  Location: [facility];  Service: General;  Laterality: N/A;   Family History  Problem Relation Age of Onset  . Cancer Mother   . Colon cancer Mother   . Breast cancer Maternal Grandmother   . Breast cancer Paternal Grandmother    History  Substance Use Topics  . Smoking status: Never Smoker   . Smokeless tobacco: Never Used  . Alcohol Use: Yes   OB History    Gravida Para Term Preterm AB TAB SAB Ectopic Multiple Living   10 7 6 1      7      Review of Systems  Constitutional: Negative  for fever, chills, diaphoresis, activity change, appetite change and fatigue.  HENT: Negative for congestion, facial swelling, rhinorrhea and sore throat.   Eyes: Negative for photophobia and discharge.  Respiratory: Negative for cough, chest tightness and shortness of breath.   Cardiovascular: Negative for chest pain, palpitations and leg swelling.  Gastrointestinal: Positive for vomiting. Negative for nausea, abdominal pain and diarrhea.  Endocrine: Negative for polydipsia and polyuria.  Genitourinary: Positive for flank pain. Negative for dysuria, frequency, difficulty urinating and pelvic pain.  Musculoskeletal: Negative for back pain, arthralgias, neck pain and neck stiffness.  Skin: Negative for color change and wound.  Allergic/Immunologic: Negative for immunocompromised state.  Neurological: Negative for facial asymmetry, weakness, numbness and headaches.  Hematological: Does not bruise/bleed easily.  Psychiatric/Behavioral: Negative for confusion and agitation.      Allergies  Benadryl and Onion  Home Medications   Prior to Admission medications   Medication Sig Start Date End Date Taking? Authorizing Provider  ondansetron (ZOFRAN ODT) 4 MG disintegrating tablet Take 1 tablet (4 mg total) by mouth every 8 (eight) hours as needed for nausea or vomiting. 04/11/14  Yes Waynetta Pean, PA-C  ciprofloxacin (CIPRO) 500 MG tablet Take 1 tablet (500 mg total) by mouth 2 (two) times daily. Patient not taking: Reported on 04/11/2014 04/03/14   Erline Hau, MD  cyclobenzaprine (FLEXERIL) 10 MG tablet Take 1 tablet (10  mg total) by mouth 2 (two) times daily as needed for muscle spasms. Patient not taking: Reported on 04/11/2014 03/28/14   Fredia Sorrow, MD  HYDROcodone-acetaminophen (NORCO) 5-325 MG per tablet Take 2 tablets by mouth every 4 (four) hours as needed. 04/24/14   Ernestina Patches, MD  ibuprofen (ADVIL,MOTRIN) 600 MG tablet Take 1 tablet (600 mg total) by mouth every 6  (six) hours as needed. Patient not taking: Reported on 04/11/2014 02/13/14   [name], MD  methocarbamol (ROBAXIN) 500 MG tablet Take 1 tablet (500 mg total) by mouth 2 (two) times daily. 04/24/14   Ernestina Patches, MD  ondansetron (ZOFRAN) 4 MG tablet Take 1 tablet (4 mg total) by mouth every 6 (six) hours. 04/24/14   Ernestina Patches, MD   BP 91/50 mmHg  Pulse 69  Temp(Src) 97.8 F (36.6 C) (Oral)  Resp 18  SpO2 95%  LMP 04/22/2014 Physical Exam  Constitutional: She is oriented to person, place, and time. She appears well-developed and well-nourished. No distress.  HENT:  Head: Normocephalic and atraumatic.  Mouth/Throat: No oropharyngeal exudate.  Eyes: Pupils are equal, round, and reactive to light.  Neck: Normal range of motion. Neck supple.  Cardiovascular: Normal rate, regular rhythm and normal heart sounds.  Exam reveals no gallop and no friction rub.   No murmur heard. Pulmonary/Chest: Effort normal and breath sounds normal. No respiratory distress. She has no wheezes. She has no rales.  Abdominal: Soft. Bowel sounds are normal. She exhibits no distension and no mass. There is no tenderness. There is no rebound and no guarding.  Musculoskeletal: Normal range of motion. She exhibits no edema or tenderness.       Back:  Neurological: She is alert and oriented to person, place, and time.  Skin: Skin is warm and dry.  Psychiatric: She has a normal mood and affect.    ED Course  Procedures (including critical care time) Labs Review Labs Reviewed  CBC WITH DIFFERENTIAL/PLATELET - Abnormal; Notable for the following:    Hemoglobin 11.3 (*)    HCT 35.9 (*)    RDW 15.8 (*)    All other components within normal limits  URINALYSIS, ROUTINE W REFLEX MICROSCOPIC - Abnormal; Notable for the following:    Hgb urine dipstick LARGE (*)    Leukocytes, UA MODERATE (*)    All other components within normal limits  COMPREHENSIVE METABOLIC PANEL - Abnormal; Notable for the following:     Glucose, Bld 106 (*)    Creatinine, Ser 1.43 (*)    Calcium 8.1 (*)    Albumin 3.4 (*)    GFR calc non Af Amer 43 (*)    GFR calc Af Amer 50 (*)    Anion gap 4 (*)    All other components within normal limits  URINE CULTURE  URINE MICROSCOPIC-ADD ON  POC URINE PREG, ED    Imaging Review No results found.   EKG Interpretation None     CLINICAL DATA: Right-sided and central sharp constant chest pain starting today. Patient was seen in the ED 3 days ago with right flank pain. Still with right-sided pain.  EXAM: CT ANGIOGRAPHY CHEST WITH CONTRAST  TECHNIQUE: Multidetector CT imaging of the chest was performed using the standard protocol during bolus administration of intravenous contrast. Multiplanar CT image reconstructions and MIPs were obtained to evaluate the vascular anatomy.  CONTRAST: 141mL OMNIPAQUE IOHEXOL 350 MG/ML SOLN  COMPARISON: CT chest 12/07/2013  FINDINGS: Examination is technically limited due to motion artifact. Moderately good opacification  of the central and proximal segmental pulmonary arteries. No focal filling defects. No evidence of significant central pulmonary embolus. More peripheral vessels are not well visualized.  Normal heart size. Normal caliber thoracic aorta. Accounting for motion artifact, there is no evidence of significant dissection. Great vessel origins are patent. Note of common origin of the carotid arteries. Esophagus is decompressed. No significant lymphadenopathy in the chest.  Evaluation of lungs is limited due to respiratory motion artifact. There is evidence of atelectasis or infiltration in the lung bases. No focal consolidation. No pneumothorax. No pleural effusions. Airways appear patent.  Included portions of the upper abdominal organs are grossly unremarkable. Degenerative changes in the spine. No destructive bone lesions.  Review of the MIP images confirms the above findings.  IMPRESSION: No  evidence of significant pulmonary embolus. Atelectasis or infiltration in the lung bases.   Electronically Signed  By: Lucienne Capers M.D.  On: 03/31/2014 23:05  Study Result     CLINICAL DATA: Right flank pain. Cervical cancer.  EXAM: CT ABDOMEN AND PELVIS WITHOUT CONTRAST  TECHNIQUE: Multidetector CT imaging of the abdomen and pelvis was performed following the standard protocol without IV contrast.  COMPARISON: CT abdomen 01/17/2014  FINDINGS: Lung bases are clear  Liver and spleen are normal. Gallbladder is been removed. Bile ducts nondilated. Pancreas is normal  Normal kidneys. No renal obstruction or mass. No renal calculi. No ureteral or bladder calculus.  Negative for bowel obstruction or bowel thickening. Mild sigmoid diverticulosis. No free fluid or free air. Normal appendix  No skeletal lesion.  IMPRESSION: Negative for renal calculi  Normal appendix  No acute abnormality.   Electronically Signed  By: Franchot Gallo M.D.  On: 03/28/2014 20:47     MDM   Final diagnoses:  Right flank pain  CKD (chronic kidney disease), stage 3 (moderate)    Pt is a 44 y.o. female with Pmhx as above who presents with R sided flank pain with radiation to upper abdomen since 3am. She reports a long hx of back spasms but says this is different.  She has recently also been seen for R sided flank pain & was admitted to pyelo earlier this month. She reports similar pain in the past attributed to CKD. She had a CTA and CT stone study last month which were normal. Urine cultute from 3/8/'16 at time of admission for pyelo reported to have e coli, pt treated with cipro. Sensitives not done as final culture was positive for multiple species. She has not had recent fever, chills, dysuria. She is menstruating. She has had several epsiodes of vomiting. She has run out of pain meds.   Pt feeling much better after 1 dose of pain meds. She is tolerating PO,  abdominal exam is benign.  She has no leukocytosis.  Creatinine is near baseline.  UA has moderate leukocytes but negative nitrites, rare epithelial cells and moderate bacteria.  Urine will be sent for culture.  I believe patient is safe to be discharged home.  I recommended that she follow up with a nephrologist for chronic kidney disease and is well as with the urologist, for her recurrent flank pain.  [place] evaluation in the Emergency Department is complete. It has been determined that no acute conditions requiring further emergency intervention are present at this time. The patient/guardian have been advised of the diagnosis and plan. We have discussed signs and symptoms that warrant return to the ED, such as changes or worsening in symptoms, worsening pain, fever, inability  to tolerate liquids      Ernestina Patches, MD 04/25/14 [id number]

## 2014-04-24 NOTE — ED Notes (Signed)
Pt c/o right flank pain that started around 3 am. Pt states that she has vomited 2 times and only has one Zofran left.  Pt denies any urinary problems, but is on her period so not for sure if she has any blood in her urine or not.

## 2014-04-25 LAB — URINE CULTURE

## 2014-06-11 ENCOUNTER — Encounter (HOSPITAL_COMMUNITY): Payer: Self-pay | Admitting: *Deleted

## 2014-06-11 ENCOUNTER — Emergency Department (HOSPITAL_COMMUNITY)
Admission: EM | Admit: 2014-06-11 | Discharge: 2014-06-11 | Disposition: A | Discharge status: Home / Self Care | Payer: No Typology Code available for payment source | Attending: Emergency Medicine | Admitting: Emergency Medicine

## 2014-06-11 ENCOUNTER — Emergency Department (HOSPITAL_COMMUNITY): Payer: No Typology Code available for payment source

## 2014-06-11 DIAGNOSIS — R0600 Dyspnea, unspecified: Secondary | ICD-10-CM | POA: Diagnosis not present

## 2014-06-11 DIAGNOSIS — Z792 Long term (current) use of antibiotics: Secondary | ICD-10-CM | POA: Insufficient documentation

## 2014-06-11 DIAGNOSIS — N189 Chronic kidney disease, unspecified: Secondary | ICD-10-CM | POA: Diagnosis not present

## 2014-06-11 DIAGNOSIS — G43909 Migraine, unspecified, not intractable, without status migrainosus: Secondary | ICD-10-CM | POA: Diagnosis not present

## 2014-06-11 DIAGNOSIS — F419 Anxiety disorder, unspecified: Secondary | ICD-10-CM | POA: Diagnosis not present

## 2014-06-11 DIAGNOSIS — Z8541 Personal history of malignant neoplasm of cervix uteri: Secondary | ICD-10-CM | POA: Insufficient documentation

## 2014-06-11 DIAGNOSIS — R0602 Shortness of breath: Secondary | ICD-10-CM | POA: Diagnosis not present

## 2014-06-11 DIAGNOSIS — Z7982 Long term (current) use of aspirin: Secondary | ICD-10-CM | POA: Insufficient documentation

## 2014-06-11 DIAGNOSIS — G8929 Other chronic pain: Secondary | ICD-10-CM | POA: Diagnosis not present

## 2014-06-11 DIAGNOSIS — M549 Dorsalgia, unspecified: Secondary | ICD-10-CM | POA: Diagnosis not present

## 2014-06-11 DIAGNOSIS — R079 Chest pain, unspecified: Secondary | ICD-10-CM | POA: Diagnosis present

## 2014-06-11 DIAGNOSIS — Z79899 Other long term (current) drug therapy: Secondary | ICD-10-CM | POA: Diagnosis not present

## 2014-06-11 DIAGNOSIS — Z862 Personal history of diseases of the blood and blood-forming organs and certain disorders involving the immune mechanism: Secondary | ICD-10-CM | POA: Insufficient documentation

## 2014-06-11 LAB — TROPONIN I
Troponin I: <0.03 ng/mL (ref <0.031)
Troponin I: <0.03 ng/mL (ref <0.031)

## 2014-06-11 LAB — CBC
HCT: 34.2 % — ABNORMAL LOW (ref 36.0–46.0)
Hemoglobin: 10.8 g/dL — ABNORMAL LOW (ref 12.0–15.0)
MCH: 26.1 pg (ref 26.0–34.0)
MCHC: 31.6 g/dL (ref 30.0–36.0)
MCV: 82.6 fL (ref 78.0–100.0)
PLATELETS: 356 10*3/uL (ref 150–400)
RBC: 4.14 MIL/uL (ref 3.87–5.11)
RDW: 15.7 % — AB (ref 11.5–15.5)
WBC: 5.6 10*3/uL (ref 4.0–10.5)

## 2014-06-11 LAB — BASIC METABOLIC PANEL
Anion gap: 8 (ref 5–15)
BUN: 12 mg/dL (ref 6–20)
CHLORIDE: 107 mmol/L (ref 101–111)
CO2: 25 mmol/L (ref 22–32)
Calcium: 8.4 mg/dL — ABNORMAL LOW (ref 8.9–10.3)
Creatinine, Ser: 1.25 mg/dL — ABNORMAL HIGH (ref 0.44–1.00)
GFR, EST AFRICAN AMERICAN: 59 mL/min — AB (ref >60)
GFR, EST NON AFRICAN AMERICAN: 51 mL/min — AB (ref >60)
Glucose, Bld: 95 mg/dL (ref 70–99)
POTASSIUM: 3.4 mmol/L — AB (ref 3.5–5.1)
Sodium: 140 mmol/L (ref 135–145)

## 2014-06-11 LAB — BRAIN NATRIURETIC PEPTIDE: B NATRIURETIC PEPTIDE 5: 15 pg/mL (ref 0.0–100.0)

## 2014-06-11 LAB — D-DIMER, QUANTITATIVE: D-Dimer, Quant: 0.72 ug/mL-FEU — ABNORMAL HIGH (ref 0.00–0.48)

## 2014-06-11 MED ORDER — IOHEXOL 350 MG/ML SOLN
100.0000 mL | Freq: Once | INTRAVENOUS | Status: AC | PRN
  Administered 2014-06-11: 100 mL via INTRAVENOUS

## 2014-06-11 MED ORDER — ASPIRIN 81 MG PO CHEW
324.0000 mg | CHEWABLE_TABLET | Freq: Once | ORAL | Status: AC
  Administered 2014-06-11: 324 mg via ORAL
  Filled 2014-06-11: qty 4

## 2014-06-11 MED ORDER — SODIUM CHLORIDE 0.9 % IV BOLUS (SEPSIS)
1000.0000 mL | Freq: Once | INTRAVENOUS | Status: AC
  Administered 2014-06-11: 1000 mL via INTRAVENOUS

## 2014-06-11 MED ORDER — ONDANSETRON HCL 4 MG/2ML IJ SOLN
4.0000 mg | Freq: Once | INTRAMUSCULAR | Status: AC
  Administered 2014-06-11: 4 mg via INTRAVENOUS

## 2014-06-11 MED ORDER — ONDANSETRON HCL 4 MG/2ML IJ SOLN
INTRAMUSCULAR | Status: AC
  Filled 2014-06-11: qty 2

## 2014-06-11 MED ORDER — NITROGLYCERIN 0.4 MG SL SUBL
0.4000 mg | SUBLINGUAL_TABLET | SUBLINGUAL | Status: DC | PRN
Start: 2014-06-11 — End: 2014-06-11
  Administered 2014-06-11: 0.4 mg via SUBLINGUAL
  Filled 2014-06-11: qty 1

## 2014-06-11 MED ORDER — MORPHINE SULFATE 4 MG/ML IJ SOLN
4.0000 mg | Freq: Once | INTRAMUSCULAR | Status: AC
  Administered 2014-06-11: 4 mg via INTRAVENOUS
  Filled 2014-06-11: qty 1

## 2014-06-11 NOTE — ED Notes (Addendum)
Pt states pain to center chest, radiating to her back. States she is unsure if it is from back spasms. Pt is anxious and states that she has been unable to sleep because she wakes up gasping for air and is now afraid to go to sleep. States she woke up at 0200 and "was able to get air in but not out". Pt also states she has been felling as though she is going to pass out. States Low BP all week with SBP of 90.

## 2014-06-11 NOTE — ED Provider Notes (Signed)
CSN: [id number]     Arrival date & time 06/11/14  1328 History   First MD Initiated Contact with Patient 06/11/14 1400     Chief Complaint  Patient presents with  . Chest Pain     (Consider location/radiation/quality/duration/timing/severity/associated sxs/prior Treatment) HPI Comments: 44 year old female with history of anemia, chronic kidney disease, sepsis, obesity, family history of cardiac, nonsmoker presents with chest pain shortness of breath and backache. Patient has a history of back spasms and has left flank pain with palpation and movement since yesterday. Patient has bilateral anterior chest pressure since early this morning at 2:00, worse with lying flat patient feels scared to go to sleep because she had shortness of breath. Patient has had this once before and had a workup done without any acute findings. Patient denies any classic blood clot risk factors.Patient denies blood clot history, active cancer, recent major trauma or surgery, unilateral leg swelling/ pain, recent long travel, hemoptysis or oral contraceptives. Patient is not having known cardiac history. Symptoms intermittently worse with lying flat however mild constant symptoms since 2:00 this morning. No sleep apnea diagnosis known. No exertional or diaphoretic symptoms no nausea.   Patient is a 44 y.o. female presenting with chest pain. The history is provided by the patient.  Chest Pain Associated symptoms: back pain and shortness of breath   Associated symptoms: no abdominal pain, no fever, no headache and not vomiting     Past Medical History  Diagnosis Date  . Left knee pain   . Migraine   . Cancer     cervical cx at age 10  . Cervical cancer   . Chronic back pain   . Sciatica   . Anemia   . Chronic kidney disease    Past Surgical History  Procedure Laterality Date  . Tubal ligation    . Cholecystectomy N/A 01/20/2013    Procedure: LAPAROSCOPIC CHOLECYSTECTOMY WITH INTRAOPERATIVE CHOLANGIOGRAM;   Surgeon: Shann Medal, MD;  Location: [facility];  Service: General;  Laterality: N/A;   Family History  Problem Relation Age of Onset  . Cancer Mother   . Colon cancer Mother   . Breast cancer Maternal Grandmother   . Breast cancer Paternal Grandmother    History  Substance Use Topics  . Smoking status: Never Smoker   . Smokeless tobacco: Never Used  . Alcohol Use: Yes   OB History    Gravida Para Term Preterm AB TAB SAB Ectopic Multiple Living   10 7 6 1      7      Review of Systems  Constitutional: Negative for fever and chills.  HENT: Negative for congestion.   Eyes: Negative for visual disturbance.  Respiratory: Positive for shortness of breath.   Cardiovascular: Positive for chest pain.  Gastrointestinal: Negative for vomiting and abdominal pain.  Genitourinary: Negative for dysuria and flank pain.  Musculoskeletal: Positive for back pain. Negative for neck pain and neck stiffness.  Skin: Negative for rash.  Neurological: Positive for light-headedness. Negative for syncope and headaches.  Psychiatric/Behavioral: The patient is nervous/anxious.       Allergies  Benadryl and Onion  Home Medications   Prior to Admission medications   Medication Sig Start Date End Date Taking? Authorizing Provider  aspirin EC 81 MG tablet Take 81 mg by mouth daily.   Yes Historical Provider, MD  ranitidine (ZANTAC) 150 MG tablet Take 150 mg by mouth 2 (two) times daily.   Yes Historical Provider, MD  ciprofloxacin (CIPRO) 500 MG tablet  Take 1 tablet (500 mg total) by mouth 2 (two) times daily. Patient not taking: Reported on 04/11/2014 04/03/14   Erline Hau, MD  cyclobenzaprine (FLEXERIL) 10 MG tablet Take 1 tablet (10 mg total) by mouth 2 (two) times daily as needed for muscle spasms. Patient not taking: Reported on 04/11/2014 03/28/14   Fredia Sorrow, MD  HYDROcodone-acetaminophen (NORCO) 5-325 MG per tablet Take 2 tablets by mouth every 4 (four) hours as needed. Patient  not taking: Reported on 06/11/2014 04/24/14   Ernestina Patches, MD  ibuprofen (ADVIL,MOTRIN) 600 MG tablet Take 1 tablet (600 mg total) by mouth every 6 (six) hours as needed. Patient not taking: Reported on 04/11/2014 02/13/14   [name], MD  methocarbamol (ROBAXIN) 500 MG tablet Take 1 tablet (500 mg total) by mouth 2 (two) times daily. Patient not taking: Reported on 06/11/2014 04/24/14   Ernestina Patches, MD  ondansetron (ZOFRAN ODT) 4 MG disintegrating tablet Take 1 tablet (4 mg total) by mouth every 8 (eight) hours as needed for nausea or vomiting. Patient not taking: Reported on 06/11/2014 04/11/14   Waynetta Pean, PA-C  ondansetron (ZOFRAN) 4 MG tablet Take 1 tablet (4 mg total) by mouth every 6 (six) hours. Patient not taking: Reported on 06/11/2014 04/24/14   Ernestina Patches, MD   BP 111/83 mmHg  Pulse 75  Temp(Src) 97.6 F (36.4 C) (Oral)  Resp 16  Ht 5\' 6"  (1.676 m)  Wt 263 lb (119.296 kg)  BMI 42.47 kg/m2  SpO2 100%  LMP 05/19/2014 Physical Exam  Constitutional: She is oriented to person, place, and time. She appears well-developed and well-nourished.  HENT:  Head: Normocephalic and atraumatic.  Eyes: Conjunctivae are normal. Right eye exhibits no discharge. Left eye exhibits no discharge.  Neck: Normal range of motion. Neck supple. No tracheal deviation present.  Cardiovascular: Normal rate, regular rhythm and intact distal pulses.   Pulmonary/Chest: Effort normal and breath sounds normal.  Abdominal: Soft. She exhibits no distension. There is no tenderness. There is no guarding.  Musculoskeletal: She exhibits tenderness (left mid flank tender to palpation and movement no midline tenderness.). She exhibits no edema.  Neurological: She is alert and oriented to person, place, and time.  Skin: Skin is warm. No rash noted.  Psychiatric: She has a normal mood and affect.  Nursing note and vitals reviewed.   ED Course  Procedures (including critical care time) Labs Review Labs Reviewed   CBC - Abnormal; Notable for the following:    Hemoglobin 10.8 (*)    HCT 34.2 (*)    RDW 15.7 (*)    All other components within normal limits  BASIC METABOLIC PANEL - Abnormal; Notable for the following:    Potassium 3.4 (*)    Creatinine, Ser 1.25 (*)    Calcium 8.4 (*)    GFR calc non Af Amer 51 (*)    GFR calc Af Amer 59 (*)    All other components within normal limits  D-DIMER, QUANTITATIVE - Abnormal; Notable for the following:    D-Dimer, Quant 0.72 (*)    All other components within normal limits  TROPONIN I  BRAIN NATRIURETIC PEPTIDE  TROPONIN I    Imaging Review Ct Angio Chest Pe W/cm &/or Wo Cm  06/11/2014   CLINICAL DATA:  Central chest pain radiating to the back, weeks of gasping for air.  EXAM: CT ANGIOGRAPHY CHEST WITH CONTRAST  TECHNIQUE: Multidetector CT imaging of the chest was performed using the standard protocol during bolus administration  of intravenous contrast. Multiplanar CT image reconstructions and MIPs were obtained to evaluate the vascular anatomy.  CONTRAST:  166mL OMNIPAQUE IOHEXOL 350 MG/ML SOLN  COMPARISON:  03/31/2014  FINDINGS: Cardiovascular: There is good opacification of the pulmonary arteries. There is no pulmonary embolism. The thoracic aorta is normal in caliber and intact.  Lungs: There is mosaic distribution of mild ground-glass opacity, suggesting scattered air trapping. There is no densely confluent airspace opacity. No mass or nodule.  Central airways: Patent  Effusions: None  Lymphadenopathy: None  Esophagus: Unremarkable  Upper abdomen: No significant abnormality  Musculoskeletal: No significant abnormality  Review of the MIP images confirms the above findings.  IMPRESSION: Negative for acute pulmonary embolism. There are mild scattered ground-glass opacities suggesting air trapping.   Electronically Signed   By: Andreas Newport M.D.   On: 06/11/2014 17:36   Dg Chest Port 1 View  06/11/2014   CLINICAL DATA:  Chest pain and shortness of  breath.  EXAM: PORTABLE CHEST - 1 VIEW  COMPARISON:  04/11/2014.  FINDINGS: Poor inspiration. Normal sized heart. Clear lungs with normal vascularity. Lower thoracic spine degenerative changes.  IMPRESSION: No acute abnormality.   Electronically Signed   By: Claudie Revering M.D.   On: 06/11/2014 14:08     EKG Interpretation   Date/Time:  Sunday Jun 11 2014 13:39:02 EDT Ventricular Rate:  82 PR Interval:  157 QRS Duration: 75 QT Interval:  387 QTC Calculation: 452 R Axis:   40 Text Interpretation:  Sinus rhythm Abnormal R-wave progression, early  transition Minimal ST elevation, inferior leads similar to previous  Confirmed by Delita Chiquito  MD, Dierks Wach ([id number]) on 06/11/2014 2:01:54 PM      MDM   Final diagnoses:  Chest pain, unspecified chest pain type  Dyspnea   Patient presents with intermittent shortness of breath constant chest pressure since early this morning. Patient is low risk for blood clots, d-dimer sent mild positive. CT anginal ordered for further delineation. Vitals normal. No respiratory difficulty on exam.   Patient low risk cardiac, heart score of 2. Plan for delta troponin and close outpatient follow-up. Patient proven ER. CT scan pending CT scan results reviewed no acute findings. Delta troponin negative. Patient improved on recheck. Normal vitals in ER. Discussed continued close follow-up outpatient for further testing with primary doctor. Results and differential diagnosis were discussed with the patient/parent/guardian. Close follow up outpatient was discussed, comfortable with the plan.   Medications  nitroGLYCERIN (NITROSTAT) SL tablet 0.4 mg (0.4 mg Sublingual Given 06/11/14 1513)  ondansetron (ZOFRAN) injection 4 mg (4 mg Intravenous Given 06/11/14 1501)  morphine 4 MG/ML injection 4 mg (4 mg Intravenous Given 06/11/14 1513)  aspirin chewable tablet 324 mg (324 mg Oral Given 06/11/14 1512)  sodium chloride 0.9 % bolus 1,000 mL (1,000 mLs Intravenous New Bag/Given 06/11/14 1631)   iohexol (OMNIPAQUE) 350 MG/ML injection 100 mL (100 mLs Intravenous Contrast Given 06/11/14 1717)    Filed Vitals:   06/11/14 1516 06/11/14 1530 06/11/14 1630 06/11/14 1744  BP: 106/54 111/78 109/90 111/83  Pulse: 93 75 66 75  Temp:    97.6 F (36.4 C)  TempSrc:    Oral  Resp: 22 13 15 16   Height:      Weight:      SpO2: 100% 94% 99% 100%    Final diagnoses:  Chest pain, unspecified chest pain type  Dyspnea      Elnora Morrison, MD 06/11/14 1807

## 2014-06-11 NOTE — ED Notes (Signed)
MD at bedside. 

## 2014-06-11 NOTE — ED Notes (Signed)
Lab to draw D-dimer, previous sample hemolyzed.

## 2014-06-11 NOTE — Discharge Instructions (Signed)
If you were given medicines take as directed.  If you are on coumadin or contraceptives realize their levels and effectiveness is altered by many different medicines.  If you have any reaction (rash, tongues swelling, other) to the medicines stop taking and see a physician.   Please follow up as directed and return to the ER or see a physician for new or worsening symptoms.  Thank you. Filed Vitals:   06/11/14 1434 06/11/14 1500 06/11/14 1516 06/11/14 1530  BP: 105/56 119/74 106/54 111/78  Pulse: 69  93 75  Temp: 97.6 F (36.4 C)     TempSrc: Oral     Resp: 14 34 22 13  Height:      Weight:      SpO2: 100%  100% 94%

## 2014-06-20 ENCOUNTER — Encounter: Payer: Self-pay | Admitting: *Deleted

## 2014-06-28 ENCOUNTER — Encounter: Payer: Self-pay | Admitting: Family Medicine

## 2014-06-28 ENCOUNTER — Ambulatory Visit (INDEPENDENT_AMBULATORY_CARE_PROVIDER_SITE_OTHER): Payer: No Typology Code available for payment source | Admitting: Family Medicine

## 2014-06-28 VITALS — BP 138/78 | HR 72 | Temp 98.3°F | Resp 16 | Ht 66.0 in | Wt 268.0 lb

## 2014-06-28 DIAGNOSIS — N183 Chronic kidney disease, stage 3 unspecified: Secondary | ICD-10-CM

## 2014-06-28 DIAGNOSIS — D649 Anemia, unspecified: Secondary | ICD-10-CM | POA: Diagnosis not present

## 2014-06-28 DIAGNOSIS — Z23 Encounter for immunization: Secondary | ICD-10-CM

## 2014-06-28 DIAGNOSIS — Z0000 Encounter for general adult medical examination without abnormal findings: Secondary | ICD-10-CM | POA: Diagnosis not present

## 2014-06-28 DIAGNOSIS — R7302 Impaired glucose tolerance (oral): Secondary | ICD-10-CM

## 2014-06-28 DIAGNOSIS — M5441 Lumbago with sciatica, right side: Secondary | ICD-10-CM | POA: Diagnosis not present

## 2014-06-28 DIAGNOSIS — Z124 Encounter for screening for malignant neoplasm of cervix: Secondary | ICD-10-CM

## 2014-06-28 DIAGNOSIS — E669 Obesity, unspecified: Secondary | ICD-10-CM

## 2014-06-28 DIAGNOSIS — G629 Polyneuropathy, unspecified: Secondary | ICD-10-CM | POA: Diagnosis not present

## 2014-06-28 LAB — CBC WITH DIFFERENTIAL/PLATELET
Basophils Absolute: 0.1 10*3/uL (ref 0.0–0.1)
Basophils Relative: 1 % (ref 0–1)
EOS PCT: 2 % (ref 0–5)
Eosinophils Absolute: 0.1 10*3/uL (ref 0.0–0.7)
HEMATOCRIT: 36.2 % (ref 36.0–46.0)
HEMOGLOBIN: 11.1 g/dL — AB (ref 12.0–15.0)
LYMPHS ABS: 1.8 10*3/uL (ref 0.7–4.0)
LYMPHS PCT: 29 % (ref 12–46)
MCH: 25.2 pg — ABNORMAL LOW (ref 26.0–34.0)
MCHC: 30.7 g/dL (ref 30.0–36.0)
MCV: 82.1 fL (ref 78.0–100.0)
MONOS PCT: 10 % (ref 3–12)
MPV: 11.1 fL (ref 8.6–12.4)
Monocytes Absolute: 0.6 10*3/uL (ref 0.1–1.0)
NEUTROS ABS: 3.7 10*3/uL (ref 1.7–7.7)
NEUTROS PCT: 58 % (ref 43–77)
Platelets: 306 10*3/uL (ref 150–400)
RBC: 4.41 MIL/uL (ref 3.87–5.11)
RDW: 15.8 % — AB (ref 11.5–15.5)
WBC: 6.3 10*3/uL (ref 4.0–10.5)

## 2014-06-28 LAB — LIPID PANEL
Cholesterol: 175 mg/dL (ref 0–200)
HDL: 43 mg/dL — ABNORMAL LOW (ref >=46)
LDL CALC: 112 mg/dL — AB (ref 0–99)
Total CHOL/HDL Ratio: 4.1 Ratio
Triglycerides: 98 mg/dL (ref <150)
VLDL: 20 mg/dL (ref 0–40)

## 2014-06-28 LAB — COMPLETE METABOLIC PANEL WITH GFR
ALT: 8 U/L (ref 0–35)
AST: 11 U/L (ref 0–37)
Albumin: 3.5 g/dL (ref 3.5–5.2)
Alkaline Phosphatase: 71 U/L (ref 39–117)
BUN: 10 mg/dL (ref 6–23)
CALCIUM: 8.8 mg/dL (ref 8.4–10.5)
CHLORIDE: 103 meq/L (ref 96–112)
CO2: 27 mEq/L (ref 19–32)
CREATININE: 1.04 mg/dL (ref 0.50–1.10)
GFR, EST NON AFRICAN AMERICAN: 65 mL/min
GFR, Est African American: 75 mL/min
GLUCOSE: 95 mg/dL (ref 70–99)
Potassium: 4.3 mEq/L (ref 3.5–5.3)
SODIUM: 140 meq/L (ref 135–145)
TOTAL PROTEIN: 6.7 g/dL (ref 6.0–8.3)
Total Bilirubin: 0.2 mg/dL (ref 0.2–1.2)

## 2014-06-28 LAB — HEMOGLOBIN A1C
Hgb A1c MFr Bld: 6 % — ABNORMAL HIGH (ref <5.7)
Mean Plasma Glucose: 126 mg/dL — ABNORMAL HIGH (ref <117)

## 2014-06-28 LAB — TSH: TSH: 3.328 u[IU]/mL (ref 0.350–4.500)

## 2014-06-28 MED ORDER — METHOCARBAMOL 500 MG PO TABS: 500.0000 mg | ORAL_TABLET | Freq: Two times a day (BID) | ORAL | Status: DC | PRN

## 2014-06-28 MED ORDER — GABAPENTIN 100 MG PO CAPS
100.0000 mg | ORAL_CAPSULE | Freq: Every day | ORAL | Status: DC
Start: 2014-06-28 — End: 2014-07-26

## 2014-06-28 NOTE — Patient Instructions (Signed)
I recommend eye visit once a year I recommend dental visit every 6 months Goal is to  Exercise 30 minutes 5 days a week We will send a letter with lab results  Tetanus Booster given Schedule a Mammogram Start gabapentin 1 capsule at bedtime, increase to 2 capsules in 1 week, up to 3  Continue robaxin Get the xrays done on your spine F/U 4 weeks

## 2014-06-28 NOTE — Assessment & Plan Note (Signed)
Concern for neuropathy due to diabetes, fasting labs today Start gabapentin which should also help back pain and sciatica symptoms

## 2014-06-28 NOTE — Assessment & Plan Note (Signed)
Check A1C concern with her neuropathy that she is diabetic
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
## 2014-06-29 LAB — PAP THINPREP ASCUS RFLX HPV RFLX TYPE

## 2014-06-30 ENCOUNTER — Other Ambulatory Visit: Payer: Self-pay | Admitting: *Deleted

## 2014-06-30 DIAGNOSIS — Z8541 Personal history of malignant neoplasm of cervix uteri: Secondary | ICD-10-CM

## 2014-06-30 DIAGNOSIS — R87613 High grade squamous intraepithelial lesion on cytologic smear of cervix (HGSIL): Secondary | ICD-10-CM

## 2014-07-06 ENCOUNTER — Telehealth: Payer: Self-pay | Admitting: Family Medicine

## 2014-07-06 NOTE — Telephone Encounter (Signed)
Patient requesting speak with nurse - she is having b/l numbness and burning of foot [phone]

## 2014-07-06 NOTE — Telephone Encounter (Signed)
Call placed to patient.   States that she has a lot of burning sensation in feet and slight numbness in BLE.   States that she does feel some relief when she takes the Neurontin, so she has begun taking (1) cap PO BID.  Advised to increase dosage to Neurontin 100mg  (1) cap PO QAM and Neurontin 100mg  (2) caps PO QHS. Advised to call to F/U on 07/10/2014.

## 2014-07-07 ENCOUNTER — Other Ambulatory Visit: Payer: Self-pay

## 2014-07-07 DIAGNOSIS — Z1231 Encounter for screening mammogram for malignant neoplasm of breast: Secondary | ICD-10-CM

## 2014-07-07 NOTE — Telephone Encounter (Signed)
noted 

## 2014-07-11 ENCOUNTER — Ambulatory Visit
Admission: RE | Admit: 2014-07-11 | Discharge: 2014-07-11 | Disposition: A | Discharge status: Home / Self Care | Payer: No Typology Code available for payment source | Source: Ambulatory Visit

## 2014-07-11 DIAGNOSIS — Z1231 Encounter for screening mammogram for malignant neoplasm of breast: Secondary | ICD-10-CM

## 2014-07-26 ENCOUNTER — Encounter: Payer: Self-pay | Admitting: Family Medicine

## 2014-07-26 ENCOUNTER — Ambulatory Visit (INDEPENDENT_AMBULATORY_CARE_PROVIDER_SITE_OTHER): Payer: No Typology Code available for payment source | Admitting: Family Medicine

## 2014-07-26 VITALS — BP 130/66 | HR 78 | Temp 97.4°F | Resp 16 | Ht 66.0 in | Wt 274.0 lb

## 2014-07-26 DIAGNOSIS — G629 Polyneuropathy, unspecified: Secondary | ICD-10-CM | POA: Diagnosis not present

## 2014-07-26 DIAGNOSIS — G8929 Other chronic pain: Secondary | ICD-10-CM

## 2014-07-26 DIAGNOSIS — M549 Dorsalgia, unspecified: Secondary | ICD-10-CM

## 2014-07-26 DIAGNOSIS — K219 Gastro-esophageal reflux disease without esophagitis: Secondary | ICD-10-CM | POA: Diagnosis not present

## 2014-07-26 MED ORDER — GABAPENTIN 300 MG PO CAPS: 300.0000 mg | ORAL_CAPSULE | Freq: Three times a day (TID) | ORAL | Status: DC

## 2014-07-26 MED ORDER — HYDROCODONE-ACETAMINOPHEN 5-325 MG PO TABS: 1.0000 | ORAL_TABLET | Freq: Three times a day (TID) | ORAL | Status: DC | PRN

## 2014-07-26 NOTE — Patient Instructions (Signed)
Increase gabapentin to 1 (300mg ) capsule twice a day  For 1 week, then increase to 1 tablet three times a day  Get the xray one on your spine   Follow-up with GYN   Nerve conduction study to be done  Try the dexilant for acid reflux   Hydrocodone pain medications  F/U 2 months

## 2014-07-27 ENCOUNTER — Ambulatory Visit (HOSPITAL_COMMUNITY)
Admission: RE | Admit: 2014-07-27 | Discharge: 2014-07-27 | Disposition: A | Discharge status: Home / Self Care | Payer: No Typology Code available for payment source | Source: Ambulatory Visit | Attending: Family Medicine | Admitting: Family Medicine

## 2014-07-27 DIAGNOSIS — M5441 Lumbago with sciatica, right side: Secondary | ICD-10-CM | POA: Insufficient documentation

## 2014-07-27 DIAGNOSIS — K219 Gastro-esophageal reflux disease without esophagitis: Secondary | ICD-10-CM | POA: Insufficient documentation

## 2014-07-27 DIAGNOSIS — M549 Dorsalgia, unspecified: Secondary | ICD-10-CM | POA: Insufficient documentation

## 2014-07-27 DIAGNOSIS — G8929 Other chronic pain: Secondary | ICD-10-CM | POA: Insufficient documentation

## 2014-07-27 NOTE — Progress Notes (Signed)
Patient ID: Kristi Martin, female   DOB: 04-29-1970, 44 y.o.   MRN: [id number]   Subjective:    Patient ID: Kristi Martin, female    DOB: [birth year], 44 y.o.   MRN: [id number]  Patient presents for 4 week F/U and Neuropathy  Pt here for intermin follow-up. Started on gabapentin for chronic back pain and severe neuropathy, interestingly enough A1C was not elevated to suggest diabetes more glucose intolerance. Taking neurontin 300mg  at bedtime, she thought this was for her acid reflux so she stopped her zantac as well and has had problems with severe heartburn. Does not feel like the medication helps her pain at all, now has tingling and burning in her hands that started 1 week ago. Note she did not go get xrays of lumbar spine as directed either.    Review Of Systems:  GEN- denies fatigue, fever, weight loss,weakness, recent illness HEENT- denies eye drainage, change in vision, nasal discharge, CVS- denies chest pain, palpitations RESP- denies SOB, cough, wheeze ABD- denies N/V, change in stools, abd pain GU- denies dysuria, hematuria, dribbling, incontinence MSK- + joint pain, muscle aches, injury Neuro- denies headache, dizziness, syncope, seizure activity       Objective:    BP 130/66 mmHg  Pulse 78  Temp(Src) 97.4 F (36.3 C) (Oral)  Resp 16  Ht 5\' 6"  (1.676 m)  Wt 274 lb (124.286 kg)  BMI 44.25 kg/m2  LMP 07/12/2014 (Approximate) GEN- NAD, alert and oriented x3 Neck- Supple, good ROM CVS- RRR, no murmur RESP-CTAB ABD-NABS,soft,NT,ND EXT- No edema Pulses- Radial  2+        Assessment & Plan:      Problem List Items Addressed This Visit    None      Note: This dictation was prepared with Dragon dictation along with smaller phrase technology. Any transcriptional errors that result from this process are unintentional.

## 2014-07-27 NOTE — Assessment & Plan Note (Signed)
Obtain xray of lumbar spine, she is going to need nerve conduction studies done Titrate up gabapentin to 300mg  TID, may consider Lyrica if this does not work Still unknown cause such severe neuropathy which is chronic

## 2014-07-27 NOTE — Assessment & Plan Note (Signed)
Trial of dexilant given samples from office

## 2014-07-27 NOTE — Assessment & Plan Note (Signed)
Xray to be done, continue robaxin, given short term sccript for norco to avoid ER while we work up pain

## 2014-08-04 ENCOUNTER — Ambulatory Visit (INDEPENDENT_AMBULATORY_CARE_PROVIDER_SITE_OTHER): Payer: No Typology Code available for payment source | Admitting: Obstetrics & Gynecology

## 2014-08-04 ENCOUNTER — Telehealth: Payer: Self-pay | Admitting: Family Medicine

## 2014-08-04 ENCOUNTER — Encounter: Payer: Self-pay | Admitting: Obstetrics & Gynecology

## 2014-08-04 ENCOUNTER — Other Ambulatory Visit (HOSPITAL_COMMUNITY)
Admission: RE | Admit: 2014-08-04 | Discharge: 2014-08-04 | Disposition: A | Discharge status: Home / Self Care | Payer: No Typology Code available for payment source | Source: Ambulatory Visit | Attending: Obstetrics & Gynecology | Admitting: Obstetrics & Gynecology

## 2014-08-04 VITALS — BP 102/59 | HR 94 | Temp 97.9°F | Ht 64.0 in | Wt 271.3 lb

## 2014-08-04 DIAGNOSIS — R87613 High grade squamous intraepithelial lesion on cytologic smear of cervix (HGSIL): Secondary | ICD-10-CM | POA: Diagnosis not present

## 2014-08-04 DIAGNOSIS — R87619 Unspecified abnormal cytological findings in specimens from cervix uteri: Secondary | ICD-10-CM | POA: Insufficient documentation

## 2014-08-04 DIAGNOSIS — Z3202 Encounter for pregnancy test, result negative: Secondary | ICD-10-CM

## 2014-08-04 LAB — POCT PREGNANCY, URINE: PREG TEST UR: NEGATIVE

## 2014-08-04 MED ORDER — DEXLANSOPRAZOLE 60 MG PO CPDR: 60.0000 mg | DELAYED_RELEASE_CAPSULE | Freq: Every day | ORAL | Status: DC

## 2014-08-04 NOTE — Telephone Encounter (Signed)
Too early for refill on norco, just given 1 week ago, needs to wait at least 2 more weeks Okay to refill dexilant

## 2014-08-04 NOTE — Telephone Encounter (Signed)
Patient is calling to say that the dexilant samples work well and would like to know if rx can be called in for this, and also needs rx written for her hydrocodone  Please call with any questions to [phone]

## 2014-08-04 NOTE — Telephone Encounter (Signed)
Dexilant refill to pharmacy per protocol.  LRF Hydrocodone 07/26/14  #45.  OK refill??

## 2014-08-04 NOTE — Progress Notes (Signed)
Patient ID: Kristi Martin, female   DOB: 12-Oct-1970, 44 y.o.   MRN: [id number]  Chief Complaint  Patient presents with  . Colposcopy    HPI Kristi Martin is a 44 y.o. female.  [id number] Patient's last menstrual period was 07/12/2014 (approximate).   HPI  Indications: Pap smear on May 2016 showed: high-grade squamous intraepithelial neoplasia  (HGSIL-encompassing moderate and severe dysplasia). Previous colposcopy: unsure result. Prior cervical treatment: cryosurgery.  Past Medical History  Diagnosis Date  . Left knee pain   . Migraine   . Cancer     cervical cx at age 52  . Cervical cancer   . Chronic back pain   . Sciatica   . Anemia   . Chronic kidney disease   . Hypotension   . Vaginal Pap smear, abnormal     Past Surgical History  Procedure Laterality Date  . Tubal ligation    . Cholecystectomy N/A 01/20/2013    Procedure: LAPAROSCOPIC CHOLECYSTECTOMY WITH INTRAOPERATIVE CHOLANGIOGRAM;  Surgeon: Shann Medal, MD;  Location: [facility];  Service: General;  Laterality: N/A;    Family History  Problem Relation Age of Onset  . Cancer Mother   . Colon cancer Mother   . Breast cancer Maternal Grandmother   . Heart disease Maternal Grandmother   . Breast cancer Paternal Grandmother   . Heart disease Paternal Grandmother   . Hypertension Paternal Grandmother   . Alcohol abuse Father   . Drug abuse Father   . Asthma Brother   . Cancer Paternal Grandfather     Social History History  Substance Use Topics  . Smoking status: Never Smoker   . Smokeless tobacco: Never Used  . Alcohol Use: 0.0 oz/week    0 Standard drinks or equivalent per week     Comment: occasionally    Allergies  Allergen Reactions  . Benadryl [Diphenhydramine Hcl] Anaphylaxis and Swelling    Back of throat closes  . Onion Anaphylaxis    Current Outpatient Prescriptions  Medication Sig Dispense Refill  . gabapentin (NEURONTIN) 300 MG capsule Take 1 capsule (300 mg total) by mouth 3  (three) times daily. 90 capsule 3  . methocarbamol (ROBAXIN) 500 MG tablet Take 1 tablet (500 mg total) by mouth 2 (two) times daily as needed for muscle spasms. 60 tablet 3  . ranitidine (ZANTAC) 150 MG capsule Take 150 mg by mouth 2 (two) times daily.    Marland Kitchen dexlansoprazole (DEXILANT) 60 MG capsule Take 60 mg by mouth daily.    Marland Kitchen HYDROcodone-acetaminophen (NORCO/VICODIN) 5-325 MG per tablet Take 1 tablet by mouth 3 (three) times daily as needed for moderate pain. (Patient not taking: Reported on 08/04/2014) 45 tablet 0   No current facility-administered medications for this visit.    Review of Systems Review of Systems  Blood pressure 102/59, pulse 94, temperature 97.9 F (36.6 C), height 5\' 4"  (1.626 m), weight 271 lb 4.8 oz (123.061 kg), last menstrual period 07/12/2014.  Physical Exam Physical Exam  Data Reviewed Note on pap result HSIL  Assessment    Procedure Details  The risks and benefits of the procedure and Written informed consent obtained.  Speculum placed in vagina and excellent visualization of cervix achieved, cervix swabbed x 3 with acetic acid solution. Scarring from cryo, tight os, small AWE at os, endocerv specimen with brush and bx at 1000, monsel's applied Specimens: ECC and Bx  Complications: none.     Plan    Specimens labelled and sent to Pathology. Call  to give result.      ARNOLD,JAMES 08/04/2014, 8:46 AM

## 2014-08-04 NOTE — Telephone Encounter (Signed)
Change to protonix 40mg once a day  

## 2014-08-04 NOTE — Telephone Encounter (Signed)
She had a GYN biopsy this should not require pain meds,  I will give her 30 tabs, must last 30 days

## 2014-08-04 NOTE — Telephone Encounter (Signed)
Says just had some procedure done today and only has two pain pills left??

## 2014-08-04 NOTE — Telephone Encounter (Signed)
Insurance will not pay for Dexilant (PA submitted and denied)  Pt has to have tried and failed omeprazole, Prevacid and Pantoprazole.  Please advise?

## 2014-08-04 NOTE — Patient Instructions (Signed)
Colposcopy Care After Colposcopy is a procedure in which a special tool is used to magnify the surface of the cervix. A tissue sample (biopsy) may also be taken. This sample will be looked at for cervical cancer or other problems. After the test:  You may have some cramping.  Lie down for a few minutes if you feel lightheaded.   You may have some bleeding which should stop in a few days. HOME CARE  Do not have sex or use tampons for 2 to 3 days or as told.  Only take medicine as told by your doctor.  Continue to take your birth control pills as usual. Finding out the results of your test Ask when your test results will be ready. Make sure you get your test results. GET HELP RIGHT AWAY IF:  You are bleeding a lot or are passing blood clots.  You develop a fever of 102 F (38.9 C) or higher.  You have abnormal vaginal discharge.  You have cramps that do not go away with medicine.  You feel lightheaded, dizzy, or pass out (faint). MAKE SURE YOU:   Understand these instructions.  Will watch your condition.  Will get help right away if you are not doing well or get worse. Document Released: 07/09/2007 Document Revised: 04/14/2011 Document Reviewed: 08/19/2012 [facility] Patient Information 2015 [place], [place]. This information is not intended to replace advice given to you by your health care provider. Make sure you discuss any questions you have with your health care provider.

## 2014-08-08 ENCOUNTER — Encounter (HOSPITAL_COMMUNITY): Payer: Self-pay | Admitting: Emergency Medicine

## 2014-08-08 ENCOUNTER — Emergency Department (HOSPITAL_COMMUNITY)
Admission: EM | Admit: 2014-08-08 | Discharge: 2014-08-08 | Disposition: A | Discharge status: Home / Self Care | Payer: No Typology Code available for payment source | Attending: Emergency Medicine | Admitting: Emergency Medicine

## 2014-08-08 ENCOUNTER — Telehealth: Payer: Self-pay | Admitting: *Deleted

## 2014-08-08 DIAGNOSIS — G43909 Migraine, unspecified, not intractable, without status migrainosus: Secondary | ICD-10-CM | POA: Diagnosis not present

## 2014-08-08 DIAGNOSIS — Z8739 Personal history of other diseases of the musculoskeletal system and connective tissue: Secondary | ICD-10-CM | POA: Insufficient documentation

## 2014-08-08 DIAGNOSIS — N9989 Other postprocedural complications and disorders of genitourinary system: Secondary | ICD-10-CM | POA: Insufficient documentation

## 2014-08-08 DIAGNOSIS — Y838 Other surgical procedures as the cause of abnormal reaction of the patient, or of later complication, without mention of misadventure at the time of the procedure: Secondary | ICD-10-CM | POA: Insufficient documentation

## 2014-08-08 DIAGNOSIS — Z862 Personal history of diseases of the blood and blood-forming organs and certain disorders involving the immune mechanism: Secondary | ICD-10-CM | POA: Insufficient documentation

## 2014-08-08 DIAGNOSIS — N189 Chronic kidney disease, unspecified: Secondary | ICD-10-CM | POA: Diagnosis not present

## 2014-08-08 DIAGNOSIS — N939 Abnormal uterine and vaginal bleeding, unspecified: Secondary | ICD-10-CM

## 2014-08-08 DIAGNOSIS — E669 Obesity, unspecified: Secondary | ICD-10-CM | POA: Insufficient documentation

## 2014-08-08 DIAGNOSIS — Z8541 Personal history of malignant neoplasm of cervix uteri: Secondary | ICD-10-CM | POA: Insufficient documentation

## 2014-08-08 DIAGNOSIS — G8929 Other chronic pain: Secondary | ICD-10-CM | POA: Diagnosis not present

## 2014-08-08 DIAGNOSIS — Z79899 Other long term (current) drug therapy: Secondary | ICD-10-CM | POA: Diagnosis not present

## 2014-08-08 LAB — CBC WITH DIFFERENTIAL/PLATELET
BASOS PCT: 1 % (ref 0–1)
Basophils Absolute: 0 10*3/uL (ref 0.0–0.1)
Eosinophils Absolute: 0.1 10*3/uL (ref 0.0–0.7)
Eosinophils Relative: 2 % (ref 0–5)
HEMATOCRIT: 35.3 % — AB (ref 36.0–46.0)
Hemoglobin: 10.9 g/dL — ABNORMAL LOW (ref 12.0–15.0)
LYMPHS PCT: 28 % (ref 12–46)
Lymphs Abs: 1.7 10*3/uL (ref 0.7–4.0)
MCH: 25.3 pg — AB (ref 26.0–34.0)
MCHC: 30.9 g/dL (ref 30.0–36.0)
MCV: 81.9 fL (ref 78.0–100.0)
MONO ABS: 0.5 10*3/uL (ref 0.1–1.0)
Monocytes Relative: 9 % (ref 3–12)
Neutro Abs: 3.7 10*3/uL (ref 1.7–7.7)
Neutrophils Relative %: 60 % (ref 43–77)
Platelets: 290 10*3/uL (ref 150–400)
RBC: 4.31 MIL/uL (ref 3.87–5.11)
RDW: 15.9 % — AB (ref 11.5–15.5)
WBC: 6.1 10*3/uL (ref 4.0–10.5)

## 2014-08-08 LAB — COMPREHENSIVE METABOLIC PANEL
ALBUMIN: 2.9 g/dL — AB (ref 3.5–5.0)
ALT: 9 U/L — AB (ref 14–54)
AST: 14 U/L — ABNORMAL LOW (ref 15–41)
Alkaline Phosphatase: 63 U/L (ref 38–126)
Anion gap: 6 (ref 5–15)
BUN: 12 mg/dL (ref 6–20)
CALCIUM: 8.2 mg/dL — AB (ref 8.9–10.3)
CO2: 24 mmol/L (ref 22–32)
Chloride: 108 mmol/L (ref 101–111)
Creatinine, Ser: 1.24 mg/dL — ABNORMAL HIGH (ref 0.44–1.00)
GFR calc Af Amer: 60 mL/min — ABNORMAL LOW (ref >60)
GFR calc non Af Amer: 52 mL/min — ABNORMAL LOW (ref >60)
Glucose, Bld: 120 mg/dL — ABNORMAL HIGH (ref 65–99)
Potassium: 3.9 mmol/L (ref 3.5–5.1)
Sodium: 138 mmol/L (ref 135–145)
Total Bilirubin: 0.3 mg/dL (ref 0.3–1.2)
Total Protein: 6.3 g/dL — ABNORMAL LOW (ref 6.5–8.1)

## 2014-08-08 MED ORDER — ONDANSETRON HCL 4 MG/2ML IJ SOLN
4.0000 mg | Freq: Once | INTRAMUSCULAR | Status: AC
  Administered 2014-08-08: 4 mg via INTRAVENOUS
  Filled 2014-08-08: qty 2

## 2014-08-08 MED ORDER — HYDROMORPHONE HCL 1 MG/ML IJ SOLN
1.0000 mg | Freq: Once | INTRAMUSCULAR | Status: AC
  Administered 2014-08-08: 1 mg via INTRAVENOUS
  Filled 2014-08-08: qty 1

## 2014-08-08 MED ORDER — SODIUM CHLORIDE 0.9 % IV BOLUS (SEPSIS)
500.0000 mL | Freq: Once | INTRAVENOUS | Status: AC
  Administered 2014-08-08: 500 mL via INTRAVENOUS

## 2014-08-08 MED ORDER — OXYCODONE-ACETAMINOPHEN 5-325 MG PO TABS: 1.0000 | ORAL_TABLET | Freq: Four times a day (QID) | ORAL | Status: DC | PRN

## 2014-08-08 MED ORDER — PANTOPRAZOLE SODIUM 40 MG PO TBEC: 40.0000 mg | DELAYED_RELEASE_TABLET | Freq: Every day | ORAL | Status: DC

## 2014-08-08 MED ORDER — ONDANSETRON 4 MG PO TBDP: 4.0000 mg | ORAL_TABLET | Freq: Once | ORAL | Status: DC

## 2014-08-08 MED ORDER — KETOROLAC TROMETHAMINE 30 MG/ML IJ SOLN
30.0000 mg | Freq: Once | INTRAMUSCULAR | Status: AC
  Administered 2014-08-08: 30 mg via INTRAVENOUS
  Filled 2014-08-08: qty 1

## 2014-08-08 NOTE — Telephone Encounter (Signed)
Pt made aware Dexilant not covered by insurance.  Provider wants tom change to Pantoprazole 40 mg.  Rx to pharmacy.  Told pt to let us know how that works.  Is still complaining about pain and bleeding from GYN procedure last week.  Told her she need to call her GYN to take care of that.

## 2014-08-08 NOTE — Telephone Encounter (Signed)
Pt contacted clinic stating she is experiencing heavy bright,red bleeding with extreme cramps.  Advised patient that is appears it may appears to be her period, pt states her period does not start until 7/8.  Advised patient if she is saturating a pad within one hour that she needs to be evaluated at [facility], pt verbalizes understanding.

## 2014-08-08 NOTE — ED Notes (Signed)
Pt alert & oriented x4, stable gait. Patient given discharge instructions, paperwork & prescription(s). Patient  instructed to stop at the registration desk to finish any additional paperwork. Patient verbalized understanding. Pt left department w/ no further questions. 

## 2014-08-08 NOTE — Discharge Instructions (Signed)
Call your ob-gyn md this week and give the doctor and update on how you are doing

## 2014-08-08 NOTE — ED Notes (Signed)
PT had Colposcopy procedure for cervical cancer on 08/04/14 and states lower abdominal pain with heavy vaginal bleeding x1 day.

## 2014-08-08 NOTE — ED Provider Notes (Signed)
History  This chart was scribed for Milton Ferguson, MD by Marlowe Kays, ED Scribe. This patient was seen in room APA10/APA10 and the patient's care was started at 12:51 PM.  Chief Complaint  Patient presents with  . Vaginal Bleeding   Patient is a 44 y.o. female presenting with vaginal bleeding. The history is provided by the patient and medical records (the pt had colposcopy with surgery to cervix.  she is bleeding now with pain). No language interpreter was used.  Vaginal Bleeding Quality:  Dark red Severity:  Moderate Onset quality:  Sudden Timing:  Intermittent Chronicity:  Recurrent Associated symptoms: abdominal pain   Associated symptoms: no back pain, no fatigue and no nausea     HPI Comments:  Kristi Martin is a 44 y.o. obese female who presents to the Emergency Department complaining of severe vaginal bleeding that began Friday secondary to having a colposcopy done. She reports associated severe lower abdomen cramping pain. She has not taken anything for her pain. Touching the area makes the pain worse. She denies nausea, vomiting, fever or chills.   Past Medical History  Diagnosis Date  . Left knee pain   . Migraine   . Cancer     cervical cx at age 86  . Cervical cancer   . Chronic back pain   . Sciatica   . Anemia   . Chronic kidney disease   . Hypotension   . Vaginal Pap smear, abnormal    Past Surgical History  Procedure Laterality Date  . Tubal ligation    . Cholecystectomy N/A 01/20/2013    Procedure: LAPAROSCOPIC CHOLECYSTECTOMY WITH INTRAOPERATIVE CHOLANGIOGRAM;  Surgeon: Shann Medal, MD;  Location: [facility];  Service: General;  Laterality: N/A;   Family History  Problem Relation Age of Onset  . Cancer Mother   . Colon cancer Mother   . Breast cancer Maternal Grandmother   . Heart disease Maternal Grandmother   . Breast cancer Paternal Grandmother   . Heart disease Paternal Grandmother   . Hypertension Paternal Grandmother   . Alcohol abuse  Father   . Drug abuse Father   . Asthma Brother   . Cancer Paternal Grandfather    History  Substance Use Topics  . Smoking status: Never Smoker   . Smokeless tobacco: Never Used  . Alcohol Use: 0.0 oz/week    0 Standard drinks or equivalent per week     Comment: occasionally   OB History    Gravida Para Term Preterm AB TAB SAB Ectopic Multiple Living   9 7 5 2 2  2   7      Review of Systems  Constitutional: Negative for chills, appetite change and fatigue.  HENT: Negative for congestion, ear discharge and sinus pressure.   Eyes: Negative for discharge.  Respiratory: Negative for cough.   Cardiovascular: Negative for chest pain.  Gastrointestinal: Positive for abdominal pain. Negative for nausea, vomiting and diarrhea.  Genitourinary: Positive for vaginal bleeding. Negative for frequency and hematuria.  Musculoskeletal: Negative for back pain.  Skin: Negative for rash.  Neurological: Negative for seizures and headaches.  Psychiatric/Behavioral: Negative for hallucinations.    Allergies  Benadryl and Onion  Home Medications   Prior to Admission medications   Medication Sig Start Date End Date Taking? Authorizing Provider  gabapentin (NEURONTIN) 300 MG capsule Take 1 capsule (300 mg total) by mouth 3 (three) times daily. 07/26/14   Alycia Rossetti, MD  HYDROcodone-acetaminophen (NORCO/VICODIN) 5-325 MG per tablet Take 1 tablet  by mouth 3 (three) times daily as needed for moderate pain. Patient not taking: Reported on 08/04/2014 07/26/14   Alycia Rossetti, MD  methocarbamol (ROBAXIN) 500 MG tablet Take 1 tablet (500 mg total) by mouth 2 (two) times daily as needed for muscle spasms. 06/28/14   Alycia Rossetti, MD  pantoprazole (PROTONIX) 40 MG tablet Take 1 tablet (40 mg total) by mouth daily. 08/08/14   Alycia Rossetti, MD  ranitidine (ZANTAC) 150 MG capsule Take 150 mg by mouth 2 (two) times daily.    Historical Provider, MD   Triage Vitals: BP 104/60 mmHg  Pulse 89   Temp(Src) 98.2 F (36.8 C) (Oral)  Resp 18  Ht 5\' 4"  (1.626 m)  Wt 272 lb (123.378 kg)  BMI 46.67 kg/m2  SpO2 97%  LMP 07/12/2014 (Approximate) Physical Exam  Constitutional: She is oriented to person, place, and time. She appears well-developed.  HENT:  Head: Normocephalic.  Eyes: Conjunctivae and EOM are normal. No scleral icterus.  Neck: Neck supple. No thyromegaly present.  Cardiovascular: Normal rate and regular rhythm.  Exam reveals no gallop and no friction rub.   No murmur heard. Pulmonary/Chest: No stridor. She has no wheezes. She has no rales. She exhibits no tenderness.  Abdominal: Soft. She exhibits no distension. There is tenderness. There is no rebound.  Moderate tenderness to LLQ, RLQ and suprapubic area.  Genitourinary:  Moderate blood in vault,   Moderate tender cervix  Musculoskeletal: Normal range of motion. She exhibits no edema.  Lymphadenopathy:    She has no cervical adenopathy.  Neurological: She is oriented to person, place, and time. She exhibits normal muscle tone. Coordination normal.  Skin: No rash noted. No erythema.  Psychiatric: She has a normal mood and affect. Her behavior is normal.    ED Course  Procedures (including critical care time) DIAGNOSTIC STUDIES: Oxygen Saturation is 97% on RA, normal by my interpretation.   COORDINATION OF CARE: 12:55 PM- Will order pain medication and perform pelvic exam. Pt verbalizes understanding and agrees to plan.  Medications - No data to display  Labs Review Labs Reviewed  CBC WITH DIFFERENTIAL/PLATELET - Abnormal; Notable for the following:    Hemoglobin 10.9 (*)    HCT 35.3 (*)    MCH 25.3 (*)    RDW 15.9 (*)    All other components within normal limits  COMPREHENSIVE METABOLIC PANEL    Imaging Review No results found.   EKG Interpretation None      MDM   Final diagnoses:  None    Pt with vaginal bleeding after colposcopy and cervical biopsy.  Sent here by her gyn for recheck.   Bleeding moderate and pain controled with narcotics.  Pt is to follow up with gyn md this week and rx narcotics for pain.  Mild complication from cervix biopsy  I personally performed the services described in this documentation, which was scribed in my presence. The recorded information has been reviewed and is accurate.    Milton Ferguson, MD 08/08/14 [phone]

## 2014-08-14 ENCOUNTER — Telehealth: Payer: Self-pay | Admitting: Family Medicine

## 2014-08-14 NOTE — Telephone Encounter (Signed)
Ok to refill??  Last office visit/ refill 07/25/2013.  Of note, patient did receive #20 Percocet on 7/5/20126 from [facility] ED.

## 2014-08-14 NOTE — Telephone Encounter (Signed)
ok 

## 2014-08-14 NOTE — Telephone Encounter (Signed)
[phone] PT is needing a refill on HYDROcodone-acetaminophen (NORCO/VICODIN) 5-325 MG per tablet She also is wanting to let Dr Buelah Manis know that the gabapentin (NEURONTIN) 300 MG capsule is working well.

## 2014-08-15 MED ORDER — HYDROCODONE-ACETAMINOPHEN 5-325 MG PO TABS: 1.0000 | ORAL_TABLET | Freq: Three times a day (TID) | ORAL | Status: DC | PRN

## 2014-08-15 NOTE — Telephone Encounter (Signed)
Prescription printed and patient made aware to come to office to pick up on 08/15/2014 after 2pm.  Message to routed to PCP for review.

## 2014-08-18 ENCOUNTER — Telehealth: Payer: Self-pay | Admitting: General Practice

## 2014-08-18 NOTE — Telephone Encounter (Signed)
Called patient regarding colpo results and need for follow up pap in one year. Patient verbalized understanding and had no questions

## 2014-08-22 ENCOUNTER — Emergency Department (HOSPITAL_COMMUNITY)
Admission: EM | Admit: 2014-08-22 | Discharge: 2014-08-23 | Disposition: A | Discharge status: Home / Self Care | Payer: No Typology Code available for payment source | Attending: Emergency Medicine | Admitting: Emergency Medicine

## 2014-08-22 ENCOUNTER — Encounter (HOSPITAL_COMMUNITY): Payer: Self-pay

## 2014-08-22 ENCOUNTER — Telehealth: Payer: Self-pay | Admitting: Family Medicine

## 2014-08-22 DIAGNOSIS — Z79899 Other long term (current) drug therapy: Secondary | ICD-10-CM | POA: Diagnosis not present

## 2014-08-22 DIAGNOSIS — G8929 Other chronic pain: Secondary | ICD-10-CM | POA: Diagnosis not present

## 2014-08-22 DIAGNOSIS — Z862 Personal history of diseases of the blood and blood-forming organs and certain disorders involving the immune mechanism: Secondary | ICD-10-CM | POA: Insufficient documentation

## 2014-08-22 DIAGNOSIS — Z8541 Personal history of malignant neoplasm of cervix uteri: Secondary | ICD-10-CM | POA: Insufficient documentation

## 2014-08-22 DIAGNOSIS — I959 Hypotension, unspecified: Secondary | ICD-10-CM | POA: Insufficient documentation

## 2014-08-22 DIAGNOSIS — Z8679 Personal history of other diseases of the circulatory system: Secondary | ICD-10-CM | POA: Diagnosis not present

## 2014-08-22 DIAGNOSIS — K529 Noninfective gastroenteritis and colitis, unspecified: Secondary | ICD-10-CM | POA: Insufficient documentation

## 2014-08-22 DIAGNOSIS — R112 Nausea with vomiting, unspecified: Secondary | ICD-10-CM | POA: Diagnosis present

## 2014-08-22 DIAGNOSIS — N189 Chronic kidney disease, unspecified: Secondary | ICD-10-CM | POA: Insufficient documentation

## 2014-08-22 LAB — CBC WITH DIFFERENTIAL/PLATELET
Basophils Absolute: 0 10*3/uL (ref 0.0–0.1)
Basophils Relative: 1 % (ref 0–1)
Eosinophils Absolute: 0.2 10*3/uL (ref 0.0–0.7)
Eosinophils Relative: 3 % (ref 0–5)
HCT: 33 % — ABNORMAL LOW (ref 36.0–46.0)
Hemoglobin: 10.2 g/dL — ABNORMAL LOW (ref 12.0–15.0)
Lymphocytes Relative: 35 % (ref 12–46)
Lymphs Abs: 2.4 10*3/uL (ref 0.7–4.0)
MCH: 25.4 pg — ABNORMAL LOW (ref 26.0–34.0)
MCHC: 30.9 g/dL (ref 30.0–36.0)
MCV: 82.3 fL (ref 78.0–100.0)
Monocytes Absolute: 0.8 10*3/uL (ref 0.1–1.0)
Monocytes Relative: 12 % (ref 3–12)
Neutro Abs: 3.5 10*3/uL (ref 1.7–7.7)
Neutrophils Relative %: 49 % (ref 43–77)
Platelets: 266 10*3/uL (ref 150–400)
RBC: 4.01 MIL/uL (ref 3.87–5.11)
RDW: 16.6 % — ABNORMAL HIGH (ref 11.5–15.5)
WBC: 7 10*3/uL (ref 4.0–10.5)

## 2014-08-22 LAB — COMPREHENSIVE METABOLIC PANEL
ALT: 12 U/L — ABNORMAL LOW (ref 14–54)
AST: 15 U/L (ref 15–41)
Albumin: 2.8 g/dL — ABNORMAL LOW (ref 3.5–5.0)
Alkaline Phosphatase: 63 U/L (ref 38–126)
Anion gap: 6 (ref 5–15)
BUN: 11 mg/dL (ref 6–20)
CO2: 26 mmol/L (ref 22–32)
Calcium: 8.3 mg/dL — ABNORMAL LOW (ref 8.9–10.3)
Chloride: 110 mmol/L (ref 101–111)
Creatinine, Ser: 1.19 mg/dL — ABNORMAL HIGH (ref 0.44–1.00)
GFR calc Af Amer: >60 mL/min (ref >60)
GFR calc non Af Amer: 54 mL/min — ABNORMAL LOW (ref >60)
Glucose, Bld: 103 mg/dL — ABNORMAL HIGH (ref 65–99)
Potassium: 4.4 mmol/L (ref 3.5–5.1)
Sodium: 142 mmol/L (ref 135–145)
Total Bilirubin: 0.2 mg/dL — ABNORMAL LOW (ref 0.3–1.2)
Total Protein: 5.8 g/dL — ABNORMAL LOW (ref 6.5–8.1)

## 2014-08-22 LAB — URINALYSIS, ROUTINE W REFLEX MICROSCOPIC
Bilirubin Urine: NEGATIVE
Glucose, UA: NEGATIVE mg/dL
Hgb urine dipstick: NEGATIVE
Ketones, ur: NEGATIVE mg/dL
Nitrite: NEGATIVE
Protein, ur: NEGATIVE mg/dL
Specific Gravity, Urine: 1.026 (ref 1.005–1.030)
Urobilinogen, UA: 0.2 mg/dL (ref 0.0–1.0)
pH: 6 (ref 5.0–8.0)

## 2014-08-22 LAB — CK: Total CK: 111 U/L (ref 38–234)

## 2014-08-22 LAB — URINE MICROSCOPIC-ADD ON

## 2014-08-22 LAB — I-STAT CG4 LACTIC ACID, ED: Lactic Acid, Venous: 0.95 mmol/L (ref 0.5–2.0)

## 2014-08-22 MED ORDER — ONDANSETRON HCL 4 MG/2ML IJ SOLN
4.0000 mg | Freq: Once | INTRAMUSCULAR | Status: AC
  Administered 2014-08-22: 4 mg via INTRAVENOUS
  Filled 2014-08-22: qty 2

## 2014-08-22 MED ORDER — SODIUM CHLORIDE 0.9 % IV BOLUS (SEPSIS)
1000.0000 mL | Freq: Once | INTRAVENOUS | Status: AC
  Administered 2014-08-22: 1000 mL via INTRAVENOUS

## 2014-08-22 MED ORDER — HYDROMORPHONE HCL 1 MG/ML IJ SOLN
1.0000 mg | Freq: Once | INTRAMUSCULAR | Status: AC
  Administered 2014-08-22: 1 mg via INTRAVENOUS
  Filled 2014-08-22: qty 1

## 2014-08-22 NOTE — ED Notes (Addendum)
Pt presents with c/o generalized body aches and vomiting that she has been experiencing since yesterday. Pt takes a good amount of medication (gabapentin, hydrocodone, pantoprazole, methocarbam) and reports that she is not feeling any better. Pt reports she vomited twice last night and three times today.

## 2014-08-22 NOTE — Telephone Encounter (Signed)
If there is indication of DVT send her to ER to be evaluated, if not and both legs are hurting which is chronic, make sure she is taking her gabapentin and her pain meds, schedule visit for tomorrow

## 2014-08-22 NOTE — Telephone Encounter (Signed)
Having severe legs pain again. Says veins are "bulging out of her legs"   Has been home in bed all day.  What is she to do?

## 2014-08-23 ENCOUNTER — Other Ambulatory Visit: Payer: Self-pay | Admitting: Family Medicine

## 2014-08-23 DIAGNOSIS — G629 Polyneuropathy, unspecified: Secondary | ICD-10-CM

## 2014-08-23 MED ORDER — OXYCODONE-ACETAMINOPHEN 5-325 MG PO TABS: 1.0000 | ORAL_TABLET | ORAL | Status: DC | PRN

## 2014-08-23 MED ORDER — HYDROMORPHONE HCL 1 MG/ML IJ SOLN
1.0000 mg | Freq: Once | INTRAMUSCULAR | Status: AC
  Administered 2014-08-23: 1 mg via INTRAVENOUS
  Filled 2014-08-23: qty 1

## 2014-08-23 MED ORDER — PROMETHAZINE HCL 25 MG PO TABS: 25.0000 mg | ORAL_TABLET | Freq: Three times a day (TID) | ORAL | Status: DC | PRN

## 2014-08-23 MED ORDER — GI COCKTAIL ~~LOC~~
30.0000 mL | Freq: Once | ORAL | Status: AC
  Administered 2014-08-23: 30 mL via ORAL
  Filled 2014-08-23: qty 30

## 2014-08-23 NOTE — Discharge Instructions (Signed)
Return here as needed.  Follow-up with your primary care doctor °

## 2014-08-23 NOTE — ED Provider Notes (Signed)
CSN: [id number]     Arrival date & time 08/22/14  1912 History   First MD Initiated Contact with Patient 08/22/14 1954     Chief Complaint  Patient presents with  . Generalized Body Aches  . Emesis     (Consider location/radiation/quality/duration/timing/severity/associated sxs/prior Treatment) HPI Patient presents to the emergency department with the patient presents emergency department with generalized body aches with vomiting since yesterday.  Patient states that she has neuropathy and that is also been bothering her as well.  The patient denies chest pain, shortness of breath, weakness, dizziness, headache, blurred vision, back pain, neck pain, fever, cough, sore throat, runny nose, dysuria, incontinence, diarrhea, hematemesis, bloody stool or syncope.  Patient states nothing seems make her condition, better or worse Past Medical History  Diagnosis Date  . Left knee pain   . Migraine   . Cancer     cervical cx at age 73  . Cervical cancer   . Chronic back pain   . Sciatica   . Anemia   . Chronic kidney disease   . Hypotension   . Vaginal Pap smear, abnormal    Past Surgical History  Procedure Laterality Date  . Tubal ligation    . Cholecystectomy N/A 01/20/2013    Procedure: LAPAROSCOPIC CHOLECYSTECTOMY WITH INTRAOPERATIVE CHOLANGIOGRAM;  Surgeon: Shann Medal, MD;  Location: [facility];  Service: General;  Laterality: N/A;   Family History  Problem Relation Age of Onset  . Cancer Mother   . Colon cancer Mother   . Breast cancer Maternal Grandmother   . Heart disease Maternal Grandmother   . Breast cancer Paternal Grandmother   . Heart disease Paternal Grandmother   . Hypertension Paternal Grandmother   . Alcohol abuse Father   . Drug abuse Father   . Asthma Brother   . Cancer Paternal Grandfather    History  Substance Use Topics  . Smoking status: Never Smoker   . Smokeless tobacco: Never Used  . Alcohol Use: 0.0 oz/week    0 Standard drinks or equivalent per  week     Comment: occasionally   OB History    Gravida Para Term Preterm AB TAB SAB Ectopic Multiple Living   9 7 5 2 2  2   7      Review of Systems All other systems negative except as documented in the HPI. All pertinent positives and negatives as reviewed in the HPI.   Allergies  Benadryl and Onion  Home Medications   Prior to Admission medications   Medication Sig Start Date End Date Taking? Authorizing Provider  gabapentin (NEURONTIN) 300 MG capsule Take 1 capsule (300 mg total) by mouth 3 (three) times daily. 07/26/14  Yes Alycia Rossetti, MD  HYDROcodone-acetaminophen (NORCO/VICODIN) 5-325 MG per tablet Take 1 tablet by mouth 3 (three) times daily as needed for moderate pain. 08/15/14  Yes Susy Frizzle, MD  methocarbamol (ROBAXIN) 500 MG tablet Take 1 tablet (500 mg total) by mouth 2 (two) times daily as needed for muscle spasms. 06/28/14  Yes Alycia Rossetti, MD  pantoprazole (PROTONIX) 40 MG tablet Take 1 tablet (40 mg total) by mouth daily. 08/08/14  Yes Alycia Rossetti, MD  RaNITidine HCl (ZANTAC PO) Take 1 tablet by mouth every evening.   Yes Historical Provider, MD  oxyCODONE-acetaminophen (PERCOCET/ROXICET) 5-325 MG per tablet Take 1 tablet by mouth every 6 (six) hours as needed. Patient not taking: Reported on 08/22/2014 08/08/14   Milton Ferguson, MD   BP 110/58  mmHg  Pulse 79  Temp(Src) 98 F (36.7 C) (Oral)  Resp 18  SpO2 97%  LMP 08/11/2014 (Approximate) Physical Exam  Constitutional: She is oriented to person, place, and time. She appears well-developed and well-nourished. No distress.  HENT:  Head: Normocephalic and atraumatic.  Mouth/Throat: Oropharynx is clear and moist.  Eyes: Pupils are equal, round, and reactive to light.  Neck: Normal range of motion. Neck supple.  Cardiovascular: Normal rate, regular rhythm and normal heart sounds.  Exam reveals no gallop and no friction rub.   No murmur heard. Pulmonary/Chest: Effort normal and breath sounds  normal. No respiratory distress.  Musculoskeletal: She exhibits no edema.  Neurological: She is alert and oriented to person, place, and time. She exhibits normal muscle tone. Coordination normal.  Skin: Skin is warm and dry. No rash noted. No erythema.  Nursing note and vitals reviewed.   ED Course  Procedures (including critical care time) Labs Review Labs Reviewed  COMPREHENSIVE METABOLIC PANEL - Abnormal; Notable for the following:    Glucose, Bld 103 (*)    Creatinine, Ser 1.19 (*)    Calcium 8.3 (*)    Total Protein 5.8 (*)    Albumin 2.8 (*)    ALT 12 (*)    Total Bilirubin 0.2 (*)    GFR calc non Af Amer 54 (*)    All other components within normal limits  CBC WITH DIFFERENTIAL/PLATELET - Abnormal; Notable for the following:    Hemoglobin 10.2 (*)    HCT 33.0 (*)    MCH 25.4 (*)    RDW 16.6 (*)    All other components within normal limits  URINALYSIS, ROUTINE W REFLEX MICROSCOPIC (NOT AT [facility]) - Abnormal; Notable for the following:    APPearance CLOUDY (*)    Leukocytes, UA TRACE (*)    All other components within normal limits  URINE MICROSCOPIC-ADD ON - Abnormal; Notable for the following:    Squamous Epithelial / LPF FEW (*)    All other components within normal limits  CK  I-STAT CG4 LACTIC ACID, ED    The patient will be treated for gastroenteritis and dehydration.  She is advised return here as needed.  Follow-up with her primary care doctor also told to increase her fluid intake     Dalia Heading, PA-C 08/23/14 [id number]  Evelina Bucy, MD 08/23/14 2239

## 2014-08-23 NOTE — Telephone Encounter (Addendum)
Pt went to ED last night.  They gave her phenergan and Oxycodone.  Told her some of her meds may need to be adjusted.  Has appt with provider for Monday.

## 2014-08-23 NOTE — ED Notes (Signed)
Patient has been offered ice chips and water. Pt has been able to tolerate it.

## 2014-08-23 NOTE — Telephone Encounter (Signed)
noted 

## 2014-08-28 ENCOUNTER — Ambulatory Visit: Payer: Self-pay | Admitting: Family Medicine

## 2014-09-06 ENCOUNTER — Ambulatory Visit (INDEPENDENT_AMBULATORY_CARE_PROVIDER_SITE_OTHER): Payer: No Typology Code available for payment source | Admitting: Family Medicine

## 2014-09-06 ENCOUNTER — Encounter: Payer: Self-pay | Admitting: Family Medicine

## 2014-09-06 VITALS — BP 128/78 | HR 78 | Temp 97.8°F | Resp 16 | Ht 66.0 in | Wt 271.0 lb

## 2014-09-06 DIAGNOSIS — G8929 Other chronic pain: Secondary | ICD-10-CM | POA: Diagnosis not present

## 2014-09-06 DIAGNOSIS — R55 Syncope and collapse: Secondary | ICD-10-CM

## 2014-09-06 DIAGNOSIS — M549 Dorsalgia, unspecified: Secondary | ICD-10-CM | POA: Diagnosis not present

## 2014-09-06 DIAGNOSIS — G629 Polyneuropathy, unspecified: Secondary | ICD-10-CM | POA: Diagnosis not present

## 2014-09-06 DIAGNOSIS — M5410 Radiculopathy, site unspecified: Secondary | ICD-10-CM

## 2014-09-06 DIAGNOSIS — K219 Gastro-esophageal reflux disease without esophagitis: Secondary | ICD-10-CM | POA: Diagnosis not present

## 2014-09-06 DIAGNOSIS — E669 Obesity, unspecified: Secondary | ICD-10-CM

## 2014-09-06 MED ORDER — PANTOPRAZOLE SODIUM 40 MG PO TBEC: 40.0000 mg | DELAYED_RELEASE_TABLET | Freq: Two times a day (BID) | ORAL | Status: DC

## 2014-09-06 MED ORDER — ONDANSETRON 4 MG PO TBDP: 4.0000 mg | ORAL_TABLET | Freq: Three times a day (TID) | ORAL | Status: DC | PRN

## 2014-09-06 MED ORDER — HYDROCODONE-ACETAMINOPHEN 5-325 MG PO TABS: 1.0000 | ORAL_TABLET | Freq: Two times a day (BID) | ORAL | Status: DC | PRN

## 2014-09-06 NOTE — Progress Notes (Signed)
Patient ID: Kristi Martin, female   DOB: February 29, 1970, 44 y.o.   MRN: [id number]   Subjective:    Patient ID: Kristi Martin, female    DOB: 30-Jul-1970, 44 y.o.   MRN: [id number]  Patient presents for Vaginal Discharge; Nausea; Chest Pain; and Syncope    patient here with multiple complaints. She has had 2 back-to-back cycles status post her colposcopy and biopsy by GYN for HSIL. She was not sure if this was normal. She has not had any change in abdominal pain with these. She did not call her GYN to discuss this. She also continues to have problems with her acid reflux and feels like everything gets stuck when she eats and that she gets nauseous and has to vomit after a lot of meals. She's been taking the Phenergan I will swells the proton X but they do not seem to help as well. Some day she is also taking Zantac as well.  On the way here she had some chest tightness and was light headed was felt more like burning and if she could belch. She did vomit per report on the way here but now her symptoms have resolved. She also continues to have episodes of severe back pain where her legs give out as well as tingling and numbness. We did an x-ray which was unremarkable that often she is unable to stand or falls because her legs give out. She is set up to have nerve conduction studies done in September with neurology    Review Of Systems:  GEN- denies fatigue, fever, weight loss,weakness, recent illness HEENT- denies eye drainage, change in vision, nasal discharge, CVS- denies chest pain, palpitations RESP- denies SOB, cough, wheeze ABD-+ N/V, change in stools, abd pain GU- denies dysuria, hematuria, dribbling, incontinence MSK- +joint pain, muscle aches, injury Neuro- denies headache, +dizziness, syncope, seizure activity       Objective:    BP 128/78 mmHg  Pulse 78  Temp(Src) 97.8 F (36.6 C) (Oral)  Resp 16  Ht 5\' 6"  (1.676 m)  Wt 271 lb (122.925 kg)  BMI 43.76 kg/m2  LMP 08/31/2014  (Approximate) GEN- NAD, alert and oriented x3,well appearing  HEENT- PERRL, EOMI, non injected sclera, pink conjunctiva, MMM, oropharynx clear Neck- Supple, no bruit  CVS- RRR, no murmur RESP-CTAB ABD-NABS,soft,NT,ND Neuro- CNII-XII intact, normal tone Upper and lower ext, fair ROM spine, TTP lumbar spine and paraspinals, + SLR on right side, DTR symmetric, antalgic gait, pain with all ROM of spine , motor 4+/5 RLE compared to left 5/5 EXT- No edema Pulses- Radial, DP- 2+  EKG-NSR, no ST changes       Assessment & Plan:      Problem List Items Addressed This Visit    Peripheral neuropathy   Obesity   GERD (gastroesophageal reflux disease)    She appears to have severe GERD, increase protonix to 40mg  BID, will send to GI may need EGD , discussed foods to avoid ? The N/V she has not had any weight loss even with these symptoms CT of abd/pelvis in the past have been unremarkable      Relevant Medications   pantoprazole (PROTONIX) 40 MG tablet   ondansetron (ZOFRAN ODT) 4 MG disintegrating tablet   Chronic back pain    Worsening symptoms, with now falls, legs continue to "give out" she has many different complaints and pains that seem out of proportion to her exams. Xrays showed diffuse DDD but not severe, Im going to proceed with MRI  L spine, Nerve conduction will still be done for the severe neuropathy as well Continue gabapentin, norco refilled      Relevant Medications   HYDROcodone-acetaminophen (NORCO/VICODIN) 5-325 MG per tablet    Other Visit Diagnoses    Syncope, unspecified syncope type    -  Primary    No true syncope, more light headed while dry heaving and then had emesis, EKG unremarkable    Relevant Orders    EKG 12-Lead (Completed)       Note: This dictation was prepared with Dragon dictation along with smaller phrase technology. Any transcriptional errors that result from this process are unintentional.

## 2014-09-06 NOTE — Patient Instructions (Addendum)
Take protonix 1 tablet twice  A day zofran under the tongue for nausea Referral for GI  MRI to be scheduled  Continue gabapentin Change appointment to 2nd week Oct-

## 2014-09-07 NOTE — Assessment & Plan Note (Addendum)
She appears to have severe GERD, increase protonix to 40mg  BID, will send to GI may need EGD , discussed foods to avoid ? The N/V she has not had any weight loss even with these symptoms CT of abd/pelvis in the past have been unremarkable Zofran prn nausea as phenergan has not helped

## 2014-09-07 NOTE — Assessment & Plan Note (Signed)
Worsening symptoms, with now falls, legs continue to "give out" she has many different complaints and pains that seem out of proportion to her exams. Xrays showed diffuse DDD but not severe, Im going to proceed with MRI L spine, Nerve conduction will still be done for the severe neuropathy as well Continue gabapentin, norco refilled

## 2014-09-08 ENCOUNTER — Telehealth: Payer: Self-pay | Admitting: *Deleted

## 2014-09-08 NOTE — Telephone Encounter (Signed)
Pt has appointment scheduled at [facility] on Aug 18 at 2:00pm, pt needs to arrive 1:45pm. Lmtrc to pt

## 2014-09-14 ENCOUNTER — Encounter (INDEPENDENT_AMBULATORY_CARE_PROVIDER_SITE_OTHER): Payer: Self-pay | Admitting: *Deleted

## 2014-09-14 NOTE — Telephone Encounter (Signed)
Pt aware of appt.

## 2014-09-21 ENCOUNTER — Telehealth: Payer: Self-pay | Admitting: *Deleted

## 2014-09-21 ENCOUNTER — Ambulatory Visit (HOSPITAL_COMMUNITY)
Admission: RE | Admit: 2014-09-21 | Discharge: 2014-09-21 | Disposition: A | Discharge status: Home / Self Care | Payer: No Typology Code available for payment source | Source: Ambulatory Visit | Attending: Family Medicine | Admitting: Family Medicine

## 2014-09-21 DIAGNOSIS — M545 Low back pain: Secondary | ICD-10-CM

## 2014-09-21 DIAGNOSIS — G629 Polyneuropathy, unspecified: Secondary | ICD-10-CM

## 2014-09-21 DIAGNOSIS — M5410 Radiculopathy, site unspecified: Secondary | ICD-10-CM

## 2014-09-21 NOTE — Telephone Encounter (Signed)
Received call from patient.   States that she attempted to complete MRI at [facility] today. States that she could not tolerate the closed MRI.   Was advised that she requires new order for open MRI D/T claustrophobia. Also requested if anxiety medication could be given before MRI.   MD please advise.

## 2014-09-22 NOTE — Telephone Encounter (Signed)
Okay to send to [facility] for Open MRI Give valium 5mg  1 hour before procedure Please redo the order

## 2014-09-25 ENCOUNTER — Ambulatory Visit: Payer: No Typology Code available for payment source | Admitting: Family Medicine

## 2014-09-25 ENCOUNTER — Telehealth: Payer: Self-pay | Admitting: Family Medicine

## 2014-09-25 MED ORDER — DIAZEPAM 5 MG PO TABS: ORAL_TABLET | ORAL | Status: DC

## 2014-09-25 NOTE — Telephone Encounter (Signed)
New order placed for MRI.   Prescription sent to pharmacy.   Call placed to patient and patient made aware per VM.

## 2014-09-25 NOTE — Telephone Encounter (Signed)
Noted  

## 2014-09-25 NOTE — Telephone Encounter (Signed)
Patient calling about some referrals she is supposed to have  [phone]

## 2014-09-25 NOTE — Telephone Encounter (Signed)
Received call from patient in regards to MRI.   Patient made aware of orders.   Patient also reports that she is leaning heavily to one side while walking and is leaning on things to support her weight. States that this is helping with her back [ain and requested order for cane.   MD please advise.

## 2014-09-25 NOTE — Telephone Encounter (Signed)
I need a diagnosis from the MRI before equipment can be ordered

## 2014-09-28 ENCOUNTER — Telehealth: Payer: Self-pay | Admitting: Family Medicine

## 2014-09-28 NOTE — Telephone Encounter (Signed)
Patient came by the office today states she can't afford to keep seeing Dr. Merlene Laughter at [facility] she states it cost $75.00 per visit. She goes Monday for blood test.  for a Nerve Conduction study on 10/11/14 for the arms she would like to know couldn't they do her legs on the same day so it wouldn't be another $75. She is supposed to see him again on 10/11/14 she doesn't know what to do.   She also dropped of paperwork for food stamp. Please call when the paperwork is ready for pick up. I have placed this paperwork in your inbasket on Dr. Dorian Heckle desk

## 2014-09-28 NOTE — Telephone Encounter (Signed)
To MD

## 2014-09-29 NOTE — Telephone Encounter (Signed)
She needs to discuss this with his office, to see if they can do both on the same day, He is setting up the test, I dont have control over this

## 2014-09-29 NOTE — Telephone Encounter (Signed)
Okay to refill next week

## 2014-09-29 NOTE — Telephone Encounter (Signed)
Call placed to patient and patient made aware.   Patient also requested refill on Hydrocodone, and she would like to pick up on 10/06/2014 (next Friday).   Ok to refill??  Last office visit/ refill 09/06/2014.

## 2014-10-02 ENCOUNTER — Telehealth: Payer: Self-pay | Admitting: Family Medicine

## 2014-10-02 MED ORDER — HYDROCODONE-ACETAMINOPHEN 5-325 MG PO TABS: 1.0000 | ORAL_TABLET | Freq: Two times a day (BID) | ORAL | Status: DC | PRN

## 2014-10-02 NOTE — Telephone Encounter (Signed)
Call placed to patient and patient made aware to pick up prescription on 10/03/2014 per VM.

## 2014-10-02 NOTE — Telephone Encounter (Signed)
Prescription printed.   Call placed to patient. Phone continued to be disconnected. Will attempt at later time.

## 2014-10-02 NOTE — Telephone Encounter (Signed)
Patient calling for refill on her hydrocodone  [phone]

## 2014-10-02 NOTE — Telephone Encounter (Signed)
Please see prior message.   

## 2014-10-05 ENCOUNTER — Telehealth: Payer: Self-pay | Admitting: *Deleted

## 2014-10-05 ENCOUNTER — Emergency Department (HOSPITAL_COMMUNITY)
Admission: EM | Admit: 2014-10-05 | Discharge: 2014-10-05 | Disposition: A | Discharge status: Home / Self Care | Payer: No Typology Code available for payment source | Attending: Emergency Medicine | Admitting: Emergency Medicine

## 2014-10-05 ENCOUNTER — Ambulatory Visit: Payer: No Typology Code available for payment source | Admitting: Physician Assistant

## 2014-10-05 ENCOUNTER — Encounter (HOSPITAL_COMMUNITY): Payer: Self-pay | Admitting: *Deleted

## 2014-10-05 DIAGNOSIS — G43909 Migraine, unspecified, not intractable, without status migrainosus: Secondary | ICD-10-CM | POA: Insufficient documentation

## 2014-10-05 DIAGNOSIS — N12 Tubulo-interstitial nephritis, not specified as acute or chronic: Secondary | ICD-10-CM | POA: Diagnosis not present

## 2014-10-05 DIAGNOSIS — Z8541 Personal history of malignant neoplasm of cervix uteri: Secondary | ICD-10-CM | POA: Diagnosis not present

## 2014-10-05 DIAGNOSIS — Z8679 Personal history of other diseases of the circulatory system: Secondary | ICD-10-CM | POA: Insufficient documentation

## 2014-10-05 DIAGNOSIS — N189 Chronic kidney disease, unspecified: Secondary | ICD-10-CM | POA: Insufficient documentation

## 2014-10-05 DIAGNOSIS — G8929 Other chronic pain: Secondary | ICD-10-CM | POA: Diagnosis not present

## 2014-10-05 DIAGNOSIS — Z79899 Other long term (current) drug therapy: Secondary | ICD-10-CM | POA: Diagnosis not present

## 2014-10-05 DIAGNOSIS — Z862 Personal history of diseases of the blood and blood-forming organs and certain disorders involving the immune mechanism: Secondary | ICD-10-CM | POA: Diagnosis not present

## 2014-10-05 DIAGNOSIS — M545 Low back pain: Secondary | ICD-10-CM | POA: Diagnosis present

## 2014-10-05 LAB — COMPREHENSIVE METABOLIC PANEL
ALBUMIN: 3.1 g/dL — AB (ref 3.5–5.0)
ALT: 9 U/L — ABNORMAL LOW (ref 14–54)
AST: 15 U/L (ref 15–41)
Alkaline Phosphatase: 62 U/L (ref 38–126)
Anion gap: 7 (ref 5–15)
BUN: 13 mg/dL (ref 6–20)
CHLORIDE: 110 mmol/L (ref 101–111)
CO2: 24 mmol/L (ref 22–32)
Calcium: 8.4 mg/dL — ABNORMAL LOW (ref 8.9–10.3)
Creatinine, Ser: 1.23 mg/dL — ABNORMAL HIGH (ref 0.44–1.00)
GFR calc Af Amer: >60 mL/min (ref >60)
GFR, EST NON AFRICAN AMERICAN: 52 mL/min — AB (ref >60)
Glucose, Bld: 106 mg/dL — ABNORMAL HIGH (ref 65–99)
POTASSIUM: 4.2 mmol/L (ref 3.5–5.1)
SODIUM: 141 mmol/L (ref 135–145)
Total Bilirubin: 0.3 mg/dL (ref 0.3–1.2)
Total Protein: 6.5 g/dL (ref 6.5–8.1)

## 2014-10-05 LAB — URINE MICROSCOPIC-ADD ON

## 2014-10-05 LAB — URINALYSIS, ROUTINE W REFLEX MICROSCOPIC
Bilirubin Urine: NEGATIVE
Glucose, UA: NEGATIVE mg/dL
Ketones, ur: NEGATIVE mg/dL
Leukocytes, UA: NEGATIVE
NITRITE: POSITIVE — AB
Protein, ur: NEGATIVE mg/dL
Specific Gravity, Urine: 1.025 (ref 1.005–1.030)
UROBILINOGEN UA: 0.2 mg/dL (ref 0.0–1.0)
pH: 6 (ref 5.0–8.0)

## 2014-10-05 LAB — CBC WITH DIFFERENTIAL/PLATELET
BASOS ABS: 0 10*3/uL (ref 0.0–0.1)
Basophils Relative: 0 % (ref 0–1)
Eosinophils Absolute: 0.1 10*3/uL (ref 0.0–0.7)
Eosinophils Relative: 2 % (ref 0–5)
HEMATOCRIT: 31.5 % — AB (ref 36.0–46.0)
Hemoglobin: 10 g/dL — ABNORMAL LOW (ref 12.0–15.0)
LYMPHS PCT: 30 % (ref 12–46)
Lymphs Abs: 1.6 10*3/uL (ref 0.7–4.0)
MCH: 25.8 pg — ABNORMAL LOW (ref 26.0–34.0)
MCHC: 31.7 g/dL (ref 30.0–36.0)
MCV: 81.2 fL (ref 78.0–100.0)
MONO ABS: 0.6 10*3/uL (ref 0.1–1.0)
Monocytes Relative: 11 % (ref 3–12)
NEUTROS ABS: 3.1 10*3/uL (ref 1.7–7.7)
Neutrophils Relative %: 57 % (ref 43–77)
Platelets: 272 10*3/uL (ref 150–400)
RBC: 3.88 MIL/uL (ref 3.87–5.11)
RDW: 16.5 % — ABNORMAL HIGH (ref 11.5–15.5)
WBC: 5.4 10*3/uL (ref 4.0–10.5)

## 2014-10-05 LAB — LIPASE, BLOOD: LIPASE: 34 U/L (ref 22–51)

## 2014-10-05 MED ORDER — DEXTROSE 5 % IV SOLN
1.0000 g | Freq: Once | INTRAVENOUS | Status: AC
  Administered 2014-10-05: 1 g via INTRAVENOUS
  Filled 2014-10-05: qty 10

## 2014-10-05 MED ORDER — MORPHINE SULFATE (PF) 4 MG/ML IV SOLN
4.0000 mg | Freq: Once | INTRAVENOUS | Status: AC
  Administered 2014-10-05: 4 mg via INTRAVENOUS
  Filled 2014-10-05: qty 1

## 2014-10-05 MED ORDER — PROMETHAZINE HCL 25 MG PO TABS: 25.0000 mg | ORAL_TABLET | Freq: Three times a day (TID) | ORAL | Status: DC | PRN

## 2014-10-05 MED ORDER — SODIUM CHLORIDE 0.9 % IV BOLUS (SEPSIS)
500.0000 mL | Freq: Once | INTRAVENOUS | Status: AC
  Administered 2014-10-05: 500 mL via INTRAVENOUS

## 2014-10-05 MED ORDER — CEPHALEXIN 500 MG PO CAPS: 500.0000 mg | ORAL_CAPSULE | Freq: Two times a day (BID) | ORAL | Status: DC

## 2014-10-05 NOTE — ED Provider Notes (Signed)
CSN: [id number]     Arrival date & time 10/05/14  1024 History  This chart was scribed for Davonna Belling, MD by Irene Pap, ED Scribe. This patient was seen in room APA02/APA02 and patient care was started at 10:49 AM.    Chief Complaint  Patient presents with  . Back Pain   The history is provided by the patient. No language interpreter was used.  HPI Comments: Kristi Martin is a 44 y.o. Female with hx of cervical cancer, chronic kidney disease, sciatica, and hypotension who presents to the Emergency Department complaining of left lower back pain onset one day ago. Pt reports associated frequency, nausea, chills and increased pain with movement. She states that it hurts to breathe. Reports hx of kidney pain on the right side with similar symptoms; states that she was admitted to ICU one year ago for this. She denies fever, cough, sick contacts, or chance of pregnancy. She reports taking hydrocodone, gabapentin and robaxin for her back pain. She reports allergies to Benadryl.   Past Medical History  Diagnosis Date  . Left knee pain   . Migraine   . Cancer     cervical cx at age 36  . Cervical cancer   . Chronic back pain   . Sciatica   . Anemia   . Chronic kidney disease   . Hypotension   . Vaginal Pap smear, abnormal    Past Surgical History  Procedure Laterality Date  . Tubal ligation    . Cholecystectomy N/A 01/20/2013    Procedure: LAPAROSCOPIC CHOLECYSTECTOMY WITH INTRAOPERATIVE CHOLANGIOGRAM;  Surgeon: Shann Medal, MD;  Location: [facility];  Service: General;  Laterality: N/A;   Family History  Problem Relation Age of Onset  . Cancer Mother   . Colon cancer Mother   . Breast cancer Maternal Grandmother   . Heart disease Maternal Grandmother   . Breast cancer Paternal Grandmother   . Heart disease Paternal Grandmother   . Hypertension Paternal Grandmother   . Alcohol abuse Father   . Drug abuse Father   . Asthma Brother   . Cancer Paternal Grandfather     Social History  Substance Use Topics  . Smoking status: Never Smoker   . Smokeless tobacco: Never Used  . Alcohol Use: 0.0 oz/week    0 Standard drinks or equivalent per week     Comment: occasionally   OB History    Gravida Para Term Preterm AB TAB SAB Ectopic Multiple Living   9 7 5 2 2  2   7      Review of Systems  Constitutional: Positive for chills. Negative for fever.  Respiratory: Negative for cough.   Gastrointestinal: Positive for nausea.  Genitourinary: Positive for frequency.  Musculoskeletal: Positive for back pain.  All other systems reviewed and are negative.  Allergies  Benadryl and Onion  Home Medications   Prior to Admission medications   Medication Sig Start Date End Date Taking? Authorizing Provider  gabapentin (NEURONTIN) 300 MG capsule Take 1 capsule (300 mg total) by mouth 3 (three) times daily. 07/26/14  Yes Alycia Rossetti, MD  HYDROcodone-acetaminophen (NORCO/VICODIN) 5-325 MG per tablet Take 1 tablet by mouth 2 (two) times daily as needed for moderate pain. 10/02/14  Yes Alycia Rossetti, MD  methocarbamol (ROBAXIN) 500 MG tablet Take 1 tablet (500 mg total) by mouth 2 (two) times daily as needed for muscle spasms. 06/28/14  Yes Alycia Rossetti, MD  ondansetron (ZOFRAN ODT) 4 MG disintegrating tablet  Take 1 tablet (4 mg total) by mouth every 8 (eight) hours as needed for nausea or vomiting. 09/06/14  Yes Alycia Rossetti, MD  pantoprazole (PROTONIX) 40 MG tablet Take 1 tablet (40 mg total) by mouth 2 (two) times daily. 09/06/14  Yes Alycia Rossetti, MD  cephALEXin (KEFLEX) 500 MG capsule Take 1 capsule (500 mg total) by mouth 2 (two) times daily. 10/05/14   Davonna Belling, MD  diazepam (VALIUM) 5 MG tablet Take (1) tablet by mouth (1) hour prior to procedure. 09/25/14   Alycia Rossetti, MD  promethazine (PHENERGAN) 25 MG tablet Take 1 tablet (25 mg total) by mouth every 8 (eight) hours as needed for nausea or vomiting. 10/05/14   Davonna Belling, MD   BP  112/56 mmHg  Pulse 80  Temp(Src) 97.9 F (36.6 C) (Oral)  Resp 20  Ht 5\' 4"  (1.626 m)  Wt 270 lb (122.471 kg)  BMI 46.32 kg/m2  SpO2 100%  LMP 10/03/2014  Physical Exam  Constitutional: She is oriented to person, place, and time. She appears well-developed and well-nourished.  Appears uncomfortable  HENT:  Head: Normocephalic and atraumatic.  Right Ear: Tympanic membrane normal.  Left Ear: Tympanic membrane normal.  Nose: Nose normal. No mucosal edema or rhinorrhea.  Mouth/Throat: Uvula is midline, oropharynx is clear and moist and mucous membranes are normal.  Eyes: Conjunctivae and EOM are normal. Pupils are equal, round, and reactive to light.  Neck: Normal range of motion. Neck supple.  Cardiovascular: Normal rate, regular rhythm and normal heart sounds.   Pulmonary/Chest: Effort normal and breath sounds normal.  Abdominal: Soft. There is tenderness.  Moderate CVA tenderness on left side, mild LUQ tenderness  Musculoskeletal: Normal range of motion.  No peripheral edema  Neurological: She is alert and oriented to person, place, and time.  Skin: Skin is warm and dry.  Psychiatric: She has a normal mood and affect. Her behavior is normal.  Nursing note and vitals reviewed.   ED Course  Procedures (including critical care time)  Medications  cefTRIAXone (ROCEPHIN) 1 g in dextrose 5 % 50 mL IVPB (1 g Intravenous New Bag/Given 10/05/14 1505)  sodium chloride 0.9 % bolus 500 mL (0 mLs Intravenous Stopped 10/05/14 1308)  morphine 4 MG/ML injection 4 mg (4 mg Intravenous Given 10/05/14 1110)  morphine 4 MG/ML injection 4 mg (4 mg Intravenous Given 10/05/14 1331)    Labs Review Labs Reviewed  URINALYSIS, ROUTINE W REFLEX MICROSCOPIC (NOT AT [facility]) - Abnormal; Notable for the following:    Hgb urine dipstick MODERATE (*)    Nitrite POSITIVE (*)    All other components within normal limits  COMPREHENSIVE METABOLIC PANEL - Abnormal; Notable for the following:    Glucose, Bld 106 (*)     Creatinine, Ser 1.23 (*)    Calcium 8.4 (*)    Albumin 3.1 (*)    ALT 9 (*)    GFR calc non Af Amer 52 (*)    All other components within normal limits  CBC WITH DIFFERENTIAL/PLATELET - Abnormal; Notable for the following:    Hemoglobin 10.0 (*)    HCT 31.5 (*)    MCH 25.8 (*)    RDW 16.5 (*)    All other components within normal limits  URINE MICROSCOPIC-ADD ON - Abnormal; Notable for the following:    Squamous Epithelial / LPF FEW (*)    Bacteria, UA MANY (*)    All other components within normal limits  URINE CULTURE  LIPASE, BLOOD  Imaging Review No results found.    EKG Interpretation None      MDM   Final diagnoses:  Pyelonephritis    Patient with flank pain. Previous pyelonephritis on the other side and some sepsis. Lab work reassuring now. Feels somewhat better after treatment. Will send culture and start on Rocephin and sent home on Keflex. Patient appears to have a prescription to be filled tomorrow from her primary care doctor for pain medicines. I personally performed the services described in this documentation, which was scribed in my presence. The recorded information has been reviewed and is accurate.     Davonna Belling, MD 10/05/14 1535

## 2014-10-05 NOTE — ED Notes (Signed)
MD at bedside. 

## 2014-10-05 NOTE — ED Notes (Signed)
Left lower back pain began yesterday while lying down. Pt states increase urinary frequency and increased pain with movement.

## 2014-10-05 NOTE — Telephone Encounter (Signed)
Pt called stating she has appt today at 200pm with Karis Juba PA and states now she is having some difficulty breathing, SOB, and states hurts so bad when she walks and gets out of the car. I advised pt since since she is hurting this bad and difficulty breathing that she needs to be seen and should go to UC or ED, pt states understanding and will be going to ED now. appt for 200pm has been cancelled by patient since she is going to ED.

## 2014-10-05 NOTE — Discharge Instructions (Signed)
Pyelonephritis, Adult °Pyelonephritis is a kidney infection. In general, there are 2 main types of pyelonephritis: °· Infections that come on quickly without any warning (acute pyelonephritis). °· Infections that persist for a long period of time (chronic pyelonephritis). °CAUSES  °Two main causes of pyelonephritis are: °· Bacteria traveling from the bladder to the kidney. This is a problem especially in pregnant women. The urine in the bladder can become filled with bacteria from multiple causes, including: °¨ Inflammation of the prostate gland (prostatitis). °¨ Sexual intercourse in females. °¨ Bladder infection (cystitis). °· Bacteria traveling from the bloodstream to the tissue part of the kidney. °Problems that may increase your risk of getting a kidney infection include: °· Diabetes. °· Kidney stones or bladder stones. °· Cancer. °· Catheters placed in the bladder. °· Other abnormalities of the kidney or ureter. °SYMPTOMS  °· Abdominal pain. °· Pain in the side or flank area. °· Fever. °· Chills. °· Upset stomach. °· Blood in the urine (dark urine). °· Frequent urination. °· Strong or persistent urge to urinate. °· Burning or stinging when urinating. °DIAGNOSIS  °Your caregiver may diagnose your kidney infection based on your symptoms. A urine sample may also be taken. °TREATMENT  °In general, treatment depends on how severe the infection is.  °· If the infection is mild and caught early, your caregiver may treat you with oral antibiotics and send you home. °· If the infection is more severe, the bacteria may have gotten into the bloodstream. This will require intravenous (IV) antibiotics and a hospital stay. Symptoms may include: °¨ High fever. °¨ Severe flank pain. °¨ Shaking chills. °· Even after a hospital stay, your caregiver may require you to be on oral antibiotics for a period of time. °· Other treatments may be required depending upon the cause of the infection. °HOME CARE INSTRUCTIONS  °· Take your  antibiotics as directed. Finish them even if you start to feel better. °· Make an appointment to have your urine checked to make sure the infection is gone. °· Drink enough fluids to keep your urine clear or pale yellow. °· Take medicines for the bladder if you have urgency and frequency of urination as directed by your caregiver. °SEEK IMMEDIATE MEDICAL CARE IF:  °· You have a fever or persistent symptoms for more than 2-3 days. °· You have a fever and your symptoms suddenly get worse. °· You are unable to take your antibiotics or fluids. °· You develop shaking chills. °· You experience extreme weakness or fainting. °· There is no improvement after 2 days of treatment. °MAKE SURE YOU: °· Understand these instructions. °· Will watch your condition. °· Will get help right away if you are not doing well or get worse. °Document Released: 01/20/2005 Document Revised: 07/22/2011 Document Reviewed: 06/26/2010 °[employer]® Patient Information ©2015 [employer]. This information is not intended to replace advice given to you by your health care provider. Make sure you discuss any questions you have with your health care provider. ° °

## 2014-10-07 ENCOUNTER — Ambulatory Visit
Admission: RE | Admit: 2014-10-07 | Discharge: 2014-10-07 | Disposition: A | Discharge status: Home / Self Care | Payer: No Typology Code available for payment source | Source: Ambulatory Visit | Attending: Family Medicine | Admitting: Family Medicine

## 2014-10-07 DIAGNOSIS — M545 Low back pain: Secondary | ICD-10-CM

## 2014-10-07 DIAGNOSIS — G629 Polyneuropathy, unspecified: Secondary | ICD-10-CM

## 2014-10-07 LAB — URINE CULTURE

## 2014-10-10 ENCOUNTER — Ambulatory Visit (INDEPENDENT_AMBULATORY_CARE_PROVIDER_SITE_OTHER): Payer: No Typology Code available for payment source | Admitting: Family Medicine

## 2014-10-10 ENCOUNTER — Encounter: Payer: Self-pay | Admitting: Family Medicine

## 2014-10-10 VITALS — BP 130/76 | HR 78 | Temp 98.3°F | Resp 16 | Ht 66.0 in | Wt 279.0 lb

## 2014-10-10 DIAGNOSIS — N10 Acute tubulo-interstitial nephritis: Secondary | ICD-10-CM | POA: Diagnosis not present

## 2014-10-10 DIAGNOSIS — M5136 Other intervertebral disc degeneration, lumbar region: Secondary | ICD-10-CM

## 2014-10-10 DIAGNOSIS — R300 Dysuria: Secondary | ICD-10-CM | POA: Diagnosis not present

## 2014-10-10 DIAGNOSIS — E669 Obesity, unspecified: Secondary | ICD-10-CM

## 2014-10-10 DIAGNOSIS — M47816 Spondylosis without myelopathy or radiculopathy, lumbar region: Secondary | ICD-10-CM | POA: Diagnosis not present

## 2014-10-10 LAB — URINALYSIS, ROUTINE W REFLEX MICROSCOPIC
Bilirubin Urine: NEGATIVE
Glucose, UA: NEGATIVE
Hgb urine dipstick: NEGATIVE
Ketones, ur: NEGATIVE
LEUKOCYTES UA: NEGATIVE
NITRITE: NEGATIVE
PH: 5.5 (ref 5.0–8.0)
Protein, ur: NEGATIVE
SPECIFIC GRAVITY, URINE: 1.02 (ref 1.001–1.035)

## 2014-10-10 LAB — CBC W/MCH & 3 PART DIFF
HCT: 34 % — ABNORMAL LOW (ref 36.0–46.0)
Hemoglobin: 10.8 g/dL — ABNORMAL LOW (ref 12.0–15.0)
LYMPHS ABS: 2 10*3/uL (ref 0.7–4.0)
LYMPHS PCT: 37 % (ref 12–46)
MCH: 26 pg (ref 26.0–34.0)
MCHC: 31.8 g/dL (ref 30.0–36.0)
MCV: 81.9 fL (ref 78.0–100.0)
NEUTROS ABS: 2.8 10*3/uL (ref 1.7–7.7)
NEUTROS PCT: 53 % (ref 43–77)
Platelets: 296 10*3/uL (ref 150–400)
RBC: 4.15 MIL/uL (ref 3.87–5.11)
RDW: 17.4 % — AB (ref 11.5–15.5)
WBC mixed population: 0.5 10*3/uL (ref 0.1–1.8)
WBC: 5.3 10*3/uL (ref 4.0–10.5)
WBCMIXPER: 10 % (ref 3–18)

## 2014-10-10 MED ORDER — CIPROFLOXACIN HCL 500 MG PO TABS: 500.0000 mg | ORAL_TABLET | Freq: Two times a day (BID) | ORAL | Status: DC

## 2014-10-10 NOTE — Patient Instructions (Signed)
Stop the keflex Continue phenergan as needed Nerve conduction studies to be done Take 2 gabapentin 600mg  at bedtime Take 2 pain pills as needed for next few days for pain F/U as previous

## 2014-10-10 NOTE — Progress Notes (Signed)
Patient ID: Kristi Martin, female   DOB: 06/18/1970, 44 y.o.   MRN: [id number]   Subjective:    Patient ID: Kristi Martin, female    DOB: 27-May-1970, 44 y.o.   MRN: [id number]  Patient presents for ER F/U  patient to follow-up ER visit. She was seen in the ER on September 1 with concern for acute left pyelonephritis. She states that she began to have severe left-sided flank pain which is different from her typical back pain. She had nausea and vomiting associated she's not had any fever no dysuria. Her urinalysis and culture showed mixed flora she was started on Keflex but feels like she is still not improved. She is taking her pain medicine as prescribed and using Phenergan for nausea as needed. She denies any blood in her urine.  She also had MRI done on Saturday which showed degenerative changes of the lumbar spine as well as facet arthritis but no significant spinal stenosis or nerve root impingement. She was scheduled to have nerve conduction studies done on her hands per report tomorrow instead of her legs. She is here today with her significant other who seems very irritated that she is in so much pain and nothing has been done about it.    Review Of Systems:  GEN- denies fatigue, fever, weight loss,weakness, recent illness HEENT- denies eye drainage, change in vision, nasal discharge, CVS- denies chest pain, palpitations RESP- denies SOB, cough, wheeze ABD- denies N/V, change in stools, abd pain GU- denies dysuria, hematuria, dribbling, incontinence MSK- +joint pain, muscle aches, injury Neuro- denies headache, dizziness, syncope, seizure activity       Objective:    BP 130/76 mmHg  Pulse 78  Temp(Src) 98.3 F (36.8 C) (Oral)  Resp 16  Ht 5\' 6"  (1.676 m)  Wt 279 lb (126.554 kg)  BMI 45.05 kg/m2  LMP 10/03/2014 GEN- NAD, alert and oriented x3, wincing with all movements, cried out when trying to lay back  HEENT- PERRL, EOMI, non injected sclera, pink conjunctiva, MMM,  oropharynx clear CVS- RRR, no murmur RESP-CTAB ABD-NABS,soft,mild suprapubic tenderness, ND, +CVA tenderness left flank EXT- No edema Pulses- Radial, DP- 2+        Assessment & Plan:      Problem List Items Addressed This Visit    Obesity   Facet arthropathy, lumbar   DDD (degenerative disc disease), lumbar    She has chronic back pain with degenerative disc changes and arthritis noted on the x-rays no significant spinal stenosis or nerve contentment with mixed very confusing regarding her paresthesias in her lower extremities tingling numbness in the severe pain that she gets. I do think that she does have chronic back pain especially with her weight in what is seen on the images however her pain seems out of proportion for what is expected for the degenerative changes.  Also with regards to the possible pyelonephritis her white blood cell are normal she has no fever her urine does not show anything significant yet she has extreme pain as well which seems out of proportion.   I recommend that she increase her bedtime dose of gabapentin to 600 mg where had to reschedule the nerve conduction study on her legs because of her current infection. She was here today with her boyfriend who seems very irritated that her pain was not improved in spite of this being a chronic problem that she never truly had addressed. I offered epidural injections which we can also have done locally she  was more concerned about the co-pay.       Other Visit Diagnoses    Dysuria    -  Primary    Relevant Orders    Urinalysis, Routine w reflex microscopic (not at [facility]) (Completed)    Urine culture    Acute pyelonephritis        possible pylenephritis, no improevment with keflex, though UA is fairly benign here today, culture resent, change to cipro, advised to take 2 of norco, phenerga    Relevant Orders    CBC w/MCH & 3 Part Diff (Completed)    Basic metabolic panel       Note: This dictation was  prepared with Dragon dictation along with smaller phrase technology. Any transcriptional errors that result from this process are unintentional.

## 2014-10-10 NOTE — Assessment & Plan Note (Addendum)
She has chronic back pain with degenerative disc changes and arthritis noted on the x-rays no significant spinal stenosis or nerve contentment with mixed very confusing regarding her paresthesias in her lower extremities tingling numbness in the severe pain that she gets. I do think that she does have chronic back pain especially with her weight in what is seen on the images however her pain seems out of proportion for what is expected for the degenerative changes.  Also with regards to the possible pyelonephritis her white blood cell are normal she has no fever her urine does not show anything significant yet she has extreme pain as well which seems out of proportion.   I recommend that she increase her bedtime dose of gabapentin to 600 mg where had to reschedule the nerve conduction study on her legs because of her current infection. She was here today with her boyfriend who seems very irritated that her pain was not improved in spite of this being a chronic problem that she never truly had addressed. I offered epidural injections which we can also have done locally she was more concerned about the co-pay.

## 2014-10-11 ENCOUNTER — Ambulatory Visit (INDEPENDENT_AMBULATORY_CARE_PROVIDER_SITE_OTHER): Payer: No Typology Code available for payment source | Admitting: Internal Medicine

## 2014-10-11 LAB — BASIC METABOLIC PANEL
BUN: 15 mg/dL (ref 7–25)
CALCIUM: 8.7 mg/dL (ref 8.6–10.2)
CHLORIDE: 106 mmol/L (ref 98–110)
CO2: 22 mmol/L (ref 20–31)
Creat: 1.03 mg/dL (ref 0.50–1.10)
GLUCOSE: 87 mg/dL (ref 70–99)
POTASSIUM: 4.6 mmol/L (ref 3.5–5.3)
SODIUM: 140 mmol/L (ref 135–146)

## 2014-10-12 LAB — URINE CULTURE
Colony Count: NO GROWTH
Organism ID, Bacteria: NO GROWTH

## 2014-10-18 ENCOUNTER — Ambulatory Visit (INDEPENDENT_AMBULATORY_CARE_PROVIDER_SITE_OTHER): Payer: No Typology Code available for payment source | Admitting: Family Medicine

## 2014-10-18 ENCOUNTER — Encounter: Payer: Self-pay | Admitting: Family Medicine

## 2014-10-18 ENCOUNTER — Telehealth: Payer: Self-pay | Admitting: *Deleted

## 2014-10-18 VITALS — BP 132/76 | HR 82 | Temp 97.7°F | Resp 14 | Ht 66.0 in | Wt 281.0 lb

## 2014-10-18 DIAGNOSIS — M5136 Other intervertebral disc degeneration, lumbar region: Secondary | ICD-10-CM | POA: Diagnosis not present

## 2014-10-18 DIAGNOSIS — Z23 Encounter for immunization: Secondary | ICD-10-CM | POA: Diagnosis not present

## 2014-10-18 DIAGNOSIS — M47816 Spondylosis without myelopathy or radiculopathy, lumbar region: Secondary | ICD-10-CM | POA: Diagnosis not present

## 2014-10-18 DIAGNOSIS — G629 Polyneuropathy, unspecified: Secondary | ICD-10-CM

## 2014-10-18 DIAGNOSIS — M549 Dorsalgia, unspecified: Secondary | ICD-10-CM

## 2014-10-18 DIAGNOSIS — G8929 Other chronic pain: Secondary | ICD-10-CM | POA: Diagnosis not present

## 2014-10-18 MED ORDER — METHOCARBAMOL 500 MG PO TABS: 500.0000 mg | ORAL_TABLET | Freq: Two times a day (BID) | ORAL | Status: DC | PRN

## 2014-10-18 MED ORDER — HYDROCODONE-ACETAMINOPHEN 5-325 MG PO TABS: 1.0000 | ORAL_TABLET | Freq: Two times a day (BID) | ORAL | Status: DC | PRN

## 2014-10-18 MED ORDER — GABAPENTIN 300 MG PO CAPS: ORAL_CAPSULE | ORAL | Status: DC

## 2014-10-18 NOTE — Telephone Encounter (Signed)
Prescription sent to pharmacy.

## 2014-10-18 NOTE — Progress Notes (Signed)
Patient ID: Kristi Martin, female   DOB: [birth year], 44 y.o.   MRN: [id number]   Subjective:    Patient ID: Kristi Martin, female    DOB: [birth year], 44 y.o.   MRN: [id number]  Patient presents for Medication Review  Patient here to follow-up last visit. She was treated for possible pyelonephritis the urine culture labs all came back normal. She states that she feels very good now she was able to get out without the help of her fianc. She has been more active and she has been in years. Her pain is now controlled with taking 2 tablets twice a day she's also taking her gabapentin and her muscle relaxers. She states that she actually got up and couldn't get her and went to church which she has not done in months because of her pain and feeling sick. Her nerve conduction study for her lower extremities is being rescheduled  Review Of Systems:  GEN- denies fatigue, fever, weight loss,weakness, recent illness HEENT- denies eye drainage, change in vision, nasal discharge, CVS- denies chest pain, palpitations RESP- denies SOB, cough, wheeze ABD- denies N/V, change in stools, abd pain GU- denies dysuria, hematuria, dribbling, incontinence MSK-+ joint pain, muscle aches, injury Neuro- denies headache, dizziness, syncope, seizure activity       Objective:    BP 132/76 mmHg  Pulse 82  Temp(Src) 97.7 F (36.5 C) (Oral)  Resp 14  Ht 5\' 6"  (1.676 m)  Wt 281 lb (127.461 kg)  BMI 45.38 kg/m2  LMP 10/03/2014 GEN- NAD, alert and oriented x3, happy, non antalgic gait  ABD-NABS,soft,NT,ND EXT- trace pedal  edema Pulses- Radial, DP- 2+        Assessment & Plan:      Problem List Items Addressed This Visit    Peripheral neuropathy - Primary   Relevant Medications   gabapentin (NEURONTIN) 300 MG capsule   Facet arthropathy, lumbar   DDD (degenerative disc disease), lumbar   Relevant Medications   HYDROcodone-acetaminophen (NORCO/VICODIN) 5-325 MG per tablet   Chronic back pain   Relevant Medications   HYDROcodone-acetaminophen (NORCO/VICODIN) 5-325 MG per tablet      Note: This dictation was prepared with Dragon dictation along with smaller phrase technology. Any transcriptional errors that result from this process are unintentional.

## 2014-10-18 NOTE — Addendum Note (Signed)
Addended by: Sheral Flow on: 10/18/2014 11:31 AM   Modules accepted: Orders, Medications

## 2014-10-18 NOTE — Telephone Encounter (Signed)
Okay to refill? 

## 2014-10-18 NOTE — Patient Instructions (Signed)
Gabapentin- 1 capsule twice a day, 2 at bedtime Pain 1-2 twice a day  New prescription Flu shot given Nerve conduction to be rescheduled Change F/U To [facility] November

## 2014-10-18 NOTE — Assessment & Plan Note (Addendum)
Her pain is significantly improved over the past week which honestly Im quite surprised by.  I will still  have her do the nerve conduction studies as I stated in my previous notes that her pain is out of proportion to what shows up on her scans. For now I will continue her with the pain medication 1-2 tablets twice a day in her gabapentin will be changed to 300 mg twice a day and 600 mg at bedtime. I think in general she was still benefit from an epidural injection so we are keeping the appointment with the neurologist.

## 2014-10-18 NOTE — Telephone Encounter (Signed)
Received fax requesting refill on Robaxin.   Ok to refill? 

## 2014-10-18 NOTE — Telephone Encounter (Signed)
Call placed to [facility] Neuro in regards to BLE Nerve Conduction Study.   Study scheduled for 11/01/2014 @ 10am.   Call placed to patient and patient made aware per VM.

## 2014-10-20 ENCOUNTER — Encounter (HOSPITAL_COMMUNITY): Payer: Self-pay

## 2014-10-20 ENCOUNTER — Emergency Department (HOSPITAL_COMMUNITY): Payer: No Typology Code available for payment source

## 2014-10-20 ENCOUNTER — Emergency Department (HOSPITAL_COMMUNITY)
Admission: EM | Admit: 2014-10-20 | Discharge: 2014-10-20 | Disposition: A | Discharge status: Home / Self Care | Payer: No Typology Code available for payment source | Attending: Emergency Medicine | Admitting: Emergency Medicine

## 2014-10-20 DIAGNOSIS — Z8739 Personal history of other diseases of the musculoskeletal system and connective tissue: Secondary | ICD-10-CM | POA: Insufficient documentation

## 2014-10-20 DIAGNOSIS — G43909 Migraine, unspecified, not intractable, without status migrainosus: Secondary | ICD-10-CM | POA: Insufficient documentation

## 2014-10-20 DIAGNOSIS — Z8679 Personal history of other diseases of the circulatory system: Secondary | ICD-10-CM | POA: Insufficient documentation

## 2014-10-20 DIAGNOSIS — Z8541 Personal history of malignant neoplasm of cervix uteri: Secondary | ICD-10-CM | POA: Insufficient documentation

## 2014-10-20 DIAGNOSIS — Y9389 Activity, other specified: Secondary | ICD-10-CM | POA: Insufficient documentation

## 2014-10-20 DIAGNOSIS — Y998 Other external cause status: Secondary | ICD-10-CM | POA: Insufficient documentation

## 2014-10-20 DIAGNOSIS — Y9289 Other specified places as the place of occurrence of the external cause: Secondary | ICD-10-CM | POA: Insufficient documentation

## 2014-10-20 DIAGNOSIS — G8929 Other chronic pain: Secondary | ICD-10-CM | POA: Insufficient documentation

## 2014-10-20 DIAGNOSIS — S63502A Unspecified sprain of left wrist, initial encounter: Secondary | ICD-10-CM | POA: Insufficient documentation

## 2014-10-20 DIAGNOSIS — W108XXA Fall (on) (from) other stairs and steps, initial encounter: Secondary | ICD-10-CM | POA: Diagnosis not present

## 2014-10-20 DIAGNOSIS — Z79899 Other long term (current) drug therapy: Secondary | ICD-10-CM | POA: Insufficient documentation

## 2014-10-20 DIAGNOSIS — Z862 Personal history of diseases of the blood and blood-forming organs and certain disorders involving the immune mechanism: Secondary | ICD-10-CM | POA: Diagnosis not present

## 2014-10-20 DIAGNOSIS — N189 Chronic kidney disease, unspecified: Secondary | ICD-10-CM | POA: Diagnosis not present

## 2014-10-20 DIAGNOSIS — S6992XA Unspecified injury of left wrist, hand and finger(s), initial encounter: Secondary | ICD-10-CM | POA: Diagnosis present

## 2014-10-20 MED ORDER — HYDROCODONE-ACETAMINOPHEN 5-325 MG PO TABS
1.0000 | ORAL_TABLET | Freq: Once | ORAL | Status: AC
  Administered 2014-10-20: 1 via ORAL
  Filled 2014-10-20: qty 1

## 2014-10-20 MED ORDER — HYDROCODONE-ACETAMINOPHEN 5-325 MG PO TABS: 1.0000 | ORAL_TABLET | ORAL | Status: DC | PRN

## 2014-10-20 NOTE — Discharge Instructions (Signed)
Wrist Splint A wrist splint holds your wrist in a set position so that it does not move (fixed position). It can help broken bones and sprains heal faster, with less pain. It can also help relieve pressure on the nerve that runs down the middle of your arm (median nerve) into your fingers.  HOME CARE  Wear your splint as told by your doctor. It may be worn while you sleep.  Exercise your wrist as told by your doctor. These exercises help keep muscle strength in your hand and wrist. They also help to make sure you keep motion in your fingers. GET HELP RIGHT AWAY IF:   You start to lose feeling in your hand or fingers.  Your skin or fingernails turn blue or gray, or they feel cold. MAKE SURE YOU:   Understand these instructions.  Will watch your condition.  Will get help right away if you are not doing well or get worse. Document Released: 07/09/2007 Document Revised: 04/14/2011 Document Reviewed: 05/03/2013 [facility] Patient Information 2015 [place], [place]. This information is not intended to replace advice given to you by your health care provider. Make sure you discuss any questions you have with your health care provider.  Wrist Sprain  A sprain is an injury in which a ligament that maintains the proper alignment of a joint is partially or completely torn. The ligaments of the wrist are susceptible to sprains. Sprains are classified into three categories. Grade 1 sprains cause pain, but the tendon is not lengthened. Grade 2 sprains include a lengthened ligament because the ligament is stretched or partially ruptured. With grade 2 sprains there is still function, although the function may be diminished. Grade 3 sprains are characterized by a complete tear of the tendon or muscle, and function is usually impaired. SYMPTOMS   Pain tenderness, inflammation, and/or bruising (contusion) of the injury.  A "pop" or tear felt and/or heard at the time of injury.  Decreased wrist  function. CAUSES  A wrist sprain occurs when a force is placed on one or more ligaments that is greater than it/they can withstand. Common mechanisms of injury include:  Catching a ball with you hands.  Repetitive and/ or strenuous extension or flexion of the wrist. RISK INCREASES WITH:  Previous wrist injury.  Contact sports (boxing or wrestling).  Activities in which falling is common.  Poor strength and flexibility.  Improperly fitted or padded protective equipment. PREVENTION  Warm up and stretch properly before activity.  Allow for adequate recovery between workouts.  Maintain physical fitness:  Strength, flexibility, and endurance.  Cardiovascular fitness.  Protect the wrist joint by limiting its motion with the use of taping, braces, or splints.  Protect the wrist after injury for 6 to 12 months. PROGNOSIS  The prognosis for wrist sprains depends on the degree of injury. Grade 1 sprains require 2 to 6 weeks of treatment. Grade 2 sprains require 6 to 8 weeks of treatment, and grade 3 sprains require up to 12 weeks.  RELATED COMPLICATIONS   Prolonged healing time, if improperly treated or re-injured.  Recurrent symptoms that result in a chronic problem.  Injury to nearby structures (bone, cartilage, nerves, or tendons).  Arthritis of the wrist.  Inability to compete in athletics at a high level.  Wrist stiffness or weakness.  Progression to a complete rupture of the ligament. TREATMENT  Treatment initially involves resting from any activities that aggravate the symptoms, and the use of ice and medications to help reduce pain and inflammation.  Your caregiver may recommend immobilizing the wrist for a period of time in order to reduce stress on the ligament and allow for healing. After immobilization it is important to perform strengthening and stretching exercises to help regain strength and a full range of motion. These exercises may be completed at home or  with a therapist. Surgery is not usually required for wrist sprains, unless the ligament has been ruptured (grade 3 sprain). MEDICATION   If pain medication is necessary, then nonsteroidal anti-inflammatory medications, such as aspirin and ibuprofen, or other minor pain relievers, such as acetaminophen, are often recommended.  Do not take pain medication for 7 days before surgery.  Prescription pain relievers may be given if deemed necessary by your caregiver. Use only as directed and only as much as you need. HEAT AND COLD  Cold treatment (icing) relieves pain and reduces inflammation. Cold treatment should be applied for 10 to 15 minutes every 2 to 3 hours for inflammation and pain and immediately after any activity that aggravates your symptoms. Use ice packs or massage the area with a piece of ice (ice massage).  Heat treatment may be used prior to performing the stretching and strengthening activities prescribed by your caregiver, physical therapist, or athletic trainer. Use a heat pack or soak your injury in warm water. SEEK MEDICAL CARE IF:  Treatment seems to offer no benefit, or the condition worsens.  Any medications produce adverse side effects.

## 2014-10-20 NOTE — ED Notes (Signed)
Pt reports fell going up some stairs last night and hurt left wrist.  Swelling noted.  Radial pulse present.   Pt can move fingers but not without pain.

## 2014-10-23 NOTE — ED Provider Notes (Signed)
CSN: [id number]     Arrival date & time 10/20/14  72 History   First MD Initiated Contact with Patient 10/20/14 1109     Chief Complaint  Patient presents with  . Wrist Pain     (Consider location/radiation/quality/duration/timing/severity/associated sxs/prior Treatment) The history is provided by the patient.   Kristi Martin is a 44 y.o. female presenting with left wrist pain and swelling.  She tripped going up stairs last night, landing on her outstretched left hand with pain and swelling since which is worsened with movement and palpation.  She denies numbness in her hand or fingers since the event and there is no radiation of pain, denying forearm, elbow or shoulder pain. She has used ice and elevation with no significant improvement in pain.    Past Medical History  Diagnosis Date  . Left knee pain   . Migraine   . Cancer     cervical cx at age 74  . Cervical cancer   . Chronic back pain   . Sciatica   . Anemia   . Chronic kidney disease   . Hypotension   . Vaginal Pap smear, abnormal   . DDD (degenerative disc disease), lumbar     Facet arthritis   Past Surgical History  Procedure Laterality Date  . Tubal ligation    . Cholecystectomy N/A 01/20/2013    Procedure: LAPAROSCOPIC CHOLECYSTECTOMY WITH INTRAOPERATIVE CHOLANGIOGRAM;  Surgeon: Shann Medal, MD;  Location: [facility];  Service: General;  Laterality: N/A;   Family History  Problem Relation Age of Onset  . Cancer Mother   . Colon cancer Mother   . Breast cancer Maternal Grandmother   . Heart disease Maternal Grandmother   . Breast cancer Paternal Grandmother   . Heart disease Paternal Grandmother   . Hypertension Paternal Grandmother   . Alcohol abuse Father   . Drug abuse Father   . Asthma Brother   . Cancer Paternal Grandfather    Social History  Substance Use Topics  . Smoking status: Never Smoker   . Smokeless tobacco: Never Used  . Alcohol Use: 0.0 oz/week    0 Standard drinks or equivalent  per week     Comment: occasionally   OB History    Gravida Para Term Preterm AB TAB SAB Ectopic Multiple Living   9 7 5 2 2  2   7      Review of Systems  Constitutional: Negative for fever.  Musculoskeletal: Positive for joint swelling and arthralgias. Negative for myalgias.  Neurological: Negative for weakness and numbness.      Allergies  Benadryl and Onion  Home Medications   Prior to Admission medications   Medication Sig Start Date End Date Taking? Authorizing Achille Xiang  gabapentin (NEURONTIN) 300 MG capsule Take 1 capsule twice a day and 2 at bedtime Patient taking differently: Take 300-600 mg by mouth 3 (three) times daily. Take 1 capsule in the morning, 1 capsule in the afternoon, and 2 capsules at bedtime. 10/18/14  Yes Alycia Rossetti, MD  methocarbamol (ROBAXIN) 500 MG tablet Take 1 tablet (500 mg total) by mouth 2 (two) times daily as needed for muscle spasms. 10/18/14  Yes Alycia Rossetti, MD  ondansetron (ZOFRAN ODT) 4 MG disintegrating tablet Take 1 tablet (4 mg total) by mouth every 8 (eight) hours as needed for nausea or vomiting. 09/06/14  Yes Alycia Rossetti, MD  pantoprazole (PROTONIX) 40 MG tablet Take 1 tablet (40 mg total) by mouth 2 (two) times  daily. Patient taking differently: Take 40 mg by mouth 3 (three) times daily.  09/06/14  Yes Alycia Rossetti, MD  promethazine (PHENERGAN) 25 MG tablet Take 1 tablet (25 mg total) by mouth every 8 (eight) hours as needed for nausea or vomiting. 10/05/14  Yes Davonna Belling, MD  ciprofloxacin (CIPRO) 500 MG tablet Take 1 tablet by mouth 3 (three) times daily. 10/10/14   Historical Darrell Hauk, MD  HYDROcodone-acetaminophen (NORCO/VICODIN) 5-325 MG per tablet Take 1 tablet by mouth every 4 (four) hours as needed. 10/20/14   Evalee Jefferson, PA-C   BP 142/97 mmHg  Pulse 72  Temp(Src) 97.5 F (36.4 C) (Oral)  Resp 16  Ht 5\' 4"  (1.626 m)  Wt 281 lb (127.461 kg)  BMI 48.21 kg/m2  SpO2 100%  LMP 10/03/2014 Physical Exam    Constitutional: She appears well-developed and well-nourished.  HENT:  Head: Atraumatic.  Neck: Normal range of motion.  Cardiovascular:  Pulses equal bilaterally  Musculoskeletal: She exhibits tenderness.       Hands: ttp along left mcp joint including scapular ttp.  Distal sensation intact, less than 2 sec cap refill in fingertips.  Mild edema along dorsal wrist. No proximal forearm or elbow ttp.  Neurological: She is alert. She has normal strength. She displays normal reflexes. No sensory deficit.  Skin: Skin is warm and dry.  Psychiatric: She has a normal mood and affect.    ED Course  Procedures (including critical care time) Labs Review Labs Reviewed - No data to display  Imaging Review No results found.   Final result by Rad Results In Interface (10/20/14 11:24:05)   Narrative:   CLINICAL DATA: [place] going up the steps last night. Landed on left hand and wrist. Posterior wrist pain and swelling.  EXAM: LEFT WRIST - COMPLETE 3+ VIEW  COMPARISON: None.  FINDINGS: There is no evidence of fracture or dislocation. There is no evidence of arthropathy or other focal bone abnormality. Soft tissues are unremarkable.  IMPRESSION: Negative.   Electronically Signed By: Nolon Nations M.D. On: 10/20/2014 11:24     I have personally reviewed and evaluated these images and lab results as part of my medical decision-making.   EKG Interpretation None      MDM   Final diagnoses:  Wrist sprain, left, initial encounter    Suspect sprain, but cannot rule out occult scaphoid injury which was discussed with pt.  She was placed in a thumb spica splint, advised ice tx, RICE,  Hydrocodone.  F/u with Dr. Aline Brochure if sx persist or are not starting to improve over the next week.    Radiological studies were viewed, interpreted and considered during the medical decision making and disposition process. I agree with radiologists reading.  Results were also discussed  with patient.      Evalee Jefferson, PA-C 10/23/14 [place], MD 10/25/14 [phone]

## 2014-11-09 ENCOUNTER — Telehealth: Payer: Self-pay | Admitting: Family Medicine

## 2014-11-09 NOTE — Telephone Encounter (Signed)
Ok to refill??  Last office visit 10/18/2014.  Last refill 10/18/2014, #100 tabs.  Of note, was seen in ED on 10/20/2014 and was given #20 tabs.

## 2014-11-09 NOTE — Telephone Encounter (Signed)
Patient requesting rx for her hydrocodone  [phone]

## 2014-11-10 NOTE — Telephone Encounter (Signed)
Call placed to patient and patient made aware per VM.  

## 2014-11-10 NOTE — Telephone Encounter (Signed)
She can not get until the Oct 17th

## 2014-11-13 ENCOUNTER — Ambulatory Visit: Payer: No Typology Code available for payment source | Admitting: Family Medicine

## 2014-11-17 ENCOUNTER — Other Ambulatory Visit: Payer: Self-pay | Admitting: Family Medicine

## 2014-11-17 MED ORDER — HYDROCODONE-ACETAMINOPHEN 5-325 MG PO TABS: 1.0000 | ORAL_TABLET | Freq: Two times a day (BID) | ORAL | Status: DC | PRN

## 2014-11-17 NOTE — Telephone Encounter (Signed)
LRF 10/20/14 #20  LOV 10/18/14  OK refill?

## 2014-11-17 NOTE — Telephone Encounter (Signed)
Pt aware Rx ready 

## 2014-11-17 NOTE — Telephone Encounter (Signed)
Please refill my script from Sept which is the 1-2 tabs BID #100

## 2014-11-21 ENCOUNTER — Telehealth: Payer: Self-pay | Admitting: Family Medicine

## 2014-11-21 NOTE — Telephone Encounter (Signed)
Call placed to patient. LMTRC.  

## 2014-11-21 NOTE — Telephone Encounter (Signed)
Patient is calling to tell you that her ankles are swollen and she will call back tomorrow and let you know if she is any better  [phone] (H)

## 2014-11-22 ENCOUNTER — Ambulatory Visit: Payer: Self-pay | Admitting: Family Medicine

## 2014-11-22 NOTE — Telephone Encounter (Signed)
Patient returned call.   States that B feet are swollen and she cannot put on her shoes.   Denies increased sodium intake, SOB, chest pains.   Appointment scheduled.

## 2014-12-06 ENCOUNTER — Encounter: Payer: Self-pay | Admitting: Family Medicine

## 2014-12-06 ENCOUNTER — Ambulatory Visit (INDEPENDENT_AMBULATORY_CARE_PROVIDER_SITE_OTHER): Payer: No Typology Code available for payment source | Admitting: Family Medicine

## 2014-12-06 ENCOUNTER — Emergency Department (HOSPITAL_COMMUNITY): Payer: No Typology Code available for payment source

## 2014-12-06 ENCOUNTER — Telehealth: Payer: Self-pay | Admitting: *Deleted

## 2014-12-06 ENCOUNTER — Emergency Department (HOSPITAL_COMMUNITY)
Admission: EM | Admit: 2014-12-06 | Discharge: 2014-12-06 | Disposition: A | Discharge status: Home / Self Care | Payer: No Typology Code available for payment source | Attending: Emergency Medicine | Admitting: Emergency Medicine

## 2014-12-06 ENCOUNTER — Encounter (HOSPITAL_COMMUNITY): Payer: Self-pay

## 2014-12-06 VITALS — BP 132/72 | HR 78 | Temp 97.9°F | Resp 18 | Ht 66.0 in | Wt 279.0 lb

## 2014-12-06 DIAGNOSIS — Z79899 Other long term (current) drug therapy: Secondary | ICD-10-CM | POA: Insufficient documentation

## 2014-12-06 DIAGNOSIS — N189 Chronic kidney disease, unspecified: Secondary | ICD-10-CM | POA: Insufficient documentation

## 2014-12-06 DIAGNOSIS — Z8739 Personal history of other diseases of the musculoskeletal system and connective tissue: Secondary | ICD-10-CM | POA: Diagnosis not present

## 2014-12-06 DIAGNOSIS — R51 Headache: Secondary | ICD-10-CM

## 2014-12-06 DIAGNOSIS — Z8679 Personal history of other diseases of the circulatory system: Secondary | ICD-10-CM | POA: Diagnosis not present

## 2014-12-06 DIAGNOSIS — G629 Polyneuropathy, unspecified: Secondary | ICD-10-CM | POA: Diagnosis not present

## 2014-12-06 DIAGNOSIS — Z862 Personal history of diseases of the blood and blood-forming organs and certain disorders involving the immune mechanism: Secondary | ICD-10-CM | POA: Diagnosis not present

## 2014-12-06 DIAGNOSIS — Z8541 Personal history of malignant neoplasm of cervix uteri: Secondary | ICD-10-CM | POA: Insufficient documentation

## 2014-12-06 DIAGNOSIS — G43909 Migraine, unspecified, not intractable, without status migrainosus: Secondary | ICD-10-CM | POA: Diagnosis not present

## 2014-12-06 DIAGNOSIS — R2990 Unspecified symptoms and signs involving the nervous system: Secondary | ICD-10-CM

## 2014-12-06 DIAGNOSIS — G8929 Other chronic pain: Secondary | ICD-10-CM | POA: Insufficient documentation

## 2014-12-06 DIAGNOSIS — R519 Headache, unspecified: Secondary | ICD-10-CM

## 2014-12-06 DIAGNOSIS — R203 Hyperesthesia: Secondary | ICD-10-CM | POA: Diagnosis not present

## 2014-12-06 HISTORY — DX: Polyneuropathy, unspecified: G62.9

## 2014-12-06 LAB — CBC
HEMATOCRIT: 31.2 % — AB (ref 36.0–46.0)
Hemoglobin: 9.7 g/dL — ABNORMAL LOW (ref 12.0–15.0)
MCH: 24.7 pg — AB (ref 26.0–34.0)
MCHC: 31.1 g/dL (ref 30.0–36.0)
MCV: 79.6 fL (ref 78.0–100.0)
PLATELETS: 277 10*3/uL (ref 150–400)
RBC: 3.92 MIL/uL (ref 3.87–5.11)
RDW: 16.2 % — ABNORMAL HIGH (ref 11.5–15.5)
WBC: 4.9 10*3/uL (ref 4.0–10.5)

## 2014-12-06 LAB — BASIC METABOLIC PANEL
Anion gap: 5 (ref 5–15)
BUN: 10 mg/dL (ref 6–20)
CHLORIDE: 110 mmol/L (ref 101–111)
CO2: 26 mmol/L (ref 22–32)
CREATININE: 1.22 mg/dL — AB (ref 0.44–1.00)
Calcium: 8.2 mg/dL — ABNORMAL LOW (ref 8.9–10.3)
GFR calc non Af Amer: 52 mL/min — ABNORMAL LOW (ref >60)
Glucose, Bld: 105 mg/dL — ABNORMAL HIGH (ref 65–99)
POTASSIUM: 3.3 mmol/L — AB (ref 3.5–5.1)
Sodium: 141 mmol/L (ref 135–145)

## 2014-12-06 MED ORDER — DEXAMETHASONE SODIUM PHOSPHATE 10 MG/ML IJ SOLN
10.0000 mg | Freq: Once | INTRAMUSCULAR | Status: AC
  Administered 2014-12-06: 10 mg via INTRAVENOUS
  Filled 2014-12-06: qty 1

## 2014-12-06 MED ORDER — PROCHLORPERAZINE EDISYLATE 5 MG/ML IJ SOLN
10.0000 mg | Freq: Once | INTRAMUSCULAR | Status: AC
  Administered 2014-12-06: 10 mg via INTRAVENOUS
  Filled 2014-12-06: qty 2

## 2014-12-06 MED ORDER — SODIUM CHLORIDE 0.9 % IV BOLUS (SEPSIS)
1000.0000 mL | Freq: Once | INTRAVENOUS | Status: AC
  Administered 2014-12-06: 1000 mL via INTRAVENOUS

## 2014-12-06 MED ORDER — KETOROLAC TROMETHAMINE 30 MG/ML IJ SOLN
30.0000 mg | Freq: Once | INTRAMUSCULAR | Status: AC
  Administered 2014-12-06: 30 mg via INTRAVENOUS
  Filled 2014-12-06: qty 1

## 2014-12-06 NOTE — Progress Notes (Signed)
Patient ID: Kristi Martin, female   DOB: May 26, 1970, 44 y.o.   MRN: [id number]   Subjective:    Patient ID: Kristi Martin, female    DOB: 25-Nov-1970, 44 y.o.   MRN: [id number]  Patient presents for Pain/ Edema   patient here with urgent symptoms. Around 4:00 in the morning she states that she began having sharp pains on the right side of her head her vision when out and she had numbness. This is happening about 4 times since 4:00 this morning. She's also had tingling and severe burning in her left arm and in her feet. She has no neuropathy in both feet as well as concern for carpal tunnel in the hands but states that this feels worse. She's been taking her medicines as prescribed. She denies any chest pain or shortness of breath. She denies feeling anxious or stressed recently. When asked why she did not go to the emergency room with a recurrent symptom she states that her boyfriend advised her that she will be fine. Today in the office she is still having pain and numbness on the right side of her face as well as the tingling and burning sensation in her left arm. She states that everything is blurry out of the right eye but she is able to see clearly out of the left eye. He states that she feels off balance and is dizzy    Review Of Systems:  GEN- denies fatigue, fever, weight loss,weakness, recent illness HEENT- denies eye drainage, +change in vision, nasal discharge, CVS- denies chest pain, palpitations RESP- denies SOB, cough, wheeze ABD- denies N/V, change in stools, abd pain GU- denies dysuria, hematuria, dribbling, incontinence MSK- +joint pain, muscle aches, injury Neuro- + headache, +dizziness, syncope, seizure activity       Objective:    BP 132/72 mmHg  Pulse 78  Temp(Src) 97.9 F (36.6 C) (Oral)  Resp 18  Ht 5\' 6"  (1.676 m)  Wt 279 lb (126.554 kg)  BMI 45.05 kg/m2 GEN- NAD, alert and oriented x3 HEENT- PERRL, EOMI, non injected sclera, pink conjunctiva, MMM,  oropharynx clear, decreased vision per report, Neck- Supple, no bruit  CVS- RRR, no murmur RESP-CTAB Neuro- Decreased sensation right forehead and temple, CNII-XII intact, normal speech, no facial droop. Strength decreased Left ? Due to pain as she would withdraw during the exam, equal ROM lower ext, holding to wall to ambulate  Psych- anxious appearing, not depressed, no hallucinations, normal speech EXT- No edema Pulses- Radial, DP- 2+        Assessment & Plan:      Problem List Items Addressed This Visit    None    Visit Diagnoses    Stroke-like symptoms    -  Primary    Concern for Stroke or Amarosis Fug[place], other CNS pathology based on history, send urgently to ER for CT scan work up She has mild CKD, but no other cardiac risk factors       Note: This dictation was prepared with Dragon dictation along with smaller phrase technology. Any transcriptional errors that result from this process are unintentional.

## 2014-12-06 NOTE — ED Provider Notes (Signed)
The patient is a 44 year old female, she presents with a headache which is around her right eye, it involves her right forehead, feels like a stabbing sensation as if somebody is stabbing a pencil in that area, that sharp and stabbing sensation is intermittent, it comes and goes and sometimes she is pain-free. It started yesterday at 4:00 in the afternoon. It is associated with severe paresthesias of the bilateral feet, she has hyperesthesias in that location, she has no weakness of the legs and is able to straight leg raise bilaterally without difficulty and bend at the knees without difficulty. She has normal strength of the right upper extremity, normal sensation of the right upper extremity, decreased grip strength on the left though she has normal strength at the shoulder and elbow, she has decreased sensation of the left upper extremity from the shoulder through the fingertips. She has cranial nerves III through XII are normal except for decreased sensation on the right side of the face, there is no facial droop, no slurred speech, no pronator drift. She has no edema of the legs but does have bilateral swelling at the ankles.  Etiology of the patient's symptoms is unclear, would consider neurologic etiologies such as complicated migraine, inflammatory syndrome, less likely to be tumor, stroke, subarachnoid, CT scan has been ordered, labs, medications, reevaluate.  Medical screening examination/treatment/procedure(s) were conducted as a shared visit with non-physician practitioner(s) and myself.  I personally evaluated the patient during the encounter.  Clinical Impression:   Final diagnoses:  Headache, unspecified headache type         Noemi Chapel, MD 12/10/14 2030

## 2014-12-06 NOTE — ED Notes (Signed)
Pt reports had sharp pain in r side of head and face yesterday.  Reports couldn't see out of r eye and was dizzy.  Pt says symptoms lasted approx 55min.  Today pt woke up at 4 am with dizziness, r sided facial numbness and pain, intermittent loss of vision in r eye and blurred vision in r eye.  Pt also c/o difficulty gripping with left hand and c/o burning and swelling in feet.  PT went to pcp's office today and was sent here for further eval.

## 2014-12-06 NOTE — Telephone Encounter (Signed)
Received call from patient.   Reports that she continues to have increased swelling in B ankles/ feet, and now she has tingling in hands and feet. States that she has intermittent numbness in B hands and feet.   Advised that she needs to see MD for further recommendations. Advised that last appointment was not kept.   Appointment scheduled for today.

## 2014-12-06 NOTE — Discharge Instructions (Signed)
[name], [name] meeting you! Please follow-up with your primary care provider for your visit with [place] today.  SEEK IMMEDIATE MEDICAL CARE IF:  Your headache becomes severe.  You have repeated vomiting.  You have a stiff neck.  You have a loss of vision.  You have problems with speech.  You have pain in the eye or ear.  You have muscular weakness or loss of muscle control.  You lose your balance or have trouble walking.  You feel faint or pass out.  You have confusion. Feel better soon!  S. Wendie Simmer, PA-C

## 2014-12-06 NOTE — ED Notes (Signed)
MD at bedside. 

## 2014-12-06 NOTE — ED Notes (Signed)
PA at bedside.

## 2014-12-06 NOTE — ED Provider Notes (Signed)
CSN: [id number]     Arrival date & time 12/06/14  1443 History   First MD Initiated Contact with Patient 12/06/14 1501     Chief Complaint  Patient presents with  . visual changes   . facial numbness    HPI  44 yo F PMH migraines, sciatica, DDD pw headache and visual changes since yesterday. She states she developed a right sided headache watching TV. She describes the HA as 10/10, sharp, radiating down through her eyes. She took gabapentin, hydrocodone, pantoprazole with minimal relief. The headache wore off after a few hours. She went to bed and woke up with the same HA. She endorses blurred vision out of right eye, worst HA of life. She denies fever LOC, trauma, N/V, loss of bladder/bowel control.   Past Medical History  Diagnosis Date  . Left knee pain   . Migraine   . Cancer ([place])     cervical cx at age 1  . Cervical cancer ([place])   . Chronic back pain   . Sciatica   . Anemia   . Chronic kidney disease   . Hypotension   . Vaginal Pap smear, abnormal   . DDD (degenerative disc disease), lumbar     Facet arthritis  . Peripheral neuropathy [facility])    Past Surgical History  Procedure Laterality Date  . Tubal ligation    . Cholecystectomy N/A 01/20/2013    Procedure: LAPAROSCOPIC CHOLECYSTECTOMY WITH INTRAOPERATIVE CHOLANGIOGRAM;  Surgeon: Shann Medal, MD;  Location: [facility];  Service: General;  Laterality: N/A;   Family History  Problem Relation Age of Onset  . Cancer Mother   . Colon cancer Mother   . Breast cancer Maternal Grandmother   . Heart disease Maternal Grandmother   . Breast cancer Paternal Grandmother   . Heart disease Paternal Grandmother   . Hypertension Paternal Grandmother   . Alcohol abuse Father   . Drug abuse Father   . Asthma Brother   . Cancer Paternal Grandfather    Social History  Substance Use Topics  . Smoking status: Never Smoker   . Smokeless tobacco: Never Used  . Alcohol Use: 0.0 oz/week    0 Standard drinks or equivalent per week    Comment: occasionally   OB History    Gravida Para Term Preterm AB TAB SAB Ectopic Multiple Living   9 7 5 2 2  2   7      Review of Systems Ten systems are reviewed and are negative for acute change except as noted in the HPI   Allergies  Benadryl and Onion  Home Medications   Prior to Admission medications   Medication Sig Start Date End Date Taking? Authorizing Provider  gabapentin (NEURONTIN) 300 MG capsule Take 1 capsule twice a day and 2 at bedtime Patient taking differently: Take 300-600 mg by mouth 3 (three) times daily. Take 1 capsule in the morning, 1 capsule in the afternoon, and 2 capsules at bedtime. 10/18/14  Yes Alycia Rossetti, MD  HYDROcodone-acetaminophen (NORCO/VICODIN) 5-325 MG tablet Take 1-2 tablets by mouth 2 (two) times daily as needed. Patient taking differently: Take 1-2 tablets by mouth 2 (two) times daily as needed for moderate pain.  11/17/14  Yes Alycia Rossetti, MD  pantoprazole (PROTONIX) 40 MG tablet Take 1 tablet (40 mg total) by mouth 2 (two) times daily. Patient taking differently: Take 40 mg by mouth 3 (three) times daily.  09/06/14  Yes Alycia Rossetti, MD  methocarbamol (ROBAXIN) 500 MG  tablet Take 1 tablet (500 mg total) by mouth 2 (two) times daily as needed for muscle spasms. Patient not taking: Reported on 12/06/2014 10/18/14   Alycia Rossetti, MD  ondansetron (ZOFRAN ODT) 4 MG disintegrating tablet Take 1 tablet (4 mg total) by mouth every 8 (eight) hours as needed for nausea or vomiting. 09/06/14   Alycia Rossetti, MD  promethazine (PHENERGAN) 25 MG tablet Take 1 tablet (25 mg total) by mouth every 8 (eight) hours as needed for nausea or vomiting. 10/05/14   Davonna Belling, MD   BP 121/77 mmHg  Pulse 72  Temp(Src) 98.2 F (36.8 C) (Oral)  Resp 16  Ht 5\' 4"  (1.626 m)  Wt 272 lb (123.378 kg)  BMI 46.67 kg/m2  SpO2 94%  LMP 11/29/2014 Physical Exam  Constitutional: She is oriented to person, place, and time. She appears well-developed  and well-nourished. No distress.  HENT:  Head: Normocephalic and atraumatic.  Right Ear: External ear normal.  Left Ear: External ear normal.  Nose: Nose normal.  Mouth/Throat: Oropharynx is clear and moist. No oropharyngeal exudate.  Eyes: Conjunctivae are normal. Pupils are equal, round, and reactive to light. Right eye exhibits no discharge. Left eye exhibits no discharge. No scleral icterus.  Pain when looking right on EOM  Neck: Normal range of motion. No tracheal deviation present.  Cardiovascular: Normal rate, regular rhythm, normal heart sounds and intact distal pulses.  Exam reveals no gallop and no friction rub.   No murmur heard. Pulmonary/Chest: Effort normal and breath sounds normal. No respiratory distress. She has no wheezes. She has no rales. She exhibits no tenderness.  Abdominal: Soft. Bowel sounds are normal. She exhibits no distension and no mass. There is no tenderness. There is no rebound and no guarding.  Musculoskeletal: Normal range of motion. She exhibits no edema or tenderness.  Lymphadenopathy:    She has no cervical adenopathy.  Neurological: She is alert and oriented to person, place, and time. Coordination normal.  Decreased sensation right side of face and left arm and left leg. Hyperesthesias bilateral plantar surface of feet.   Skin: Skin is warm and dry. No rash noted. She is not diaphoretic. No erythema.  Psychiatric: She has a normal mood and affect. Her behavior is normal.  Nursing note and vitals reviewed.   ED Course  Procedures (including critical care time) Labs Review Labs Reviewed  BASIC METABOLIC PANEL - Abnormal; Notable for the following:    Potassium 3.3 (*)    Glucose, Bld 105 (*)    Creatinine, Ser 1.22 (*)    Calcium 8.2 (*)    GFR calc non Af Amer 52 (*)    All other components within normal limits  CBC - Abnormal; Notable for the following:    Hemoglobin 9.7 (*)    HCT 31.2 (*)    MCH 24.7 (*)    RDW 16.2 (*)    All other  components within normal limits    Imaging Review CT Head Wo Contrast (Final result) Result time: 12/06/14 16:33:53   Final result by Rad Results In Interface (12/06/14 16:33:53)   Narrative:   CLINICAL DATA: Hypertension  EXAM: CT HEAD WITHOUT CONTRAST  TECHNIQUE: Contiguous axial images were obtained from the base of the skull through the vertex without intravenous contrast.  COMPARISON: 12/07/2013  FINDINGS: The brain parenchyma, ventricular system, and extra-axial space are within normal limits. There is no mass effect, midline shift, or acute hemorrhage. Mastoid air cells are clear. Visualized paranasal sinuses  are clear. The cranium is intact.  IMPRESSION: Negative head CT.   Electronically Signed By: Marybelle Killings M.D. On: 12/06/2014 16:33   I have personally reviewed and evaluated these images and lab results as part of my medical decision-making.   MDM   Final diagnoses:  Headache, unspecified headache type   Patient with migraine history. Less likely SAH; however, she describes her HA as the worst one of her life.   Will give headache cocktail.  Dr. Sabra Heck to see patient. Will CT scan.   CT negative for acute process. Creatinine slightly elevated. Otherwise, labs unremarkable. HA dehydration vs. Inflammatory vs. Migraine.   Upon re-evaluation, patient feeling much better. Her mother is present at bedside. Explained results to patient and advised follow-up with primary care upon discharge. Patient can be safely discharged home. Discussed reasons for return. Patient and mother in understanding and agreement with the plan.   [place] Lions, PA-C 12/10/14 1426  Noemi Chapel, MD 12/10/14 2030

## 2014-12-06 NOTE — Patient Instructions (Signed)
Go directly to ER, you need CT scan done  And labs

## 2014-12-12 ENCOUNTER — Telehealth: Payer: Self-pay | Admitting: Family Medicine

## 2014-12-12 NOTE — Telephone Encounter (Signed)
Call placed to patient and patient made aware.  

## 2014-12-12 NOTE — Telephone Encounter (Signed)
No early refill, she gets for 30 days, pharmacy will not fill either

## 2014-12-12 NOTE — Telephone Encounter (Signed)
Pharmacy:Pick up   Medication:HYDROcodone-acetaminophen (NORCO/VICODIN) 5-325   Qty:  Sig:  Physician:Dr. Eland  Patient's phone [phone]  Patient is requeting picking this up early she only has 2 pills left and she is trying to avoid the ER.

## 2014-12-12 NOTE — Telephone Encounter (Signed)
Ok to refill??  Last office visit 12/06/2014.  Last refill 11/17/2014.

## 2014-12-18 ENCOUNTER — Other Ambulatory Visit: Payer: Self-pay | Admitting: Family Medicine

## 2014-12-18 ENCOUNTER — Other Ambulatory Visit: Payer: Self-pay | Admitting: *Deleted

## 2014-12-18 MED ORDER — HYDROCODONE-ACETAMINOPHEN 5-325 MG PO TABS: 1.0000 | ORAL_TABLET | Freq: Two times a day (BID) | ORAL | Status: DC | PRN

## 2014-12-18 NOTE — Progress Notes (Signed)
Patient aware.  Appointment cancelled.

## 2014-12-18 NOTE — Progress Notes (Signed)
Pt needs the nerve conduction test done, she can move follow up until after that Pain meds refilled

## 2014-12-18 NOTE — Telephone Encounter (Signed)
Patient requesting refill on Hydrocodone.   Ok to refill??  Last office visit 12/06/2014.  Last refill 11/17/2014.

## 2014-12-19 NOTE — Telephone Encounter (Signed)
MD approved.   Patient picked up prescription on 12/18/2014.

## 2014-12-20 ENCOUNTER — Ambulatory Visit: Payer: No Typology Code available for payment source | Admitting: Family Medicine

## 2015-01-14 ENCOUNTER — Encounter (HOSPITAL_COMMUNITY): Payer: Self-pay | Admitting: Emergency Medicine

## 2015-01-14 ENCOUNTER — Emergency Department (HOSPITAL_COMMUNITY): Payer: No Typology Code available for payment source

## 2015-01-14 ENCOUNTER — Emergency Department (HOSPITAL_COMMUNITY)
Admission: EM | Admit: 2015-01-14 | Discharge: 2015-01-14 | Disposition: A | Discharge status: Home / Self Care | Payer: No Typology Code available for payment source | Attending: Emergency Medicine | Admitting: Emergency Medicine

## 2015-01-14 DIAGNOSIS — Y998 Other external cause status: Secondary | ICD-10-CM | POA: Diagnosis not present

## 2015-01-14 DIAGNOSIS — Y92002 Bathroom of unspecified non-institutional (private) residence single-family (private) house as the place of occurrence of the external cause: Secondary | ICD-10-CM | POA: Insufficient documentation

## 2015-01-14 DIAGNOSIS — G43909 Migraine, unspecified, not intractable, without status migrainosus: Secondary | ICD-10-CM | POA: Insufficient documentation

## 2015-01-14 DIAGNOSIS — Z8541 Personal history of malignant neoplasm of cervix uteri: Secondary | ICD-10-CM | POA: Diagnosis not present

## 2015-01-14 DIAGNOSIS — Y9389 Activity, other specified: Secondary | ICD-10-CM | POA: Diagnosis not present

## 2015-01-14 DIAGNOSIS — G8929 Other chronic pain: Secondary | ICD-10-CM | POA: Diagnosis not present

## 2015-01-14 DIAGNOSIS — G629 Polyneuropathy, unspecified: Secondary | ICD-10-CM | POA: Diagnosis not present

## 2015-01-14 DIAGNOSIS — S59902A Unspecified injury of left elbow, initial encounter: Secondary | ICD-10-CM | POA: Insufficient documentation

## 2015-01-14 DIAGNOSIS — W01198A Fall on same level from slipping, tripping and stumbling with subsequent striking against other object, initial encounter: Secondary | ICD-10-CM | POA: Diagnosis not present

## 2015-01-14 DIAGNOSIS — S43402A Unspecified sprain of left shoulder joint, initial encounter: Secondary | ICD-10-CM | POA: Diagnosis not present

## 2015-01-14 DIAGNOSIS — Z79899 Other long term (current) drug therapy: Secondary | ICD-10-CM | POA: Diagnosis not present

## 2015-01-14 DIAGNOSIS — N189 Chronic kidney disease, unspecified: Secondary | ICD-10-CM | POA: Diagnosis not present

## 2015-01-14 DIAGNOSIS — Z862 Personal history of diseases of the blood and blood-forming organs and certain disorders involving the immune mechanism: Secondary | ICD-10-CM | POA: Diagnosis not present

## 2015-01-14 DIAGNOSIS — S4992XA Unspecified injury of left shoulder and upper arm, initial encounter: Secondary | ICD-10-CM | POA: Diagnosis present

## 2015-01-14 DIAGNOSIS — M1990 Unspecified osteoarthritis, unspecified site: Secondary | ICD-10-CM | POA: Insufficient documentation

## 2015-01-14 MED ORDER — DICLOFENAC SODIUM 75 MG PO TBEC: 75.0000 mg | DELAYED_RELEASE_TABLET | Freq: Two times a day (BID) | ORAL | Status: DC

## 2015-01-14 MED ORDER — IBUPROFEN 800 MG PO TABS
800.0000 mg | ORAL_TABLET | Freq: Once | ORAL | Status: AC
  Administered 2015-01-14: 800 mg via ORAL
  Filled 2015-01-14: qty 1

## 2015-01-14 NOTE — Discharge Instructions (Signed)
Shoulder Sprain °A shoulder sprain is a partial or complete tear in one of the tough, fiber-like tissues (ligaments) in the shoulder. The ligaments in the shoulder help to hold the shoulder in place. °CAUSES °This condition may be caused by: °· A fall. °· A hit to the shoulder. °· A twist of the arm. °RISK FACTORS °This condition is more likely to develop in: °· People who play sports. °· People who have problems with balance or coordination. °SYMPTOMS °Symptoms of this condition include: °· Pain when moving the shoulder. °· Limited ability to move the shoulder. °· Swelling and tenderness on top of the shoulder. °· Warmth in the shoulder. °· A change in the shape of the shoulder. °· Redness or bruising on the shoulder. °DIAGNOSIS °This condition is diagnosed with a physical exam. During the exam, you may be asked to do simple exercises with your shoulder. You may also have imaging tests, such as X-rays, MRI, or a CT scan. These tests can show how severe the sprain is. °TREATMENT °This condition may be treated with: °· Rest. °· Pain medicine. °· Ice. °· A sling or brace. This is used to keep the arm still while the shoulder is healing. °· Physical therapy or rehabilitation exercises. These help to improve the range of motion and strength of the shoulder. °· Surgery (rare). Surgery may be needed if the sprain caused a joint to become unstable. Surgery may also be needed to reduce pain. °Some people may develop ongoing shoulder pain or lose some range of motion in the shoulder. However, most people do not develop long-term problems. °HOME CARE INSTRUCTIONS °· Rest. °· Take over-the-counter and prescription medicines only as told by your health care provider. °· If directed, apply ice to the area: °¨ Put ice in a plastic bag. °¨ Place a towel between your skin and the bag. °¨ Leave the ice on for 20 minutes, 2-3 times per day. °· If you were given a shoulder sling or brace: °¨ Wear it as told. °¨ Remove it to shower or  bathe. °¨ Move your arm only as much as told by your health care provider, but keep your hand moving to prevent swelling. °· If you were shown how to do any exercises, do them as told by your health care provider. °· Keep all follow-up visits as told by your health care provider. This is important. °SEEK MEDICAL CARE IF: °· Your pain gets worse. °· Your pain is not relieved with medicines. °· You have increased redness or swelling. °SEEK IMMEDIATE MEDICAL CARE IF: °· You have a fever. °· You cannot move your arm or shoulder. °· You develop numbness or tingling in your arms, hands, or fingers. °  °This information is not intended to replace advice given to you by your health care provider. Make sure you discuss any questions you have with your health care provider. °  °Document Released: 06/08/2008 Document Revised: 10/11/2014 Document Reviewed: 05/15/2014 °Elsevier Interactive Patient Education ©2016 [employer] ° °

## 2015-01-14 NOTE — ED Notes (Signed)
Patient c/o left shoulder pain. Per patient fell last night while ambulating to bathroom, hitting left shoulder on door frame. Denies hitting head or LOC. Patient denies taking anything for pain.

## 2015-01-14 NOTE — ED Provider Notes (Signed)
CSN: [id number]     Arrival date & time 01/14/15  1122 History  By signing my name below, I, Kristi Martin, attest that this documentation has been prepared under the direction and in the presence of Kem Parkinson, PA-C Electronically Signed: Soijett Martin, ED Scribe. 01/14/2015. 12:29 PM.   Chief Complaint  Patient presents with  . Shoulder Pain     The history is provided by the patient. No language interpreter was used.    Kristi Martin is a 44 y.o. female who presents to the Emergency Department complaining of worsening, constant left shoulder pain onset last night. Pt reports that she was ambulating to the bathroom when fell and she hit her left shoulder on the door frame. She notes that there was initial left shoulder pain and that her left shoulder pain worsened when she woke up this morning. Pt left shoulder pain radiates to her left upper arm. Pt is having associated symptoms of left elbow pain. She notes that she has not tried any medications for the relief of her symptoms. She denies hitting her head, LOC, left wrist pain, numbness, tingling, weakness, color change, wound, rash, and any other symptoms.    Past Medical History  Diagnosis Date  . Left knee pain   . Migraine   . Cancer ([place])     cervical cx at age 54  . Cervical cancer ([place])   . Chronic back pain   . Sciatica   . Anemia   . Chronic kidney disease   . Hypotension   . Vaginal Pap smear, abnormal   . DDD (degenerative disc disease), lumbar     Facet arthritis  . Peripheral neuropathy [facility])    Past Surgical History  Procedure Laterality Date  . Tubal ligation    . Cholecystectomy N/A 01/20/2013    Procedure: LAPAROSCOPIC CHOLECYSTECTOMY WITH INTRAOPERATIVE CHOLANGIOGRAM;  Surgeon: Shann Medal, MD;  Location: [facility];  Service: General;  Laterality: N/A;   Family History  Problem Relation Age of Onset  . Cancer Mother   . Colon cancer Mother   . Breast cancer Maternal Grandmother   . Heart disease  Maternal Grandmother   . Breast cancer Paternal Grandmother   . Heart disease Paternal Grandmother   . Hypertension Paternal Grandmother   . Alcohol abuse Father   . Drug abuse Father   . Asthma Brother   . Cancer Paternal Grandfather    Social History  Substance Use Topics  . Smoking status: Never Smoker   . Smokeless tobacco: Never Used  . Alcohol Use: 0.0 oz/week    0 Standard drinks or equivalent per week     Comment: occasionally   OB History    Gravida Para Term Preterm AB TAB SAB Ectopic Multiple Living   9 7 5 2 2  2   7      Review of Systems  Cardiovascular: Negative for chest pain.  Gastrointestinal: Negative for nausea and vomiting.  Musculoskeletal: Positive for arthralgias. Negative for joint swelling and neck pain.  Skin: Negative for color change, rash and wound.  Neurological: Negative for syncope, weakness, numbness and headaches.       No tingling      Allergies  Benadryl and Onion  Home Medications   Prior to Admission medications   Medication Sig Start Date End Date Taking? Authorizing Provider  gabapentin (NEURONTIN) 300 MG capsule Take 1 capsule twice a day and 2 at bedtime Patient taking differently: Take 300-600 mg by mouth 3 (three)  times daily. Take 1 capsule in the morning, 1 capsule in the afternoon, and 2 capsules at bedtime. 10/18/14  Yes Alycia Rossetti, MD  HYDROcodone-acetaminophen (NORCO/VICODIN) 5-325 MG tablet Take 1-2 tablets by mouth 2 (two) times daily as needed. 12/18/14  Yes Alycia Rossetti, MD  methocarbamol (ROBAXIN) 500 MG tablet Take 1 tablet (500 mg total) by mouth 2 (two) times daily as needed for muscle spasms. 10/18/14  Yes Alycia Rossetti, MD  ondansetron (ZOFRAN ODT) 4 MG disintegrating tablet Take 1 tablet (4 mg total) by mouth every 8 (eight) hours as needed for nausea or vomiting. 09/06/14  Yes Alycia Rossetti, MD  pantoprazole (PROTONIX) 40 MG tablet Take 1 tablet (40 mg total) by mouth 2 (two) times daily. Patient  taking differently: Take 40 mg by mouth 3 (three) times daily.  09/06/14  Yes Alycia Rossetti, MD  promethazine (PHENERGAN) 25 MG tablet Take 1 tablet (25 mg total) by mouth every 8 (eight) hours as needed for nausea or vomiting. Patient not taking: Reported on 01/14/2015 10/05/14   Davonna Belling, MD   BP 107/58 mmHg  Pulse 79  Temp(Src) 97.5 F (36.4 C) (Oral)  Resp 18  Ht 5\' 4"  (1.626 m)  Wt 274 lb (124.286 kg)  BMI 47.01 kg/m2  SpO2 100%  LMP 01/03/2015 Physical Exam  Constitutional: She is oriented to person, place, and time. She appears well-developed and well-nourished. No distress.  HENT:  Head: Normocephalic and atraumatic.  Eyes: EOM are normal.  Neck: Normal range of motion. Neck supple.  Cardiovascular: Normal rate, regular rhythm and normal heart sounds.  Exam reveals no gallop and no friction rub.   No murmur heard. Pulmonary/Chest: Effort normal and breath sounds normal. No respiratory distress. She has no wheezes. She has no rales.  Musculoskeletal: She exhibits tenderness. She exhibits no edema.       Left shoulder: She exhibits decreased range of motion (due to pain) and tenderness. She exhibits no swelling, no crepitus and no deformity.  Left shoulder pain is worse with abduction. No distal tenderness. Pulse and distal sensation intact. No edema, no bony deformity.    Neurological: She is alert and oriented to person, place, and time.  Skin: Skin is warm and dry.  Psychiatric: She has a normal mood and affect. Her behavior is normal.  Nursing note and vitals reviewed.   ED Course  Procedures (including critical care time) DIAGNOSTIC STUDIES: Oxygen Saturation is 100% on RA, nl by my interpretation.    COORDINATION OF CARE: 12:27 PM Discussed treatment plan with pt at bedside which includes left shoulder xray and pt agreed to plan.  12:47 PM- Per pt [place] [facility], pt was Rx quantity of #100 5-325 mg hydrocodone on 12/19/14 by PCP, Dr. Vic Blackbird.    Labs Review Labs Reviewed - No data to display  Imaging Review Dg Shoulder Left  01/14/2015  CLINICAL DATA:  Acute left shoulder pain, recent fall last night EXAM: LEFT SHOULDER - 2+ VIEW COMPARISON:  None. FINDINGS: Mild AC joint degenerative change with bony spurring. Normal left shoulder alignment. No subluxation or dislocation. No acute fracture. Left upper lobe is clear. Visualized ribs intact. IMPRESSION: Mild AC joint degenerative change. No other acute osseous finding by plain radiography Electronically Signed   By: Jerilynn Mages.  Shick M.D.   On: 01/14/2015 12:30   I have personally reviewed and evaluated these images as part of my medical decision-making.    MDM   Final diagnoses:  Shoulder  sprain, left, initial encounter    Pt is well appearing.  XR neg for fx.  Likely sprain.  No concerning sx's for septic joint.  Pt advised to arrange ortho f/u if not improving with symptomatic tx.  rx for diclofenac  I personally performed the services described in this documentation, which was scribed in my presence. The recorded information has been reviewed and is accurate.    Kem Parkinson, PA-C 01/17/15 2136  Fredia Sorrow, MD 01/24/15 [phone]

## 2015-01-17 ENCOUNTER — Telehealth: Payer: Self-pay | Admitting: *Deleted

## 2015-01-17 MED ORDER — HYDROCODONE-ACETAMINOPHEN 5-325 MG PO TABS: 1.0000 | ORAL_TABLET | Freq: Two times a day (BID) | ORAL | Status: DC | PRN

## 2015-01-17 NOTE — Telephone Encounter (Signed)
Patient in office and requested refill on Hydrocodone.   MD made aware and approved.   Prescription printed.

## 2015-01-27 ENCOUNTER — Encounter (HOSPITAL_COMMUNITY): Payer: Self-pay | Admitting: *Deleted

## 2015-01-27 ENCOUNTER — Emergency Department (HOSPITAL_COMMUNITY)
Admission: EM | Admit: 2015-01-27 | Discharge: 2015-01-27 | Disposition: A | Discharge status: Home / Self Care | Payer: No Typology Code available for payment source | Attending: Emergency Medicine | Admitting: Emergency Medicine

## 2015-01-27 DIAGNOSIS — G8929 Other chronic pain: Secondary | ICD-10-CM | POA: Diagnosis not present

## 2015-01-27 DIAGNOSIS — Z862 Personal history of diseases of the blood and blood-forming organs and certain disorders involving the immune mechanism: Secondary | ICD-10-CM | POA: Insufficient documentation

## 2015-01-27 DIAGNOSIS — Z9851 Tubal ligation status: Secondary | ICD-10-CM | POA: Diagnosis not present

## 2015-01-27 DIAGNOSIS — Z8541 Personal history of malignant neoplasm of cervix uteri: Secondary | ICD-10-CM | POA: Insufficient documentation

## 2015-01-27 DIAGNOSIS — G629 Polyneuropathy, unspecified: Secondary | ICD-10-CM | POA: Insufficient documentation

## 2015-01-27 DIAGNOSIS — Z79899 Other long term (current) drug therapy: Secondary | ICD-10-CM | POA: Insufficient documentation

## 2015-01-27 DIAGNOSIS — N939 Abnormal uterine and vaginal bleeding, unspecified: Secondary | ICD-10-CM | POA: Insufficient documentation

## 2015-01-27 DIAGNOSIS — G43909 Migraine, unspecified, not intractable, without status migrainosus: Secondary | ICD-10-CM | POA: Diagnosis not present

## 2015-01-27 DIAGNOSIS — R0781 Pleurodynia: Secondary | ICD-10-CM | POA: Diagnosis not present

## 2015-01-27 DIAGNOSIS — N189 Chronic kidney disease, unspecified: Secondary | ICD-10-CM | POA: Insufficient documentation

## 2015-01-27 DIAGNOSIS — Z791 Long term (current) use of non-steroidal anti-inflammatories (NSAID): Secondary | ICD-10-CM | POA: Insufficient documentation

## 2015-01-27 DIAGNOSIS — Z3202 Encounter for pregnancy test, result negative: Secondary | ICD-10-CM | POA: Insufficient documentation

## 2015-01-27 DIAGNOSIS — R109 Unspecified abdominal pain: Secondary | ICD-10-CM

## 2015-01-27 DIAGNOSIS — M5430 Sciatica, unspecified side: Secondary | ICD-10-CM | POA: Insufficient documentation

## 2015-01-27 LAB — URINE MICROSCOPIC-ADD ON

## 2015-01-27 LAB — URINALYSIS, ROUTINE W REFLEX MICROSCOPIC
Bilirubin Urine: NEGATIVE
Glucose, UA: NEGATIVE mg/dL
Ketones, ur: NEGATIVE mg/dL
NITRITE: NEGATIVE
PH: 6.5 (ref 5.0–8.0)
Protein, ur: NEGATIVE mg/dL
SPECIFIC GRAVITY, URINE: 1.015 (ref 1.005–1.030)

## 2015-01-27 LAB — PREGNANCY, URINE: Preg Test, Ur: NEGATIVE

## 2015-01-27 MED ORDER — IBUPROFEN 800 MG PO TABS: 800.0000 mg | ORAL_TABLET | Freq: Three times a day (TID) | ORAL | Status: DC

## 2015-01-27 MED ORDER — KETOROLAC TROMETHAMINE 60 MG/2ML IM SOLN
60.0000 mg | Freq: Once | INTRAMUSCULAR | Status: AC
  Administered 2015-01-27: 60 mg via INTRAMUSCULAR
  Filled 2015-01-27: qty 2

## 2015-01-27 NOTE — ED Notes (Signed)
Pt states she is having right sided flank pain starting yesterday. Pt states she also started her period yesterday and has been passing large clots.

## 2015-01-27 NOTE — ED Provider Notes (Signed)
CSN: [id number]     Arrival date & time 01/27/15  1440 History   First MD Initiated Contact with Patient 01/27/15 1456     Chief Complaint  Patient presents with  . Flank Pain  . Vaginal Bleeding     (Consider location/radiation/quality/duration/timing/severity/associated sxs/prior Treatment) HPI  The pt has hx of chronic pain - she is on daily robaxin and hydrocodone - states that she has hx of L sided kidney infections int he past - but has been doing well until last 24 hours when she developed a R sided flank pain - sharp - worse with movement, breathing and touching the side.  Sx are mild, persistent, no vomiting, fevers or diarrhea - she does have some dysuria for the last 12 hours.  Past Medical History  Diagnosis Date  . Left knee pain   . Migraine   . Cancer ([place])     cervical cx at age 77  . Cervical cancer ([place])   . Chronic back pain   . Sciatica   . Anemia   . Chronic kidney disease   . Hypotension   . Vaginal Pap smear, abnormal   . DDD (degenerative disc disease), lumbar     Facet arthritis  . Peripheral neuropathy [facility])    Past Surgical History  Procedure Laterality Date  . Tubal ligation    . Cholecystectomy N/A 01/20/2013    Procedure: LAPAROSCOPIC CHOLECYSTECTOMY WITH INTRAOPERATIVE CHOLANGIOGRAM;  Surgeon: Shann Medal, MD;  Location: [facility];  Service: General;  Laterality: N/A;   Family History  Problem Relation Age of Onset  . Cancer Mother   . Colon cancer Mother   . Breast cancer Maternal Grandmother   . Heart disease Maternal Grandmother   . Breast cancer Paternal Grandmother   . Heart disease Paternal Grandmother   . Hypertension Paternal Grandmother   . Alcohol abuse Father   . Drug abuse Father   . Asthma Brother   . Cancer Paternal Grandfather    Social History  Substance Use Topics  . Smoking status: Never Smoker   . Smokeless tobacco: Never Used  . Alcohol Use: 0.0 oz/week    0 Standard drinks or equivalent per week   Comment: occasionally   OB History    Gravida Para Term Preterm AB TAB SAB Ectopic Multiple Living   9 7 5 2 2  2   7      Review of Systems  All other systems reviewed and are negative.     Allergies  Benadryl and Onion  Home Medications   Prior to Admission medications   Medication Sig Start Date End Date Taking? Authorizing Provider  diclofenac (VOLTAREN) 75 MG EC tablet Take 1 tablet (75 mg total) by mouth 2 (two) times daily. Take with food 01/14/15   Tammy Triplett, PA-C  gabapentin (NEURONTIN) 300 MG capsule Take 1 capsule twice a day and 2 at bedtime Patient taking differently: Take 300-600 mg by mouth 3 (three) times daily. Take 1 capsule in the morning, 1 capsule in the afternoon, and 2 capsules at bedtime. 10/18/14   Alycia Rossetti, MD  HYDROcodone-acetaminophen (NORCO/VICODIN) 5-325 MG tablet Take 1-2 tablets by mouth 2 (two) times daily as needed. 01/17/15   Alycia Rossetti, MD  ibuprofen (ADVIL,MOTRIN) 800 MG tablet Take 1 tablet (800 mg total) by mouth 3 (three) times daily. 01/27/15   Noemi Chapel, MD  methocarbamol (ROBAXIN) 500 MG tablet Take 1 tablet (500 mg total) by mouth 2 (two) times daily as  needed for muscle spasms. 10/18/14   Alycia Rossetti, MD  ondansetron (ZOFRAN ODT) 4 MG disintegrating tablet Take 1 tablet (4 mg total) by mouth every 8 (eight) hours as needed for nausea or vomiting. 09/06/14   Alycia Rossetti, MD  pantoprazole (PROTONIX) 40 MG tablet Take 1 tablet (40 mg total) by mouth 2 (two) times daily. Patient taking differently: Take 40 mg by mouth 3 (three) times daily.  09/06/14   Alycia Rossetti, MD  promethazine (PHENERGAN) 25 MG tablet Take 1 tablet (25 mg total) by mouth every 8 (eight) hours as needed for nausea or vomiting. Patient not taking: Reported on 01/14/2015 10/05/14   Davonna Belling, MD   BP 115/59 mmHg  Pulse 78  Temp(Src) 98.2 F (36.8 C) (Oral)  Resp 18  Ht 5\' 4"  (1.626 m)  Wt 280 lb (127.007 kg)  BMI 48.04 kg/m2  SpO2  100%  LMP 01/26/2015 Physical Exam  Constitutional: She appears well-developed and well-nourished. No distress.  HENT:  Head: Normocephalic and atraumatic.  Mouth/Throat: Oropharynx is clear and moist. No oropharyngeal exudate.  Eyes: Conjunctivae and EOM are normal. Pupils are equal, round, and reactive to light. Right eye exhibits no discharge. Left eye exhibits no discharge. No scleral icterus.  Neck: Normal range of motion. Neck supple. No JVD present. No thyromegaly present.  Cardiovascular: Normal rate, regular rhythm, normal heart sounds and intact distal pulses.  Exam reveals no gallop and no friction rub.   No murmur heard. Pulmonary/Chest: Effort normal and breath sounds normal. No respiratory distress. She has no wheezes. She has no rales.  Abdominal: Soft. Bowel sounds are normal. She exhibits no distension and no mass. There is no tenderness ( no abd ttp).  Genitourinary:  Deferred as pt has no abd pain and is on menstrual cycle - no d/c and hx of BTL - preg neg  Musculoskeletal: Normal range of motion. She exhibits tenderness ( very ttp over the R side / flank over the 9/10th ribs - even to light touch, no abd ttp.). She exhibits no edema.  Lymphadenopathy:    She has no cervical adenopathy.  Neurological: She is alert. Coordination normal.  Skin: Skin is warm and dry. No rash noted. No erythema.  No rash over side / flank  Psychiatric: She has a normal mood and affect. Her behavior is normal.  Nursing note and vitals reviewed.   ED Course  Procedures (including critical care time) Labs Review Labs Reviewed  URINALYSIS, ROUTINE W REFLEX MICROSCOPIC (NOT AT [facility]) - Abnormal; Notable for the following:    Hgb urine dipstick LARGE (*)    Leukocytes, UA TRACE (*)    All other components within normal limits  URINE MICROSCOPIC-ADD ON - Abnormal; Notable for the following:    Squamous Epithelial / LPF 0-5 (*)    Bacteria, UA FEW (*)    All other components within normal  limits  PREGNANCY, URINE    Imaging Review No results found. I have personally reviewed and evaluated these images and lab results as part of my medical decision-making.    MDM   Final diagnoses:  Vaginal bleeding  Flank pain    Normal  VS - she has possible UTI - though the pain is very superficial and does not seem likely pyelo / flank pain.  She has chronic pain - is on chronic vicodin and according to the notes is calling her doctors office frequently before it is time to refill the opiates for vicodin refills.  Toradol with improvement UA without signs of infection / bleeding Pt well appearing on d/c.  Meds given in ED:  Medications  ketorolac (TORADOL) injection 60 mg (60 mg Intramuscular Given 01/27/15 1535)    New Prescriptions   IBUPROFEN (ADVIL,MOTRIN) 800 MG TABLET    Take 1 tablet (800 mg total) by mouth 3 (three) times daily.      Noemi Chapel, MD 01/27/15 [phone]

## 2015-01-27 NOTE — Discharge Instructions (Signed)

## 2015-02-04 ENCOUNTER — Emergency Department (HOSPITAL_COMMUNITY): Payer: No Typology Code available for payment source

## 2015-02-04 ENCOUNTER — Emergency Department (HOSPITAL_COMMUNITY)
Admission: EM | Admit: 2015-02-04 | Discharge: 2015-02-04 | Disposition: A | Discharge status: Home / Self Care | Payer: No Typology Code available for payment source | Attending: Emergency Medicine | Admitting: Emergency Medicine

## 2015-02-04 DIAGNOSIS — R0789 Other chest pain: Secondary | ICD-10-CM | POA: Insufficient documentation

## 2015-02-04 DIAGNOSIS — Z791 Long term (current) use of non-steroidal anti-inflammatories (NSAID): Secondary | ICD-10-CM | POA: Insufficient documentation

## 2015-02-04 DIAGNOSIS — Z862 Personal history of diseases of the blood and blood-forming organs and certain disorders involving the immune mechanism: Secondary | ICD-10-CM | POA: Insufficient documentation

## 2015-02-04 DIAGNOSIS — G43909 Migraine, unspecified, not intractable, without status migrainosus: Secondary | ICD-10-CM | POA: Insufficient documentation

## 2015-02-04 DIAGNOSIS — N189 Chronic kidney disease, unspecified: Secondary | ICD-10-CM | POA: Insufficient documentation

## 2015-02-04 DIAGNOSIS — Z79899 Other long term (current) drug therapy: Secondary | ICD-10-CM | POA: Insufficient documentation

## 2015-02-04 DIAGNOSIS — Z8541 Personal history of malignant neoplasm of cervix uteri: Secondary | ICD-10-CM | POA: Insufficient documentation

## 2015-02-04 DIAGNOSIS — G8929 Other chronic pain: Secondary | ICD-10-CM | POA: Insufficient documentation

## 2015-02-04 DIAGNOSIS — M25512 Pain in left shoulder: Secondary | ICD-10-CM | POA: Insufficient documentation

## 2015-02-04 DIAGNOSIS — R079 Chest pain, unspecified: Secondary | ICD-10-CM

## 2015-02-04 LAB — CBC WITH DIFFERENTIAL/PLATELET
BASOS ABS: 0 10*3/uL (ref 0.0–0.1)
BASOS PCT: 1 %
EOS ABS: 0.1 10*3/uL (ref 0.0–0.7)
Eosinophils Relative: 2 %
HEMATOCRIT: 33.1 % — AB (ref 36.0–46.0)
Hemoglobin: 10.4 g/dL — ABNORMAL LOW (ref 12.0–15.0)
Lymphocytes Relative: 36 %
Lymphs Abs: 1.6 10*3/uL (ref 0.7–4.0)
MCH: 24.2 pg — ABNORMAL LOW (ref 26.0–34.0)
MCHC: 31.4 g/dL (ref 30.0–36.0)
MCV: 77 fL — ABNORMAL LOW (ref 78.0–100.0)
MONO ABS: 0.4 10*3/uL (ref 0.1–1.0)
MONOS PCT: 8 %
NEUTROS ABS: 2.4 10*3/uL (ref 1.7–7.7)
NEUTROS PCT: 53 %
Platelets: 292 10*3/uL (ref 150–400)
RBC: 4.3 MIL/uL (ref 3.87–5.11)
RDW: 16.7 % — AB (ref 11.5–15.5)
WBC: 4.5 10*3/uL (ref 4.0–10.5)

## 2015-02-04 LAB — COMPREHENSIVE METABOLIC PANEL
ALBUMIN: 3.3 g/dL — AB (ref 3.5–5.0)
ALT: 11 U/L — ABNORMAL LOW (ref 14–54)
ANION GAP: 8 (ref 5–15)
AST: 16 U/L (ref 15–41)
Alkaline Phosphatase: 82 U/L (ref 38–126)
BILIRUBIN TOTAL: 0.5 mg/dL (ref 0.3–1.2)
BUN: 14 mg/dL (ref 6–20)
CHLORIDE: 107 mmol/L (ref 101–111)
CO2: 24 mmol/L (ref 22–32)
Calcium: 8.7 mg/dL — ABNORMAL LOW (ref 8.9–10.3)
Creatinine, Ser: 1.27 mg/dL — ABNORMAL HIGH (ref 0.44–1.00)
GFR calc Af Amer: 58 mL/min — ABNORMAL LOW (ref >60)
GFR calc non Af Amer: 50 mL/min — ABNORMAL LOW (ref >60)
GLUCOSE: 99 mg/dL (ref 65–99)
POTASSIUM: 4 mmol/L (ref 3.5–5.1)
SODIUM: 139 mmol/L (ref 135–145)
TOTAL PROTEIN: 7 g/dL (ref 6.5–8.1)

## 2015-02-04 LAB — I-STAT TROPONIN, ED: Troponin i, poc: 0 ng/mL (ref 0.00–0.08)

## 2015-02-04 LAB — D-DIMER, QUANTITATIVE (NOT AT ARMC)

## 2015-02-04 LAB — LIPASE, BLOOD: Lipase: 36 U/L (ref 11–51)

## 2015-02-04 LAB — TROPONIN I

## 2015-02-04 MED ORDER — MORPHINE SULFATE (PF) 4 MG/ML IV SOLN
4.0000 mg | Freq: Once | INTRAVENOUS | Status: AC
  Administered 2015-02-04: 4 mg via INTRAVENOUS
  Filled 2015-02-04: qty 1

## 2015-02-04 MED ORDER — ONDANSETRON HCL 4 MG/2ML IJ SOLN
4.0000 mg | Freq: Once | INTRAMUSCULAR | Status: AC
  Administered 2015-02-04: 4 mg via INTRAVENOUS
  Filled 2015-02-04: qty 2

## 2015-02-04 MED ORDER — KETOROLAC TROMETHAMINE 30 MG/ML IJ SOLN
30.0000 mg | Freq: Once | INTRAMUSCULAR | Status: AC
  Administered 2015-02-04: 30 mg via INTRAVENOUS
  Filled 2015-02-04: qty 1

## 2015-02-04 NOTE — ED Provider Notes (Signed)
CSN: [id number]     Arrival date & time 02/04/15  1116 History  By signing my name below, I, [facility], attest that this documentation has been prepared under the direction and in the presence of Nat Christen, MD. Electronically Signed: Virgel Bouquet, ED Scribe. 02/04/2015. 12:08 PM.   Chief Complaint  Patient presents with  . Chest Pain   The history is provided by the patient. No language interpreter was used.    HPI Comments: Kristi Martin is a 45 y.o. female hx of stage 3 chronic kidney disease who presents to the Emergency Department complaining of moderate, constant, sharp and burning, gradually worsening, left-sided CP that radiates to her left shoulder onset last night 15 hours ago. Patient reports that she was driving this morning when pain worsened and she came to [facility] ED. She endorses associated SOB, diaphoresis, BLE edema, nausea, vomiting, and 30 pound weight gain over the past few months. Patient denies hx of CAD or other cardiovascular conditions. She denies fever.  PCP: Dr. Vic Blackbird  Past Medical History  Diagnosis Date  . Left knee pain   . Migraine   . Cancer ([place])     cervical cx at age 49  . Cervical cancer ([place])   . Chronic back pain   . Sciatica   . Anemia   . Chronic kidney disease   . Hypotension   . Vaginal Pap smear, abnormal   . DDD (degenerative disc disease), lumbar     Facet arthritis  . Peripheral neuropathy [facility])    Past Surgical History  Procedure Laterality Date  . Tubal ligation    . Cholecystectomy N/A 01/20/2013    Procedure: LAPAROSCOPIC CHOLECYSTECTOMY WITH INTRAOPERATIVE CHOLANGIOGRAM;  Surgeon: Shann Medal, MD;  Location: [facility];  Service: General;  Laterality: N/A;   Family History  Problem Relation Age of Onset  . Cancer Mother   . Colon cancer Mother   . Breast cancer Maternal Grandmother   . Heart disease Maternal Grandmother   . Breast cancer Paternal Grandmother   . Heart disease Paternal  Grandmother   . Hypertension Paternal Grandmother   . Alcohol abuse Father   . Drug abuse Father   . Asthma Brother   . Cancer Paternal Grandfather    Social History  Substance Use Topics  . Smoking status: Never Smoker   . Smokeless tobacco: Never Used  . Alcohol Use: 0.0 oz/week    0 Standard drinks or equivalent per week     Comment: occasionally   OB History    Gravida Para Term Preterm AB TAB SAB Ectopic Multiple Living   9 7 5 2 2  2   7      Review of Systems A complete 10 system review of systems was obtained and all systems are negative except as noted in the HPI and PMH.    Allergies  Benadryl and Onion  Home Medications   Prior to Admission medications   Medication Sig Start Date End Date Taking? Authorizing Provider  gabapentin (NEURONTIN) 300 MG capsule Take 1 capsule twice a day and 2 at bedtime Patient taking differently: Take 300-600 mg by mouth 3 (three) times daily. Take 1 capsule in the morning, 1 capsule in the afternoon, and 2 capsules at bedtime. 10/18/14  Yes Alycia Rossetti, MD  HYDROcodone-acetaminophen (NORCO/VICODIN) 5-325 MG tablet Take 1-2 tablets by mouth 2 (two) times daily as needed. Patient taking differently: Take 1-2 tablets by mouth 3 (three) times daily as  needed for moderate pain. Take 1 tablet in the morning, 1 tablet in the afternoon and 2 tablets at bedtime. 01/17/15  Yes Alycia Rossetti, MD  ibuprofen (ADVIL,MOTRIN) 800 MG tablet Take 1 tablet (800 mg total) by mouth 3 (three) times daily. 01/27/15  Yes Noemi Chapel, MD  methocarbamol (ROBAXIN) 500 MG tablet Take 1 tablet (500 mg total) by mouth 2 (two) times daily as needed for muscle spasms. 10/18/14  Yes Alycia Rossetti, MD  ondansetron (ZOFRAN ODT) 4 MG disintegrating tablet Take 1 tablet (4 mg total) by mouth every 8 (eight) hours as needed for nausea or vomiting. 09/06/14  Yes Alycia Rossetti, MD  pantoprazole (PROTONIX) 40 MG tablet Take 1 tablet (40 mg total) by mouth 2 (two)  times daily. Patient taking differently: Take 40 mg by mouth 3 (three) times daily.  09/06/14  Yes Alycia Rossetti, MD  diclofenac (VOLTAREN) 75 MG EC tablet Take 1 tablet (75 mg total) by mouth 2 (two) times daily. Take with food Patient not taking: Reported on 02/04/2015 01/14/15   Tammy Triplett, PA-C   BP 103/82 mmHg  Pulse 63  Temp(Src) 97.8 F (36.6 C) (Oral)  Resp 20  Ht 5\' 4"  (1.626 m)  Wt 280 lb (127.007 kg)  BMI 48.04 kg/m2  SpO2 99%  LMP 01/26/2015 Physical Exam  Constitutional: She is oriented to person, place, and time. She appears well-developed and well-nourished.  HENT:  Head: Normocephalic and atraumatic.  Eyes: Conjunctivae and EOM are normal. Pupils are equal, round, and reactive to light.  Neck: Normal range of motion. Neck supple.  Cardiovascular: Normal rate and regular rhythm.   Pulmonary/Chest: Effort normal and breath sounds normal.  Tender on left superior-lateral chest wall.  Abdominal: Soft. Bowel sounds are normal.  Musculoskeletal:  Pain with range of motion of left shoulder  Neurological: She is alert and oriented to person, place, and time.  Skin: Skin is warm and dry.  Psychiatric: She has a normal mood and affect. Her behavior is normal.  Nursing note and vitals reviewed.   ED Course  Procedures   DIAGNOSTIC STUDIES: Oxygen Saturation is 97% on RA, normal by my interpretation.    COORDINATION OF CARE: 12:01 PM Will order pain medication, cardiovascular labs, and chest x-ray. Discussed treatment plan with pt at bedside and pt agreed to plan.  Labs Review Labs Reviewed  CBC WITH DIFFERENTIAL/PLATELET - Abnormal; Notable for the following:    Hemoglobin 10.4 (*)    HCT 33.1 (*)    MCV 77.0 (*)    MCH 24.2 (*)    RDW 16.7 (*)    All other components within normal limits  COMPREHENSIVE METABOLIC PANEL - Abnormal; Notable for the following:    Creatinine, Ser 1.27 (*)    Calcium 8.7 (*)    Albumin 3.3 (*)    ALT 11 (*)    GFR calc non  Af Amer 50 (*)    GFR calc Af Amer 58 (*)    All other components within normal limits  LIPASE, BLOOD  D-DIMER, QUANTITATIVE (NOT AT [facility])  TROPONIN I  I-STAT TROPOININ, ED    Imaging Review Dg Chest 2 View  02/04/2015  CLINICAL DATA:  Initial encounter for chest pain and vomiting the began this morning. Pain radiates to left shoulder. EXAM: CHEST  2 VIEW COMPARISON:  06/11/2014. FINDINGS: Low lung volumes. The lungs are clear wiithout focal pneumonia, edema, pneumothorax or pleural effusion. Cardiopericardial silhouette is at upper limits of normal for  size. The visualized bony structures of the thorax are intact. IMPRESSION: No active cardiopulmonary disease. Electronically Signed   By: Misty Stanley M.D.   On: 02/04/2015 13:23   I have personally reviewed and evaluated these images and lab results as part of my medical decision-making.   EKG Interpretation   Date/Time:  Sunday February 04 2015 11:28:49 EST Ventricular Rate:  88 PR Interval:  155 QRS Duration: 67 QT Interval:  374 QTC Calculation: 452 R Axis:   33 Text Interpretation:  Sinus rhythm Low voltage, precordial leads Abnormal  R-wave progression, early transition Confirmed by Britanni Yarde  MD, Zeenat Jeanbaptiste ([id number])  on 02/04/2015 11:47:57 AM      MDM   Final diagnoses:  Chest pain, unspecified chest pain type   Screening tests including troponin 2 and d-dimer were negative for acute findings. Chest x-ray normal. EKG normal. Discussed test results with patient and her mother. She will follow-up with orthopedics for left shoulder discomfort  I personally performed the services described in this documentation, which was scribed in my presence. The recorded information has been reviewed and is accurate.     Nat Christen, MD 02/04/15 [phone]

## 2015-02-04 NOTE — ED Notes (Signed)
Pt reports LT sided, sharp CP that radiates to LT shoulder that began this morning. Pt also reports SOB and vomiting. States LT arm is burning. AOx4

## 2015-02-04 NOTE — ED Notes (Signed)
Pt rates pain as a 8 on pain scale, pt talking on cell phone at present time, no distress noted, pt and family updated on plan of care,

## 2015-02-04 NOTE — Discharge Instructions (Signed)
Ice pack to shoulder. Follow-up with Dr. Aline Brochure for your shoulder.

## 2015-02-04 NOTE — ED Notes (Signed)
Pt was using left arm at discharge gathering belongings in room, rates pain as a 5 on pain scale,

## 2015-02-07 ENCOUNTER — Ambulatory Visit (INDEPENDENT_AMBULATORY_CARE_PROVIDER_SITE_OTHER): Payer: Self-pay | Admitting: Family Medicine

## 2015-02-07 ENCOUNTER — Encounter: Payer: Self-pay | Admitting: Family Medicine

## 2015-02-07 VITALS — BP 122/84 | HR 80 | Temp 98.1°F | Resp 18 | Wt 282.0 lb

## 2015-02-07 DIAGNOSIS — F411 Generalized anxiety disorder: Secondary | ICD-10-CM

## 2015-02-07 DIAGNOSIS — M47816 Spondylosis without myelopathy or radiculopathy, lumbar region: Secondary | ICD-10-CM

## 2015-02-07 DIAGNOSIS — M5136 Other intervertebral disc degeneration, lumbar region: Secondary | ICD-10-CM

## 2015-02-07 DIAGNOSIS — M25512 Pain in left shoulder: Secondary | ICD-10-CM

## 2015-02-07 MED ORDER — METHYLPREDNISOLONE 4 MG PO TBPK: ORAL_TABLET | ORAL | Status: DC

## 2015-02-07 MED ORDER — DULOXETINE HCL 20 MG PO CPEP: 20.0000 mg | ORAL_CAPSULE | Freq: Every day | ORAL | Status: DC

## 2015-02-07 MED ORDER — HYDROCODONE-ACETAMINOPHEN 5-325 MG PO TABS: 1.0000 | ORAL_TABLET | Freq: Two times a day (BID) | ORAL | Status: DC | PRN

## 2015-02-07 NOTE — Assessment & Plan Note (Signed)
im concerned she has some underlying anxiety or even hidden depressive symptoms contributing to her "pain" and somatic complaints. Will try her on Cymbalta 20mg  once a day

## 2015-02-07 NOTE — Progress Notes (Signed)
Patient ID: Kristi Martin, female   DOB: 11/05/1970, 45 y.o.   MRN: [id number]   Subjective:    Patient ID: Kristi Martin, female    DOB: August 11, 1970, 45 y.o.   MRN: [id number]  Patient presents for Hospitalization Follow-up; Right arm pain - can not lift over head.; Referral to pain clinic; and Medication Refill  Pt here for F/U she has been to the ER 3 times since our last visit- I sent on on the 11/2 for stroke like symptoms which was ? Migraine related. She went back on 01/14/15 for shoulder pain after she hit it on a door frame given diclofenec, seen on 12/24 for flank pain and ? Vaginal bleeding by did not have GYN exam,  Seen again 02/04/15 for pain in her left shoulder associated with N/V, stated 30lb weight gain, which is not the case, work up was neg, she was found to be talking on her cell phone with no signs of pain and discharged home with no new meds.  She continues to have pain in left shoulder has arthritis in [facility] joint noted on xray- she needs referral to ortho. States she has taken up all of her pain meds which she filled approx 3 weeks ago.   She has had upper ext nerve conduction for complaint of paresthesia i n hands, which was neg, she is scheduled for Lower ext conduction as MRI did not show any reason for her persistent tingling or the severity of her pain.   Requested referral to pain clinic as well   When asked about above recurrent ER visits, she states at times her nerves get bad when she is pain and she starts to panic and goes to ER, despite our multiple conversation about overuse of ER for pain.  Review Of Systems:  GEN- denies fatigue, fever, weight loss,weakness, recent illness HEENT- denies eye drainage, change in vision, nasal discharge, CVS- denies chest pain, palpitations RESP- denies SOB, cough, wheeze ABD- denies N/V, change in stools, abd pain GU- denies dysuria, hematuria, dribbling, incontinence MSK- + joint pain, muscle aches, injury Neuro- denies  headache, dizziness, syncope, seizure activity       Objective:    BP 122/84 mmHg  Pulse 80  Temp(Src) 98.1 F (36.7 C) (Oral)  Resp 18  Wt 282 lb (127.914 kg)  LMP 01/26/2015 GEN- NAD, alert and oriented x3 HEENT- PERRL, EOMI, non injected sclera, pink conjunctiva, MMM, oropharynx clear Neck- Supple, FROM CVS- RRR, no murmur RESP-CTAB MSK- Decreased ROM LUE- TTP over AC, +empty can but pt seems out of proportion to the Range of Motion ?  Psych- normal affect and mood today , good eye contact EXT- No edema Pulses- Radial 2+        Assessment & Plan:      Problem List Items Addressed This Visit    GAD (generalized anxiety disorder)    im concerned she has some underlying anxiety or even hidden depressive symptoms contributing to her "pain" and somatic complaints. Will try her on Cymbalta 20mg  once a day        Facet arthropathy, lumbar    Known facet arthritis, and DDD, will refer to pain clinic  Script for [place] can not be filled until 02/13/15 Discussed with pt, if she is found going to ER again for pain meds she will be dismissed Also we have had some issues with her speaking in a degrading way to front office staff, if this also occurs again she will be  dismissed       Relevant Orders   Ambulatory referral to Pain Clinic   DDD (degenerative disc disease), lumbar   Relevant Medications   methylPREDNISolone (MEDROL DOSEPAK) 4 MG TBPK tablet   HYDROcodone-acetaminophen (NORCO/VICODIN) 5-325 MG tablet   Other Relevant Orders   Ambulatory referral to Pain Clinic    Other Visit Diagnoses    AC joint pain, left    -  Primary    Refer to ortho, given Medrol dose pak    Relevant Orders    Ambulatory referral to Orthopedic Surgery       Note: This dictation was prepared with Dragon dictation along with smaller phrase technology. Any transcriptional errors that result from this process are unintentional.

## 2015-02-07 NOTE — Patient Instructions (Addendum)
Steroid given for inflammation  Start the Cymbalta for your nerves  Use ICE  Referral to pain clinic  Referral to Orthopedics  F/U  4 months

## 2015-02-07 NOTE — Assessment & Plan Note (Signed)
Known facet arthritis, and DDD, will refer to pain clinic  Script for [place] can not be filled until 02/13/15 Discussed with pt, if she is found going to ER again for pain meds she will be dismissed Also we have had some issues with her speaking in a degrading way to front office staff, if this also occurs again she will be dismissed

## 2015-02-20 ENCOUNTER — Encounter: Payer: Self-pay | Admitting: *Deleted

## 2015-03-01 ENCOUNTER — Ambulatory Visit: Payer: Self-pay | Admitting: Orthopedic Surgery

## 2015-03-14 ENCOUNTER — Ambulatory Visit: Payer: Self-pay | Admitting: Orthopedic Surgery

## 2015-03-16 ENCOUNTER — Telehealth: Payer: Self-pay | Admitting: Family Medicine

## 2015-03-16 MED ORDER — HYDROCODONE-ACETAMINOPHEN 5-325 MG PO TABS: 1.0000 | ORAL_TABLET | Freq: Two times a day (BID) | ORAL | Status: DC | PRN

## 2015-03-16 NOTE — Telephone Encounter (Signed)
okay

## 2015-03-16 NOTE — Telephone Encounter (Signed)
Prescription printed and patient made aware to come to office to pick up after 2pm on 03/16/2015 per VM.

## 2015-03-16 NOTE — Telephone Encounter (Signed)
Pt is requesting a refill of Hydrocodone [phone]

## 2015-03-16 NOTE — Telephone Encounter (Signed)
Ok to refill??  Last office visit/ refill 02/07/2015.

## 2015-03-20 ENCOUNTER — Emergency Department (HOSPITAL_COMMUNITY): Payer: BLUE CROSS/BLUE SHIELD

## 2015-03-20 ENCOUNTER — Encounter (HOSPITAL_COMMUNITY): Payer: Self-pay | Admitting: Emergency Medicine

## 2015-03-20 ENCOUNTER — Emergency Department (HOSPITAL_COMMUNITY)
Admission: EM | Admit: 2015-03-20 | Discharge: 2015-03-20 | Disposition: A | Discharge status: Home / Self Care | Payer: BLUE CROSS/BLUE SHIELD | Attending: Emergency Medicine | Admitting: Emergency Medicine

## 2015-03-20 DIAGNOSIS — G43909 Migraine, unspecified, not intractable, without status migrainosus: Secondary | ICD-10-CM | POA: Diagnosis not present

## 2015-03-20 DIAGNOSIS — R112 Nausea with vomiting, unspecified: Secondary | ICD-10-CM | POA: Diagnosis not present

## 2015-03-20 DIAGNOSIS — Z79899 Other long term (current) drug therapy: Secondary | ICD-10-CM | POA: Diagnosis not present

## 2015-03-20 DIAGNOSIS — N189 Chronic kidney disease, unspecified: Secondary | ICD-10-CM | POA: Insufficient documentation

## 2015-03-20 DIAGNOSIS — G629 Polyneuropathy, unspecified: Secondary | ICD-10-CM | POA: Insufficient documentation

## 2015-03-20 DIAGNOSIS — R6889 Other general symptoms and signs: Secondary | ICD-10-CM

## 2015-03-20 DIAGNOSIS — J029 Acute pharyngitis, unspecified: Secondary | ICD-10-CM | POA: Insufficient documentation

## 2015-03-20 DIAGNOSIS — R05 Cough: Secondary | ICD-10-CM | POA: Diagnosis not present

## 2015-03-20 DIAGNOSIS — R52 Pain, unspecified: Secondary | ICD-10-CM | POA: Diagnosis present

## 2015-03-20 DIAGNOSIS — Z862 Personal history of diseases of the blood and blood-forming organs and certain disorders involving the immune mechanism: Secondary | ICD-10-CM | POA: Insufficient documentation

## 2015-03-20 DIAGNOSIS — M791 Myalgia: Secondary | ICD-10-CM | POA: Insufficient documentation

## 2015-03-20 DIAGNOSIS — Z3202 Encounter for pregnancy test, result negative: Secondary | ICD-10-CM | POA: Insufficient documentation

## 2015-03-20 DIAGNOSIS — R059 Cough, unspecified: Secondary | ICD-10-CM

## 2015-03-20 DIAGNOSIS — R630 Anorexia: Secondary | ICD-10-CM | POA: Diagnosis not present

## 2015-03-20 DIAGNOSIS — Z8541 Personal history of malignant neoplasm of cervix uteri: Secondary | ICD-10-CM | POA: Insufficient documentation

## 2015-03-20 LAB — URINALYSIS, ROUTINE W REFLEX MICROSCOPIC
BILIRUBIN URINE: NEGATIVE
Glucose, UA: NEGATIVE mg/dL
Hgb urine dipstick: NEGATIVE
KETONES UR: NEGATIVE mg/dL
NITRITE: POSITIVE — AB
PH: 6 (ref 5.0–8.0)
SPECIFIC GRAVITY, URINE: 1.025 (ref 1.005–1.030)

## 2015-03-20 LAB — CBC WITH DIFFERENTIAL/PLATELET
BASOS ABS: 0 10*3/uL (ref 0.0–0.1)
Basophils Relative: 0 %
Eosinophils Absolute: 0 10*3/uL (ref 0.0–0.7)
Eosinophils Relative: 1 %
HEMATOCRIT: 32.3 % — AB (ref 36.0–46.0)
Hemoglobin: 10.1 g/dL — ABNORMAL LOW (ref 12.0–15.0)
LYMPHS ABS: 0.3 10*3/uL — AB (ref 0.7–4.0)
LYMPHS PCT: 8 %
MCH: 24.3 pg — AB (ref 26.0–34.0)
MCHC: 31.3 g/dL (ref 30.0–36.0)
MCV: 77.8 fL — AB (ref 78.0–100.0)
MONO ABS: 0.7 10*3/uL (ref 0.1–1.0)
Monocytes Relative: 18 %
NEUTROS ABS: 3 10*3/uL (ref 1.7–7.7)
Neutrophils Relative %: 73 %
Platelets: 263 10*3/uL (ref 150–400)
RBC: 4.15 MIL/uL (ref 3.87–5.11)
RDW: 17.3 % — AB (ref 11.5–15.5)
WBC: 4.1 10*3/uL (ref 4.0–10.5)

## 2015-03-20 LAB — URINE MICROSCOPIC-ADD ON

## 2015-03-20 LAB — CK: CK TOTAL: 145 U/L (ref 38–234)

## 2015-03-20 LAB — BASIC METABOLIC PANEL
ANION GAP: 8 (ref 5–15)
BUN: 11 mg/dL (ref 6–20)
CO2: 26 mmol/L (ref 22–32)
Calcium: 8.4 mg/dL — ABNORMAL LOW (ref 8.9–10.3)
Chloride: 107 mmol/L (ref 101–111)
Creatinine, Ser: 1.2 mg/dL — ABNORMAL HIGH (ref 0.44–1.00)
GFR calc Af Amer: >60 mL/min (ref >60)
GFR calc non Af Amer: 53 mL/min — ABNORMAL LOW (ref >60)
GLUCOSE: 101 mg/dL — AB (ref 65–99)
POTASSIUM: 3.8 mmol/L (ref 3.5–5.1)
Sodium: 141 mmol/L (ref 135–145)

## 2015-03-20 LAB — POC URINE PREG, ED: Preg Test, Ur: NEGATIVE

## 2015-03-20 MED ORDER — SODIUM CHLORIDE 0.9 % IV BOLUS (SEPSIS)
1000.0000 mL | Freq: Once | INTRAVENOUS | Status: AC
  Administered 2015-03-20: 1000 mL via INTRAVENOUS

## 2015-03-20 MED ORDER — MORPHINE SULFATE (PF) 4 MG/ML IV SOLN
4.0000 mg | Freq: Once | INTRAVENOUS | Status: AC
  Administered 2015-03-20: 4 mg via INTRAVENOUS
  Filled 2015-03-20: qty 1

## 2015-03-20 MED ORDER — KETOROLAC TROMETHAMINE 30 MG/ML IJ SOLN: 30.0000 mg | Freq: Once | INTRAMUSCULAR | Status: DC

## 2015-03-20 MED ORDER — ACETAMINOPHEN 325 MG PO TABS
650.0000 mg | ORAL_TABLET | Freq: Once | ORAL | Status: AC
  Administered 2015-03-20: 650 mg via ORAL
  Filled 2015-03-20: qty 2

## 2015-03-20 NOTE — ED Notes (Signed)
Patient with c/o generalized body aches sore throat and headache since yesterday. C/o vomiting yesterday. None today.

## 2015-03-20 NOTE — ED Provider Notes (Signed)
CSN: [id number]     Arrival date & time 03/20/15  1105 History  By signing my name below, I, Kristi Martin, attest that this documentation has been prepared under the direction and in the presence of Kristi Fraise, MD. Electronically Signed: Stephania Martin, ED Scribe. 03/20/2015. 12:28 PM.    Chief Complaint  Patient presents with  . Generalized Body Aches   The history is provided by the patient. No language interpreter was used.    HPI Comments: Kristi Martin is a 45 y.o. female with a history of anemia, CKD, who presents to the Emergency Department complaining of gradual-onset, constant, moderate, worsening generalized myalgias that began yesterday. She also complains of associated vomiting, cough, urinary incontinence associated with cough and vomiting, fever, chills, loss of appetite, sore throat, bilateral otalgia, and headache. Patient denies any known sick contact or recent international travel. She denies a history of DM. She denies diarrhea, as she states she vomits everything she eats.    Past Medical History  Diagnosis Date  . Left knee pain   . Migraine   . Cancer ([place])     cervical cx at age 63  . Cervical cancer ([place])   . Chronic back pain   . Sciatica   . Anemia   . Chronic kidney disease   . Hypotension   . Vaginal Pap smear, abnormal   . DDD (degenerative disc disease), lumbar     Facet arthritis  . Peripheral neuropathy [facility])    Past Surgical History  Procedure Laterality Date  . Tubal ligation    . Cholecystectomy N/A 01/20/2013    Procedure: LAPAROSCOPIC CHOLECYSTECTOMY WITH INTRAOPERATIVE CHOLANGIOGRAM;  Surgeon: Shann Medal, MD;  Location: [facility];  Service: General;  Laterality: N/A;   Family History  Problem Relation Age of Onset  . Cancer Mother   . Colon cancer Mother   . Breast cancer Maternal Grandmother   . Heart disease Maternal Grandmother   . Breast cancer Paternal Grandmother   . Heart disease Paternal Grandmother   . Hypertension Paternal  Grandmother   . Alcohol abuse Father   . Drug abuse Father   . Asthma Brother   . Cancer Paternal Grandfather    Social History  Substance Use Topics  . Smoking status: Never Smoker   . Smokeless tobacco: Never Used  . Alcohol Use: 0.0 oz/week    0 Standard drinks or equivalent per week     Comment: occasionally   OB History    Gravida Para Term Preterm AB TAB SAB Ectopic Multiple Living   9 7 5 2 2  2   7      Review of Systems  Constitutional: Positive for fever, chills and appetite change (loss of appetite).  HENT: Positive for ear pain and sore throat.   Respiratory: Positive for cough.   Gastrointestinal: Positive for vomiting. Negative for diarrhea.  Musculoskeletal: Positive for myalgias (generalized).  Neurological: Positive for headaches.  All other systems reviewed and are negative.  Allergies  Benadryl and Onion  Home Medications   Prior to Admission medications   Medication Sig Start Date End Date Taking? Authorizing Provider  DULoxetine (CYMBALTA) 20 MG capsule Take 1 capsule (20 mg total) by mouth daily. For nerves 02/07/15   Alycia Rossetti, MD  gabapentin (NEURONTIN) 300 MG capsule Take 1 capsule twice a day and 2 at bedtime Patient taking differently: Take 300-600 mg by mouth 3 (three) times daily. Take 1 capsule in the morning, 1 capsule in the  afternoon, and 2 capsules at bedtime. 10/18/14   Alycia Rossetti, MD  HYDROcodone-acetaminophen (NORCO/VICODIN) 5-325 MG tablet Take 1-2 tablets by mouth 2 (two) times daily as needed. 03/16/15   Alycia Rossetti, MD  methocarbamol (ROBAXIN) 500 MG tablet Take 1 tablet (500 mg total) by mouth 2 (two) times daily as needed for muscle spasms. 10/18/14   Alycia Rossetti, MD  methylPREDNISolone (MEDROL DOSEPAK) 4 MG TBPK tablet Take as directed on package 02/07/15   Alycia Rossetti, MD  ondansetron (ZOFRAN ODT) 4 MG disintegrating tablet Take 1 tablet (4 mg total) by mouth every 8 (eight) hours as needed for nausea or  vomiting. 09/06/14   Alycia Rossetti, MD  pantoprazole (PROTONIX) 40 MG tablet Take 1 tablet (40 mg total) by mouth 2 (two) times daily. Patient taking differently: Take 40 mg by mouth 3 (three) times daily.  09/06/14   Alycia Rossetti, MD   BP 112/65 mmHg  Pulse 93  Temp(Src) 99.4 F (37.4 C) (Oral)  Resp 17  SpO2 94% Physical Exam  Nursing note and vitals reviewed. CONSTITUTIONAL: Well developed/well nourished, patient appears uncomfortable HEAD: Normocephalic/atraumatic EYES: EOMI/PERRL ENMT: Mucous membranes dry, bilateral TMs clear and intact NECK: supple no meningeal signs SPINE/BACK:entire spine nontender CV: S1/S2 noted, no murmurs/rubs/gallops noted LUNGS: Lungs are clear to auscultation bilaterally, no apparent distress ABDOMEN: soft, nontender, no rebound or guarding, bowel sounds noted throughout abdomen GU:no cva tenderness NEURO: Pt is awake/alert/appropriate, moves all extremitiesx4.  No facial droop.   EXTREMITIES: pulses normal/equal, full ROM SKIN: warm, color normal PSYCH: anxious   ED Course  Procedures   DIAGNOSTIC STUDIES: Oxygen Saturation is 97% on RA, normal by my interpretation.    COORDINATION OF CARE: 11:54 AM - Suspect influenza. Discussed treatment plan with pt at bedside which includes IV fluids and symptomatic medications. Will also perform CXR. Pt verbalized understanding and agreed to plan.  2:42 PM Pt improved Appears to have flu like illness She is in no distress Vitals improved On my assessment, RA pulse ox >95% No distress noted Advised rest We discussed strict return precautions  Labs Review Labs Reviewed  CBC WITH DIFFERENTIAL/PLATELET - Abnormal; Notable for the following:    Hemoglobin 10.1 (*)    HCT 32.3 (*)    MCV 77.8 (*)    MCH 24.3 (*)    RDW 17.3 (*)    Lymphs Abs 0.3 (*)    All other components within normal limits  BASIC METABOLIC PANEL - Abnormal; Notable for the following:    Glucose, Bld 101 (*)     Creatinine, Ser 1.20 (*)    Calcium 8.4 (*)    GFR calc non Af Amer 53 (*)    All other components within normal limits  URINALYSIS, ROUTINE W REFLEX MICROSCOPIC (NOT AT [facility]) - Abnormal; Notable for the following:    Protein, ur TRACE (*)    Nitrite POSITIVE (*)    Leukocytes, UA TRACE (*)    All other components within normal limits  URINE MICROSCOPIC-ADD ON - Abnormal; Notable for the following:    Squamous Epithelial / LPF 6-30 (*)    Bacteria, UA MANY (*)    All other components within normal limits  CK  POC URINE PREG, ED    Imaging Review Dg Chest 2 View  03/20/2015  CLINICAL DATA:  Generalized body aches, sore throat headache since yesterday, vomiting yesterday, remote history cervical cancer EXAM: CHEST  2 VIEW COMPARISON:  02/04/2015 FINDINGS: Normal heart size,  mediastinal contours and pulmonary vascularity. Peribronchial thickening with slightly accentuated perihilar markings likely reflecting bronchitis. No definite acute infiltrate, pleural effusion or pneumothorax. Bones unremarkable. Surgical clips RIGHT upper quadrant likely due to cholecystectomy. IMPRESSION: Bronchitic changes without infiltrate. Electronically Signed   By: Lavonia Dana M.D.   On: 03/20/2015 13:21   I have personally reviewed and evaluated these images and lab results as part of my medical decision-making.    MDM   Final diagnoses:  Flu-like symptoms  Cough  Non-intractable vomiting with nausea, vomiting of unspecified type    Nursing notes including past medical history and social history reviewed and considered in documentation xrays/imaging reviewed by myself and considered during evaluation Labs/vital reviewed myself and considered during evaluation   I personally performed the services described in this documentation, which was scribed in my presence. The recorded information has been reviewed and is accurate.       Kristi Fraise, MD 03/20/15 [phone]

## 2015-04-12 ENCOUNTER — Telehealth: Payer: Self-pay | Admitting: Family Medicine

## 2015-04-12 NOTE — Telephone Encounter (Signed)
Patient needs rx for hydrocodone  [phone]

## 2015-04-12 NOTE — Telephone Encounter (Signed)
?   Ok to refill  Last rf: 03/16/15  Last ov:02/07/15

## 2015-04-13 MED ORDER — HYDROCODONE-ACETAMINOPHEN 5-325 MG PO TABS: 1.0000 | ORAL_TABLET | Freq: Two times a day (BID) | ORAL | Status: DC | PRN

## 2015-04-13 NOTE — Telephone Encounter (Signed)
Okay to refill? 

## 2015-04-13 NOTE — Telephone Encounter (Signed)
Script printed,ready for provider signature 

## 2015-04-30 ENCOUNTER — Other Ambulatory Visit: Payer: Self-pay | Admitting: Family Medicine

## 2015-04-30 ENCOUNTER — Encounter: Payer: Self-pay | Admitting: Family Medicine

## 2015-04-30 ENCOUNTER — Ambulatory Visit (INDEPENDENT_AMBULATORY_CARE_PROVIDER_SITE_OTHER): Payer: Self-pay | Admitting: Family Medicine

## 2015-04-30 ENCOUNTER — Telehealth: Payer: Self-pay | Admitting: *Deleted

## 2015-04-30 VITALS — BP 130/78 | HR 80 | Temp 97.5°F | Resp 16 | Ht 66.0 in | Wt 273.0 lb

## 2015-04-30 DIAGNOSIS — N183 Chronic kidney disease, stage 3 unspecified: Secondary | ICD-10-CM

## 2015-04-30 DIAGNOSIS — R7302 Impaired glucose tolerance (oral): Secondary | ICD-10-CM

## 2015-04-30 DIAGNOSIS — R0602 Shortness of breath: Secondary | ICD-10-CM

## 2015-04-30 LAB — CBC WITH DIFFERENTIAL/PLATELET
BASOS ABS: 0.1 10*3/uL (ref 0.0–0.1)
Basophils Relative: 1 % (ref 0–1)
EOS PCT: 1 % (ref 0–5)
Eosinophils Absolute: 0.1 10*3/uL (ref 0.0–0.7)
HEMATOCRIT: 33.4 % — AB (ref 36.0–46.0)
Hemoglobin: 10.3 g/dL — ABNORMAL LOW (ref 12.0–15.0)
LYMPHS ABS: 1.9 10*3/uL (ref 0.7–4.0)
LYMPHS PCT: 32 % (ref 12–46)
MCH: 24.1 pg — AB (ref 26.0–34.0)
MCHC: 30.8 g/dL (ref 30.0–36.0)
MCV: 78 fL (ref 78.0–100.0)
MONO ABS: 0.5 10*3/uL (ref 0.1–1.0)
MONOS PCT: 9 % (ref 3–12)
MPV: 11.5 fL (ref 8.6–12.4)
Neutro Abs: 3.4 10*3/uL (ref 1.7–7.7)
Neutrophils Relative %: 57 % (ref 43–77)
Platelets: 319 10*3/uL (ref 150–400)
RBC: 4.28 MIL/uL (ref 3.87–5.11)
RDW: 17.4 % — AB (ref 11.5–15.5)
WBC: 5.9 10*3/uL (ref 4.0–10.5)

## 2015-04-30 LAB — COMPREHENSIVE METABOLIC PANEL
ALK PHOS: 63 U/L (ref 33–115)
ALT: 8 U/L (ref 6–29)
AST: 13 U/L (ref 10–35)
Albumin: 3.7 g/dL (ref 3.6–5.1)
BUN: 12 mg/dL (ref 7–25)
CALCIUM: 8.4 mg/dL — AB (ref 8.6–10.2)
CO2: 24 mmol/L (ref 20–31)
Chloride: 106 mmol/L (ref 98–110)
Creat: 1.1 mg/dL (ref 0.50–1.10)
GLUCOSE: 88 mg/dL (ref 70–99)
POTASSIUM: 4.3 mmol/L (ref 3.5–5.3)
Sodium: 140 mmol/L (ref 135–146)
TOTAL PROTEIN: 6.5 g/dL (ref 6.1–8.1)
Total Bilirubin: 0.3 mg/dL (ref 0.2–1.2)

## 2015-04-30 LAB — HEMOGLOBIN A1C
HEMOGLOBIN A1C: 6.1 % — AB (ref <5.7)
Mean Plasma Glucose: 128 mg/dL

## 2015-04-30 MED ORDER — ALBUTEROL SULFATE HFA 108 (90 BASE) MCG/ACT IN AERS: 2.0000 | INHALATION_SPRAY | RESPIRATORY_TRACT | Status: DC | PRN

## 2015-04-30 NOTE — Telephone Encounter (Signed)
Received call from patient.   Reports that she went to pickup albuterol inhaler and was advised that rescue inhaler will cost $60.00 out of pocket.   Patient will not be able to pick up until Friday. Requested sample. No samples of albuterol in stock.   MD please advise.

## 2015-04-30 NOTE — Telephone Encounter (Signed)
Call placed to patient and patient made aware.  

## 2015-04-30 NOTE — Progress Notes (Signed)
Patient ID: Kristi Martin, female   DOB: [birth year], 45 y.o.   MRN: [id number]   Subjective:    Patient ID: Kristi Martin, female    DOB: [birth year], 45 y.o.   MRN: [id number]  Patient presents for SOB Patient here with 2 episodes of shortness of breath. She states that she woke up about 3:00 this morning from her sleep she cannot catch her breath she didn't vomited. She was able to go back to sleep years she does not have any chest pain during the episode. She has not been ill recently. She Went to Work around 6:00 This Morning She Had Another Episode While She Was up Working Where She Felt like She Couldn't Catch Her Breath and Then She Threw up Again. At That Time She Still Did Not Have Any Chest Pain or Diaphoresis No Residual Shortness of Breath after the Episode. She States That This Last Occurred about a Year Ago but She Was Not Sure What Was Going on at That Time. She Does Have Remote History of asthma but has not had any asthma exacerbations. She was treated for bronchitis and flulike illness about a month ago. She states that she has not been sick recently she has not had any cough no wheezing she denies any increased anxiety or stress at home. She states that she is doing very well. She does state there is some chemicals at work and sometimes trigger her breathing and make her dizzy at time    Review Of Systems:  GEN- denies fatigue, fever, weight loss,weakness, recent illness HEENT- denies eye drainage, change in vision, nasal discharge, CVS- denies chest pain, palpitations RESP- + SOB, denies cough, wheeze ABD- denies N/V, change in stools, abd pain GU- denies dysuria, hematuria, dribbling, incontinence MSK- denies joint pain, muscle aches, injury Neuro- denies headache, dizziness, syncope, seizure activity       Objective:    BP 130/78 mmHg  Pulse 80  Temp(Src) 97.5 F (36.4 C) (Oral)  Resp 16  Ht 5\' 6"  (1.676 m)  Wt 273 lb (123.832 kg)  BMI 44.08 kg/m2  SpO2  98% GEN- NAD, alert and oriented x3,well appearing  HEENT- PERRL, EOMI, non injected sclera, pink conjunctiva, MMM, oropharynx clear Neck- Supple, no LAD  CVS- RRR, no murmur RESP-CTAB ABD-NABS,soft,NT,ND EXT- No edema Pulses- Radial,  2+        Assessment & Plan:      Problem List Items Addressed This Visit    Glucose intolerance (impaired glucose tolerance)   Relevant Orders   Hemoglobin A1c   CKD (chronic kidney disease) stage 3, GFR 30-59 ml/min    I'll recheck her renal function as well as her A1c to make sure this is not contributing to her random symptoms. She was recently in the emergency room a month ago and had some renal insufficiency. She also has chronic anemia.      Relevant Orders   CBC with Differential/Platelet   Comprehensive metabolic panel    Other Visit Diagnoses    SOB (shortness of breath)    -  Primary    Very odd symptoms. She declines having panic attacks. She does have remote history of asthma. Her EKG is normal. She looks very well today. I will try her on albuterol prn, advised to wear mask at home No current illness, Advise if this reoccurs to go to ER Note she in on PPI for GERD already    Relevant Orders    EKG 12-Lead (Completed)  CBC with Differential/Platelet       Note: This dictation was prepared with Dragon dictation along with smaller phrase technology. Any transcriptional errors that result from this process are unintentional.

## 2015-04-30 NOTE — Patient Instructions (Signed)
Give work note for today Can return tomorrow Your EKG looks good We will call with labs Try the albuterol inhaler if you have another spell- keep with you Keep appt, if you have another spell go to ER F/U as previous

## 2015-04-30 NOTE — Telephone Encounter (Signed)
Not sure why this says out of pocket she has insurance.  We do not have samples of this otherwise. Just tell her if she has another episode go to ER, we dont have anything else here that we can give her

## 2015-04-30 NOTE — Assessment & Plan Note (Signed)
I'll recheck her renal function as well as her A1c to make sure this is not contributing to her random symptoms. She was recently in the emergency room a month ago and had some renal insufficiency. She also has chronic anemia.

## 2015-05-01 ENCOUNTER — Encounter (HOSPITAL_COMMUNITY): Payer: Self-pay | Admitting: Emergency Medicine

## 2015-05-01 ENCOUNTER — Emergency Department (HOSPITAL_COMMUNITY)
Admission: EM | Admit: 2015-05-01 | Discharge: 2015-05-01 | Disposition: A | Discharge status: Home / Self Care | Payer: Self-pay | Attending: Emergency Medicine | Admitting: Emergency Medicine

## 2015-05-01 ENCOUNTER — Emergency Department (HOSPITAL_COMMUNITY): Payer: Self-pay

## 2015-05-01 ENCOUNTER — Telehealth: Payer: Self-pay | Admitting: Family Medicine

## 2015-05-01 DIAGNOSIS — Z79899 Other long term (current) drug therapy: Secondary | ICD-10-CM | POA: Insufficient documentation

## 2015-05-01 DIAGNOSIS — K2100 Gastro-esophageal reflux disease with esophagitis, without bleeding: Secondary | ICD-10-CM

## 2015-05-01 DIAGNOSIS — Z8541 Personal history of malignant neoplasm of cervix uteri: Secondary | ICD-10-CM | POA: Insufficient documentation

## 2015-05-01 DIAGNOSIS — N189 Chronic kidney disease, unspecified: Secondary | ICD-10-CM | POA: Insufficient documentation

## 2015-05-01 MED ORDER — SUCRALFATE 1 G PO TABS: 1.0000 g | ORAL_TABLET | Freq: Three times a day (TID) | ORAL | Status: DC

## 2015-05-01 MED ORDER — IOHEXOL 300 MG/ML  SOLN
75.0000 mL | Freq: Once | INTRAMUSCULAR | Status: AC | PRN
  Administered 2015-05-01: 75 mL via INTRAVENOUS

## 2015-05-01 MED ORDER — SUCRALFATE 1 G PO TABS
1.0000 g | ORAL_TABLET | Freq: Three times a day (TID) | ORAL | Status: DC
  Administered 2015-05-01: 1 g via ORAL
  Filled 2015-05-01: qty 1

## 2015-05-01 NOTE — ED Provider Notes (Signed)
CSN: [id number]     Arrival date & time 05/01/15  1725 History   First MD Initiated Contact with Patient 05/01/15 2041     Chief Complaint  Patient presents with  . Shortness of Breath     (Consider location/radiation/quality/duration/timing/severity/associated sxs/prior Treatment) Patient is a 45 y.o. female presenting with shortness of breath. The history is provided by the patient (Patient states that she has had 2 episodes in the last couple days where she is awakened at night short of breath. She has also had anxiety attack during the day that she saw her family doctor for).  Shortness of Breath Severity:  Moderate Onset quality:  Sudden Timing:  Rare Progression:  Resolved Chronicity:  New Context: not activity   Associated symptoms: no abdominal pain, no chest pain, no cough, no headaches and no rash     Past Medical History  Diagnosis Date  . Left knee pain   . Migraine   . Cancer ([place])     cervical cx at age 40  . Cervical cancer ([place])   . Chronic back pain   . Sciatica   . Anemia   . Chronic kidney disease   . Hypotension   . Vaginal Pap smear, abnormal   . DDD (degenerative disc disease), lumbar     Facet arthritis  . Peripheral neuropathy [facility])    Past Surgical History  Procedure Laterality Date  . Tubal ligation    . Cholecystectomy N/A 01/20/2013    Procedure: LAPAROSCOPIC CHOLECYSTECTOMY WITH INTRAOPERATIVE CHOLANGIOGRAM;  Surgeon: Shann Medal, MD;  Location: [facility];  Service: General;  Laterality: N/A;   Family History  Problem Relation Age of Onset  . Cancer Mother   . Colon cancer Mother   . Breast cancer Maternal Grandmother   . Heart disease Maternal Grandmother   . Breast cancer Paternal Grandmother   . Heart disease Paternal Grandmother   . Hypertension Paternal Grandmother   . Alcohol abuse Father   . Drug abuse Father   . Asthma Brother   . Cancer Paternal Grandfather    Social History  Substance Use Topics  . Smoking status:  Never Smoker   . Smokeless tobacco: Never Used  . Alcohol Use: 0.0 oz/week    0 Standard drinks or equivalent per week     Comment: occasionally   OB History    Gravida Para Term Preterm AB TAB SAB Ectopic Multiple Living   9 7 5 2 2  2   7      Review of Systems  Constitutional: Negative for appetite change and fatigue.  HENT: Negative for congestion, ear discharge and sinus pressure.   Eyes: Negative for discharge.  Respiratory: Positive for shortness of breath. Negative for cough.   Cardiovascular: Negative for chest pain.  Gastrointestinal: Negative for abdominal pain and diarrhea.  Genitourinary: Negative for frequency and hematuria.  Musculoskeletal: Negative for back pain.  Skin: Negative for rash.  Neurological: Negative for seizures and headaches.  Psychiatric/Behavioral: Negative for hallucinations.      Allergies  Benadryl and Onion  Home Medications   Prior to Admission medications   Medication Sig Start Date End Date Taking? Authorizing Provider  albuterol (PROVENTIL HFA;VENTOLIN HFA) 108 (90 Base) MCG/ACT inhaler Inhale 2 puffs into the lungs every 4 (four) hours as needed for wheezing or shortness of breath. 04/30/15   Alycia Rossetti, MD  DULoxetine (CYMBALTA) 20 MG capsule Take 1 capsule (20 mg total) by mouth daily. For nerves 02/07/15   Lonell Grandchild  Donato Schultz, MD  gabapentin (NEURONTIN) 300 MG capsule Take 1 capsule twice a day and 2 at bedtime Patient taking differently: Take 300-600 mg by mouth 3 (three) times daily. Take 1 capsule in the morning, 1 capsule in the afternoon, and 2 capsules at bedtime. 10/18/14   Alycia Rossetti, MD  HYDROcodone-acetaminophen (NORCO/VICODIN) 5-325 MG tablet Take 1-2 tablets by mouth 2 (two) times daily as needed. 04/13/15   Alycia Rossetti, MD  methocarbamol (ROBAXIN) 500 MG tablet Take 1 tablet (500 mg total) by mouth 2 (two) times daily as needed for muscle spasms. 10/18/14   Alycia Rossetti, MD  ondansetron (ZOFRAN ODT) 4 MG  disintegrating tablet Take 1 tablet (4 mg total) by mouth every 8 (eight) hours as needed for nausea or vomiting. 09/06/14   Alycia Rossetti, MD  pantoprazole (PROTONIX) 40 MG tablet Take 1 tablet (40 mg total) by mouth 2 (two) times daily. Patient taking differently: Take 40 mg by mouth 3 (three) times daily.  09/06/14   Alycia Rossetti, MD   BP 134/81 mmHg  Pulse 98  Temp(Src) 98 F (36.7 C) (Oral)  Resp 16  Ht 5\' 5"  (1.651 m)  Wt 273 lb (123.832 kg)  BMI 45.43 kg/m2  SpO2 97%  LMP 03/22/2015 Physical Exam  Constitutional: She is oriented to person, place, and time. She appears well-developed.  HENT:  Head: Normocephalic.  Eyes: Conjunctivae and EOM are normal. No scleral icterus.  Neck: Neck supple. No thyromegaly present.  Cardiovascular: Normal rate and regular rhythm.  Exam reveals no gallop and no friction rub.   No murmur heard. Pulmonary/Chest: No stridor. She has no wheezes. She has no rales. She exhibits no tenderness.  Abdominal: She exhibits no distension. There is no tenderness. There is no rebound.  Musculoskeletal: Normal range of motion. She exhibits no edema.  Lymphadenopathy:    She has no cervical adenopathy.  Neurological: She is oriented to person, place, and time. She exhibits normal muscle tone. Coordination normal.  Skin: No rash noted. No erythema.  Psychiatric: She has a normal mood and affect. Her behavior is normal.    ED Course  Procedures (including critical care time) Labs Review Labs Reviewed - No data to display  Imaging Review Dg Chest 2 View  05/01/2015  CLINICAL DATA:  Intermittent episodes of severe shortness of breath since yesterday. EXAM: CHEST  2 VIEW COMPARISON:  03/20/2015. FINDINGS: The cardiac silhouette, mediastinal and hilar contours are within normal limits and stable. The lungs are clear. No pleural effusion. The bony thorax is intact. IMPRESSION: No acute cardiopulmonary findings. No change since prior chest x-rays.  Electronically Signed   By: Marijo Sanes M.D.   On: 05/01/2015 17:51   I have personally reviewed and evaluated these images and lab results as part of my medical decision-making.   EKG Interpretation   Date/Time:  Tuesday May 01 2015 17:34:10 EDT Ventricular Rate:  84 PR Interval:  158 QRS Duration: 66 QT Interval:  366 QTC Calculation: 432 R Axis:   33 Text Interpretation:  Normal sinus rhythm Normal ECG No significant change  was found Confirmed by Wyvonnia Dusky  MD, STEPHEN [phone]) on 05/01/2015 8:40:58  PM      MDM   Final diagnoses:  None    Patient had chemistries and liver studies done at the doctor's office that were unremarkable. She also had a CBC that showed hemoglobin be 10.   Chest x-ray was unremarkable. CT scan the neck was unremarkable. Suspect  symptoms is related to GERD. Will add Carafate to patient's protonic and have her follow-up with her doctor next week    Milton Ferguson, MD 05/01/15 2222

## 2015-05-01 NOTE — Telephone Encounter (Signed)
Told to call if had another episode of difficulty breathing night.  Had one last night.  Only difference this time is she did not throw up.

## 2015-05-01 NOTE — Telephone Encounter (Signed)
Noted, advised if she had another to go to ER if this was reoccurring She stated she could not afford her inhaler

## 2015-05-01 NOTE — ED Notes (Signed)
Pt denies cp

## 2015-05-01 NOTE — Telephone Encounter (Signed)
LMTCB

## 2015-05-01 NOTE — ED Notes (Signed)
Patient given discharge instruction, verbalized understand. IV removed, band aid applied. Patient ambulatory out of the department.  

## 2015-05-01 NOTE — ED Notes (Addendum)
Pt reports past couple of days having sob, not able to catch her breath.  Pt has no hx of this.  Pt alert and oriented. Lung sounds clear.

## 2015-05-01 NOTE — Discharge Instructions (Signed)
Follow up with your md in a week °

## 2015-05-01 NOTE — Telephone Encounter (Signed)
Pt made aware of provider recommendations 

## 2015-05-01 NOTE — ED Notes (Signed)
Physician in room with pt.

## 2015-05-03 LAB — VITAMIN D 25 HYDROXY (VIT D DEFICIENCY, FRACTURES): Vit D, 25-Hydroxy: 12 ng/mL — ABNORMAL LOW (ref 30–100)

## 2015-05-09 ENCOUNTER — Other Ambulatory Visit: Payer: Self-pay | Admitting: *Deleted

## 2015-05-09 MED ORDER — VITAMIN D (ERGOCALCIFEROL) 1.25 MG (50000 UNIT) PO CAPS: 50000.0000 [IU] | ORAL_CAPSULE | ORAL | Status: DC

## 2015-05-11 ENCOUNTER — Other Ambulatory Visit: Payer: Self-pay | Admitting: Family Medicine

## 2015-05-11 MED ORDER — HYDROCODONE-ACETAMINOPHEN 5-325 MG PO TABS: 1.0000 | ORAL_TABLET | Freq: Two times a day (BID) | ORAL | Status: DC | PRN

## 2015-05-11 NOTE — Telephone Encounter (Signed)
Pt is requesting a refill of Hydrocodone.  [phone]

## 2015-05-11 NOTE — Telephone Encounter (Signed)
Okay to refll 

## 2015-05-11 NOTE — Telephone Encounter (Signed)
LRF 04/13/15 #100   LOV 04/30/15  OK refill?

## 2015-05-11 NOTE — Telephone Encounter (Signed)
Script printed ready for provider signature 

## 2015-06-08 ENCOUNTER — Ambulatory Visit (INDEPENDENT_AMBULATORY_CARE_PROVIDER_SITE_OTHER): Payer: No Typology Code available for payment source | Admitting: Family Medicine

## 2015-06-08 ENCOUNTER — Encounter: Payer: Self-pay | Admitting: Family Medicine

## 2015-06-08 VITALS — BP 138/72 | HR 78 | Temp 97.8°F | Resp 14 | Ht 65.0 in | Wt 269.0 lb

## 2015-06-08 DIAGNOSIS — F411 Generalized anxiety disorder: Secondary | ICD-10-CM

## 2015-06-08 DIAGNOSIS — F4321 Adjustment disorder with depressed mood: Secondary | ICD-10-CM

## 2015-06-08 DIAGNOSIS — F329 Major depressive disorder, single episode, unspecified: Secondary | ICD-10-CM | POA: Insufficient documentation

## 2015-06-08 DIAGNOSIS — K219 Gastro-esophageal reflux disease without esophagitis: Secondary | ICD-10-CM

## 2015-06-08 DIAGNOSIS — G8929 Other chronic pain: Secondary | ICD-10-CM

## 2015-06-08 DIAGNOSIS — M549 Dorsalgia, unspecified: Secondary | ICD-10-CM

## 2015-06-08 MED ORDER — FLUOXETINE HCL 20 MG PO TABS: 20.0000 mg | ORAL_TABLET | Freq: Every day | ORAL | Status: DC

## 2015-06-08 MED ORDER — PANTOPRAZOLE SODIUM 40 MG PO TBEC: 40.0000 mg | DELAYED_RELEASE_TABLET | Freq: Two times a day (BID) | ORAL | Status: DC

## 2015-06-08 MED ORDER — GABAPENTIN 300 MG PO CAPS: ORAL_CAPSULE | ORAL | Status: DC

## 2015-06-08 MED ORDER — HYDROCODONE-ACETAMINOPHEN 5-325 MG PO TABS: 1.0000 | ORAL_TABLET | Freq: Two times a day (BID) | ORAL | Status: DC | PRN

## 2015-06-08 NOTE — Assessment & Plan Note (Signed)
Chronic back pain with degenerative changes and arthritis. I will continue her on the hydrocodone as well as gabapentin she is able to afford these medications out of pocket for now.

## 2015-06-08 NOTE — Progress Notes (Signed)
Patient ID: Kristi Martin, female   DOB: [birth year], 45 y.o.   MRN: [id number]    Subjective:    Patient ID: Kristi Martin, female    DOB: 1970-06-27, 45 y.o.   MRN: [id number]  Patient presents for 4 month F/U; Vertigo; and Emotional Distress Patient here for follow-up. Fourthly she is not working any longer. She's been under a lot of stress. She states that she stays at home with the day by herself as her boyfriend and works third shift then when he comes home they just sleep during the day. She is also stressed by her boyfriend's mother he comes over daily. She wishes that she could work again she was doing much better physically when she was working. She felt herself getting very anxious at times other times she is depressed and starts crying for no reason. She initially thought it was her hormones. She has not had any further shortness of breath or chest pain since she has been on the Protonix and the Carafate. Unfortunately she is pain out of pocket for her medications that she lost her insurance last week. She also had another dizzy spell but that resolved.    Review Of Systems:  GEN- denies fatigue, fever, weight loss,weakness, recent illness HEENT- denies eye drainage, change in vision, nasal discharge, CVS- denies chest pain, palpitations RESP- denies SOB, cough, wheeze ABD- denies N/V, change in stools, abd pain GU- denies dysuria, hematuria, dribbling, incontinence MSK- + joint pain, muscle aches, injury Neuro- denies headache, dizziness, syncope, seizure activity       Objective:    BP 138/72 mmHg  Pulse 78  Temp(Src) 97.8 F (36.6 C) (Oral)  Resp 14  Ht 5\' 5"  (1.651 m)  Wt 269 lb (122.018 kg)  BMI 44.76 kg/m2  LMP 05/11/2015 GEN- NAD, alert and oriented x3 HEENT- PERRL, EOMI, non injected sclera, pink conjunctiva, MMM, oropharynx clear CVS- RRR, no murmur RESP-CTAB Psych- a little anxious appearing,not depressed, no SI, normal speech  Neuro-CNII-XII intact no  deficits EXT- No edema Pulses- Radial  2+        Assessment & Plan:      Problem List Items Addressed This Visit    GERD (gastroesophageal reflux disease)    She will check on cost of pantoprazole. For now given her Nexium 20 mg samples which she can take one twice a day to see if this helps if she is unable to afford the pantoprazole. She can then buy this over-the-counter She does need GI evaluation but unable to afford at this time      Relevant Medications   pantoprazole (PROTONIX) 40 MG tablet   GAD (generalized anxiety disorder)    Generalized anxiety disorder also with some depressed mood. She cannot afford the Cymbalta. Abdomen is change her to fluoxetine 20 mg      Chronic back pain - Primary    Chronic back pain with degenerative changes and arthritis. I will continue her on the hydrocodone as well as gabapentin she is able to afford these medications out of pocket for now.      Relevant Medications   HYDROcodone-acetaminophen (NORCO/VICODIN) 5-325 MG tablet      Note: This dictation was prepared with Dragon dictation along with smaller phrase technology. Any transcriptional errors that result from this process are unintentional.

## 2015-06-08 NOTE — Patient Instructions (Signed)
Take generic Prozac instead of the Cymbalta Continue all other medication Try the nexium take 1 tablet twice a day  F/U 4 months

## 2015-06-08 NOTE — Assessment & Plan Note (Addendum)
She will check on cost of pantoprazole. For now given her Nexium 20 mg samples which she can take one twice a day to see if this helps if she is unable to afford the pantoprazole. She can then buy this over-the-counter She does need GI evaluation but unable to afford at this time

## 2015-06-08 NOTE — Assessment & Plan Note (Signed)
Generalized anxiety disorder also with some depressed mood. She cannot afford the Cymbalta. Abdomen is change her to fluoxetine 20 mg

## 2015-07-05 ENCOUNTER — Emergency Department (HOSPITAL_COMMUNITY): Payer: Self-pay

## 2015-07-05 ENCOUNTER — Encounter (HOSPITAL_COMMUNITY): Payer: Self-pay

## 2015-07-05 ENCOUNTER — Emergency Department (HOSPITAL_COMMUNITY)
Admission: EM | Admit: 2015-07-05 | Discharge: 2015-07-05 | Disposition: A | Discharge status: Home / Self Care | Payer: Self-pay | Attending: Emergency Medicine | Admitting: Emergency Medicine

## 2015-07-05 DIAGNOSIS — Z8541 Personal history of malignant neoplasm of cervix uteri: Secondary | ICD-10-CM | POA: Insufficient documentation

## 2015-07-05 DIAGNOSIS — Z79891 Long term (current) use of opiate analgesic: Secondary | ICD-10-CM | POA: Insufficient documentation

## 2015-07-05 DIAGNOSIS — Z79899 Other long term (current) drug therapy: Secondary | ICD-10-CM | POA: Insufficient documentation

## 2015-07-05 DIAGNOSIS — R1032 Left lower quadrant pain: Secondary | ICD-10-CM

## 2015-07-05 DIAGNOSIS — N189 Chronic kidney disease, unspecified: Secondary | ICD-10-CM | POA: Insufficient documentation

## 2015-07-05 DIAGNOSIS — N390 Urinary tract infection, site not specified: Secondary | ICD-10-CM | POA: Insufficient documentation

## 2015-07-05 DIAGNOSIS — R911 Solitary pulmonary nodule: Secondary | ICD-10-CM | POA: Insufficient documentation

## 2015-07-05 LAB — CBC WITH DIFFERENTIAL/PLATELET
BASOS ABS: 0 10*3/uL (ref 0.0–0.1)
Basophils Relative: 1 %
EOS PCT: 1 %
Eosinophils Absolute: 0.1 10*3/uL (ref 0.0–0.7)
HCT: 32 % — ABNORMAL LOW (ref 36.0–46.0)
HEMOGLOBIN: 9.9 g/dL — AB (ref 12.0–15.0)
LYMPHS PCT: 30 %
Lymphs Abs: 1.8 10*3/uL (ref 0.7–4.0)
MCH: 23.9 pg — ABNORMAL LOW (ref 26.0–34.0)
MCHC: 30.9 g/dL (ref 30.0–36.0)
MCV: 77.3 fL — AB (ref 78.0–100.0)
Monocytes Absolute: 0.6 10*3/uL (ref 0.1–1.0)
Monocytes Relative: 11 %
NEUTROS ABS: 3.5 10*3/uL (ref 1.7–7.7)
NEUTROS PCT: 57 %
PLATELETS: 299 10*3/uL (ref 150–400)
RBC: 4.14 MIL/uL (ref 3.87–5.11)
RDW: 17.9 % — ABNORMAL HIGH (ref 11.5–15.5)
WBC: 6 10*3/uL (ref 4.0–10.5)

## 2015-07-05 LAB — URINALYSIS, ROUTINE W REFLEX MICROSCOPIC
Bilirubin Urine: NEGATIVE
GLUCOSE, UA: NEGATIVE mg/dL
Ketones, ur: NEGATIVE mg/dL
Nitrite: POSITIVE — AB
PH: 5.5 (ref 5.0–8.0)
Protein, ur: NEGATIVE mg/dL
SPECIFIC GRAVITY, URINE: 1.025 (ref 1.005–1.030)

## 2015-07-05 LAB — BASIC METABOLIC PANEL
ANION GAP: 5 (ref 5–15)
BUN: 11 mg/dL (ref 6–20)
CO2: 26 mmol/L (ref 22–32)
Calcium: 8.8 mg/dL — ABNORMAL LOW (ref 8.9–10.3)
Chloride: 107 mmol/L (ref 101–111)
Creatinine, Ser: 1.1 mg/dL — ABNORMAL HIGH (ref 0.44–1.00)
GFR calc Af Amer: >60 mL/min (ref >60)
GFR, EST NON AFRICAN AMERICAN: 59 mL/min — AB (ref >60)
GLUCOSE: 96 mg/dL (ref 65–99)
POTASSIUM: 3.8 mmol/L (ref 3.5–5.1)
Sodium: 138 mmol/L (ref 135–145)

## 2015-07-05 LAB — URINE MICROSCOPIC-ADD ON

## 2015-07-05 LAB — PREGNANCY, URINE: Preg Test, Ur: NEGATIVE

## 2015-07-05 MED ORDER — IOPAMIDOL (ISOVUE-300) INJECTION 61%
100.0000 mL | Freq: Once | INTRAVENOUS | Status: AC | PRN
  Administered 2015-07-05: 100 mL via INTRAVENOUS

## 2015-07-05 MED ORDER — MORPHINE SULFATE (PF) 4 MG/ML IV SOLN
4.0000 mg | Freq: Once | INTRAVENOUS | Status: AC
  Administered 2015-07-05: 4 mg via INTRAVENOUS
  Filled 2015-07-05: qty 1

## 2015-07-05 MED ORDER — CEFTRIAXONE SODIUM 1 G IJ SOLR
1.0000 g | Freq: Once | INTRAMUSCULAR | Status: AC
  Administered 2015-07-05: 1 g via INTRAVENOUS
  Filled 2015-07-05: qty 10

## 2015-07-05 MED ORDER — HYDROMORPHONE HCL 1 MG/ML IJ SOLN
1.0000 mg | Freq: Once | INTRAMUSCULAR | Status: AC
  Administered 2015-07-05: 1 mg via INTRAVENOUS
  Filled 2015-07-05: qty 1

## 2015-07-05 MED ORDER — NAPROXEN 500 MG PO TABS: 500.0000 mg | ORAL_TABLET | Freq: Two times a day (BID) | ORAL | Status: DC

## 2015-07-05 MED ORDER — CEPHALEXIN 500 MG PO CAPS: 500.0000 mg | ORAL_CAPSULE | Freq: Four times a day (QID) | ORAL | Status: DC

## 2015-07-05 MED ORDER — DIATRIZOATE MEGLUMINE & SODIUM 66-10 % PO SOLN
15.0000 mL | Freq: Once | ORAL | Status: AC
  Administered 2015-07-05: 15 mL via ORAL

## 2015-07-05 NOTE — ED Notes (Signed)
Pt reports went to urinate around 8:30 last night and when she sat down she had sudden onset of llq pain that radiated into pelvis.  Pt says has not voided since then.

## 2015-07-05 NOTE — ED Provider Notes (Signed)
CSN: [id number]     Arrival date & time 07/05/15  [id number] History  By signing my name below, I, Nicole Kindred, attest that this documentation has been prepared under the direction and in the presence of Noemi Chapel, MD.   Electronically Signed: Nicole Kindred, ED Scribe. 07/05/2015. 1:26 PM  Chief Complaint  Patient presents with  . Urinary Retention    The history is provided by the patient. No language interpreter was used.   HPI Comments: ROSILAND YOUNGSTROM is a 45 y.o. female with PSHx of cholecystectomy who presents to the Emergency Department complaining of gradual onset, lower abdominal pain, ongoing since this morning. Pt reports left sided abdominal pain while urinating beginning yesterday afternoon. She also notes associated urinary retention beginning at 8:30 PM last night. No other associated symptoms noted. No worsening or alleviating factors noted. Pt denies hx of kidney stones, fever, chills, nausea, or any other pertinent symptoms.  Past Medical History  Diagnosis Date  . Left knee pain   . Migraine   . Cancer ([place])     cervical cx at age 54  . Cervical cancer ([place])   . Chronic back pain   . Sciatica   . Anemia   . Chronic kidney disease   . Hypotension   . Vaginal Pap smear, abnormal   . DDD (degenerative disc disease), lumbar     Facet arthritis  . Peripheral neuropathy [facility])    Past Surgical History  Procedure Laterality Date  . Tubal ligation    . Cholecystectomy N/A 01/20/2013    Procedure: LAPAROSCOPIC CHOLECYSTECTOMY WITH INTRAOPERATIVE CHOLANGIOGRAM;  Surgeon: Shann Medal, MD;  Location: [facility] ORS;  Service: General;  Laterality: N/A;   Family History  Problem Relation Age of Onset  . Cancer Mother   . Colon cancer Mother   . Breast cancer Maternal Grandmother   . Heart disease Maternal Grandmother   . Breast cancer Paternal Grandmother   . Heart disease Paternal Grandmother   . Hypertension Paternal Grandmother   . Alcohol abuse Father   .  Drug abuse Father   . Asthma Brother   . Cancer Paternal Grandfather    Social History  Substance Use Topics  . Smoking status: Never Smoker   . Smokeless tobacco: Never Used  . Alcohol Use: 0.0 oz/week    0 Standard drinks or equivalent per week     Comment: occasionally   OB History    Gravida Para Term Preterm AB TAB SAB Ectopic Multiple Living   9 7 5 2 2  2   7      Review of Systems  Constitutional: Negative for fever and chills.  Gastrointestinal: Positive for abdominal pain. Negative for nausea.  Genitourinary: Positive for difficulty urinating.  All other systems reviewed and are negative.    Allergies  Benadryl and Onion  Home Medications   Prior to Admission medications   Medication Sig Start Date End Date Taking? Authorizing Provider  esomeprazole (NEXIUM) 20 MG capsule Take 20 mg by mouth daily at 12 noon.   Yes Historical Provider, MD  FLUoxetine (PROZAC) 20 MG tablet Take 1 tablet (20 mg total) by mouth daily. 06/08/15  Yes Alycia Rossetti, MD  gabapentin (NEURONTIN) 300 MG capsule Take 1 capsule twice a day and 2 at bedtime 06/08/15  Yes Alycia Rossetti, MD  HYDROcodone-acetaminophen (NORCO/VICODIN) 5-325 MG tablet Take 1-2 tablets by mouth 2 (two) times daily as needed. 06/08/15  Yes Alycia Rossetti, MD  methocarbamol (ROBAXIN) 500  MG tablet Take 1 tablet (500 mg total) by mouth 2 (two) times daily as needed for muscle spasms. 10/18/14  Yes Alycia Rossetti, MD  ondansetron (ZOFRAN ODT) 4 MG disintegrating tablet Take 1 tablet (4 mg total) by mouth every 8 (eight) hours as needed for nausea or vomiting. 09/06/14  Yes Alycia Rossetti, MD  sucralfate (CARAFATE) 1 g tablet Take 1 tablet (1 g total) by mouth 4 (four) times daily -  with meals and at bedtime. 05/01/15  Yes Milton Ferguson, MD  albuterol (PROVENTIL HFA;VENTOLIN HFA) 108 (90 Base) MCG/ACT inhaler Inhale 2 puffs into the lungs every 4 (four) hours as needed for wheezing or shortness of breath. Patient not  taking: Reported on 07/05/2015 04/30/15   Alycia Rossetti, MD  cephALEXin (KEFLEX) 500 MG capsule Take 1 capsule (500 mg total) by mouth 4 (four) times daily. 07/05/15   Noemi Chapel, MD  naproxen (NAPROSYN) 500 MG tablet Take 1 tablet (500 mg total) by mouth 2 (two) times daily with a meal. 07/05/15   Noemi Chapel, MD  pantoprazole (PROTONIX) 40 MG tablet Take 1 tablet (40 mg total) by mouth 2 (two) times daily. Patient not taking: Reported on 07/05/2015 06/08/15   Alycia Rossetti, MD   BP 112/80 mmHg  Pulse 89  Temp(Src) 98.3 F (36.8 C) (Oral)  Resp 18  Ht 5\' 4"  (1.626 m)  Wt 262 lb (118.842 kg)  BMI 44.95 kg/m2  SpO2 100%  LMP 06/10/2015 Physical Exam  Constitutional: She appears well-developed and well-nourished. No distress.  HENT:  Head: Normocephalic and atraumatic.  Mouth/Throat: Oropharynx is clear and moist. No oropharyngeal exudate.  Eyes: Conjunctivae and EOM are normal. Pupils are equal, round, and reactive to light. Right eye exhibits no discharge. Left eye exhibits no discharge. No scleral icterus.  Neck: Normal range of motion. Neck supple. No JVD present. No thyromegaly present.  Cardiovascular: Normal rate, regular rhythm, normal heart sounds and intact distal pulses.  Exam reveals no gallop and no friction rub.   No murmur heard. Pulmonary/Chest: Effort normal and breath sounds normal. No respiratory distress. She has no wheezes. She has no rales.  Abdominal: Soft. Bowel sounds are normal. She exhibits no distension and no mass. There is tenderness.  Left lower suprapubic tenderness. No pulsating mass.  Musculoskeletal: Normal range of motion. She exhibits no edema or tenderness.  Lymphadenopathy:    She has no cervical adenopathy.  Neurological: She is alert. Coordination normal.  Skin: Skin is warm and dry. No rash noted. No erythema.  Psychiatric: She has a normal mood and affect. Her behavior is normal.  Nursing note and vitals reviewed.   ED Course  Procedures  (including critical care time) DIAGNOSTIC STUDIES: Oxygen Saturation is 100% on RA, normal by my interpretation.    COORDINATION OF CARE: 10:14 AM Discussed treatment plan with pt at bedside and pt agreed to plan.  Labs Review Labs Reviewed  URINALYSIS, ROUTINE W REFLEX MICROSCOPIC (NOT AT [facility]) - Abnormal; Notable for the following:    Hgb urine dipstick MODERATE (*)    Nitrite POSITIVE (*)    Leukocytes, UA SMALL (*)    All other components within normal limits  CBC WITH DIFFERENTIAL/PLATELET - Abnormal; Notable for the following:    Hemoglobin 9.9 (*)    HCT 32.0 (*)    MCV 77.3 (*)    MCH 23.9 (*)    RDW 17.9 (*)    All other components within normal limits  BASIC METABOLIC PANEL -  Abnormal; Notable for the following:    Creatinine, Ser 1.10 (*)    Calcium 8.8 (*)    GFR calc non Af Amer 59 (*)    All other components within normal limits  URINE MICROSCOPIC-ADD ON - Abnormal; Notable for the following:    Squamous Epithelial / LPF TOO NUMEROUS TO COUNT (*)    Bacteria, UA MANY (*)    All other components within normal limits  URINE CULTURE  PREGNANCY, URINE    Imaging Review Ct Abdomen Pelvis W Contrast  07/05/2015  CLINICAL DATA:  Left lower quadrant pain radiating and pelvis EXAM: CT ABDOMEN AND PELVIS WITH CONTRAST TECHNIQUE: Multidetector CT imaging of the abdomen and pelvis was performed using the standard protocol following bolus administration of intravenous contrast. CONTRAST:  131mL ISOVUE-300 IOPAMIDOL (ISOVUE-300) INJECTION 61% COMPARISON:  March 28, 2014 FINDINGS: Lower chest: There is slight atelectasis in the lung bases. There is a 6 x 6 mm ground-glass type nodular opacity abutting the pleura in the superior segment right lower lobe seen on axial slice 6 series 6. Lung bases otherwise clear. Hepatobiliary: No focal liver lesions are identified. Gallbladder is absent. There is no biliary duct dilatation. Pancreas: No pancreatic mass or inflammatory focus.  Spleen: No splenic lesions are evident. Adrenals/Urinary Tract: Adrenals appear unremarkable bilaterally. Kidneys bilaterally show no demonstrable mass or hydronephrosis on either side. There is no renal or ureteral calculus on either side. Urinary bladder is midline with wall thickness within normal limits. Stomach/Bowel: There are scattered sigmoid diverticula without diverticulitis. There is no bowel wall or mesenteric thickening. No bowel obstruction. No free air or portal venous air. Vascular/Lymphatic: There is no abdominal aortic aneurysm. No vascular lesions are evident on this study. There is no appreciable adenopathy in the abdomen or pelvis. Small inguinal lymph nodes are regarded as nonspecific and stable. Reproductive: Uterus is anteverted. There is a 3.2 x 2.2 cm left ovarian cyst, likely a dominant follicle. No other pelvic mass is evident. There is no pelvic fluid collection or free pelvic fluid. Other: Appendix appears normal. There is no ascites or abscess in the abdomen or pelvis. There is a a rather minimal ventral hernia containing only fat. Musculoskeletal: There are no blastic or lytic bone lesions. There is no intramuscular or abdominal wall lesion. IMPRESSION: 3.2 x 2.2 cm left ovarian cyst, likely a dominant follicle. No other pelvic mass. There are scattered sigmoid diverticula without diverticulitis. No bowel obstruction.  No abscess.  Appendix appears normal. No renal or ureteral calculus.  No hydronephrosis. Gallbladder absent. Rather minimal ventral hernia containing only fat. 6 mm ground-glass type opacity right base posteriorly. Initial follow-up with CT at 6-12 months is recommended to confirm persistence. If persistent, repeat CT is recommended every 2 years until 5 years of stability has been established. This recommendation follows the consensus statement: Guidelines for Management of Incidental Pulmonary Nodules Detected on CT Images:From the Fleischner Society 2017; published  online before print (10.1148/radiol.SG:5268862). Electronically Signed   By: Lowella Grip III M.D.   On: 07/05/2015 13:08   I have personally reviewed and evaluated these images and lab results as part of my medical decision-making.   EKG Interpretation   Date/Time:  Thursday July 05 2015 11:59:16 EDT Ventricular Rate:  67 PR Interval:  179 QRS Duration: 75 QT Interval:  428 QTC Calculation: 452 R Axis:   45 Text Interpretation:  Sinus rhythm Abnormal R-wave progression, early  transition ECG OTHERWISE WITHIN NORMAL LIMITS Since last tracing rate  slower  Confirmed by Sabra Heck  MD, [place] ([id number]) on 07/05/2015 12:53:06 PM      MDM   Final diagnoses:  Left lower quadrant pain  UTI (lower urinary tract infection)  Lung nodule   I personally performed the services described in this documentation, which was scribed in my presence. The recorded information has been reviewed and is accurate.    The patient has had persistent intermittent abdominal pain. She has a soft nontender abdomen on repeat exam, her labs show that she has a urinary tract infection, culture sent. She also has a CT scan showing a 6 mm nodule in the lower right lung. I have written a copy of her CT scan and given her a copy, I have informed her of all of the results including the nodule and have asked her to follow-up closely with her family doctor and indeed for a six-month repeat CT scan. She has expressed her understanding. She will be given the medications as below. She appears stable for discharge. Her vital signs are unremarkable.  Meds given in ED:  Medications  cefTRIAXone (ROCEPHIN) 1 g in dextrose 5 % 50 mL IVPB (not administered)  morphine 4 MG/ML injection 4 mg (4 mg Intravenous Given 07/05/15 1050)  diatrizoate meglumine-sodium (GASTROGRAFIN) 66-10 % solution 15 mL (15 mLs Oral Given 07/05/15 1037)  HYDROmorphone (DILAUDID) injection 1 mg (1 mg Intravenous Given 07/05/15 1307)  iopamidol (ISOVUE-300) 61 %  injection 100 mL (100 mLs Intravenous Contrast Given 07/05/15 1235)    New Prescriptions   CEPHALEXIN (KEFLEX) 500 MG CAPSULE    Take 1 capsule (500 mg total) by mouth 4 (four) times daily.   NAPROXEN (NAPROSYN) 500 MG TABLET    Take 1 tablet (500 mg total) by mouth 2 (two) times daily with a meal.       Noemi Chapel, MD 07/05/15 1326

## 2015-07-05 NOTE — ED Notes (Signed)
Patient back from CT. Per Deneise Lever patient was unable to complete CT related to patient having mid/upper suden onset of abdominal pain. States she feels "hot all over". Patient back in stretcher. Dr. Sabra Heck made aware.

## 2015-07-05 NOTE — Discharge Instructions (Signed)
Please obtain all of your results from medical records or have your doctors office obtain the results - share them with your doctor - you should be seen at your doctors office in the next 2 days. Call today to arrange your follow up. Take the medications as prescribed. Please review all of the medicines and only take them if you do not have an allergy to them. Please be aware that if you are taking birth control pills, taking other prescriptions, ESPECIALLY ANTIBIOTICS may make the birth control ineffective - if this is the case, either do not engage in sexual activity or use alternative methods of birth control such as condoms until you have finished the medicine and your family doctor says it is OK to restart them. If you are on a blood thinner such as COUMADIN, be aware that any other medicine that you take may cause the coumadin to either work too much, or not enough - you should have your coumadin level rechecked in next 7 days if this is the case.  ?  It is also a possibility that you have an allergic reaction to any of the medicines that you have been prescribed - Everybody reacts differently to medications and while MOST people have no trouble with most medicines, you may have a reaction such as nausea, vomiting, rash, swelling, shortness of breath. If this is the case, please stop taking the medicine immediately and contact your physician.  ?  You should return to the ER if you develop severe or worsening symptoms.    You MUST have your CT scan repeated in 6 months to further evaluate your nodule in the lung I have given you a copy of your CT scan - share with your doctor immediately

## 2015-07-06 ENCOUNTER — Telehealth: Payer: Self-pay | Admitting: Family Medicine

## 2015-07-06 MED ORDER — HYDROCODONE-ACETAMINOPHEN 5-325 MG PO TABS: 1.0000 | ORAL_TABLET | Freq: Two times a day (BID) | ORAL | Status: DC | PRN

## 2015-07-06 NOTE — Telephone Encounter (Signed)
Okay to refill? 

## 2015-07-06 NOTE — Telephone Encounter (Signed)
Ok to refill??  Last office visit/ refill 06/08/2015.

## 2015-07-06 NOTE — Telephone Encounter (Signed)
Pt is requesting a refill of Hydrocodone 5-325 [phone]

## 2015-07-06 NOTE — Telephone Encounter (Signed)
Prescription printed and patient made aware to come to office to pick up after 4:30pm on 07/06/2015.

## 2015-07-07 LAB — URINE CULTURE

## 2015-07-08 ENCOUNTER — Telehealth (HOSPITAL_BASED_OUTPATIENT_CLINIC_OR_DEPARTMENT_OTHER): Payer: Self-pay

## 2015-07-08 NOTE — Telephone Encounter (Signed)
Post ED Visit - Positive Culture Follow-up  Culture report reviewed by antimicrobial stewardship pharmacist:  []  Elenor Quinones, Pharm.D. []  Heide Guile, Pharm.D., BCPS []  Parks Neptune, Pharm.D. []  Alycia Rossetti, Pharm.D., BCPS []  [place], Pharm.D., BCPS, AAHIVP [x]  Legrand Como, Pharm.D., BCPS, AAHIVP []  Milus Glazier, Pharm.D. []  Rob Evette Doffing, Pharm.D.  Positive urine culture Treated with Cephalexin, organism sensitive to the same and no further patient follow-up is required at this time.  Genia Del 07/08/2015, 4:00 PM

## 2015-07-31 ENCOUNTER — Other Ambulatory Visit: Payer: Self-pay | Admitting: Family Medicine

## 2015-07-31 MED ORDER — HYDROCODONE-ACETAMINOPHEN 5-325 MG PO TABS: 1.0000 | ORAL_TABLET | Freq: Two times a day (BID) | ORAL | Status: DC | PRN

## 2015-07-31 NOTE — Telephone Encounter (Signed)
Per MD, ok to refill.   Prescription printed.

## 2015-07-31 NOTE — Telephone Encounter (Signed)
Ok to refill??  Last office visit 06/08/2015.  Last refill 07/06/2015.

## 2015-07-31 NOTE — Telephone Encounter (Signed)
Patient would like rx for her hydrocodone  [phone]

## 2015-07-31 NOTE — Telephone Encounter (Signed)
okay

## 2015-08-03 ENCOUNTER — Encounter (HOSPITAL_COMMUNITY): Payer: Self-pay | Admitting: Emergency Medicine

## 2015-08-03 ENCOUNTER — Emergency Department (HOSPITAL_COMMUNITY)
Admission: EM | Admit: 2015-08-03 | Discharge: 2015-08-03 | Disposition: A | Discharge status: Home / Self Care | Payer: BLUE CROSS/BLUE SHIELD | Attending: Emergency Medicine | Admitting: Emergency Medicine

## 2015-08-03 ENCOUNTER — Emergency Department (HOSPITAL_COMMUNITY): Payer: BLUE CROSS/BLUE SHIELD

## 2015-08-03 DIAGNOSIS — N189 Chronic kidney disease, unspecified: Secondary | ICD-10-CM | POA: Insufficient documentation

## 2015-08-03 DIAGNOSIS — R1030 Lower abdominal pain, unspecified: Secondary | ICD-10-CM

## 2015-08-03 DIAGNOSIS — R1032 Left lower quadrant pain: Secondary | ICD-10-CM | POA: Insufficient documentation

## 2015-08-03 DIAGNOSIS — Z79899 Other long term (current) drug therapy: Secondary | ICD-10-CM | POA: Insufficient documentation

## 2015-08-03 DIAGNOSIS — Z8541 Personal history of malignant neoplasm of cervix uteri: Secondary | ICD-10-CM | POA: Insufficient documentation

## 2015-08-03 DIAGNOSIS — R52 Pain, unspecified: Secondary | ICD-10-CM

## 2015-08-03 LAB — URINALYSIS, ROUTINE W REFLEX MICROSCOPIC
BILIRUBIN URINE: NEGATIVE
Glucose, UA: NEGATIVE mg/dL
Hgb urine dipstick: NEGATIVE
KETONES UR: NEGATIVE mg/dL
LEUKOCYTES UA: NEGATIVE
NITRITE: NEGATIVE
PH: 6.5 (ref 5.0–8.0)
PROTEIN: NEGATIVE mg/dL
Specific Gravity, Urine: 1.02 (ref 1.005–1.030)

## 2015-08-03 NOTE — ED Notes (Signed)
C/o left lower abdomen.  Was seen here 2 weeks ago for same pain.  Rates pain 9/10.

## 2015-08-03 NOTE — ED Provider Notes (Signed)
CSN: [id number]     Arrival date & time 08/03/15  32 History   First MD Initiated Contact with Patient 08/03/15 1137     Chief Complaint  Patient presents with  . Abdominal Pain     (Consider location/radiation/quality/duration/timing/severity/associated sxs/prior Treatment) Patient is a 45 y.o. female presenting with abdominal pain. The history is provided by the patient. No language interpreter was used.  Abdominal Pain Pain location:  LLQ and RLQ Pain quality: aching   Pain radiates to:  Does not radiate Pain severity:  Moderate Onset quality:  Gradual Duration:  4 weeks Timing:  Intermittent Progression:  Unchanged Chronicity:  New Context: not alcohol use   Relieved by:  Nothing Worsened by:  Nothing tried Ineffective treatments:  None tried Associated symptoms: no dysuria   Risk factors: not pregnant     Past Medical History  Diagnosis Date  . Left knee pain   . Migraine   . Cancer ([place])     cervical cx at age 56  . Cervical cancer ([place])   . Chronic back pain   . Sciatica   . Anemia   . Chronic kidney disease   . Hypotension   . Vaginal Pap smear, abnormal   . DDD (degenerative disc disease), lumbar     Facet arthritis  . Peripheral neuropathy [facility])    Past Surgical History  Procedure Laterality Date  . Tubal ligation    . Cholecystectomy N/A 01/20/2013    Procedure: LAPAROSCOPIC CHOLECYSTECTOMY WITH INTRAOPERATIVE CHOLANGIOGRAM;  Surgeon: Shann Medal, MD;  Location: [facility];  Service: General;  Laterality: N/A;   Family History  Problem Relation Age of Onset  . Cancer Mother   . Colon cancer Mother   . Breast cancer Maternal Grandmother   . Heart disease Maternal Grandmother   . Breast cancer Paternal Grandmother   . Heart disease Paternal Grandmother   . Hypertension Paternal Grandmother   . Alcohol abuse Father   . Drug abuse Father   . Asthma Brother   . Cancer Paternal Grandfather    Social History  Substance Use Topics  . Smoking  status: Never Smoker   . Smokeless tobacco: Never Used  . Alcohol Use: 0.0 oz/week    0 Standard drinks or equivalent per week     Comment: occasionally   OB History    Gravida Para Term Preterm AB TAB SAB Ectopic Multiple Living   9 7 5 2 2  2   7      Review of Systems  Gastrointestinal: Positive for abdominal pain.  Genitourinary: Negative for dysuria.  All other systems reviewed and are negative.     Allergies  Benadryl and Onion  Home Medications   Prior to Admission medications   Medication Sig Start Date End Date Taking? Authorizing Provider  esomeprazole (NEXIUM) 20 MG capsule Take 20 mg by mouth daily at 12 noon.   Yes Historical Provider, MD  FLUoxetine (PROZAC) 20 MG tablet Take 1 tablet (20 mg total) by mouth daily. 06/08/15  Yes Alycia Rossetti, MD  gabapentin (NEURONTIN) 300 MG capsule Take 1 capsule twice a day and 2 at bedtime 06/08/15  Yes Alycia Rossetti, MD  HYDROcodone-acetaminophen (NORCO/VICODIN) 5-325 MG tablet Take 1-2 tablets by mouth 2 (two) times daily as needed. 07/31/15  Yes Alycia Rossetti, MD  methocarbamol (ROBAXIN) 500 MG tablet Take 1 tablet (500 mg total) by mouth 2 (two) times daily as needed for muscle spasms. 10/18/14  Yes Alycia Rossetti, MD  ondansetron (ZOFRAN ODT) 4 MG disintegrating tablet Take 1 tablet (4 mg total) by mouth every 8 (eight) hours as needed for nausea or vomiting. 09/06/14  Yes Alycia Rossetti, MD  sucralfate (CARAFATE) 1 g tablet Take 1 tablet (1 g total) by mouth 4 (four) times daily -  with meals and at bedtime. 05/01/15  Yes Milton Ferguson, MD  albuterol (PROVENTIL HFA;VENTOLIN HFA) 108 (90 Base) MCG/ACT inhaler Inhale 2 puffs into the lungs every 4 (four) hours as needed for wheezing or shortness of breath. Patient not taking: Reported on 07/05/2015 04/30/15   Alycia Rossetti, MD  cephALEXin (KEFLEX) 500 MG capsule Take 1 capsule (500 mg total) by mouth 4 (four) times daily. Patient not taking: Reported on 08/03/2015 07/05/15    Noemi Chapel, MD  naproxen (NAPROSYN) 500 MG tablet Take 1 tablet (500 mg total) by mouth 2 (two) times daily with a meal. Patient not taking: Reported on 08/03/2015 07/05/15   Noemi Chapel, MD  pantoprazole (PROTONIX) 40 MG tablet Take 1 tablet (40 mg total) by mouth 2 (two) times daily. Patient not taking: Reported on 07/05/2015 06/08/15   Alycia Rossetti, MD   BP 132/79 mmHg  Pulse 88  Temp(Src) 97.9 F (36.6 C) (Oral)  Resp 16  Ht 5\' 4"  (1.626 m)  Wt 114.306 kg  BMI 43.23 kg/m2  SpO2 99%  LMP 07/11/2015 Physical Exam  Constitutional: She is oriented to person, place, and time. She appears well-developed and well-nourished.  HENT:  Head: Normocephalic and atraumatic.  Eyes: Conjunctivae and EOM are normal. Pupils are equal, round, and reactive to light.  Neck: Normal range of motion.  Cardiovascular: Normal rate.   Pulmonary/Chest: Effort normal.  Abdominal: She exhibits no distension. There is tenderness.  Musculoskeletal: Normal range of motion.  Neurological: She is alert and oriented to person, place, and time.  Psychiatric: She has a normal mood and affect.  Nursing note and vitals reviewed.   ED Course  Procedures (including critical care time) Labs Review Labs Reviewed  URINALYSIS, ROUTINE W REFLEX MICROSCOPIC (NOT AT [facility])    Imaging Review No results found. I have personally reviewed and evaluated these images and lab results as part of my medical decision-making.   EKG Interpretation None      MDM Pt had to leave.  She received a call that her Mother was in [facility] ED.   I called pt with results.  Pt advised to follow up with her MD for recheck.  Ibuprofen for pain   Final diagnoses:  Lower abdominal pain    An After Visit Summary was printed and given to the patient.    Hollace Kinnier [place], PA-C 08/03/15 1447  Merrily Pew, MD 08/03/15 [phone]

## 2015-08-03 NOTE — ED Notes (Signed)
PT stated her mother was at chemo and is at [facility] long ED and she must leave at this time. PA made aware and discharge papers done and PA to follow up with pt via phone of Ultrasound results.

## 2015-08-03 NOTE — Discharge Instructions (Signed)

## 2015-08-06 ENCOUNTER — Ambulatory Visit: Payer: Self-pay | Admitting: Family Medicine

## 2015-08-10 ENCOUNTER — Emergency Department (HOSPITAL_COMMUNITY)
Admission: EM | Admit: 2015-08-10 | Discharge: 2015-08-10 | Disposition: A | Discharge status: Home / Self Care | Payer: Self-pay | Attending: Emergency Medicine | Admitting: Emergency Medicine

## 2015-08-10 ENCOUNTER — Encounter (HOSPITAL_COMMUNITY): Payer: Self-pay | Admitting: Emergency Medicine

## 2015-08-10 ENCOUNTER — Emergency Department (HOSPITAL_COMMUNITY): Payer: Self-pay

## 2015-08-10 DIAGNOSIS — R1032 Left lower quadrant pain: Secondary | ICD-10-CM | POA: Insufficient documentation

## 2015-08-10 DIAGNOSIS — Z7982 Long term (current) use of aspirin: Secondary | ICD-10-CM | POA: Insufficient documentation

## 2015-08-10 DIAGNOSIS — Z8541 Personal history of malignant neoplasm of cervix uteri: Secondary | ICD-10-CM | POA: Insufficient documentation

## 2015-08-10 DIAGNOSIS — N189 Chronic kidney disease, unspecified: Secondary | ICD-10-CM | POA: Insufficient documentation

## 2015-08-10 DIAGNOSIS — Z79899 Other long term (current) drug therapy: Secondary | ICD-10-CM | POA: Insufficient documentation

## 2015-08-10 DIAGNOSIS — R110 Nausea: Secondary | ICD-10-CM | POA: Insufficient documentation

## 2015-08-10 LAB — URINALYSIS, ROUTINE W REFLEX MICROSCOPIC
Bilirubin Urine: NEGATIVE
GLUCOSE, UA: NEGATIVE mg/dL
HGB URINE DIPSTICK: NEGATIVE
Ketones, ur: NEGATIVE mg/dL
Leukocytes, UA: NEGATIVE
Nitrite: NEGATIVE
Protein, ur: NEGATIVE mg/dL
pH: 5.5 (ref 5.0–8.0)

## 2015-08-10 LAB — CBC WITH DIFFERENTIAL/PLATELET
Basophils Absolute: 0 10*3/uL (ref 0.0–0.1)
Basophils Relative: 1 %
Eosinophils Absolute: 0.1 10*3/uL (ref 0.0–0.7)
Eosinophils Relative: 2 %
HCT: 33.5 % — ABNORMAL LOW (ref 36.0–46.0)
Hemoglobin: 10.5 g/dL — ABNORMAL LOW (ref 12.0–15.0)
Lymphocytes Relative: 32 %
Lymphs Abs: 1.8 10*3/uL (ref 0.7–4.0)
MCH: 24.1 pg — ABNORMAL LOW (ref 26.0–34.0)
MCHC: 31.3 g/dL (ref 30.0–36.0)
MCV: 76.8 fL — ABNORMAL LOW (ref 78.0–100.0)
Monocytes Absolute: 0.5 10*3/uL (ref 0.1–1.0)
Monocytes Relative: 10 %
Neutro Abs: 3.1 10*3/uL (ref 1.7–7.7)
Neutrophils Relative %: 56 %
Platelets: 266 10*3/uL (ref 150–400)
RBC: 4.36 MIL/uL (ref 3.87–5.11)
RDW: 18.1 % — ABNORMAL HIGH (ref 11.5–15.5)
WBC: 5.6 10*3/uL (ref 4.0–10.5)

## 2015-08-10 LAB — COMPREHENSIVE METABOLIC PANEL
ALBUMIN: 3.6 g/dL (ref 3.5–5.0)
ALT: 11 U/L — ABNORMAL LOW (ref 14–54)
AST: 15 U/L (ref 15–41)
Alkaline Phosphatase: 83 U/L (ref 38–126)
Anion gap: 6 (ref 5–15)
BUN: 13 mg/dL (ref 6–20)
CHLORIDE: 106 mmol/L (ref 101–111)
CO2: 24 mmol/L (ref 22–32)
Calcium: 8.6 mg/dL — ABNORMAL LOW (ref 8.9–10.3)
Creatinine, Ser: 1.21 mg/dL — ABNORMAL HIGH (ref 0.44–1.00)
GFR calc Af Amer: >60 mL/min (ref >60)
GFR calc non Af Amer: 53 mL/min — ABNORMAL LOW (ref >60)
GLUCOSE: 102 mg/dL — AB (ref 65–99)
POTASSIUM: 3.7 mmol/L (ref 3.5–5.1)
Sodium: 136 mmol/L (ref 135–145)
Total Bilirubin: 0.4 mg/dL (ref 0.3–1.2)
Total Protein: 7.2 g/dL (ref 6.5–8.1)

## 2015-08-10 LAB — PREGNANCY, URINE: Preg Test, Ur: NEGATIVE

## 2015-08-10 LAB — LIPASE, BLOOD: LIPASE: 42 U/L (ref 11–51)

## 2015-08-10 MED ORDER — MORPHINE SULFATE (PF) 4 MG/ML IV SOLN
4.0000 mg | Freq: Once | INTRAVENOUS | Status: AC
  Administered 2015-08-10: 4 mg via INTRAVENOUS
  Filled 2015-08-10: qty 1

## 2015-08-10 MED ORDER — SUCRALFATE 1 G PO TABS: 1.0000 g | ORAL_TABLET | Freq: Three times a day (TID) | ORAL | Status: DC

## 2015-08-10 MED ORDER — SUCRALFATE 1 GM/10ML PO SUSP
1.0000 g | Freq: Once | ORAL | Status: AC
  Administered 2015-08-10: 1 g via ORAL
  Filled 2015-08-10: qty 10

## 2015-08-10 MED ORDER — ONDANSETRON HCL 4 MG/2ML IJ SOLN
4.0000 mg | Freq: Once | INTRAMUSCULAR | Status: AC | PRN
  Administered 2015-08-10: 4 mg via INTRAVENOUS
  Filled 2015-08-10: qty 2

## 2015-08-10 MED ORDER — HYDROCODONE-ACETAMINOPHEN 5-325 MG PO TABS: 1.0000 | ORAL_TABLET | Freq: Four times a day (QID) | ORAL | Status: DC | PRN

## 2015-08-10 MED ORDER — IOPAMIDOL (ISOVUE-300) INJECTION 61%
100.0000 mL | Freq: Once | INTRAVENOUS | Status: AC | PRN
  Administered 2015-08-10: 100 mL via INTRAVENOUS

## 2015-08-10 MED ORDER — HYDROMORPHONE HCL 1 MG/ML IJ SOLN
1.0000 mg | Freq: Once | INTRAMUSCULAR | Status: AC
  Administered 2015-08-10: 1 mg via INTRAVENOUS
  Filled 2015-08-10: qty 1

## 2015-08-10 MED ORDER — SODIUM CHLORIDE 0.9 % IV BOLUS (SEPSIS)
1000.0000 mL | Freq: Once | INTRAVENOUS | Status: AC
  Administered 2015-08-10: 1000 mL via INTRAVENOUS

## 2015-08-10 MED ORDER — ONDANSETRON HCL 4 MG/2ML IJ SOLN: 4.0000 mg | Freq: Once | INTRAMUSCULAR | Status: DC

## 2015-08-10 MED ORDER — GI COCKTAIL ~~LOC~~
30.0000 mL | Freq: Once | ORAL | Status: AC
  Administered 2015-08-10: 30 mL via ORAL
  Filled 2015-08-10: qty 30

## 2015-08-10 NOTE — ED Notes (Signed)
Patient does not want to be stuck for labs until she gets her IV.  I notified the nurse

## 2015-08-10 NOTE — ED Notes (Signed)
Pt reports ongoing LLQ pain related to ovarian cyst unrelieved with at home medications; emesis.

## 2015-08-10 NOTE — ED Provider Notes (Addendum)
CSN: [id number]     Arrival date & time 08/10/15  1243 History   First MD Initiated Contact with Patient 08/10/15 1307     Chief Complaint  Patient presents with  . Abdominal Pain  . Emesis     (Consider location/radiation/quality/duration/timing/severity/associated sxs/prior Treatment) HPI Abdominal Pain: Patient complains of abdominal pain. The pain is located in the LLQ. The pain is described as cramping and sharp, and is 7/10 in intensity. Onset was 5 days ago. Symptoms have been gradually worsening since. Aggravating factors include activity and eating.  Alleviating factors include none. Associated symptoms include nausea. The patient denies anorexia, arthralgias, belching, chills, constipation, dysuria, fever, flatus, frequency, headache, hematochezia, hematuria, melena, myalgias and sweats. Past Medical History  Diagnosis Date  . Left knee pain   . Migraine   . Cancer ([place])     cervical cx at age 7  . Cervical cancer ([place])   . Chronic back pain   . Sciatica   . Anemia   . Chronic kidney disease   . Hypotension   . Vaginal Pap smear, abnormal   . DDD (degenerative disc disease), lumbar     Facet arthritis  . Peripheral neuropathy [facility])    Past Surgical History  Procedure Laterality Date  . Tubal ligation    . Cholecystectomy N/A 01/20/2013    Procedure: LAPAROSCOPIC CHOLECYSTECTOMY WITH INTRAOPERATIVE CHOLANGIOGRAM;  Surgeon: Shann Medal, MD;  Location: [facility];  Service: General;  Laterality: N/A;   Family History  Problem Relation Age of Onset  . Cancer Mother   . Colon cancer Mother   . Breast cancer Maternal Grandmother   . Heart disease Maternal Grandmother   . Breast cancer Paternal Grandmother   . Heart disease Paternal Grandmother   . Hypertension Paternal Grandmother   . Alcohol abuse Father   . Drug abuse Father   . Asthma Brother   . Cancer Paternal Grandfather    Social History  Substance Use Topics  . Smoking status: Never Smoker   . Smokeless  tobacco: Never Used  . Alcohol Use: 0.0 oz/week    0 Standard drinks or equivalent per week     Comment: occasionally   OB History    Gravida Para Term Preterm AB TAB SAB Ectopic Multiple Living   9 7 5 2 2  2   7      Review of Systems  Constitutional: Negative for fever, chills and diaphoresis.  HENT: Negative for trouble swallowing.   Respiratory: Negative for cough, choking, chest tightness and shortness of breath.   Cardiovascular: Negative for chest pain.  Gastrointestinal: Positive for nausea and abdominal pain. Negative for diarrhea, constipation, blood in stool, abdominal distention, anal bleeding and rectal pain.  Endocrine: Negative for polydipsia, polyphagia and polyuria.  Genitourinary: Positive for flank pain. Negative for dysuria, frequency, hematuria, decreased urine volume, vaginal bleeding, vaginal discharge, difficulty urinating, vaginal pain and dyspareunia.  Skin: Negative for rash.  Neurological: Negative for dizziness, weakness, light-headedness and headaches.  Psychiatric/Behavioral: Negative for confusion.      Allergies  Benadryl and Onion  Home Medications   Prior to Admission medications   Medication Sig Start Date End Date Taking? Authorizing Provider  aspirin EC 81 MG tablet Take 81 mg by mouth daily.   Yes Historical Provider, MD  esomeprazole (NEXIUM) 20 MG capsule Take 20 mg by mouth daily at 12 noon.   Yes Historical Provider, MD  FLUoxetine (PROZAC) 20 MG tablet Take 1 tablet (20 mg total) by mouth  daily. 06/08/15  Yes Alycia Rossetti, MD  gabapentin (NEURONTIN) 300 MG capsule Take 1 capsule twice a day and 2 at bedtime Patient taking differently: Take 300-600 mg by mouth 2 (two) times daily as needed (pain). Take 1 capsule twice a day and 2 at bedtime 06/08/15  Yes Alycia Rossetti, MD  HYDROcodone-acetaminophen (NORCO/VICODIN) 5-325 MG tablet Take 1-2 tablets by mouth 2 (two) times daily as needed. 07/31/15  Yes Alycia Rossetti, MD  methocarbamol  (ROBAXIN) 500 MG tablet Take 1 tablet (500 mg total) by mouth 2 (two) times daily as needed for muscle spasms. 10/18/14  Yes Alycia Rossetti, MD  ondansetron (ZOFRAN ODT) 4 MG disintegrating tablet Take 1 tablet (4 mg total) by mouth every 8 (eight) hours as needed for nausea or vomiting. 09/06/14  Yes Alycia Rossetti, MD  sucralfate (CARAFATE) 1 g tablet Take 1 tablet (1 g total) by mouth 4 (four) times daily -  with meals and at bedtime. 05/01/15  Yes Milton Ferguson, MD  albuterol (PROVENTIL HFA;VENTOLIN HFA) 108 (90 Base) MCG/ACT inhaler Inhale 2 puffs into the lungs every 4 (four) hours as needed for wheezing or shortness of breath. Patient not taking: Reported on 07/05/2015 04/30/15   Alycia Rossetti, MD  cephALEXin (KEFLEX) 500 MG capsule Take 1 capsule (500 mg total) by mouth 4 (four) times daily. Patient not taking: Reported on 08/03/2015 07/05/15   Noemi Chapel, MD  naproxen (NAPROSYN) 500 MG tablet Take 1 tablet (500 mg total) by mouth 2 (two) times daily with a meal. Patient not taking: Reported on 08/03/2015 07/05/15   Noemi Chapel, MD  pantoprazole (PROTONIX) 40 MG tablet Take 1 tablet (40 mg total) by mouth 2 (two) times daily. Patient not taking: Reported on 07/05/2015 06/08/15   Alycia Rossetti, MD   BP 122/77 mmHg  Pulse 71  Temp(Src) 98.4 F (36.9 C) (Oral)  Resp 19  SpO2 100%  LMP 07/11/2015 Physical Exam  Constitutional: She is oriented to person, place, and time. She appears well-developed and well-nourished. No distress.  HENT:  Head: Normocephalic and atraumatic.  Mouth/Throat: Oropharynx is clear and moist.  Eyes: Pupils are equal, round, and reactive to light.  Neck: Normal range of motion. Neck supple.  Cardiovascular: Normal rate, regular rhythm and normal heart sounds.  Exam reveals no gallop and no friction rub.   No murmur heard. Pulmonary/Chest: Effort normal and breath sounds normal. No respiratory distress. She has no wheezes.  Abdominal: Soft. Bowel sounds are normal.  She exhibits no distension. There is tenderness in the left lower quadrant. There is no rigidity, no rebound and no guarding. No hernia.    Neurological: She is alert and oriented to person, place, and time. She exhibits normal muscle tone. Coordination normal.  Skin: Skin is warm and dry. No rash noted. No erythema.  Psychiatric: She has a normal mood and affect. Her behavior is normal.  Nursing note and vitals reviewed.   ED Course  Procedures (including critical care time) Labs Review Labs Reviewed  COMPREHENSIVE METABOLIC PANEL - Abnormal; Notable for the following:    Glucose, Bld 102 (*)    Creatinine, Ser 1.21 (*)    Calcium 8.6 (*)    ALT 11 (*)    GFR calc non Af Amer 53 (*)    All other components within normal limits  URINALYSIS, ROUTINE W REFLEX MICROSCOPIC (NOT AT [facility]) - Abnormal; Notable for the following:    Specific Gravity, Urine >1.046 (*)  All other components within normal limits  CBC WITH DIFFERENTIAL/PLATELET - Abnormal; Notable for the following:    Hemoglobin 10.5 (*)    HCT 33.5 (*)    MCV 76.8 (*)    MCH 24.1 (*)    RDW 18.1 (*)    All other components within normal limits  LIPASE, BLOOD  PREGNANCY, URINE    Imaging Review Dg Chest 2 View  08/10/2015  CLINICAL DATA:  Cough and shortness of breath.  Cervical cancer. EXAM: CHEST  2 VIEW COMPARISON:  05/01/2015 chest radiograph. FINDINGS: Stable cardiomediastinal silhouette with normal heart size. No pneumothorax. No pleural effusion. Lungs appear clear, with no acute consolidative airspace disease and no pulmonary edema. IMPRESSION: No active cardiopulmonary disease. Electronically Signed   By: Ilona Sorrel M.D.   On: 08/10/2015 15:46   US Transvaginal Non-ob  08/10/2015  CLINICAL DATA:  Left-sided pelvic pain for 1 week EXAM: TRANSABDOMINAL AND TRANSVAGINAL ULTRASOUND OF PELVIS DOPPLER ULTRASOUND OF OVARIES TECHNIQUE: Study was performed transabdominally to optimize pelvic field of view evaluation and  transvaginally to optimize internal visceral architecture evaluation. Color and duplex Doppler ultrasound was utilized to evaluate blood flow to the right ovary. Doppler evaluation of the left ovary could not be performed due to nonvisualization of left ovary. COMPARISON:  August 03, 2015 FINDINGS: Uterus Measurements: 9.4 x 6.5 x 6.2 cm. No fibroids or other mass visualized. Endometrium Thickness: 13 mm.  No focal abnormality visualized. Right ovary Measurements: 5.4 x 3.8 x 4.0 cm. The complex cystic structure seen in the right adnexa on recent study is currently not apparent. No focal right adnexal/pelvic mass appreciable on current examination. Left ovary Not visualized by transabdominal or transvaginal technique. Pulsed Doppler evaluation of the right ovary demonstrates normal low-resistance arterial and venous waveforms. Other findings Small amount of free fluid. IMPRESSION: The previously noted complex cystic right adnexal mass is no longer apparent by ultrasound. Left ovary could not be seen by transabdominal or transvaginal technique as was the case on recent prior study. Small amount of free pelvic fluid. No intrauterine lesion evident. No evidence of right ovarian torsion. Given persistent left pelvic pain and persistent nonvisualization of the left ovary, correlation with pelvic MR may be reasonable for further assessment. Electronically Signed   By: Lowella Grip III M.D.   On: 08/10/2015 14:59   US Pelvis Complete  08/10/2015  CLINICAL DATA:  Left-sided pelvic pain for 1 week EXAM: TRANSABDOMINAL AND TRANSVAGINAL ULTRASOUND OF PELVIS DOPPLER ULTRASOUND OF OVARIES TECHNIQUE: Study was performed transabdominally to optimize pelvic field of view evaluation and transvaginally to optimize internal visceral architecture evaluation. Color and duplex Doppler ultrasound was utilized to evaluate blood flow to the right ovary. Doppler evaluation of the left ovary could not be performed due to nonvisualization  of left ovary. COMPARISON:  August 03, 2015 FINDINGS: Uterus Measurements: 9.4 x 6.5 x 6.2 cm. No fibroids or other mass visualized. Endometrium Thickness: 13 mm.  No focal abnormality visualized. Right ovary Measurements: 5.4 x 3.8 x 4.0 cm. The complex cystic structure seen in the right adnexa on recent study is currently not apparent. No focal right adnexal/pelvic mass appreciable on current examination. Left ovary Not visualized by transabdominal or transvaginal technique. Pulsed Doppler evaluation of the right ovary demonstrates normal low-resistance arterial and venous waveforms. Other findings Small amount of free fluid. IMPRESSION: The previously noted complex cystic right adnexal mass is no longer apparent by ultrasound. Left ovary could not be seen by transabdominal or transvaginal technique as was  the case on recent prior study. Small amount of free pelvic fluid. No intrauterine lesion evident. No evidence of right ovarian torsion. Given persistent left pelvic pain and persistent nonvisualization of the left ovary, correlation with pelvic MR may be reasonable for further assessment. Electronically Signed   By: Lowella Grip III M.D.   On: 08/10/2015 14:59   Ct Abdomen Pelvis W Contrast  08/10/2015  CLINICAL DATA:  Left lower quadrant pain.  Ovarian cyst.  Vomiting. EXAM: CT ABDOMEN AND PELVIS WITH CONTRAST TECHNIQUE: Multidetector CT imaging of the abdomen and pelvis was performed using the standard protocol following bolus administration of intravenous contrast. CONTRAST:  170mL ISOVUE-300 IOPAMIDOL (ISOVUE-300) INJECTION 61% COMPARISON:  Ultrasound same day.  CT 07/05/2015. FINDINGS: Lung bases are clear.  No pleural or pericardial fluid. The liver has normal appearance without focal lesions. Previous cholecystectomy. The spleen is normal. The pancreas is normal. The adrenal glands are normal. The kidneys are normal. The aorta and IVC are normal. No retroperitoneal mass or adenopathy. No bowel  pathology. Appendix is normal. The uterus appears normal. Cyst previously seen associated with the left ovary is no longer present. Left ovary appears completely normal. Few small follicular cysts associated with the right ovary. If no free fluid. Bladder appears normal. No significant bone finding. IMPRESSION: Normal CT scan of the abdomen and pelvis. Previously seen cyst of the left ovary is no longer present. Left ovary appears normal and unremarkable. No cause of left lower quadrant abdominal/ pelvic pain is identified. Electronically Signed   By: Nelson Chimes M.D.   On: 08/10/2015 16:36   [place] Art/ven Flow Abd Pelv Doppler  08/10/2015  CLINICAL DATA:  Left-sided pelvic pain for 1 week EXAM: TRANSABDOMINAL AND TRANSVAGINAL ULTRASOUND OF PELVIS DOPPLER ULTRASOUND OF OVARIES TECHNIQUE: Study was performed transabdominally to optimize pelvic field of view evaluation and transvaginally to optimize internal visceral architecture evaluation. Color and duplex Doppler ultrasound was utilized to evaluate blood flow to the right ovary. Doppler evaluation of the left ovary could not be performed due to nonvisualization of left ovary. COMPARISON:  August 03, 2015 FINDINGS: Uterus Measurements: 9.4 x 6.5 x 6.2 cm. No fibroids or other mass visualized. Endometrium Thickness: 13 mm.  No focal abnormality visualized. Right ovary Measurements: 5.4 x 3.8 x 4.0 cm. The complex cystic structure seen in the right adnexa on recent study is currently not apparent. No focal right adnexal/pelvic mass appreciable on current examination. Left ovary Not visualized by transabdominal or transvaginal technique. Pulsed Doppler evaluation of the right ovary demonstrates normal low-resistance arterial and venous waveforms. Other findings Small amount of free fluid. IMPRESSION: The previously noted complex cystic right adnexal mass is no longer apparent by ultrasound. Left ovary could not be seen by transabdominal or transvaginal technique as was  the case on recent prior study. Small amount of free pelvic fluid. No intrauterine lesion evident. No evidence of right ovarian torsion. Given persistent left pelvic pain and persistent nonvisualization of the left ovary, correlation with pelvic MR may be reasonable for further assessment. Electronically Signed   By: Lowella Grip III M.D.   On: 08/10/2015 14:59   I have personally reviewed and evaluated these images and lab results as part of my medical decision-making.   EKG Interpretation   Date/Time:  Friday August 10 2015 15:13:05 EDT Ventricular Rate:  63 PR Interval:    QRS Duration: 73 QT Interval:  437 QTC Calculation: 448 R Axis:   34 Text Interpretation:  Sinus rhythm since last  tracing no significant  change Confirmed by Eulis Foster  MD, Vira Agar [id number]) on 08/10/2015 3:17:10 PM Also  confirmed by Eulis Foster  MD, ELLIOTT [phone]), editor Rolla Plate, Joelene Millin [phone])   on 08/10/2015 3:31:23 PM      MDM   Final diagnoses:  None      Patient has negative testing here today to explain her issue. The patient will need follow up with her PCP for outpatient care and follow up. THe patient agrees to the plan and all questions were answered.   Dalia Heading, PA-C 08/13/15 [place], DO 08/13/15 [place], PA-C 09/10/15 [place], DO 09/17/15 1126

## 2015-08-10 NOTE — ED Notes (Signed)
Pt ambulatory and independent at discharge.  

## 2015-08-10 NOTE — Discharge Instructions (Signed)
Return here as needed.  Follow-up with your primary care doctor, increase your fluid intake and rest as much as possible °

## 2015-08-10 NOTE — ED Notes (Signed)
Patient transported to CT 

## 2015-08-10 NOTE — ED Notes (Signed)
Pt in US

## 2015-09-03 ENCOUNTER — Telehealth: Payer: Self-pay | Admitting: Family Medicine

## 2015-09-03 MED ORDER — HYDROCODONE-ACETAMINOPHEN 5-325 MG PO TABS: 1.0000 | ORAL_TABLET | Freq: Two times a day (BID) | ORAL | 0 refills | Status: DC | PRN

## 2015-09-03 NOTE — Telephone Encounter (Signed)
Ok to refill??  Last office visit 06/08/2015.  Last refill 07/31/2015 (1-2 tabs PO BID PRN, #100).  Of note, given #20 from ED on 08/10/2015.

## 2015-09-03 NOTE — Telephone Encounter (Signed)
Okay to refill? 

## 2015-09-03 NOTE — Telephone Encounter (Signed)
Patient is calling to get rx for hydrocodone today if possible  [phone]

## 2015-09-03 NOTE — Telephone Encounter (Signed)
Prescription printed and patient made aware to come to office to pick up after 2pm on 09/03/2015.

## 2015-09-19 ENCOUNTER — Encounter: Payer: Self-pay | Admitting: Family Medicine

## 2015-09-25 ENCOUNTER — Encounter: Payer: Self-pay | Admitting: Family Medicine

## 2015-09-25 ENCOUNTER — Encounter: Payer: Self-pay | Admitting: *Deleted

## 2015-09-25 ENCOUNTER — Ambulatory Visit (INDEPENDENT_AMBULATORY_CARE_PROVIDER_SITE_OTHER): Payer: Self-pay | Admitting: Family Medicine

## 2015-09-25 VITALS — BP 132/78 | HR 84 | Temp 98.1°F | Resp 16 | Ht 65.0 in | Wt 261.0 lb

## 2015-09-25 DIAGNOSIS — R55 Syncope and collapse: Secondary | ICD-10-CM

## 2015-09-25 DIAGNOSIS — N3000 Acute cystitis without hematuria: Secondary | ICD-10-CM

## 2015-09-25 LAB — URINALYSIS, ROUTINE W REFLEX MICROSCOPIC
BILIRUBIN URINE: NEGATIVE
Glucose, UA: NEGATIVE
Leukocytes, UA: NEGATIVE
Nitrite: NEGATIVE
Protein, ur: NEGATIVE
SPECIFIC GRAVITY, URINE: 1.015 (ref 1.001–1.035)
pH: 7 (ref 5.0–8.0)

## 2015-09-25 LAB — URINALYSIS, MICROSCOPIC ONLY
CRYSTALS: NONE SEEN [HPF]
Casts: NONE SEEN [LPF]
Yeast: NONE SEEN [HPF]
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
## 2015-09-25 NOTE — Patient Instructions (Signed)
F/U as previously scheduled  We will call with urine results

## 2015-09-27 LAB — URINE CULTURE: Organism ID, Bacteria: 10000

## 2015-09-28 ENCOUNTER — Telehealth: Payer: Self-pay | Admitting: *Deleted

## 2015-09-28 MED ORDER — HYDROCODONE-ACETAMINOPHEN 5-325 MG PO TABS: 1.0000 | ORAL_TABLET | Freq: Two times a day (BID) | ORAL | 0 refills | Status: DC | PRN

## 2015-09-28 NOTE — Telephone Encounter (Signed)
Patient in office requesting refill on Hydrocodone.   Records indicate prescription refill appropriate.   Last refill 10/04/2015.  Prescription printed with fill date of 10/03/2015.

## 2015-10-24 ENCOUNTER — Encounter: Payer: Self-pay | Admitting: Family Medicine

## 2015-10-31 ENCOUNTER — Encounter: Payer: Self-pay | Admitting: Family Medicine

## 2015-10-31 ENCOUNTER — Ambulatory Visit (INDEPENDENT_AMBULATORY_CARE_PROVIDER_SITE_OTHER): Payer: Self-pay | Admitting: Family Medicine

## 2015-10-31 VITALS — BP 138/80 | HR 82 | Temp 98.0°F | Resp 14 | Ht 65.0 in | Wt 264.0 lb

## 2015-10-31 DIAGNOSIS — F411 Generalized anxiety disorder: Secondary | ICD-10-CM

## 2015-10-31 DIAGNOSIS — Z23 Encounter for immunization: Secondary | ICD-10-CM

## 2015-10-31 DIAGNOSIS — M5136 Other intervertebral disc degeneration, lumbar region: Secondary | ICD-10-CM

## 2015-10-31 DIAGNOSIS — F332 Major depressive disorder, recurrent severe without psychotic features: Secondary | ICD-10-CM

## 2015-10-31 DIAGNOSIS — K219 Gastro-esophageal reflux disease without esophagitis: Secondary | ICD-10-CM

## 2015-10-31 MED ORDER — ESOMEPRAZOLE MAGNESIUM 40 MG PO CPDR: 40.0000 mg | DELAYED_RELEASE_CAPSULE | Freq: Every day | ORAL | 3 refills | Status: DC

## 2015-10-31 MED ORDER — FLUOXETINE HCL 40 MG PO CAPS: 40.0000 mg | ORAL_CAPSULE | Freq: Every day | ORAL | 3 refills | Status: DC

## 2015-10-31 MED ORDER — SUCRALFATE 1 G PO TABS: 1.0000 g | ORAL_TABLET | Freq: Two times a day (BID) | ORAL | 3 refills | Status: DC

## 2015-10-31 MED ORDER — HYDROCODONE-ACETAMINOPHEN 5-325 MG PO TABS: 1.0000 | ORAL_TABLET | Freq: Two times a day (BID) | ORAL | 0 refills | Status: DC | PRN
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
## 2015-10-31 NOTE — Patient Instructions (Addendum)
Prozac increased to 40mg - okay to take 2 tablets of the 20mg  Referral to therapy  Carafate refilled take twice a day MRI to be rescheduled  F/U 4 months

## 2015-10-31 NOTE — Assessment & Plan Note (Signed)
Given nexium samples for now

## 2015-10-31 NOTE — Assessment & Plan Note (Signed)
Plan for MRI of lumbar spine in about 1 month and referral to pain clinic

## 2015-10-31 NOTE — Assessment & Plan Note (Signed)
Increase prozac to 40mg  once a day, referral for therapy, no SI , She is to call for any changes Her mood in her pain always improve when she is employed hopefully she will be back at her old job in a couple of weeks

## 2015-11-24 ENCOUNTER — Encounter (HOSPITAL_COMMUNITY): Payer: Self-pay

## 2015-11-24 ENCOUNTER — Emergency Department (HOSPITAL_COMMUNITY)
Admission: EM | Admit: 2015-11-24 | Discharge: 2015-11-24 | Disposition: A | Discharge status: Home / Self Care | Payer: Self-pay | Attending: Emergency Medicine | Admitting: Emergency Medicine

## 2015-11-24 DIAGNOSIS — Z7982 Long term (current) use of aspirin: Secondary | ICD-10-CM | POA: Insufficient documentation

## 2015-11-24 DIAGNOSIS — R42 Dizziness and giddiness: Secondary | ICD-10-CM | POA: Insufficient documentation

## 2015-11-24 DIAGNOSIS — R112 Nausea with vomiting, unspecified: Secondary | ICD-10-CM | POA: Insufficient documentation

## 2015-11-24 DIAGNOSIS — Z79899 Other long term (current) drug therapy: Secondary | ICD-10-CM | POA: Insufficient documentation

## 2015-11-24 DIAGNOSIS — N183 Chronic kidney disease, stage 3 (moderate): Secondary | ICD-10-CM | POA: Insufficient documentation

## 2015-11-24 DIAGNOSIS — R0602 Shortness of breath: Secondary | ICD-10-CM | POA: Insufficient documentation

## 2015-11-24 DIAGNOSIS — R410 Disorientation, unspecified: Secondary | ICD-10-CM | POA: Insufficient documentation

## 2015-11-24 HISTORY — DX: Chronic kidney disease, stage 3 unspecified: N18.30

## 2015-11-24 HISTORY — DX: Chronic kidney disease, stage 3 (moderate): N18.3

## 2015-11-24 LAB — CBC WITH DIFFERENTIAL/PLATELET
BASOS ABS: 0 10*3/uL (ref 0.0–0.1)
Basophils Relative: 1 %
Eosinophils Absolute: 0.1 10*3/uL (ref 0.0–0.7)
Eosinophils Relative: 2 %
HEMATOCRIT: 34 % — AB (ref 36.0–46.0)
HEMOGLOBIN: 10.7 g/dL — AB (ref 12.0–15.0)
LYMPHS PCT: 35 %
Lymphs Abs: 1.9 10*3/uL (ref 0.7–4.0)
MCH: 25.2 pg — ABNORMAL LOW (ref 26.0–34.0)
MCHC: 31.5 g/dL (ref 30.0–36.0)
MCV: 80 fL (ref 78.0–100.0)
MONO ABS: 0.6 10*3/uL (ref 0.1–1.0)
Monocytes Relative: 11 %
NEUTROS ABS: 2.8 10*3/uL (ref 1.7–7.7)
NEUTROS PCT: 52 %
Platelets: 258 10*3/uL (ref 150–400)
RBC: 4.25 MIL/uL (ref 3.87–5.11)
RDW: 17 % — AB (ref 11.5–15.5)
WBC: 5.4 10*3/uL (ref 4.0–10.5)

## 2015-11-24 LAB — COMPREHENSIVE METABOLIC PANEL
ALT: 9 U/L — ABNORMAL LOW (ref 14–54)
AST: 14 U/L — AB (ref 15–41)
Albumin: 2.9 g/dL — ABNORMAL LOW (ref 3.5–5.0)
Alkaline Phosphatase: 54 U/L (ref 38–126)
Anion gap: 4 — ABNORMAL LOW (ref 5–15)
BUN: 13 mg/dL (ref 6–20)
CHLORIDE: 107 mmol/L (ref 101–111)
CO2: 26 mmol/L (ref 22–32)
Calcium: 7.8 mg/dL — ABNORMAL LOW (ref 8.9–10.3)
Creatinine, Ser: 1.11 mg/dL — ABNORMAL HIGH (ref 0.44–1.00)
GFR, EST NON AFRICAN AMERICAN: 58 mL/min — AB (ref >60)
Glucose, Bld: 92 mg/dL (ref 65–99)
POTASSIUM: 3.6 mmol/L (ref 3.5–5.1)
Sodium: 137 mmol/L (ref 135–145)
Total Bilirubin: 0.3 mg/dL (ref 0.3–1.2)
Total Protein: 6.1 g/dL — ABNORMAL LOW (ref 6.5–8.1)

## 2015-11-24 LAB — URINALYSIS, ROUTINE W REFLEX MICROSCOPIC
Bilirubin Urine: NEGATIVE
GLUCOSE, UA: NEGATIVE mg/dL
Hgb urine dipstick: NEGATIVE
KETONES UR: NEGATIVE mg/dL
LEUKOCYTES UA: NEGATIVE
Nitrite: NEGATIVE
PH: 6.5 (ref 5.0–8.0)
Protein, ur: NEGATIVE mg/dL
SPECIFIC GRAVITY, URINE: 1.01 (ref 1.005–1.030)

## 2015-11-24 LAB — LIPASE, BLOOD: LIPASE: 26 U/L (ref 11–51)

## 2015-11-24 MED ORDER — PANTOPRAZOLE SODIUM 20 MG PO TBEC: 20.0000 mg | DELAYED_RELEASE_TABLET | Freq: Every day | ORAL | 0 refills | Status: DC

## 2015-11-24 MED ORDER — SODIUM CHLORIDE 0.9 % IV SOLN: INTRAVENOUS | Status: DC

## 2015-11-24 MED ORDER — ONDANSETRON 4 MG PO TBDP: 4.0000 mg | ORAL_TABLET | Freq: Three times a day (TID) | ORAL | 0 refills | Status: DC | PRN

## 2015-11-24 MED ORDER — SODIUM CHLORIDE 0.9 % IV BOLUS (SEPSIS)
1000.0000 mL | Freq: Once | INTRAVENOUS | Status: AC
  Administered 2015-11-24: 1000 mL via INTRAVENOUS

## 2015-11-24 MED ORDER — PROCHLORPERAZINE EDISYLATE 5 MG/ML IJ SOLN
10.0000 mg | Freq: Once | INTRAMUSCULAR | Status: AC
  Administered 2015-11-24: 10 mg via INTRAVENOUS
  Filled 2015-11-24: qty 2

## 2015-11-24 NOTE — ED Notes (Signed)
Pt requested IV in left AC-states she bruises if it is placed anywhere else.

## 2015-11-24 NOTE — ED Triage Notes (Signed)
Pt reports nausea and vomiting that started last night. Had difficulty catching breath. States she is dizzy at this time. Vomited 4 times during the night per pt. CBG 140

## 2015-11-24 NOTE — Discharge Instructions (Signed)
As discussed, your evaluation today has been largely reassuring.  But, it is important that you monitor your condition carefully, and do not hesitate to return to the ED if you develop new, or concerning changes in your condition. ? ?Otherwise, please follow-up with your physician for appropriate ongoing care. ? ?

## 2015-11-24 NOTE — ED Provider Notes (Signed)
[facility] DEPT Provider Note   CSN: [id number] Arrival date & time: 11/24/15  1234  By signing my name below, I, Dolores Hoose, attest that this documentation has been prepared under the direction and in the presence of Carmin Muskrat, MD . Electronically Signed: Dolores Hoose, Scribe. 11/24/2015. 12:42 PM.  History   Chief Complaint Chief Complaint  Patient presents with  . Emesis  . Shortness of Breath   The history is provided by the patient. No language interpreter was used.   HPI Comments:  Kristi Martin is a 45 y.o. female with PMHx of kidney disease who presents to the Emergency Department complaining of sudden-onset constant unchanged vomiting beginning last night. When asked to quantify her emesis, she states she vomited 4 times.  No modifying factors indicated. She reports associated dizziness, confusion, urinary frequency, and SOB. Pt denies any fever. She also denies any new diet changes, new medicines, or new activity.   Past Medical History:  Diagnosis Date  . Anemia   . Cancer ([place])    cervical cx at age 71  . Cervical cancer ([place])   . Chronic back pain   . Chronic kidney disease   . CKD (chronic kidney disease), symptom management only, stage 3 (moderate)   . DDD (degenerative disc disease), lumbar    Facet arthritis  . Hypotension   . Left knee pain   . Migraine   . Peripheral neuropathy ([place])   . Sciatica   . Vaginal Pap smear, abnormal     Patient Active Problem List   Diagnosis Date Noted  . MDD (major depressive disorder) 06/08/2015  . GAD (generalized anxiety disorder) 02/07/2015  . DDD (degenerative disc disease), lumbar 10/10/2014  . Facet arthropathy, lumbar 10/10/2014  . HGSIL (high grade squamous intraepithelial lesion) on Pap smear of cervix 08/04/2014  . Chronic back pain 07/27/2014  . GERD (gastroesophageal reflux disease) 07/27/2014  . Peripheral neuropathy ([place]) 06/28/2014  . Obesity 06/28/2014  . Glucose intolerance  (impaired glucose tolerance) 04/03/2014  . Chronic anemia 04/01/2014  . CKD (chronic kidney disease) stage 3, GFR 30-59 ml/min 04/01/2014    Past Surgical History:  Procedure Laterality Date  . CHOLECYSTECTOMY N/A 01/20/2013   Procedure: LAPAROSCOPIC CHOLECYSTECTOMY WITH INTRAOPERATIVE CHOLANGIOGRAM;  Surgeon: Shann Medal, MD;  Location: [facility] ORS;  Service: General;  Laterality: N/A;  . TUBAL LIGATION      OB History    Gravida Para Term Preterm AB Living   9 7 5 2 2 7    SAB TAB Ectopic Multiple Live Births   2       1       Home Medications    Prior to Admission medications   Medication Sig Start Date End Date Taking? Authorizing Provider  albuterol (PROVENTIL HFA;VENTOLIN HFA) 108 (90 Base) MCG/ACT inhaler Inhale 2 puffs into the lungs every 4 (four) hours as needed for wheezing or shortness of breath. 04/30/15   Alycia Rossetti, MD  aspirin EC 81 MG tablet Take 81 mg by mouth daily.    Historical Provider, MD  esomeprazole (NEXIUM) 40 MG capsule Take 1 capsule (40 mg total) by mouth daily at 12 noon. 10/31/15   Alycia Rossetti, MD  FLUoxetine (PROZAC) 40 MG capsule Take 1 capsule (40 mg total) by mouth daily. 10/31/15   Alycia Rossetti, MD  gabapentin (NEURONTIN) 300 MG capsule Take 1 capsule twice a day and 2 at bedtime Patient taking differently: Take 300-600 mg by mouth 2 (two) times daily  as needed (pain). Take 1 capsule twice a day and 2 at bedtime 06/08/15   Alycia Rossetti, MD  HYDROcodone-acetaminophen (NORCO/VICODIN) 5-325 MG tablet Take 1-2 tablets by mouth 2 (two) times daily as needed for moderate pain. 10/31/15   Alycia Rossetti, MD  methocarbamol (ROBAXIN) 500 MG tablet Take 1 tablet (500 mg total) by mouth 2 (two) times daily as needed for muscle spasms. 10/18/14   Alycia Rossetti, MD  naproxen (NAPROSYN) 500 MG tablet Take 1 tablet (500 mg total) by mouth 2 (two) times daily with a meal. 07/05/15   Noemi Chapel, MD  ondansetron (ZOFRAN ODT) 4 MG disintegrating  tablet Take 1 tablet (4 mg total) by mouth every 8 (eight) hours as needed for nausea or vomiting. 09/06/14   Alycia Rossetti, MD  pantoprazole (PROTONIX) 40 MG tablet Take 1 tablet (40 mg total) by mouth 2 (two) times daily. 06/08/15   Alycia Rossetti, MD  sucralfate (CARAFATE) 1 g tablet Take 1 tablet (1 g total) by mouth 2 (two) times daily. 10/31/15   Alycia Rossetti, MD    Family History Family History  Problem Relation Age of Onset  . Cancer Mother   . Colon cancer Mother   . Breast cancer Maternal Grandmother   . Heart disease Maternal Grandmother   . Breast cancer Paternal Grandmother   . Heart disease Paternal Grandmother   . Hypertension Paternal Grandmother   . Alcohol abuse Father   . Drug abuse Father   . Asthma Brother   . Cancer Paternal Grandfather     Social History Social History  Substance Use Topics  . Smoking status: Never Smoker  . Smokeless tobacco: Never Used  . Alcohol use No     Allergies   Benadryl [diphenhydramine hcl] and Onion   Review of Systems Review of Systems  Constitutional: Negative for fever.       Per HPI, otherwise negative  HENT:       Per HPI, otherwise negative  Respiratory: Positive for shortness of breath.        Per HPI, otherwise negative  Cardiovascular:       Per HPI, otherwise negative  Gastrointestinal: Positive for nausea and vomiting.  Endocrine:       Negative aside from HPI  Genitourinary: Positive for frequency.       Neg aside from HPI   Musculoskeletal:       Per HPI, otherwise negative  Skin: Negative.   Neurological: Positive for dizziness. Negative for syncope.  Psychiatric/Behavioral: Positive for confusion.  All other systems reviewed and are negative.    Physical Exam Updated Vital Signs BP 107/67 (BP Location: Left Arm)   Pulse 75   Temp 97.7 F (36.5 C) (Oral)   Resp 17   Ht 5\' 4"  (1.626 m)   Wt 263 lb (119.3 kg)   SpO2 98%   BMI 45.14 kg/m   Physical Exam  Constitutional: She is  oriented to person, place, and time. She appears well-developed and well-nourished. No distress.  HENT:  Head: Normocephalic and atraumatic.  Eyes: Conjunctivae and EOM are normal.  Cardiovascular: Normal rate and regular rhythm.   Pulmonary/Chest: Effort normal and breath sounds normal. No stridor. No respiratory distress.  Abdominal: She exhibits no distension.  Musculoskeletal: She exhibits no edema.  Neurological: She is alert and oriented to person, place, and time. No cranial nerve deficit.  Skin: Skin is warm and dry.  Psychiatric: She has a normal mood and affect.  Nursing note and vitals reviewed.    ED Treatments / Results  DIAGNOSTIC STUDIES:  Oxygen Saturation is 98% on RA, normal by my interpretation.    COORDINATION OF CARE:  12:47 PM Discussed treatment plan with pt at bedside which includes labwork and pt agreed to plan.  Labs (all labs ordered are listed, but only abnormal results are displayed) Labs Reviewed  COMPREHENSIVE METABOLIC PANEL - Abnormal; Notable for the following:       Result Value   Creatinine, Ser 1.11 (*)    Calcium 7.8 (*)    Total Protein 6.1 (*)    Albumin 2.9 (*)    AST 14 (*)    ALT 9 (*)    GFR calc non Af Amer 58 (*)    Anion gap 4 (*)    All other components within normal limits  CBC WITH DIFFERENTIAL/PLATELET - Abnormal; Notable for the following:    Hemoglobin 10.7 (*)    HCT 34.0 (*)    MCH 25.2 (*)    RDW 17.0 (*)    All other components within normal limits  LIPASE, BLOOD  URINALYSIS, ROUTINE W REFLEX MICROSCOPIC (NOT AT [facility])  I-STAT BETA HCG BLOOD, ED ([facility], [facility], [facility] ONLY)    EKG  EKG Interpretation  Date/Time:  Saturday November 24 2015 12:40:24 EDT Ventricular Rate:  75 PR Interval:    QRS Duration: 71 QT Interval:  378 QTC Calculation: 423 R Axis:   48 Text Interpretation:  Sinus rhythm T wave abnormality Artifact Abnormal ekg Confirmed by Carmin Muskrat  MD [phone]) on 11/24/2015 1:04:16 PM        Procedures Procedures (including critical care time)  Medications Ordered in ED Medications  sodium chloride 0.9 % bolus 1,000 mL (1,000 mLs Intravenous New Bag/Given 11/24/15 1251)    And  0.9 %  sodium chloride infusion (not administered)  prochlorperazine (COMPAZINE) injection 10 mg (10 mg Intravenous Given 11/24/15 1251)     Initial Impression / Assessment and Plan / ED Course  I have reviewed the triage vital signs and the nursing notes.  Pertinent labs & imaging results that were available during my care of the patient were reviewed by me and considered in my medical decision making (see chart for details).  Clinical Course    3:41 PM Substantially better, no additional vomiting, states that her symptoms have resolved. She denies any ongoing dyspnea. Reviewed all findings at length, suspicion for gastric etiology. Patient will start additional medication, follow-up with primary care.  Final Clinical Impressions(s) / ED Diagnoses   Final diagnoses:  Nausea and vomiting, intractability of vomiting not specified, unspecified vomiting type   Chart review notable for multiple evaluations for similar concerns, including CT abdomen pelvis, 6, over the past 2 years.  On repeat exam after the patient received fluids, Compazine, she has no ongoing complaints, states that she feels better. This female presenting with nausea, vomiting, nonspecific abdominal pain has no evidence for peritonitis, bacteremia, sepsis, acute abdominal processes, pancreatitis. Suspicion for acute on chronic process, versus gastric etiology. With resolution of symptoms, she is appropriate for further evaluation, management as an outpatient.  New Prescriptions New Prescriptions   ONDANSETRON (ZOFRAN ODT) 4 MG DISINTEGRATING TABLET    Take 1 tablet (4 mg total) by mouth every 8 (eight) hours as needed for nausea or vomiting.   I personally performed the services described in this documentation,  which was scribed in my presence. The recorded information has been reviewed and is accurate.  Carmin Muskrat, MD 11/24/15 [phone]

## 2015-11-28 ENCOUNTER — Telehealth: Payer: Self-pay | Admitting: *Deleted

## 2015-11-28 NOTE — Telephone Encounter (Signed)
-----   Message from Alycia Rossetti, MD sent at 11/28/2015  1:29 PM EDT ----- Regarding: FW: schedule MRI Call pt see if this is a good time to schedule the MRI of her spine ----- Message ----- From: Alycia Rossetti, MD Sent: 11/28/2015 To: Alycia Rossetti, MD Subject: schedule MRI

## 2015-11-28 NOTE — Telephone Encounter (Signed)
Call placed to patient. LMTRC.  

## 2015-11-29 ENCOUNTER — Telehealth: Payer: Self-pay | Admitting: *Deleted

## 2015-11-29 NOTE — Telephone Encounter (Signed)
Received return call from patient.   Reports that she still does not have insurance. Inquired as to out of pocket cost.   Call placed to [place].   Was advised that they cover 60% of scan if full amount is paid up front. Scan will cost $1,153 with discount. Also advised that if payment arrangements are required, plan can be set up for up to 24 month with $25 up front and minimum monthly payment of $25.   Call placed to patient at [phone]. [place].

## 2015-11-29 NOTE — Telephone Encounter (Addendum)
[place] [phone].  [place] [phone]- [phone].  LM for MRI pre-cert to inquire.

## 2015-11-29 NOTE — Telephone Encounter (Signed)
Received call from patient.   Requested refill on Hydrocodone.   Ok to refill??  Last office visit/ refill 10/31/2015.

## 2015-11-30 MED ORDER — HYDROCODONE-ACETAMINOPHEN 5-325 MG PO TABS: 1.0000 | ORAL_TABLET | Freq: Two times a day (BID) | ORAL | 0 refills | Status: DC | PRN

## 2015-11-30 NOTE — Telephone Encounter (Signed)
noted 

## 2015-11-30 NOTE — Telephone Encounter (Signed)
okay

## 2015-11-30 NOTE — Telephone Encounter (Signed)
Patient in office to discuss MRI.   Advised that I have not heard from [facility] in regards to cost of scan, but patient made aware of [facility] quote.   Of note, patient states that she cannot tolerate closed machine and will need open MRI. Open MRI is only at [facility].   Patient states that she is in talks with insurance rep about getting affordable medical insurance. States that she will call on Monday to discuss if she qualifies for insurance and then will let our office know when she become insured.   Reports that she does not wish to proceed with MRI in light of out of pocket costs.   MD to be made aware.

## 2015-11-30 NOTE — Telephone Encounter (Signed)
Prescription printed and patient made aware to come to office to pick up after 2pm on 11/30/2015.

## 2015-12-03 NOTE — Telephone Encounter (Signed)
Received return call from [place] at [facility].   States that MRI has out of picket discount, but will still cost $1450.35. States that billing dept can negotiate payment plan or financial assistance. [phone]~ telephone.

## 2015-12-31 ENCOUNTER — Encounter: Payer: Self-pay | Admitting: Family Medicine

## 2015-12-31 ENCOUNTER — Ambulatory Visit (INDEPENDENT_AMBULATORY_CARE_PROVIDER_SITE_OTHER): Payer: Self-pay | Admitting: Family Medicine

## 2015-12-31 VITALS — BP 136/72 | HR 90 | Temp 98.1°F | Resp 16 | Ht 65.0 in | Wt 266.0 lb

## 2015-12-31 DIAGNOSIS — M5136 Other intervertebral disc degeneration, lumbar region: Secondary | ICD-10-CM

## 2015-12-31 DIAGNOSIS — L729 Follicular cyst of the skin and subcutaneous tissue, unspecified: Secondary | ICD-10-CM

## 2015-12-31 DIAGNOSIS — J069 Acute upper respiratory infection, unspecified: Secondary | ICD-10-CM

## 2015-12-31 MED ORDER — PREDNISONE 10 MG PO TABS: ORAL_TABLET | ORAL | 0 refills | Status: DC

## 2015-12-31 MED ORDER — HYDROCODONE-ACETAMINOPHEN 5-325 MG PO TABS: 1.0000 | ORAL_TABLET | Freq: Two times a day (BID) | ORAL | 0 refills | Status: DC | PRN

## 2015-12-31 NOTE — Progress Notes (Signed)
   Subjective:    Patient ID: Kristi Martin, female    DOB: [birth year], 45 y.o.   MRN: [id number]  Patient presents for Frequent Headaches (reports increased headaches and tender area to R scalp- states that she has frequent HA to R side) and Cough (nonproductive cough)   Patient here with cough nonproductive body aches and runny nose sneezing for the past 5 days. She's also had some wheezing at night time. She's been taking over-the-counter cough medicine which helped. She is uninsured. She's not had any fever.  He also complains of headache on the right side he comes from a sore spot in her scalp which seems to gotten larger. States she's had a knot in her scalp for some time now now is very sore when she tries to comb her hair. She'll like to have this evaluated.   Review Of Systems:  GEN- denies fatigue, fever, weight loss,weakness, recent illness HEENT- denies eye drainage, change in vision, nasal discharge, CVS- denies chest pain, palpitations RESP- denies SOB, cough, wheeze ABD- denies N/V, change in stools, abd pain GU- denies dysuria, hematuria, dribbling, incontinence MSK- denies joint pain, muscle aches, injury Neuro- + headache, dizziness, syncope, seizure activity       Objective:    BP 136/72 (BP Location: Left Arm, Patient Position: Sitting, Cuff Size: Large)   Pulse 90   Temp 98.1 F (36.7 C) (Oral)   Resp 16   Ht 5\' 5"  (1.651 m)   Wt 266 lb (120.7 kg)   SpO2 99%   BMI 44.26 kg/m  GEN- NAD, alert and oriented x3 HEENT- PERRL, EOMI, non injected sclera, pink conjunctiva, MMM, oropharynx clear  TM clear bilat no effusion, no  maxillary sinus tenderness,+ Nasal drainage  Neck- Supple, no LAD Skin- RIGHT parietal area- small swelling in scalp , mild TTP , no erythema  CVS- RRR, no murmur RESP-CTAB EXT- No edema Pulses- Radial 2+         Assessment & Plan:      Problem List Items Addressed This Visit    DDD (degenerative disc disease), lumbar   Relevant Medications   HYDROcodone-acetaminophen (NORCO/VICODIN) 5-325 MG tablet   predniSONE (DELTASONE) 10 MG tablet    Other Visit Diagnoses    Scalp cyst    -  Primary   Small localized swelling, ? cyst, refer to dermatology, no sign of infection   Relevant Orders   Ambulatory referral to Dermatology   Acute URI       URI, given prednisone to help with mild bronchospasm, can not afford inhaler, OTC cough medicine, chronic pain meds refilled       Note: This dictation was prepared with Dragon dictation along with smaller phrase technology. Any transcriptional errors that result from this process are unintentional.

## 2015-12-31 NOTE — Patient Instructions (Addendum)
Referral to dermatology Take prednisone Use cough medicine F/U as previous

## 2016-01-22 IMAGING — CT CT ANGIO CHEST
1 of 6 series · 5 of 36 positions shown · IV contrast (Omnipaque 300)
Comparison: CT chest 12/07/2013

CLINICAL DATA: Right-sided and central sharp constant chest pain
starting today. Patient was seen in the ED 3 days ago with right
flank pain. Still with right-sided pain.

EXAM:
CT ANGIOGRAPHY CHEST WITH CONTRAST
TECHNIQUE: Multidetector CT imaging of the chest was performed using the
standard protocol during bolus administration of intravenous
contrast. Multiplanar CT image reconstructions and MIPs were
obtained to evaluate the vascular anatomy.
CONTRAST:  100mL OMNIPAQUE IOHEXOL 350 MG/ML SOLN

[Series 5: pe 3.0 b40f · axial · 0.66mm/px · z∈[+934,+1110]mm · 5 of 89 slices shown]
[im 15/89  lung]
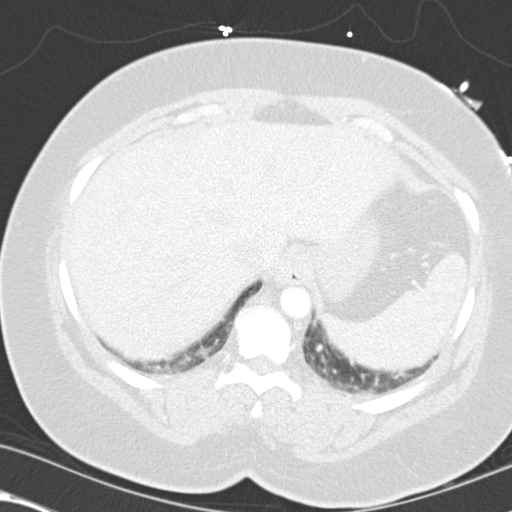
[im 30/89  mediastinal]
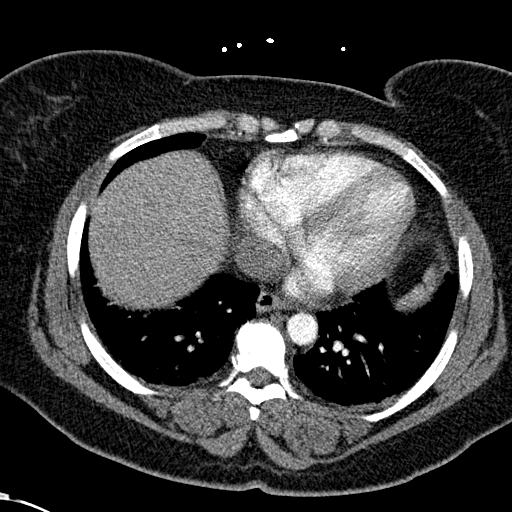
[im 45/89  lung]
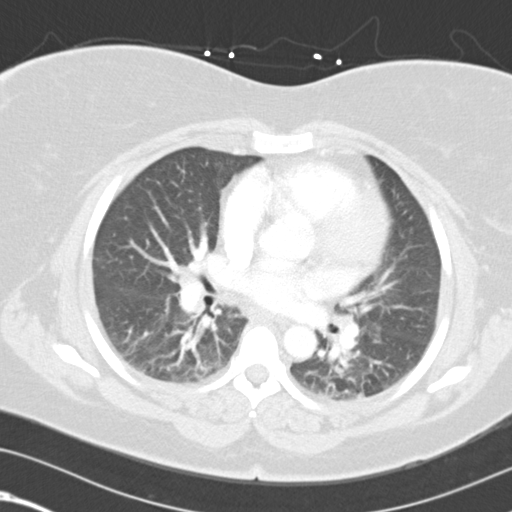
[im 59/89  mediastinal]
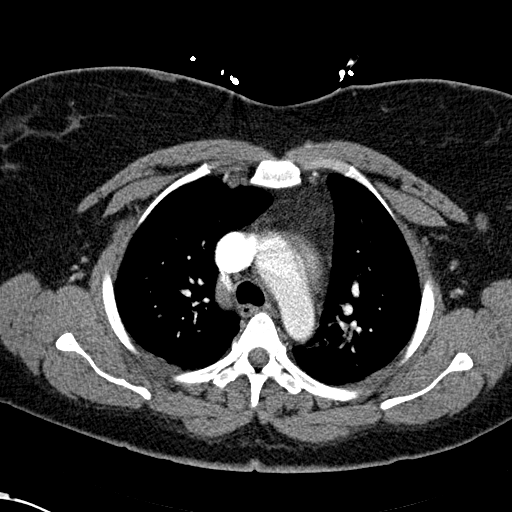
[im 74/89  lung]
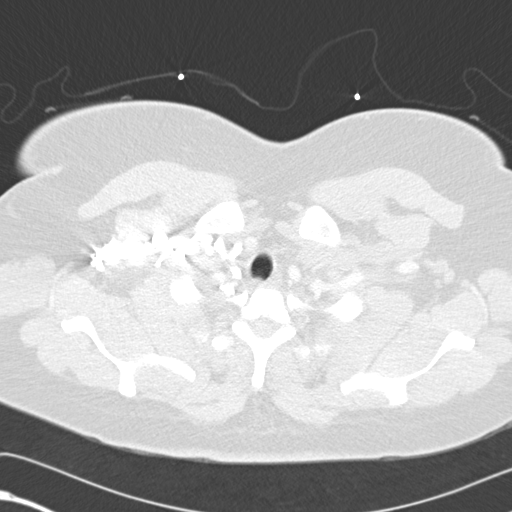

[5 of 36 positions shown; findings below may reference images not displayed]

FINDINGS: Examination is technically limited due to motion artifact.
Moderately good opacification of the central and proximal segmental
pulmonary arteries. No focal filling defects. No evidence of
significant central pulmonary embolus. More peripheral vessels are
not well visualized.

Normal heart size. Normal caliber thoracic aorta. Accounting for
motion artifact, there is no evidence of significant dissection.
Great vessel origins are patent. Note of common origin of the
carotid arteries. Esophagus is decompressed. No significant
lymphadenopathy in the chest.

Evaluation of lungs is limited due to respiratory motion artifact.
There is evidence of atelectasis or infiltration in the lung bases.
No focal consolidation. No pneumothorax. No pleural effusions.
Airways appear patent.

Included portions of the upper abdominal organs are grossly
unremarkable. Degenerative changes in the spine. No destructive bone
lesions.

Review of the MIP images confirms the above findings.
IMPRESSION: No evidence of significant pulmonary embolus. Atelectasis or
infiltration in the lung bases.

## 2016-01-29 ENCOUNTER — Emergency Department (HOSPITAL_COMMUNITY)
Admission: EM | Admit: 2016-01-29 | Discharge: 2016-01-29 | Disposition: A | Discharge status: Home / Self Care | Payer: Self-pay | Attending: Emergency Medicine | Admitting: Emergency Medicine

## 2016-01-29 ENCOUNTER — Encounter (HOSPITAL_COMMUNITY): Payer: Self-pay | Admitting: Emergency Medicine

## 2016-01-29 DIAGNOSIS — Z79899 Other long term (current) drug therapy: Secondary | ICD-10-CM | POA: Insufficient documentation

## 2016-01-29 DIAGNOSIS — M549 Dorsalgia, unspecified: Secondary | ICD-10-CM | POA: Insufficient documentation

## 2016-01-29 DIAGNOSIS — Z7982 Long term (current) use of aspirin: Secondary | ICD-10-CM | POA: Insufficient documentation

## 2016-01-29 DIAGNOSIS — K029 Dental caries, unspecified: Secondary | ICD-10-CM

## 2016-01-29 DIAGNOSIS — N183 Chronic kidney disease, stage 3 (moderate): Secondary | ICD-10-CM | POA: Insufficient documentation

## 2016-01-29 DIAGNOSIS — Z8541 Personal history of malignant neoplasm of cervix uteri: Secondary | ICD-10-CM | POA: Insufficient documentation

## 2016-01-29 MED ORDER — CLINDAMYCIN HCL 150 MG PO CAPS
300.0000 mg | ORAL_CAPSULE | Freq: Once | ORAL | Status: AC
  Administered 2016-01-29: 300 mg via ORAL
  Filled 2016-01-29: qty 2

## 2016-01-29 MED ORDER — PROMETHAZINE HCL 12.5 MG PO TABS
25.0000 mg | ORAL_TABLET | Freq: Once | ORAL | Status: AC
  Administered 2016-01-29: 25 mg via ORAL
  Filled 2016-01-29: qty 2

## 2016-01-29 MED ORDER — HYDROCODONE-ACETAMINOPHEN 5-325 MG PO TABS
2.0000 | ORAL_TABLET | Freq: Once | ORAL | Status: AC
  Administered 2016-01-29: 2 via ORAL
  Filled 2016-01-29: qty 2

## 2016-01-29 MED ORDER — AMOXICILLIN 500 MG PO CAPS: 500.0000 mg | ORAL_CAPSULE | Freq: Three times a day (TID) | ORAL | 0 refills | Status: DC

## 2016-01-29 MED ORDER — IBUPROFEN 800 MG PO TABS
800.0000 mg | ORAL_TABLET | Freq: Once | ORAL | Status: AC
  Administered 2016-01-29: 800 mg via ORAL
  Filled 2016-01-29: qty 1

## 2016-01-29 NOTE — ED Triage Notes (Signed)
Pt reports R sided dental pain that started last night.

## 2016-01-29 NOTE — Discharge Instructions (Signed)
Please use Amoxil 3 times daily until all taken. Please see your dentist as soon as possible. See Dr. Buelah Manis for assistance with your other medications.

## 2016-01-29 NOTE — ED Provider Notes (Signed)
[facility] DEPT Provider Note   CSN: [id number] Arrival date & time: 01/29/16  1244     History   Chief Complaint Chief Complaint  Patient presents with  . Dental Pain    HPI Kristi Martin is a 45 y.o. female.  The history is provided by the patient.  Dental Pain   This is a new problem. The current episode started yesterday. The problem occurs daily. The problem has been gradually worsening. The pain is moderate. She has tried nothing for the symptoms. The treatment provided no relief.    Past Medical History:  Diagnosis Date  . Anemia   . Cancer ([place])    cervical cx at age 40  . Cervical cancer ([place])   . Chronic back pain   . Chronic kidney disease   . CKD (chronic kidney disease), symptom management only, stage 3 (moderate)   . DDD (degenerative disc disease), lumbar    Facet arthritis  . Hypotension   . Left knee pain   . Migraine   . Peripheral neuropathy ([place])   . Sciatica   . Vaginal Pap smear, abnormal     Patient Active Problem List   Diagnosis Date Noted  . MDD (major depressive disorder) 06/08/2015  . GAD (generalized anxiety disorder) 02/07/2015  . DDD (degenerative disc disease), lumbar 10/10/2014  . Facet arthropathy, lumbar 10/10/2014  . HGSIL (high grade squamous intraepithelial lesion) on Pap smear of cervix 08/04/2014  . Chronic back pain 07/27/2014  . GERD (gastroesophageal reflux disease) 07/27/2014  . Peripheral neuropathy ([place]) 06/28/2014  . Obesity 06/28/2014  . Glucose intolerance (impaired glucose tolerance) 04/03/2014  . Chronic anemia 04/01/2014  . CKD (chronic kidney disease) stage 3, GFR 30-59 ml/min 04/01/2014    Past Surgical History:  Procedure Laterality Date  . CHOLECYSTECTOMY N/A 01/20/2013   Procedure: LAPAROSCOPIC CHOLECYSTECTOMY WITH INTRAOPERATIVE CHOLANGIOGRAM;  Surgeon: Shann Medal, MD;  Location: [facility] ORS;  Service: General;  Laterality: N/A;  . TUBAL LIGATION      OB History    Gravida Para Term  Preterm AB Living   9 7 5 2 2 7    SAB TAB Ectopic Multiple Live Births   2       1       Home Medications    Prior to Admission medications   Medication Sig Start Date End Date Taking? Authorizing Provider  aspirin EC 81 MG tablet Take 81 mg by mouth daily.   Yes Historical Provider, MD  esomeprazole (NEXIUM) 40 MG capsule Take 1 capsule (40 mg total) by mouth daily at 12 noon. 10/31/15  Yes Alycia Rossetti, MD  FLUoxetine (PROZAC) 40 MG capsule Take 1 capsule (40 mg total) by mouth daily. 10/31/15  Yes Alycia Rossetti, MD  ondansetron (ZOFRAN ODT) 4 MG disintegrating tablet Take 1 tablet (4 mg total) by mouth every 8 (eight) hours as needed for nausea or vomiting. 11/24/15  Yes Carmin Muskrat, MD  pantoprazole (PROTONIX) 20 MG tablet Take 1 tablet (20 mg total) by mouth daily. 11/24/15 01/29/16 Yes Carmin Muskrat, MD  sucralfate (CARAFATE) 1 g tablet Take 1 tablet (1 g total) by mouth 2 (two) times daily. 10/31/15  Yes Alycia Rossetti, MD  HYDROcodone-acetaminophen (NORCO/VICODIN) 5-325 MG tablet Take 1-2 tablets by mouth 2 (two) times daily as needed for moderate pain. 12/31/15   Alycia Rossetti, MD  methocarbamol (ROBAXIN) 500 MG tablet Take 1 tablet (500 mg total) by mouth 2 (two) times daily as needed for muscle  spasms. 10/18/14   Alycia Rossetti, MD  predniSONE (DELTASONE) 10 MG tablet Take 40mg  x 5 days 12/31/15   Alycia Rossetti, MD    Family History Family History  Problem Relation Age of Onset  . Cancer Mother   . Colon cancer Mother   . Breast cancer Maternal Grandmother   . Heart disease Maternal Grandmother   . Breast cancer Paternal Grandmother   . Heart disease Paternal Grandmother   . Hypertension Paternal Grandmother   . Alcohol abuse Father   . Drug abuse Father   . Asthma Brother   . Cancer Paternal Grandfather     Social History Social History  Substance Use Topics  . Smoking status: Never Smoker  . Smokeless tobacco: Never Used  . Alcohol use No      Allergies   Benadryl [diphenhydramine hcl] and Onion   Review of Systems Review of Systems  Constitutional: Negative for activity change.       All ROS Neg except as noted in HPI  HENT: Positive for dental problem. Negative for nosebleeds.   Eyes: Negative for photophobia and discharge.  Respiratory: Negative for cough, shortness of breath and wheezing.   Cardiovascular: Negative for chest pain and palpitations.  Gastrointestinal: Negative for abdominal pain and blood in stool.  Genitourinary: Negative for dysuria, frequency and hematuria.  Musculoskeletal: Positive for back pain. Negative for arthralgias and neck pain.  Skin: Negative.   Neurological: Negative for dizziness, seizures and speech difficulty.  Psychiatric/Behavioral: Negative for confusion and hallucinations.     Physical Exam Updated Vital Signs Pulse 79   Temp 98.6 F (37 C) (Tympanic)   Resp 22   LMP 01/04/2016   SpO2 98%   Physical Exam  HENT:  Mouth/Throat: Oropharynx is clear and moist. Abnormal dentition. Dental caries present. No uvula swelling.    Airway patent. No swelling under the tongue.     ED Treatments / Results  Labs (all labs ordered are listed, but only abnormal results are displayed) Labs Reviewed - No data to display  EKG  EKG Interpretation None       Radiology No results found.  Procedures Procedures (including critical care time)  Medications Ordered in ED Medications - No data to display   Initial Impression / Assessment and Plan / ED Course  I have reviewed the triage vital signs and the nursing notes.  Pertinent labs & imaging results that were available during my care of the patient were reviewed by me and considered in my medical decision making (see chart for details).  Clinical Course     **I have reviewed nursing notes, vital signs, and all appropriate lab and imaging results for this patient.*  Final Clinical Impressions(s) / ED  Diagnoses  Vital signs within normal limits. The patient has extensive dental caries, some with decay to the gum line. There is swelling of the gum, but no abscess noted. Is no evidence for Ludwig's angina. Patient treated with Amoxil. Patient strongly advised to see a dentist systems possible. Patient acknowledges understanding of the discharge instructions.    Final diagnoses:  Dental caries    New Prescriptions Discharge Medication List as of 01/29/2016  3:07 PM    START taking these medications   Details  amoxicillin (AMOXIL) 500 MG capsule Take 1 capsule (500 mg total) by mouth 3 (three) times daily., Starting Tue 01/29/2016, Print         Lily Kocher, PA-C 01/31/16 1438    Davonna Belling, MD 02/01/16  1454  

## 2016-01-31 ENCOUNTER — Telehealth: Payer: Self-pay | Admitting: Family Medicine

## 2016-01-31 MED ORDER — HYDROCODONE-ACETAMINOPHEN 5-325 MG PO TABS: 1.0000 | ORAL_TABLET | Freq: Two times a day (BID) | ORAL | 0 refills | Status: DC | PRN

## 2016-01-31 NOTE — Telephone Encounter (Signed)
Pt requesting refill on pain medication - Hydrocodone - per Dr. Dennard Schaumann ok to refill - rx printed and handed to pt in office.

## 2016-02-03 ENCOUNTER — Emergency Department (HOSPITAL_COMMUNITY): Payer: No Typology Code available for payment source

## 2016-02-03 ENCOUNTER — Emergency Department (HOSPITAL_COMMUNITY)
Admission: EM | Admit: 2016-02-03 | Discharge: 2016-02-03 | Disposition: A | Discharge status: Home / Self Care | Payer: No Typology Code available for payment source | Attending: Emergency Medicine | Admitting: Emergency Medicine

## 2016-02-03 ENCOUNTER — Encounter (HOSPITAL_COMMUNITY): Payer: Self-pay | Admitting: *Deleted

## 2016-02-03 DIAGNOSIS — R0789 Other chest pain: Secondary | ICD-10-CM | POA: Diagnosis not present

## 2016-02-03 DIAGNOSIS — R079 Chest pain, unspecified: Secondary | ICD-10-CM

## 2016-02-03 DIAGNOSIS — R51 Headache: Secondary | ICD-10-CM | POA: Insufficient documentation

## 2016-02-03 DIAGNOSIS — R109 Unspecified abdominal pain: Secondary | ICD-10-CM | POA: Insufficient documentation

## 2016-02-03 DIAGNOSIS — Z79899 Other long term (current) drug therapy: Secondary | ICD-10-CM | POA: Insufficient documentation

## 2016-02-03 DIAGNOSIS — Z8541 Personal history of malignant neoplasm of cervix uteri: Secondary | ICD-10-CM | POA: Insufficient documentation

## 2016-02-03 DIAGNOSIS — Y999 Unspecified external cause status: Secondary | ICD-10-CM | POA: Diagnosis not present

## 2016-02-03 DIAGNOSIS — M25551 Pain in right hip: Secondary | ICD-10-CM | POA: Diagnosis not present

## 2016-02-03 DIAGNOSIS — Y92410 Unspecified street and highway as the place of occurrence of the external cause: Secondary | ICD-10-CM | POA: Diagnosis not present

## 2016-02-03 DIAGNOSIS — Z7982 Long term (current) use of aspirin: Secondary | ICD-10-CM | POA: Diagnosis not present

## 2016-02-03 DIAGNOSIS — Y9389 Activity, other specified: Secondary | ICD-10-CM | POA: Insufficient documentation

## 2016-02-03 DIAGNOSIS — N183 Chronic kidney disease, stage 3 (moderate): Secondary | ICD-10-CM | POA: Diagnosis not present

## 2016-02-03 DIAGNOSIS — M542 Cervicalgia: Secondary | ICD-10-CM | POA: Diagnosis not present

## 2016-02-03 DIAGNOSIS — S299XXA Unspecified injury of thorax, initial encounter: Secondary | ICD-10-CM | POA: Diagnosis present

## 2016-02-03 LAB — COMPREHENSIVE METABOLIC PANEL
ALBUMIN: 3 g/dL — AB (ref 3.5–5.0)
ALK PHOS: 57 U/L (ref 38–126)
ALT: 12 U/L — ABNORMAL LOW (ref 14–54)
ANION GAP: 5 (ref 5–15)
AST: 19 U/L (ref 15–41)
BUN: 11 mg/dL (ref 6–20)
CALCIUM: 8 mg/dL — AB (ref 8.9–10.3)
CHLORIDE: 108 mmol/L (ref 101–111)
CO2: 24 mmol/L (ref 22–32)
Creatinine, Ser: 0.95 mg/dL (ref 0.44–1.00)
GFR calc Af Amer: >60 mL/min (ref >60)
GFR calc non Af Amer: >60 mL/min (ref >60)
GLUCOSE: 108 mg/dL — AB (ref 65–99)
POTASSIUM: 3.6 mmol/L (ref 3.5–5.1)
SODIUM: 137 mmol/L (ref 135–145)
Total Bilirubin: 0.2 mg/dL — ABNORMAL LOW (ref 0.3–1.2)
Total Protein: 6.2 g/dL — ABNORMAL LOW (ref 6.5–8.1)

## 2016-02-03 LAB — CBC
HCT: 37.4 % (ref 36.0–46.0)
HEMOGLOBIN: 12 g/dL (ref 12.0–15.0)
MCH: 27.1 pg (ref 26.0–34.0)
MCHC: 32.1 g/dL (ref 30.0–36.0)
MCV: 84.4 fL (ref 78.0–100.0)
PLATELETS: 292 10*3/uL (ref 150–400)
RBC: 4.43 MIL/uL (ref 3.87–5.11)
RDW: 16.7 % — ABNORMAL HIGH (ref 11.5–15.5)
WBC: 5.3 10*3/uL (ref 4.0–10.5)

## 2016-02-03 LAB — LIPASE, BLOOD: Lipase: 28 U/L (ref 11–51)

## 2016-02-03 LAB — I-STAT BETA HCG BLOOD, ED (MC, WL, AP ONLY)

## 2016-02-03 MED ORDER — HYDROMORPHONE HCL 1 MG/ML IJ SOLN
1.0000 mg | Freq: Once | INTRAMUSCULAR | Status: AC
  Administered 2016-02-03: 1 mg via INTRAVENOUS
  Filled 2016-02-03: qty 1

## 2016-02-03 MED ORDER — IOPAMIDOL (ISOVUE-300) INJECTION 61%
100.0000 mL | Freq: Once | INTRAVENOUS | Status: AC | PRN
  Administered 2016-02-03: 100 mL via INTRAVENOUS

## 2016-02-03 MED ORDER — ONDANSETRON HCL 4 MG/2ML IJ SOLN
4.0000 mg | Freq: Once | INTRAMUSCULAR | Status: AC
  Administered 2016-02-03: 4 mg via INTRAVENOUS
  Filled 2016-02-03: qty 2

## 2016-02-03 MED ORDER — CYCLOBENZAPRINE HCL 10 MG PO TABS: 10.0000 mg | ORAL_TABLET | Freq: Three times a day (TID) | ORAL | 0 refills | Status: DC | PRN

## 2016-02-03 MED ORDER — IBUPROFEN 600 MG PO TABS: 600.0000 mg | ORAL_TABLET | Freq: Three times a day (TID) | ORAL | 0 refills | Status: DC | PRN

## 2016-02-03 NOTE — ED Notes (Signed)
Patient transported to CT 

## 2016-02-03 NOTE — ED Notes (Signed)
Pt has c-collar on and aligned by EMS

## 2016-02-03 NOTE — ED Notes (Signed)
Pt moaning out in pain. Dr Venora Maples notified

## 2016-02-03 NOTE — ED Notes (Signed)
Provided pt with grape juice

## 2016-02-03 NOTE — ED Notes (Signed)
Pt states she feels like her face and hands are going numb. Pt anxious and rapidly breathing at this time. Pt encouraged to take slow deep breaths and that the EDP would be with her soon.
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
## 2016-02-03 NOTE — ED Notes (Addendum)
Pt complaining of nausea. Standing order for zofran 4 mg ordered  Dr Venora Maples notified

## 2016-02-25 ENCOUNTER — Telehealth: Payer: Self-pay | Admitting: *Deleted

## 2016-02-25 MED ORDER — HYDROCODONE-ACETAMINOPHEN 5-325 MG PO TABS: 1.0000 | ORAL_TABLET | Freq: Two times a day (BID) | ORAL | 0 refills | Status: DC | PRN

## 2016-02-25 NOTE — Telephone Encounter (Signed)
Received call from patient.   Requested refill on Hydrocodone.   Ok to refill??  Last office visit 12/31/2015.  Last refill 01/31/2016.

## 2016-02-25 NOTE — Telephone Encounter (Signed)
Okay to refill, can not get until the 28th

## 2016-02-25 NOTE — Telephone Encounter (Signed)
Prescription printed and patient made aware to come to office to pick up on 02/29/2016.

## 2016-03-03 ENCOUNTER — Ambulatory Visit: Payer: Self-pay | Admitting: Family Medicine

## 2016-03-31 ENCOUNTER — Telehealth: Payer: Self-pay | Admitting: *Deleted

## 2016-03-31 MED ORDER — HYDROCODONE-ACETAMINOPHEN 5-325 MG PO TABS: 1.0000 | ORAL_TABLET | Freq: Two times a day (BID) | ORAL | 0 refills | Status: DC | PRN

## 2016-03-31 NOTE — Telephone Encounter (Signed)
okay

## 2016-03-31 NOTE — Telephone Encounter (Signed)
Prescription printed and patient made aware to come to office to pick up on 04/01/2016 per VM.

## 2016-03-31 NOTE — Telephone Encounter (Signed)
Received VM from patient.   Requested refill on Hydrocodone.   Ok to refill??  Last office visit 12/31/2015.  Last refill 03/02/2016.

## 2016-04-07 ENCOUNTER — Ambulatory Visit: Payer: Self-pay | Admitting: Family Medicine

## 2016-04-10 ENCOUNTER — Ambulatory Visit (INDEPENDENT_AMBULATORY_CARE_PROVIDER_SITE_OTHER): Payer: Self-pay | Admitting: Physician Assistant

## 2016-04-10 ENCOUNTER — Encounter: Payer: Self-pay | Admitting: Physician Assistant

## 2016-04-10 VITALS — BP 118/72 | HR 85 | Temp 98.1°F | Resp 18 | Wt 277.2 lb

## 2016-04-10 DIAGNOSIS — B349 Viral infection, unspecified: Secondary | ICD-10-CM

## 2016-04-10 NOTE — Progress Notes (Signed)
Patient ID: Kristi Martin MRN: [id number], DOB: Aug 15, 1970, 46 y.o. Date of Encounter: 04/10/2016, 3:37 PM    Chief Complaint:  Chief Complaint  Patient presents with  . Nasal Congestion  . Chills     HPI: 46 y.o. year old female  says symptoms started Tuesday night which was 04/08/16. At that time she was having vomiting and diarrhea. Says that on Wednesday she went to work -- works first shift.  Hadn't had anything to eat so she wasn't having any further vomiting, but did have diarrhea there.  Today she had had something to eat so her stool was a little more solid but she did vomit 2 times this morning. Yesterday she was having sweats but was feeling cold at the same time. Did not check temperature. Now she is also having sneezing. She is sucking on cough drops so that she will not cough. Says her eyes and her nose are running. Went into work today but they sent her home. Works Monday through Friday. Temperature here 98.1.     Home Meds:   Outpatient Medications Prior to Visit  Medication Sig Dispense Refill  . aspirin EC 81 MG tablet Take 81 mg by mouth daily.    Marland Kitchen FLUoxetine (PROZAC) 40 MG capsule Take 1 capsule (40 mg total) by mouth daily. 30 capsule 3  . HYDROcodone-acetaminophen (NORCO/VICODIN) 5-325 MG tablet Take 1-2 tablets by mouth 2 (two) times daily as needed for moderate pain. 100 tablet 0  . ondansetron (ZOFRAN ODT) 4 MG disintegrating tablet Take 1 tablet (4 mg total) by mouth every 8 (eight) hours as needed for nausea or vomiting. 20 tablet 0  . pantoprazole (PROTONIX) 20 MG tablet Take 1 tablet (20 mg total) by mouth daily. (Patient not taking: Reported on 02/03/2016) 21 tablet 0  . amoxicillin (AMOXIL) 500 MG capsule Take 1 capsule (500 mg total) by mouth 3 (three) times daily. 21 capsule 0  . cyclobenzaprine (FLEXERIL) 10 MG tablet Take 1 tablet (10 mg total) by mouth 3 (three) times daily as needed for muscle spasms. 12 tablet 0  . esomeprazole (NEXIUM) 40 MG  capsule Take 1 capsule (40 mg total) by mouth daily at 12 noon. 30 capsule 3  . ibuprofen (ADVIL,MOTRIN) 600 MG tablet Take 1 tablet (600 mg total) by mouth every 8 (eight) hours as needed. 15 tablet 0  . predniSONE (DELTASONE) 10 MG tablet Take 40mg  x 5 days 20 tablet 0  . sucralfate (CARAFATE) 1 g tablet Take 1 tablet (1 g total) by mouth 2 (two) times daily. (Patient not taking: Reported on 02/03/2016) 60 tablet 3   No facility-administered medications prior to visit.     Allergies:  Allergies  Allergen Reactions  . Benadryl [Diphenhydramine Hcl] Anaphylaxis and Swelling    Back of throat closes  . Onion Anaphylaxis      Review of Systems: See HPI for pertinent ROS. All other ROS negative.    Physical Exam: Blood pressure 118/72, pulse 85, temperature 98.1 F (36.7 C), temperature source Oral, resp. rate 18, weight 277 lb 3.2 oz (125.7 kg), last menstrual period 03/15/2016, SpO2 98 %., Body mass index is 47.58 kg/m. General:  Obese AAF. Appears in no acute distress. HEENT: Normocephalic, atraumatic, eyes without discharge, sclera non-icteric, nares are without discharge. Bilateral auditory canals clear, TM's are without perforation, pearly grey and translucent with reflective cone of light bilaterally. Oral cavity moist, posterior pharynx without exudate, erythema, peritonsillar abscess.  Neck: Supple. No thyromegaly. No lymphadenopathy.  Lungs: Clear bilaterally to auscultation without wheezes, rales, or rhonchi. Breathing is unlabored. Heart: Regular rhythm. No murmurs, rubs, or gallops. Abdomen: Soft,  non-distended with normoactive bowel sounds. No hepatomegaly. No rebound/guarding. No obvious abdominal masses.She reports tenderness with palpation epigastric region. No other area of tenderness with palpation. Msk:  Strength and tone normal for age. Extremities/Skin: Warm and dry. Neuro: Alert and oriented X 3. Moves all extremities spontaneously. Gait is normal. CNII-XII grossly  in tact. Psych:  Responds to questions appropriately with a normal affect.     ASSESSMENT AND PLAN:  46 y.o. year old female with  1. Viral illness Told her that even if she feels like she has appetite, do not eat solid foods. Stay with clear liquid diet then gradually advance to BRAT diet.  Use otc meds for cough and congestion.  Note given for out of work today and tomorrow--return Monday.  So this is contagious and to avoid close proximity with others to avoid spreading this. F/U If develops fever or worsening symptoms or if symptoms not resolving after 7 days duration.    [address] [place], [place], [facility] 04/10/2016 3:37 PM

## 2016-04-29 ENCOUNTER — Telehealth: Payer: Self-pay | Admitting: *Deleted

## 2016-04-29 MED ORDER — HYDROCODONE-ACETAMINOPHEN 5-325 MG PO TABS: 1.0000 | ORAL_TABLET | Freq: Two times a day (BID) | ORAL | 0 refills | Status: DC | PRN

## 2016-04-29 NOTE — Telephone Encounter (Signed)
Prescription printed and patient made aware to come to office to pick up per VM.  

## 2016-04-29 NOTE — Telephone Encounter (Signed)
Okay 

## 2016-04-29 NOTE — Telephone Encounter (Signed)
Received call from patient  Requested refill on Hydrocodone.   Ok to refill??  Last office visit 04/10/2016.  Last refill 03/31/2016.

## 2016-05-22 ENCOUNTER — Encounter (HOSPITAL_COMMUNITY): Payer: Self-pay | Admitting: *Deleted

## 2016-05-22 ENCOUNTER — Emergency Department (HOSPITAL_COMMUNITY)
Admission: EM | Admit: 2016-05-22 | Discharge: 2016-05-22 | Disposition: A | Discharge status: Home / Self Care | Payer: Self-pay | Attending: Emergency Medicine | Admitting: Emergency Medicine

## 2016-05-22 ENCOUNTER — Emergency Department (HOSPITAL_COMMUNITY): Payer: Self-pay

## 2016-05-22 DIAGNOSIS — Z7982 Long term (current) use of aspirin: Secondary | ICD-10-CM | POA: Insufficient documentation

## 2016-05-22 DIAGNOSIS — N390 Urinary tract infection, site not specified: Secondary | ICD-10-CM | POA: Insufficient documentation

## 2016-05-22 DIAGNOSIS — Z79899 Other long term (current) drug therapy: Secondary | ICD-10-CM | POA: Insufficient documentation

## 2016-05-22 DIAGNOSIS — F1410 Cocaine abuse, uncomplicated: Secondary | ICD-10-CM | POA: Insufficient documentation

## 2016-05-22 DIAGNOSIS — R791 Abnormal coagulation profile: Secondary | ICD-10-CM | POA: Insufficient documentation

## 2016-05-22 DIAGNOSIS — G4489 Other headache syndrome: Secondary | ICD-10-CM | POA: Insufficient documentation

## 2016-05-22 DIAGNOSIS — R51 Headache: Secondary | ICD-10-CM

## 2016-05-22 DIAGNOSIS — N183 Chronic kidney disease, stage 3 (moderate): Secondary | ICD-10-CM | POA: Insufficient documentation

## 2016-05-22 DIAGNOSIS — R519 Headache, unspecified: Secondary | ICD-10-CM

## 2016-05-22 LAB — CBC
HEMATOCRIT: 37.8 % (ref 36.0–46.0)
HEMOGLOBIN: 12.2 g/dL (ref 12.0–15.0)
MCH: 27.8 pg (ref 26.0–34.0)
MCHC: 32.3 g/dL (ref 30.0–36.0)
MCV: 86.1 fL (ref 78.0–100.0)
Platelets: 298 10*3/uL (ref 150–400)
RBC: 4.39 MIL/uL (ref 3.87–5.11)
RDW: 14.1 % (ref 11.5–15.5)
WBC: 6.3 10*3/uL (ref 4.0–10.5)

## 2016-05-22 LAB — URINALYSIS, ROUTINE W REFLEX MICROSCOPIC
BILIRUBIN URINE: NEGATIVE
Glucose, UA: NEGATIVE mg/dL
KETONES UR: NEGATIVE mg/dL
NITRITE: POSITIVE — AB
PROTEIN: NEGATIVE mg/dL
SPECIFIC GRAVITY, URINE: 1.012 (ref 1.005–1.030)
pH: 6 (ref 5.0–8.0)

## 2016-05-22 LAB — DIFFERENTIAL
BASOS PCT: 1 %
Basophils Absolute: 0 10*3/uL (ref 0.0–0.1)
EOS ABS: 0.1 10*3/uL (ref 0.0–0.7)
Eosinophils Relative: 1 %
LYMPHS ABS: 2.1 10*3/uL (ref 0.7–4.0)
Lymphocytes Relative: 33 %
MONO ABS: 0.6 10*3/uL (ref 0.1–1.0)
MONOS PCT: 9 %
NEUTROS ABS: 3.5 10*3/uL (ref 1.7–7.7)
Neutrophils Relative %: 56 %

## 2016-05-22 LAB — PROTIME-INR
INR: 0.95
Prothrombin Time: 12.7 seconds (ref 11.4–15.2)

## 2016-05-22 LAB — COMPREHENSIVE METABOLIC PANEL
ALT: 11 U/L — AB (ref 14–54)
AST: 13 U/L — ABNORMAL LOW (ref 15–41)
Albumin: 3.5 g/dL (ref 3.5–5.0)
Alkaline Phosphatase: 73 U/L (ref 38–126)
Anion gap: 7 (ref 5–15)
BILIRUBIN TOTAL: 0.2 mg/dL — AB (ref 0.3–1.2)
BUN: 13 mg/dL (ref 6–20)
CHLORIDE: 105 mmol/L (ref 101–111)
CO2: 28 mmol/L (ref 22–32)
CREATININE: 1.14 mg/dL — AB (ref 0.44–1.00)
Calcium: 8.8 mg/dL — ABNORMAL LOW (ref 8.9–10.3)
GFR, EST NON AFRICAN AMERICAN: 56 mL/min — AB (ref >60)
Glucose, Bld: 88 mg/dL (ref 65–99)
Potassium: 3.7 mmol/L (ref 3.5–5.1)
Sodium: 140 mmol/L (ref 135–145)
TOTAL PROTEIN: 7.1 g/dL (ref 6.5–8.1)

## 2016-05-22 LAB — ETHANOL

## 2016-05-22 LAB — APTT: aPTT: 31 seconds (ref 24–36)

## 2016-05-22 LAB — RAPID URINE DRUG SCREEN, HOSP PERFORMED
Amphetamines: NOT DETECTED
Barbiturates: NOT DETECTED
Benzodiazepines: NOT DETECTED
COCAINE: POSITIVE — AB
OPIATES: NOT DETECTED
TETRAHYDROCANNABINOL: NOT DETECTED

## 2016-05-22 LAB — TROPONIN I: Troponin I: <0.03 ng/mL (ref <0.03)

## 2016-05-22 LAB — PREGNANCY, URINE: PREG TEST UR: NEGATIVE

## 2016-05-22 MED ORDER — CEPHALEXIN 500 MG PO CAPS: 500.0000 mg | ORAL_CAPSULE | Freq: Four times a day (QID) | ORAL | 0 refills | Status: DC

## 2016-05-22 MED ORDER — DEXAMETHASONE 4 MG PO TABS
8.0000 mg | ORAL_TABLET | Freq: Once | ORAL | Status: AC
  Administered 2016-05-22: 8 mg via ORAL
  Filled 2016-05-22: qty 2

## 2016-05-22 MED ORDER — METOCLOPRAMIDE HCL 10 MG PO TABS: 10.0000 mg | ORAL_TABLET | Freq: Four times a day (QID) | ORAL | 0 refills | Status: DC | PRN

## 2016-05-22 MED ORDER — SODIUM CHLORIDE 0.9 % IV BOLUS (SEPSIS)
1000.0000 mL | Freq: Once | INTRAVENOUS | Status: AC
  Administered 2016-05-22: 1000 mL via INTRAVENOUS

## 2016-05-22 MED ORDER — KETOROLAC TROMETHAMINE 30 MG/ML IJ SOLN
30.0000 mg | Freq: Once | INTRAMUSCULAR | Status: AC
  Administered 2016-05-22: 30 mg via INTRAVENOUS
  Filled 2016-05-22: qty 1

## 2016-05-22 NOTE — Discharge Instructions (Signed)
Take over the counter tylenol, ibuprofen, as directed on packaging, with the prescription given to you today, as needed for headache.  Keep a headache diary, as discussed.  Call your regular medical doctor today to schedule a follow up appointment within the next 2 days. Call the Neurologist today to schedule a follow up appointment within the next week.  Return to the Emergency Department immediately sooner if worsening.

## 2016-05-22 NOTE — ED Triage Notes (Signed)
Pt c/o left sided severe headache, left sided facial numbness, left arm weakness that she woke up with this morning around 0440 and got progressively worse around 0800. Pt also reports she is seeing all one color, either green or red at times. Pt reports her symptoms never went away this morning they just progressively got worse at 0800. LKW 2100 last night.

## 2016-05-22 NOTE — ED Notes (Signed)
ED Provider at bedside. 

## 2016-05-22 NOTE — ED Provider Notes (Signed)
[facility] DEPT Provider Note   CSN: [id number] Arrival date & time: 05/22/16  1023     History   Chief Complaint Chief Complaint  Patient presents with  . Headache  . Extremity Weakness    left sided    HPI Kristi Martin is a 46 y.o. female.   Headache    Extremity Weakness  Associated symptoms include headaches.    Pt was seen at 1055. Per pt, c/o gradual onset and persistence of constant acute flair of her chronic migraine headache since waking up this morning at 0440.  Headache has been associated with "seeing green spots," left sided facial, UE and LE "numbness." Pt describes her symptoms as per her usual chronic headache pain pattern for the past several years.  Denies headache was sudden or maximal in onset or at any time.  Denies visual changes, no focal motor weakness, no eye pain, no fevers, no neck pain, no rash.     Past Medical History:  Diagnosis Date  . Anemia   . Cancer ([place])    cervical cx at age 60  . Cervical cancer ([place])   . Chronic back pain   . Chronic kidney disease   . CKD (chronic kidney disease), symptom management only, stage 3 (moderate)   . DDD (degenerative disc disease), lumbar    Facet arthritis  . Hypotension   . Left knee pain   . Migraine   . Peripheral neuropathy   . Sciatica   . Vaginal Pap smear, abnormal     Patient Active Problem List   Diagnosis Date Noted  . MDD (major depressive disorder) 06/08/2015  . GAD (generalized anxiety disorder) 02/07/2015  . DDD (degenerative disc disease), lumbar 10/10/2014  . Facet arthropathy, lumbar ([place]) 10/10/2014  . HGSIL (high grade squamous intraepithelial lesion) on Pap smear of cervix 08/04/2014  . Chronic back pain 07/27/2014  . GERD (gastroesophageal reflux disease) 07/27/2014  . Peripheral neuropathy 06/28/2014  . Obesity 06/28/2014  . Glucose intolerance (impaired glucose tolerance) 04/03/2014  . Chronic anemia 04/01/2014  . CKD (chronic kidney disease) stage 3, GFR  30-59 ml/min 04/01/2014    Past Surgical History:  Procedure Laterality Date  . CHOLECYSTECTOMY N/A 01/20/2013   Procedure: LAPAROSCOPIC CHOLECYSTECTOMY WITH INTRAOPERATIVE CHOLANGIOGRAM;  Surgeon: Shann Medal, MD;  Location: [facility] ORS;  Service: General;  Laterality: N/A;  . TUBAL LIGATION      OB History    Gravida Para Term Preterm AB Living   9 7 5 2 2 7    SAB TAB Ectopic Multiple Live Births   2       1       Home Medications    Prior to Admission medications   Medication Sig Start Date End Date Taking? Authorizing Provider  aspirin EC 81 MG tablet Take 81 mg by mouth daily.   Yes Historical Provider, MD  FLUoxetine (PROZAC) 40 MG capsule Take 1 capsule (40 mg total) by mouth daily. 10/31/15  Yes Alycia Rossetti, MD  HYDROcodone-acetaminophen (NORCO/VICODIN) 5-325 MG tablet Take 1-2 tablets by mouth 2 (two) times daily as needed for moderate pain. 04/29/16  Yes Alycia Rossetti, MD  ondansetron (ZOFRAN ODT) 4 MG disintegrating tablet Take 1 tablet (4 mg total) by mouth every 8 (eight) hours as needed for nausea or vomiting. 11/24/15  Yes Carmin Muskrat, MD    Family History Family History  Problem Relation Age of Onset  . Cancer Mother   . Colon cancer Mother   .  Breast cancer Maternal Grandmother   . Heart disease Maternal Grandmother   . Breast cancer Paternal Grandmother   . Heart disease Paternal Grandmother   . Hypertension Paternal Grandmother   . Alcohol abuse Father   . Drug abuse Father   . Asthma Brother   . Cancer Paternal Grandfather     Social History Social History  Substance Use Topics  . Smoking status: Never Smoker  . Smokeless tobacco: Never Used  . Alcohol use No     Allergies   Benadryl [diphenhydramine hcl] and Onion   Review of Systems Review of Systems  Musculoskeletal: Positive for extremity weakness.  Neurological: Positive for headaches.  ROS: Statement: All systems negative except as marked or noted in the HPI;  Constitutional: Negative for fever and chills. ; ; Eyes: Negative for eye pain, redness and discharge. ; ; ENMT: Negative for ear pain, hoarseness, nasal congestion, sinus pressure and sore throat. ; ; Cardiovascular: Negative for chest pain, palpitations, diaphoresis, dyspnea and peripheral edema. ; ; Respiratory: Negative for cough, wheezing and stridor. ; ; Gastrointestinal: Negative for nausea, vomiting, diarrhea, abdominal pain, blood in stool, hematemesis, jaundice and rectal bleeding. . ; ; Genitourinary: Negative for dysuria, flank pain and hematuria. ; ; Musculoskeletal: Negative for back pain and neck pain. Negative for swelling and trauma.; ; Skin: Negative for pruritus, rash, abrasions, blisters, bruising and skin lesion.; ; Neuro: +headache, paresthesias. Negative for lightheadedness and neck stiffness. Negative for weakness, altered level of consciousness, altered mental status, extremity weakness, involuntary movement, seizure and syncope.      Physical Exam Updated Vital Signs BP 108/66   Pulse 72   Temp 97.6 F (36.4 C)   Resp 20   Ht 5\' 5"  (1.651 m)   Wt 277 lb (125.6 kg)   LMP 04/12/2016   SpO2 95%   BMI 46.10 kg/m   Physical Exam 1100: Physical examination:  Nursing notes reviewed; Vital signs and O2 SAT reviewed;  Constitutional: Well developed, Well nourished, Well hydrated, In no acute distress; Head:  Normocephalic, atraumatic; Eyes: EOMI, PERRL, No scleral icterus; ENMT: Mouth and pharynx normal, Mucous membranes moist; Neck: Supple, Full range of motion, No lymphadenopathy; Cardiovascular: Regular rate and rhythm, No gallop; Respiratory: Breath sounds clear & equal bilaterally, No wheezes.  Speaking full sentences with ease, Normal respiratory effort/excursion; Chest: Nontender, Movement normal; Abdomen: Soft, Nontender, Nondistended, Normal bowel sounds; Genitourinary: No CVA tenderness; Extremities: Pulses normal, No tenderness, No edema, No calf edema or asymmetry.;  Neuro: AA&Ox3, Major CN grossly intact. Speech clear.  No facial droop.  No nystagmus. Grips equal. Strength 5/5 equal bilat UE's and LE's.  DTR 2/4 equal bilat UE's and LE's. +subjective decreased sensation LUE and LLE, otherwise no gross sensory deficits.  Normal cerebellar testing bilat UE's (finger-nose) and LE's (heel-shin)..; Skin: Color normal, Warm, Dry.   ED Treatments / Results  Labs (all labs ordered are listed, but only abnormal results are displayed)   EKG  EKG Interpretation  Date/Time:  Thursday May 22 2016 11:15:32 EDT Ventricular Rate:  72 PR Interval:    QRS Duration: 71 QT Interval:  410 QTC Calculation: 449 R Axis:   49 Text Interpretation:  Sinus rhythm ST elev, probable normal early repol pattern Baseline wander When compared with ECG of 11/24/2015 No significant change was found Confirmed by [facility]  MD, Nunzio Cory [phone]) on 05/22/2016 11:24:51 AM       Radiology   Procedures Procedures (including critical care time)  Medications Ordered in ED Medications  sodium chloride 0.9 % bolus 1,000 mL (1,000 mLs Intravenous New Bag/Given 05/22/16 1119)  ketorolac (TORADOL) 30 MG/ML injection 30 mg (30 mg Intravenous Given 05/22/16 1119)  dexamethasone (DECADRON) tablet 8 mg (8 mg Oral Given 05/22/16 1119)     Initial Impression / Assessment and Plan / ED Course  I have reviewed the triage vital signs and the nursing notes.  Pertinent labs & imaging results that were available during my care of the patient were reviewed by me and considered in my medical decision making (see chart for details).  MDM Reviewed: previous chart, nursing note and vitals Reviewed previous: labs and ECG Interpretation: labs, ECG and CT scan   Results for orders placed or performed during the hospital encounter of 05/22/16  Ethanol  Result Value Ref Range   Alcohol, Ethyl (B) <5 <5 mg/dL  Protime-INR  Result Value Ref Range   Prothrombin Time 12.7 11.4 - 15.2 seconds   INR  0.95   APTT  Result Value Ref Range   aPTT 31 24 - 36 seconds  CBC  Result Value Ref Range   WBC 6.3 4.0 - 10.5 K/uL   RBC 4.39 3.87 - 5.11 MIL/uL   Hemoglobin 12.2 12.0 - 15.0 g/dL   HCT 37.8 36.0 - 46.0 %   MCV 86.1 78.0 - 100.0 fL   MCH 27.8 26.0 - 34.0 pg   MCHC 32.3 30.0 - 36.0 g/dL   RDW 14.1 11.5 - 15.5 %   Platelets 298 150 - 400 K/uL  Differential  Result Value Ref Range   Neutrophils Relative % 56 %   Neutro Abs 3.5 1.7 - 7.7 K/uL   Lymphocytes Relative 33 %   Lymphs Abs 2.1 0.7 - 4.0 K/uL   Monocytes Relative 9 %   Monocytes Absolute 0.6 0.1 - 1.0 K/uL   Eosinophils Relative 1 %   Eosinophils Absolute 0.1 0.0 - 0.7 K/uL   Basophils Relative 1 %   Basophils Absolute 0.0 0.0 - 0.1 K/uL  Comprehensive metabolic panel  Result Value Ref Range   Sodium 140 135 - 145 mmol/L   Potassium 3.7 3.5 - 5.1 mmol/L   Chloride 105 101 - 111 mmol/L   CO2 28 22 - 32 mmol/L   Glucose, Bld 88 65 - 99 mg/dL   BUN 13 6 - 20 mg/dL   Creatinine, Ser 1.14 (H) 0.44 - 1.00 mg/dL   Calcium 8.8 (L) 8.9 - 10.3 mg/dL   Total Protein 7.1 6.5 - 8.1 g/dL   Albumin 3.5 3.5 - 5.0 g/dL   AST 13 (L) 15 - 41 U/L   ALT 11 (L) 14 - 54 U/L   Alkaline Phosphatase 73 38 - 126 U/L   Total Bilirubin 0.2 (L) 0.3 - 1.2 mg/dL   GFR calc non Af Amer 56 (L) >60 mL/min   GFR calc Af Amer >60 >60 mL/min   Anion gap 7 5 - 15  Urine rapid drug screen (hosp performed)not at [facility]  Result Value Ref Range   Opiates NONE DETECTED NONE DETECTED   Cocaine POSITIVE (A) NONE DETECTED   Benzodiazepines NONE DETECTED NONE DETECTED   Amphetamines NONE DETECTED NONE DETECTED   Tetrahydrocannabinol NONE DETECTED NONE DETECTED   Barbiturates NONE DETECTED NONE DETECTED  Urinalysis, Routine w reflex microscopic  Result Value Ref Range   Color, Urine YELLOW YELLOW   APPearance HAZY (A) CLEAR   Specific Gravity, Urine 1.012 1.005 - 1.030   pH 6.0 5.0 - 8.0   Glucose,  UA NEGATIVE NEGATIVE mg/dL   Hgb urine dipstick  SMALL (A) NEGATIVE   Bilirubin Urine NEGATIVE NEGATIVE   Ketones, ur NEGATIVE NEGATIVE mg/dL   Protein, ur NEGATIVE NEGATIVE mg/dL   Nitrite POSITIVE (A) NEGATIVE   Leukocytes, UA MODERATE (A) NEGATIVE   RBC / HPF 0-5 0 - 5 RBC/hpf   WBC, UA 6-30 0 - 5 WBC/hpf   Bacteria, UA MANY (A) NONE SEEN   Squamous Epithelial / LPF 0-5 (A) NONE SEEN   Mucous PRESENT   Troponin I  Result Value Ref Range   Troponin I <0.03 <0.03 ng/mL  Pregnancy, urine  Result Value Ref Range   Preg Test, Ur NEGATIVE NEGATIVE   Ct Head Wo Contrast Result Date: 05/22/2016 CLINICAL DATA:  Woke up with left-sided numbness. EXAM: CT HEAD WITHOUT CONTRAST TECHNIQUE: Contiguous axial images were obtained from the base of the skull through the vertex without intravenous contrast. COMPARISON:  Head CT 02/03/2016 FINDINGS: Brain: The ventricles are normal in size and configuration. No extra-axial fluid collections are identified. The gray-white differentiation is normal. No CT findings for acute intracranial process such as hemorrhage or infarction. No mass lesions. The brainstem and cerebellum are grossly normal. Vascular: No hyperdense vessels, aneurysm or unexpected calcifications. Skull: No bone lesions or fracture. Sinuses/Orbits: The paranasal sinuses and mastoid air cells are clear. The globes are intact. Other: No scalp lesions. IMPRESSION: Normal head CT.  No change since prior examination. Electronically Signed   By: Marijo Sanes M.D.   On: 05/22/2016 11:03    1430: EPIC chart reviewed: pt has had similar headaches with left sided paresthesias for the past several years. Symptoms are improved after usual headache meds and pt wants to go home now. Has tol PO well while in the ED without N/V. Has ambulated with steady gait. Will tx for UTI and symptomatically for headache. Dx and testing d/w pt.  Questions answered.  Verb understanding, agreeable to d/c home with outpt f/u.    Final Clinical Impressions(s) / ED  Diagnoses   Final diagnoses:  None    New Prescriptions New Prescriptions   No medications on file      Francine Graven, DO 05/25/16 1646

## 2016-05-22 NOTE — ED Notes (Signed)
Pt ambulated to BR without difficulty

## 2016-05-27 ENCOUNTER — Ambulatory Visit (INDEPENDENT_AMBULATORY_CARE_PROVIDER_SITE_OTHER): Payer: Self-pay | Admitting: Family Medicine

## 2016-05-27 ENCOUNTER — Encounter: Payer: Self-pay | Admitting: Family Medicine

## 2016-05-27 VITALS — BP 122/70 | HR 84 | Temp 98.1°F | Resp 14 | Ht 65.0 in | Wt 265.0 lb

## 2016-05-27 DIAGNOSIS — M545 Low back pain, unspecified: Secondary | ICD-10-CM

## 2016-05-27 DIAGNOSIS — F332 Major depressive disorder, recurrent severe without psychotic features: Secondary | ICD-10-CM

## 2016-05-27 DIAGNOSIS — F1910 Other psychoactive substance abuse, uncomplicated: Secondary | ICD-10-CM

## 2016-05-27 DIAGNOSIS — N390 Urinary tract infection, site not specified: Secondary | ICD-10-CM

## 2016-05-27 DIAGNOSIS — G8929 Other chronic pain: Secondary | ICD-10-CM

## 2016-05-27 MED ORDER — FLUOXETINE HCL 20 MG PO TABS: 20.0000 mg | ORAL_TABLET | Freq: Every day | ORAL | 3 refills | Status: DC

## 2016-05-27 NOTE — Assessment & Plan Note (Addendum)
Failed drug screen with use of cocaine. She will no longer be prescribing any narcotic medication this was explained to her she voiced understanding  Okay to return to work

## 2016-05-27 NOTE — Assessment & Plan Note (Signed)
Depression with anxiety, had been off her prozac for past month, wants to restart, will restart at 20mg  once a day

## 2016-05-27 NOTE — Patient Instructions (Signed)
f/u 3  MONTHS PHYSICAL Restart prozac 20mg  once a day

## 2016-05-27 NOTE — Progress Notes (Signed)
   Subjective:    Patient ID: Kristi Martin, female    DOB: [birth year], 46 y.o.   MRN: [id number]  Patient presents for ER F/U  Patient here to follow-up emergency room and needs a note to return to work. She was seen in the ER complaining of headache since early that morning. She states she been seen spots and had some numbness in her upper extremity. Labs were obtained including drug screen which was positive for cocaine CT of head was negative for acute infarction, EKG and troponins were negative She's been seen multiple times for these episodes. In the ER she told them that her symptoms went away that she was ready to go home she was able to tolerate by mouth and had a normal gait. She was given treatment for UTI with  Keflex  that was also noted on her urinalysis. She still has not picked up the  is planning to do so today states that she has some problems with her insurance. She admits to the use of cocaine and states that she was having a stressful day but will no longer use. Visit she's been off her Prozac for the past month due to insurance purposes the was to restart this as it did help. She needs a note to return to work   She has been on chronic hydrocodone which was last prescribed on March 27 secondary to her chronic back pain due to result of degenerative disc disease. She also has some facet arthritis.  Review Of Systems:  GEN- denies fatigue, fever, weight loss,weakness, recent illness HEENT- denies eye drainage, change in vision, nasal discharge, CVS- denies chest pain, palpitations RESP- denies SOB, cough, wheeze ABD- denies N/V, change in stools, abd pain GU- denies dysuria, hematuria, dribbling, incontinence MSK- denies joint pain, muscle aches, injury Neuro- denies headache, dizziness, syncope, seizure activity       Objective:    BP 122/70   Pulse 84   Temp 98.1 F (36.7 C) (Oral)   Resp 14   Ht 5\' 5"  (1.651 m)   Wt 265 lb (120.2 kg)   SpO2 98%   BMI  44.10 kg/m  GEN- NAD, alert and oriented x3 HEENT- PERRL, EOMI, non injected sclera, pink conjunctiva, MMM, oropharynx clear CVS- RRR, no murmur RESP-CTAB Neuro-CNII-XII intact no deficits  Psych- normal affect and mood  EXT- No edema Pulses- Radial 2+        Assessment & Plan:      Problem List Items Addressed This Visit    Substance abuse   MDD (major depressive disorder)    Depression with anxiety, had been off her prozac for past month, wants to restart, will restart at 20mg  once a day       Relevant Medications   FLUoxetine (PROZAC) 20 MG tablet   Chronic back pain - Primary    Failed drug screen with use of cocaine. She will no longer be prescribing any narcotic medication this was explained to her she voiced understanding  Okay to return to work       Other Visit Diagnoses    Urinary tract infection without hematuria, site unspecified       She is to pick up the keflex today      Note: This dictation was prepared with Dragon dictation along with smaller phrase technology. Any transcriptional errors that result from this process are unintentional.

## 2016-05-29 ENCOUNTER — Other Ambulatory Visit: Payer: Self-pay | Admitting: *Deleted

## 2016-05-29 MED ORDER — FLUOXETINE HCL 20 MG PO CAPS: 20.0000 mg | ORAL_CAPSULE | Freq: Every day | ORAL | 3 refills | Status: DC

## 2016-05-30 ENCOUNTER — Encounter: Payer: Self-pay | Admitting: Family Medicine

## 2016-07-07 ENCOUNTER — Encounter: Payer: Self-pay | Admitting: Family Medicine

## 2016-08-05 ENCOUNTER — Encounter (HOSPITAL_COMMUNITY): Payer: Self-pay | Admitting: *Deleted

## 2016-08-05 ENCOUNTER — Emergency Department (HOSPITAL_COMMUNITY)
Admission: EM | Admit: 2016-08-05 | Discharge: 2016-08-05 | Disposition: A | Discharge status: Home / Self Care | Payer: Self-pay | Attending: Emergency Medicine | Admitting: Emergency Medicine

## 2016-08-05 ENCOUNTER — Emergency Department (HOSPITAL_COMMUNITY): Payer: Self-pay

## 2016-08-05 DIAGNOSIS — R55 Syncope and collapse: Secondary | ICD-10-CM | POA: Insufficient documentation

## 2016-08-05 DIAGNOSIS — Z79899 Other long term (current) drug therapy: Secondary | ICD-10-CM | POA: Insufficient documentation

## 2016-08-05 DIAGNOSIS — Z7982 Long term (current) use of aspirin: Secondary | ICD-10-CM | POA: Insufficient documentation

## 2016-08-05 DIAGNOSIS — N183 Chronic kidney disease, stage 3 (moderate): Secondary | ICD-10-CM | POA: Insufficient documentation

## 2016-08-05 DIAGNOSIS — N10 Acute tubulo-interstitial nephritis: Secondary | ICD-10-CM | POA: Insufficient documentation

## 2016-08-05 DIAGNOSIS — Z8541 Personal history of malignant neoplasm of cervix uteri: Secondary | ICD-10-CM | POA: Insufficient documentation

## 2016-08-05 LAB — COMPREHENSIVE METABOLIC PANEL
ALBUMIN: 3.1 g/dL — AB (ref 3.5–5.0)
ALK PHOS: 75 U/L (ref 38–126)
ALT: 9 U/L — ABNORMAL LOW (ref 14–54)
AST: 16 U/L (ref 15–41)
Anion gap: 12 (ref 5–15)
BILIRUBIN TOTAL: 0.3 mg/dL (ref 0.3–1.2)
BUN: 15 mg/dL (ref 6–20)
CALCIUM: 8.5 mg/dL — AB (ref 8.9–10.3)
CO2: 21 mmol/L — ABNORMAL LOW (ref 22–32)
Chloride: 104 mmol/L (ref 101–111)
Creatinine, Ser: 1.37 mg/dL — ABNORMAL HIGH (ref 0.44–1.00)
GFR calc Af Amer: 52 mL/min — ABNORMAL LOW (ref >60)
GFR calc non Af Amer: 45 mL/min — ABNORMAL LOW (ref >60)
GLUCOSE: 116 mg/dL — AB (ref 65–99)
Potassium: 3.4 mmol/L — ABNORMAL LOW (ref 3.5–5.1)
Sodium: 137 mmol/L (ref 135–145)
Total Protein: 6.8 g/dL (ref 6.5–8.1)

## 2016-08-05 LAB — CBC WITH DIFFERENTIAL/PLATELET
BASOS PCT: 1 %
Basophils Absolute: 0 10*3/uL (ref 0.0–0.1)
Eosinophils Absolute: 0.3 10*3/uL (ref 0.0–0.7)
Eosinophils Relative: 5 %
HEMATOCRIT: 36.8 % (ref 36.0–46.0)
HEMOGLOBIN: 11.8 g/dL — AB (ref 12.0–15.0)
Lymphocytes Relative: 29 %
Lymphs Abs: 1.8 10*3/uL (ref 0.7–4.0)
MCH: 27 pg (ref 26.0–34.0)
MCHC: 32.1 g/dL (ref 30.0–36.0)
MCV: 84.2 fL (ref 78.0–100.0)
MONOS PCT: 8 %
Monocytes Absolute: 0.5 10*3/uL (ref 0.1–1.0)
NEUTROS ABS: 3.5 10*3/uL (ref 1.7–7.7)
NEUTROS PCT: 57 %
Platelets: 296 10*3/uL (ref 150–400)
RBC: 4.37 MIL/uL (ref 3.87–5.11)
RDW: 14.3 % (ref 11.5–15.5)
WBC: 6.1 10*3/uL (ref 4.0–10.5)

## 2016-08-05 LAB — URINALYSIS, ROUTINE W REFLEX MICROSCOPIC
Bilirubin Urine: NEGATIVE
GLUCOSE, UA: NEGATIVE mg/dL
Ketones, ur: NEGATIVE mg/dL
Nitrite: NEGATIVE
PH: 5 (ref 5.0–8.0)
PROTEIN: NEGATIVE mg/dL
RBC / HPF: NONE SEEN RBC/hpf (ref 0–5)
Specific Gravity, Urine: 1.02 (ref 1.005–1.030)

## 2016-08-05 LAB — LIPASE, BLOOD: Lipase: 36 U/L (ref 11–51)

## 2016-08-05 LAB — CBG MONITORING, ED: Glucose-Capillary: 122 mg/dL — ABNORMAL HIGH (ref 65–99)

## 2016-08-05 MED ORDER — ONDANSETRON 4 MG PO TBDP: ORAL_TABLET | ORAL | 0 refills | Status: DC

## 2016-08-05 MED ORDER — KETOROLAC TROMETHAMINE 30 MG/ML IJ SOLN
30.0000 mg | Freq: Once | INTRAMUSCULAR | Status: AC
  Administered 2016-08-05: 30 mg via INTRAVENOUS
  Filled 2016-08-05: qty 1

## 2016-08-05 MED ORDER — ONDANSETRON HCL 4 MG/2ML IJ SOLN
4.0000 mg | Freq: Once | INTRAMUSCULAR | Status: AC
  Administered 2016-08-05: 4 mg via INTRAVENOUS
  Filled 2016-08-05: qty 2

## 2016-08-05 MED ORDER — CEPHALEXIN 500 MG PO CAPS: 500.0000 mg | ORAL_CAPSULE | Freq: Four times a day (QID) | ORAL | 0 refills | Status: DC

## 2016-08-05 MED ORDER — SODIUM CHLORIDE 0.9 % IV BOLUS (SEPSIS)
1000.0000 mL | Freq: Once | INTRAVENOUS | Status: AC
  Administered 2016-08-05: 1000 mL via INTRAVENOUS

## 2016-08-05 MED ORDER — HYDROMORPHONE HCL 1 MG/ML IJ SOLN
0.5000 mg | Freq: Once | INTRAMUSCULAR | Status: AC
  Administered 2016-08-05: 0.5 mg via INTRAVENOUS
  Filled 2016-08-05: qty 1

## 2016-08-05 MED ORDER — DEXTROSE 5 % IV SOLN
1.0000 g | Freq: Once | INTRAVENOUS | Status: AC
  Administered 2016-08-05: 1 g via INTRAVENOUS
  Filled 2016-08-05: qty 10

## 2016-08-05 NOTE — ED Notes (Signed)
Patient transported to CT 

## 2016-08-05 NOTE — ED Notes (Signed)
Pt continues to c/o generalized pain.  States she can not give me a urine specimen.

## 2016-08-05 NOTE — ED Triage Notes (Signed)
Pt c/o vomiting, coughing, sneezing, neck pain, body aches, hot and cold chills that started last night. Denies fever. Pt has had 2 syncopal episodes since last night. Pt reports she woke up in the floor this morning. Pt reports left shoulder pain from syncopal episode. Pt A&O x 4 in triage.

## 2016-08-05 NOTE — ED Provider Notes (Addendum)
[facility] DEPT Provider Note   CSN: [id number] Arrival date & time: 08/05/16  1203     History   Chief Complaint Chief Complaint  Patient presents with  . Emesis  . Loss of Consciousness    HPI Kristi Martin is a 46 y.o. female.  Patient complains of nausea vomiting weakness. She passed out. Also mild cough   The history is provided by the patient. No language interpreter was used.  Emesis   This is a new problem. The current episode started 12 to 24 hours ago. The problem occurs 2 to 4 times per day. The problem has not changed since onset.The emesis has an appearance of stomach contents. There has been no fever. Associated symptoms include chills. Pertinent negatives include no abdominal pain, no cough, no diarrhea and no headaches. Risk factors include ill contacts.  Loss of Consciousness   Associated symptoms include vomiting. Pertinent negatives include abdominal pain, back pain, chest pain, congestion, headaches and seizures.    Past Medical History:  Diagnosis Date  . Anemia   . Cancer ([place])    cervical cx at age 1  . Cervical cancer ([place])   . Chronic back pain   . Chronic kidney disease   . CKD (chronic kidney disease), symptom management only, stage 3 (moderate)   . DDD (degenerative disc disease), lumbar    Facet arthritis  . Hypotension   . Left knee pain   . Migraine   . Peripheral neuropathy   . Sciatica   . Vaginal Pap smear, abnormal     Patient Active Problem List   Diagnosis Date Noted  . Substance abuse 05/27/2016  . MDD (major depressive disorder) 06/08/2015  . GAD (generalized anxiety disorder) 02/07/2015  . DDD (degenerative disc disease), lumbar 10/10/2014  . Facet arthropathy, lumbar ([place]) 10/10/2014  . HGSIL (high grade squamous intraepithelial lesion) on Pap smear of cervix 08/04/2014  . Chronic back pain 07/27/2014  . GERD (gastroesophageal reflux disease) 07/27/2014  . Peripheral neuropathy 06/28/2014  . Obesity 06/28/2014    . Glucose intolerance (impaired glucose tolerance) 04/03/2014  . Chronic anemia 04/01/2014  . CKD (chronic kidney disease) stage 3, GFR 30-59 ml/min 04/01/2014    Past Surgical History:  Procedure Laterality Date  . CHOLECYSTECTOMY N/A 01/20/2013   Procedure: LAPAROSCOPIC CHOLECYSTECTOMY WITH INTRAOPERATIVE CHOLANGIOGRAM;  Surgeon: Shann Medal, MD;  Location: [facility] ORS;  Service: General;  Laterality: N/A;  . TUBAL LIGATION      OB History    Gravida Para Term Preterm AB Living   9 7 5 2 2 7    SAB TAB Ectopic Multiple Live Births   2       1       Home Medications    Prior to Admission medications   Medication Sig Start Date End Date Taking? Authorizing Provider  aspirin EC 81 MG tablet Take 81 mg by mouth daily.   Yes [provider]  FLUoxetine (PROZAC) 20 MG capsule Take 1 capsule (20 mg total) by mouth daily. 05/29/16  Yes Le Grand, Modena Nunnery, MD  cephALEXin (KEFLEX) 500 MG capsule Take 1 capsule (500 mg total) by mouth 4 (four) times daily. 05/22/16   Francine Graven, DO  metoCLOPramide (REGLAN) 10 MG tablet Take 1 tablet (10 mg total) by mouth every 6 (six) hours as needed for nausea (or headache). Patient not taking: Reported on 05/27/2016 05/22/16   Francine Graven, DO    Family History Family History  Problem Relation Age of Onset  .  Cancer Mother   . Colon cancer Mother   . Breast cancer Maternal Grandmother   . Heart disease Maternal Grandmother   . Breast cancer Paternal Grandmother   . Heart disease Paternal Grandmother   . Hypertension Paternal Grandmother   . Alcohol abuse Father   . Drug abuse Father   . Asthma Brother   . Cancer Paternal Grandfather     Social History Social History  Substance Use Topics  . Smoking status: Never Smoker  . Smokeless tobacco: Never Used  . Alcohol use No     Allergies   Benadryl [diphenhydramine hcl] and Onion   Review of Systems Review of Systems  Constitutional: Positive for chills. Negative for  appetite change and fatigue.  HENT: Negative for congestion, ear discharge and sinus pressure.   Eyes: Negative for discharge.  Respiratory: Negative for cough.   Cardiovascular: Positive for syncope. Negative for chest pain.  Gastrointestinal: Positive for vomiting. Negative for abdominal pain and diarrhea.  Genitourinary: Negative for frequency and hematuria.  Musculoskeletal: Negative for back pain.  Skin: Negative for rash.  Neurological: Negative for seizures and headaches.  Psychiatric/Behavioral: Negative for hallucinations.     Physical Exam Updated Vital Signs BP 115/85   Pulse 83   Temp 97.8 F (36.6 C) (Oral)   Resp (!) 24   Ht 5\' 5"  (1.651 m)   Wt 122.5 kg (270 lb)   LMP 07/29/2016   SpO2 97%   BMI 44.93 kg/m   Physical Exam  Constitutional: She is oriented to person, place, and time. She appears well-developed.  HENT:  Head: Normocephalic.  Eyes: Conjunctivae and EOM are normal. No scleral icterus.  Neck: Neck supple. No thyromegaly present.  Cardiovascular: Normal rate and regular rhythm.  Exam reveals no gallop and no friction rub.   No murmur heard. Pulmonary/Chest: No stridor. She has no wheezes. She has no rales. She exhibits no tenderness.  Abdominal: She exhibits no distension. There is no tenderness. There is no rebound.  Musculoskeletal: Normal range of motion. She exhibits no edema.  Lymphadenopathy:    She has no cervical adenopathy.  Neurological: She is oriented to person, place, and time. She exhibits normal muscle tone. Coordination normal.  Skin: No rash noted. No erythema.  Psychiatric: She has a normal mood and affect. Her behavior is normal.     ED Treatments / Results  Labs (all labs ordered are listed, but only abnormal results are displayed) Labs Reviewed  COMPREHENSIVE METABOLIC PANEL - Abnormal; Notable for the following:       Result Value   Potassium 3.4 (*)    CO2 21 (*)    Glucose, Bld 116 (*)    Creatinine, Ser 1.37 (*)     Calcium 8.5 (*)    Albumin 3.1 (*)    ALT 9 (*)    GFR calc non Af Amer 45 (*)    GFR calc Af Amer 52 (*)    All other components within normal limits  URINALYSIS, ROUTINE W REFLEX MICROSCOPIC - Abnormal; Notable for the following:    APPearance CLOUDY (*)    Hgb urine dipstick SMALL (*)    Leukocytes, UA MODERATE (*)    Bacteria, UA MANY (*)    Squamous Epithelial / LPF 0-5 (*)    All other components within normal limits  CBC WITH DIFFERENTIAL/PLATELET - Abnormal; Notable for the following:    Hemoglobin 11.8 (*)    All other components within normal limits  CBG MONITORING, ED -  Abnormal; Notable for the following:    Glucose-Capillary 122 (*)    All other components within normal limits  URINE CULTURE  LIPASE, BLOOD    EKG  EKG Interpretation  Date/Time:  Tuesday August 05 2016 12:25:11 EDT Ventricular Rate:  78 PR Interval:    QRS Duration: 68 QT Interval:  392 QTC Calculation: 447 R Axis:   40 Text Interpretation:  Sinus rhythm ST elev, probable normal early repol pattern Confirmed by Milton Ferguson [phone]) on 08/05/2016 2:34:28 PM       Radiology Dg Chest 2 View  Result Date: 08/05/2016 CLINICAL DATA:  Cough, vomiting EXAM: CHEST  2 VIEW COMPARISON:  CT chest dated 02/03/2016 FINDINGS: Lungs are clear.  No pleural effusion or pneumothorax. The heart is normal in size. Visualized osseous structures are within normal limits. IMPRESSION: Normal chest radiographs. Electronically Signed   By: Julian Hy M.D.   On: 08/05/2016 13:03   Ct Head Wo Contrast  Result Date: 08/05/2016 CLINICAL DATA:  Syncopal episodes with fall EXAM: CT HEAD WITHOUT CONTRAST CT CERVICAL SPINE WITHOUT CONTRAST TECHNIQUE: Multidetector CT imaging of the head and cervical spine was performed following the standard protocol without intravenous contrast. Multiplanar CT image reconstructions of the cervical spine were also generated. COMPARISON:  05/22/16 FINDINGS: CT HEAD FINDINGS Brain: No  evidence of acute infarction, hemorrhage, hydrocephalus, extra-axial collection or mass lesion/mass effect. Vascular: No hyperdense vessel or unexpected calcification. Skull: Normal. Negative for fracture or focal lesion. Sinuses/Orbits: Mild mucosal thickening is noted in the ethmoid sinuses. Other: None. CT CERVICAL SPINE FINDINGS Alignment: Within normal limits. Skull base and vertebrae: 7 cervical segments are well visualized. Vertebral body height is well maintained. No acute fracture or acute facet abnormality is identified. Soft tissues and spinal canal: Long styloid processes are noted bilaterally. No other soft tissue abnormality is noted. Disc levels:  No acute abnormality noted. Upper chest: Within normal limits. Other: None IMPRESSION: CT of the head:  No acute intracranial abnormality noted. CT of the cervical spine:  No acute bony abnormality is noted. Prominent styloid process bilaterally. Electronically Signed   By: Inez Catalina M.D.   On: 08/05/2016 15:49   Ct Cervical Spine Wo Contrast  Result Date: 08/05/2016 CLINICAL DATA:  Syncopal episodes with fall EXAM: CT HEAD WITHOUT CONTRAST CT CERVICAL SPINE WITHOUT CONTRAST TECHNIQUE: Multidetector CT imaging of the head and cervical spine was performed following the standard protocol without intravenous contrast. Multiplanar CT image reconstructions of the cervical spine were also generated. COMPARISON:  05/22/16 FINDINGS: CT HEAD FINDINGS Brain: No evidence of acute infarction, hemorrhage, hydrocephalus, extra-axial collection or mass lesion/mass effect. Vascular: No hyperdense vessel or unexpected calcification. Skull: Normal. Negative for fracture or focal lesion. Sinuses/Orbits: Mild mucosal thickening is noted in the ethmoid sinuses. Other: None. CT CERVICAL SPINE FINDINGS Alignment: Within normal limits. Skull base and vertebrae: 7 cervical segments are well visualized. Vertebral body height is well maintained. No acute fracture or acute facet  abnormality is identified. Soft tissues and spinal canal: Long styloid processes are noted bilaterally. No other soft tissue abnormality is noted. Disc levels:  No acute abnormality noted. Upper chest: Within normal limits. Other: None IMPRESSION: CT of the head:  No acute intracranial abnormality noted. CT of the cervical spine:  No acute bony abnormality is noted. Prominent styloid process bilaterally. Electronically Signed   By: Inez Catalina M.D.   On: 08/05/2016 15:49    Procedures Procedures (including critical care time)  Medications Ordered in ED Medications  cefTRIAXone (ROCEPHIN) 1 g in dextrose 5 % 50 mL IVPB (not administered)  sodium chloride 0.9 % bolus 1,000 mL (0 mLs Intravenous Stopped 08/05/16 1520)  ketorolac (TORADOL) 30 MG/ML injection 30 mg (30 mg Intravenous Given 08/05/16 1241)  sodium chloride 0.9 % bolus 1,000 mL (1,000 mLs Intravenous New Bag/Given 08/05/16 1515)  HYDROmorphone (DILAUDID) injection 0.5 mg (0.5 mg Intravenous Given 08/05/16 1516)  ondansetron (ZOFRAN) injection 4 mg (4 mg Intravenous Given 08/05/16 1516)     Initial Impression / Assessment and Plan / ED Course  I have reviewed the triage vital signs and the nursing notes.  Pertinent labs & imaging results that were available during my care of the patient were reviewed by me and considered in my medical decision making (see chart for details).     Patient with urinary tract infection. Patient will be treated with Keflex Zofran and she will take Tylenol or Motrin for aches. She'll follow-up with her PCP in a week  Final Clinical Impressions(s) / ED Diagnoses   Final diagnoses:  None    New Prescriptions New Prescriptions   No medications on file     Milton Ferguson, MD 08/05/16 1640    Milton Ferguson, MD 08/05/16 1737

## 2016-08-05 NOTE — Discharge Instructions (Signed)
Drink plenty of fluids take Tylenol or Motrin for pain follow-up with her doctor next week.

## 2016-08-08 LAB — URINE CULTURE

## 2016-08-09 ENCOUNTER — Telehealth: Payer: Self-pay

## 2016-08-09 NOTE — Telephone Encounter (Signed)
Post ED Visit - Positive Culture Follow-up  Culture report reviewed by antimicrobial stewardship pharmacist:  []  Elenor Quinones, Pharm.D. []  Heide Guile, Pharm.D., BCPS AQ-ID []  Parks Neptune, Pharm.D., BCPS []  Alycia Rossetti, Pharm.D., BCPS []  Energy, Pharm.D., BCPS, AAHIVP []  Legrand Como, Pharm.D., BCPS, AAHIVP []  Salome Arnt, PharmD, BCPS []  Dimitri Ped, PharmD, BCPS []  Vincenza Hews, PharmD, BCPS Willis Modena Pharm D Positive urine culture Treated with Cephalexin, organism sensitive to the same and no further patient follow-up is required at this time.  Genia Del 08/09/2016, 9:47 AM

## 2016-08-20 ENCOUNTER — Encounter: Payer: Self-pay | Admitting: Family Medicine

## 2016-09-23 ENCOUNTER — Other Ambulatory Visit: Payer: Self-pay | Admitting: Physician Assistant

## 2016-09-23 ENCOUNTER — Ambulatory Visit: Payer: Self-pay | Admitting: Physician Assistant

## 2016-09-23 ENCOUNTER — Encounter: Payer: Self-pay | Admitting: Physician Assistant

## 2016-09-23 VITALS — BP 104/72 | HR 73 | Temp 97.7°F | Ht 65.0 in | Wt 262.5 lb

## 2016-09-23 DIAGNOSIS — Z1211 Encounter for screening for malignant neoplasm of colon: Secondary | ICD-10-CM

## 2016-09-23 DIAGNOSIS — M545 Low back pain, unspecified: Secondary | ICD-10-CM

## 2016-09-23 DIAGNOSIS — E559 Vitamin D deficiency, unspecified: Secondary | ICD-10-CM

## 2016-09-23 DIAGNOSIS — Z8742 Personal history of other diseases of the female genital tract: Secondary | ICD-10-CM

## 2016-09-23 DIAGNOSIS — G8929 Other chronic pain: Secondary | ICD-10-CM

## 2016-09-23 DIAGNOSIS — R7303 Prediabetes: Secondary | ICD-10-CM

## 2016-09-23 DIAGNOSIS — F419 Anxiety disorder, unspecified: Secondary | ICD-10-CM

## 2016-09-23 DIAGNOSIS — Z1239 Encounter for other screening for malignant neoplasm of breast: Secondary | ICD-10-CM

## 2016-09-23 DIAGNOSIS — E785 Hyperlipidemia, unspecified: Secondary | ICD-10-CM

## 2016-09-23 DIAGNOSIS — N189 Chronic kidney disease, unspecified: Secondary | ICD-10-CM

## 2016-09-23 DIAGNOSIS — E876 Hypokalemia: Secondary | ICD-10-CM

## 2016-09-23 NOTE — Progress Notes (Signed)
BP 104/72 (BP Location: Left Arm, Patient Position: Sitting, Cuff Size: Large)   Pulse 73   Temp 97.7 F (36.5 C) (Other (Comment))   Ht 5\' 5"  (1.651 m)   Wt 262 lb 8 oz (119.1 kg)   SpO2 97%   BMI 43.68 kg/m    Subjective:    Patient ID: Kristi Martin, female    DOB: 1970/05/09, 46 y.o.   MRN: [id number]  HPI: Kristi Martin is a 46 y.o. female presenting on 09/23/2016 for New Patient (Initial Visit) (c/o pain to right arm from elbow to mid lower arm for 2 weeks, denies injury)   HPI   Pt used to go to [facility] but she doesn't have insurance now  Pt not going anywhere for MH.  She is having a hard time sitting in exam room saying she is claustrophobic.   She is very fidgety and is having difficulty focusing.   Pt was on prozac in past but says she has been off it for about 3 weeks.   Pt says she never picked up her antibiotics for her pyelo b/c she couldn't afford it.  She says she is all better now and has no urinary symptoms.   Pt says she never used drugs.  She States that her positvie UDS was from some punch she drank at a party  Pt's Last PAP was in 2016- chart says she was referred to gyn but pt says she never saw anyone else.   EMR shows that pt saw dr Emeterio Reeve- for colposcopy on 08/04/2014- reported as benign and recommended f/u PAP at 12 and 24 months.  Pt says she hasn't had a pap since the one in 2016.  Relevant past medical, surgical, family and social history reviewed and updated as indicated. Interim medical history since our last visit reviewed. Allergies and medications reviewed and updated.   Current Outpatient Prescriptions:  .  aspirin EC 81 MG tablet, Take 81 mg by mouth daily., Disp: , Rfl:  .  cephALEXin (KEFLEX) 500 MG capsule, Take 1 capsule (500 mg total) by mouth 4 (four) times daily. (Patient not taking: Reported on 09/23/2016), Disp: 28 capsule, Rfl: 0 .  FLUoxetine (PROZAC) 20 MG capsule, Take 1 capsule (20 mg total) by mouth daily.  (Patient not taking: Reported on 09/23/2016), Disp: 90 capsule, Rfl: 3 .  metoCLOPramide (REGLAN) 10 MG tablet, Take 1 tablet (10 mg total) by mouth every 6 (six) hours as needed for nausea (or headache). (Patient not taking: Reported on 05/27/2016), Disp: 6 tablet, Rfl: 0 .  ondansetron (ZOFRAN ODT) 4 MG disintegrating tablet, 4mg  ODT q4 hours prn nausea/vomit (Patient not taking: Reported on 09/23/2016), Disp: 12 tablet, Rfl: 0  Review of Systems  Constitutional: Positive for fatigue. Negative for appetite change, chills, diaphoresis, fever and unexpected weight change.  HENT: Positive for dental problem. Negative for congestion, drooling, ear pain, facial swelling, hearing loss, mouth sores, sneezing, sore throat, trouble swallowing and voice change.   Eyes: Positive for visual disturbance. Negative for pain, discharge, redness and itching.  Respiratory: Positive for shortness of breath. Negative for cough, choking and wheezing.   Cardiovascular: Positive for leg swelling. Negative for chest pain and palpitations.  Gastrointestinal: Positive for abdominal pain. Negative for blood in stool, constipation, diarrhea and vomiting.  Endocrine: Positive for cold intolerance and heat intolerance. Negative for polydipsia.  Genitourinary: Negative for decreased urine volume, dysuria and hematuria.  Musculoskeletal: Positive for arthralgias and gait problem. Negative for back  pain.  Skin: Negative for rash.  Allergic/Immunologic: Negative for environmental allergies.  Neurological: Positive for light-headedness and headaches. Negative for seizures and syncope.  Hematological: Negative for adenopathy.  Psychiatric/Behavioral: Positive for dysphoric mood. Negative for agitation and suicidal ideas. The patient is nervous/anxious.     Per HPI unless specifically indicated above     Objective:    BP 104/72 (BP Location: Left Arm, Patient Position: Sitting, Cuff Size: Large)   Pulse 73   Temp 97.7 F  (36.5 C) (Other (Comment))   Ht 5\' 5"  (1.651 m)   Wt 262 lb 8 oz (119.1 kg)   SpO2 97%   BMI 43.68 kg/m   Wt Readings from Last 3 Encounters:  09/23/16 262 lb 8 oz (119.1 kg)  08/05/16 270 lb (122.5 kg)  05/27/16 265 lb (120.2 kg)    Physical Exam  Constitutional: She is oriented to person, place, and time. She appears well-developed and well-nourished.  HENT:  Head: Normocephalic and atraumatic.  Mouth/Throat: Oropharynx is clear and moist. No oropharyngeal exudate.  Eyes: Pupils are equal, round, and reactive to light. Conjunctivae and EOM are normal.  Neck: Neck supple. No thyromegaly present.  Cardiovascular: Normal rate and regular rhythm.   Pulmonary/Chest: Effort normal and breath sounds normal.  Abdominal: Soft. Bowel sounds are normal. She exhibits no mass. There is no hepatosplenomegaly. There is no tenderness.  Musculoskeletal: She exhibits no edema.  Lymphadenopathy:    She has no cervical adenopathy.  Neurological: She is alert and oriented to person, place, and time. Gait normal.  Skin: Skin is warm and dry.  Psychiatric: She has a normal mood and affect. Her behavior is normal.  Vitals reviewed.       Assessment & Plan:   Encounter Diagnoses  Name Primary?  Marland Kitchen Anxiety Yes  . Prediabetes   . Chronic kidney disease, unspecified CKD stage   . Screening for colon cancer   . History of abnormal cervical Pap smear   . Hypokalemia   . Chronic bilateral low back pain without sciatica   . Hyperlipidemia, unspecified hyperlipidemia type   . Vitamin D deficiency   . Screening for breast cancer   . Morbid obesity ([place])       -pt to get fasting Labs tomorrow morning -pt was given ifobt for colon cancer screening -pt urged to go to [facility]. She is given [facility] card and counseled on their walk-in services -will do PAP at f/u OV -screening mammogram ordered -pt to follow up in 1 month.  RTO sooner prn

## 2016-09-23 NOTE — Patient Instructions (Signed)
Get bloodwork Stool test (poop test)_ Go to [facility]

## 2016-10-05 ENCOUNTER — Emergency Department (HOSPITAL_COMMUNITY): Payer: Self-pay

## 2016-10-05 ENCOUNTER — Encounter (HOSPITAL_COMMUNITY): Payer: Self-pay | Admitting: *Deleted

## 2016-10-05 ENCOUNTER — Emergency Department (HOSPITAL_COMMUNITY)
Admission: EM | Admit: 2016-10-05 | Discharge: 2016-10-05 | Disposition: A | Discharge status: Home / Self Care | Payer: Self-pay | Attending: Emergency Medicine | Admitting: Emergency Medicine

## 2016-10-05 DIAGNOSIS — Z7982 Long term (current) use of aspirin: Secondary | ICD-10-CM | POA: Insufficient documentation

## 2016-10-05 DIAGNOSIS — M542 Cervicalgia: Secondary | ICD-10-CM | POA: Insufficient documentation

## 2016-10-05 DIAGNOSIS — Z8541 Personal history of malignant neoplasm of cervix uteri: Secondary | ICD-10-CM | POA: Insufficient documentation

## 2016-10-05 DIAGNOSIS — M62838 Other muscle spasm: Secondary | ICD-10-CM | POA: Insufficient documentation

## 2016-10-05 DIAGNOSIS — M25511 Pain in right shoulder: Secondary | ICD-10-CM | POA: Insufficient documentation

## 2016-10-05 DIAGNOSIS — N183 Chronic kidney disease, stage 3 (moderate): Secondary | ICD-10-CM | POA: Insufficient documentation

## 2016-10-05 MED ORDER — HYDROCODONE-ACETAMINOPHEN 5-325 MG PO TABS
1.0000 | ORAL_TABLET | Freq: Once | ORAL | Status: AC
  Administered 2016-10-05: 1 via ORAL
  Filled 2016-10-05: qty 1

## 2016-10-05 MED ORDER — METHOCARBAMOL 500 MG PO TABS: 500.0000 mg | ORAL_TABLET | Freq: Three times a day (TID) | ORAL | 0 refills | Status: DC

## 2016-10-05 MED ORDER — TRAMADOL HCL 50 MG PO TABS: 50.0000 mg | ORAL_TABLET | Freq: Four times a day (QID) | ORAL | 0 refills | Status: DC | PRN

## 2016-10-05 MED ORDER — METHOCARBAMOL 500 MG PO TABS
500.0000 mg | ORAL_TABLET | Freq: Once | ORAL | Status: AC
  Administered 2016-10-05: 500 mg via ORAL
  Filled 2016-10-05: qty 1

## 2016-10-05 MED ORDER — PREDNISONE 10 MG PO TABS: ORAL_TABLET | ORAL | 0 refills | Status: DC

## 2016-10-05 NOTE — ED Provider Notes (Signed)
[facility] DEPT Provider Note   CSN: [id number] Arrival date & time: 10/05/16  1605     History   Chief Complaint Chief Complaint  Patient presents with  . Arm Pain    HPI Kristi Martin is a 46 y.o. female.  HPI   Kristi Martin is a 46 y.o. female who presents to the Emergency Department complaining of right arm pain for 2 weeks. She describes a sharp, tingling sensation that first began in her right shoulder and now radiates to her fingers and right side of her neck at various times.  Symptoms are associated with outward movement of the right arm and gripping objects.  She has tried ASA, topical muscle creams, ibuprofen and tylenol without relief.  Pain improves with arm held to her side and stationary.  She denies numbness, skin changes, headaches, facial symptoms and visual changes.   Past Medical History:  Diagnosis Date  . Anemia   . Cancer ([place])    cervical cx at age 37  . Cervical cancer ([place])   . Chronic back pain   . Chronic kidney disease   . CKD (chronic kidney disease), symptom management only, stage 3 (moderate)   . DDD (degenerative disc disease), lumbar    Facet arthritis  . Hypotension   . Left knee pain   . Migraine   . Peripheral neuropathy   . Sciatica   . Vaginal Pap smear, abnormal     Patient Active Problem List   Diagnosis Date Noted  . Substance abuse 05/27/2016  . MDD (major depressive disorder) 06/08/2015  . GAD (generalized anxiety disorder) 02/07/2015  . DDD (degenerative disc disease), lumbar 10/10/2014  . Facet arthropathy, lumbar ([place]) 10/10/2014  . HGSIL (high grade squamous intraepithelial lesion) on Pap smear of cervix 08/04/2014  . Chronic back pain 07/27/2014  . GERD (gastroesophageal reflux disease) 07/27/2014  . Peripheral neuropathy 06/28/2014  . Obesity 06/28/2014  . Glucose intolerance (impaired glucose tolerance) 04/03/2014  . Chronic anemia 04/01/2014  . CKD (chronic kidney disease) stage 3, GFR 30-59 ml/min  04/01/2014    Past Surgical History:  Procedure Laterality Date  . CHOLECYSTECTOMY N/A 01/20/2013   Procedure: LAPAROSCOPIC CHOLECYSTECTOMY WITH INTRAOPERATIVE CHOLANGIOGRAM;  Surgeon: Shann Medal, MD;  Location: [facility];  Service: General;  Laterality: N/A;  . TUBAL LIGATION      OB History    Gravida Para Term Preterm AB Living   9 7 5 2 2 7    SAB TAB Ectopic Multiple Live Births   2       1       Home Medications    Prior to Admission medications   Medication Sig Start Date End Date Taking? Authorizing Provider  aspirin EC 81 MG tablet Take 81 mg by mouth daily.    [provider]  cephALEXin (KEFLEX) 500 MG capsule Take 1 capsule (500 mg total) by mouth 4 (four) times daily. Patient not taking: Reported on 09/23/2016 08/05/16   Milton Ferguson, MD  FLUoxetine (PROZAC) 20 MG capsule Take 1 capsule (20 mg total) by mouth daily. Patient not taking: Reported on 09/23/2016 05/29/16   Alycia Rossetti, MD  metoCLOPramide (REGLAN) 10 MG tablet Take 1 tablet (10 mg total) by mouth every 6 (six) hours as needed for nausea (or headache). Patient not taking: Reported on 05/27/2016 05/22/16   Francine Graven, DO  ondansetron (ZOFRAN ODT) 4 MG disintegrating tablet 4mg  ODT q4 hours prn nausea/vomit Patient not taking: Reported on 09/23/2016 08/05/16  Milton Ferguson, MD    Family History Family History  Problem Relation Age of Onset  . Cancer Mother   . Colon cancer Mother   . Breast cancer Maternal Grandmother   . Heart disease Maternal Grandmother   . Breast cancer Paternal Grandmother   . Heart disease Paternal Grandmother   . Hypertension Paternal Grandmother   . Alcohol abuse Father   . Drug abuse Father   . Heart disease Father   . Asthma Brother   . Cancer Paternal Grandfather     Social History Social History  Substance Use Topics  . Smoking status: Never Smoker  . Smokeless tobacco: Never Used  . Alcohol use No     Allergies   Benadryl [diphenhydramine  hcl] and Onion   Review of Systems Review of Systems  Constitutional: Negative for chills and fever.  HENT: Negative for trouble swallowing.   Respiratory: Negative for cough, chest tightness and shortness of breath.   Cardiovascular: Negative for chest pain.  Gastrointestinal: Negative for nausea and vomiting.  Genitourinary: Negative for difficulty urinating and dysuria.  Musculoskeletal: Positive for arthralgias (right shoulder pain) and neck pain. Negative for joint swelling.  Skin: Negative for color change and wound.  Neurological: Negative for dizziness, syncope, facial asymmetry, weakness, numbness and headaches.  All other systems reviewed and are negative.    Physical Exam Updated Vital Signs BP 126/74   Pulse 84   Temp 98.1 F (36.7 C) (Oral)   Resp 19   Ht 5\' 5"  (1.651 m)   Wt 118.8 kg (262 lb)   LMP 09/28/2016   SpO2 97%   BMI 43.60 kg/m   Physical Exam  Constitutional: She is oriented to person, place, and time. She appears well-developed and well-nourished. No distress.  HENT:  Head: Normocephalic and atraumatic.  Mouth/Throat: Oropharynx is clear and moist.  Eyes: Pupils are equal, round, and reactive to light. EOM are normal.  Neck: Normal range of motion.  Cardiovascular: Normal rate, regular rhythm and intact distal pulses.   No murmur heard. Pulmonary/Chest: Effort normal and breath sounds normal. No respiratory distress. She exhibits no tenderness.  Musculoskeletal: She exhibits tenderness. She exhibits no edema.  ttp of the right AC joint, trapezius muscles and forearm muscles.  No erythema or edema.  Pain reproduced on ROM of the shoulder.  Grip strength is strong and symmetrical  Neurological: She is alert and oriented to person, place, and time. No sensory deficit. She exhibits normal muscle tone. Coordination normal.  Skin: Skin is warm and dry. Capillary refill takes less than 2 seconds.  Psychiatric: She has a normal mood and affect.  Nursing  note and vitals reviewed.    ED Treatments / Results  Labs (all labs ordered are listed, but only abnormal results are displayed) Labs Reviewed - No data to display  EKG  EKG Interpretation None       Radiology Dg Shoulder Right  Result Date: 10/05/2016 CLINICAL DATA:  Right shoulder pain radiating to the fingers. EXAM: RIGHT SHOULDER - 2+ VIEW COMPARISON:  None. FINDINGS: There is no evidence of fracture or dislocation. There is no evidence of arthropathy or other focal bone abnormality. Soft tissues are unremarkable. IMPRESSION: Negative. Electronically Signed   By: Abelardo Diesel M.D.   On: 10/05/2016 18:07   Ct Cervical Spine Wo Contrast  Result Date: 10/05/2016 CLINICAL DATA:  Pain of right shoulder radiating to the fingers for 2 weeks. EXAM: CT CERVICAL SPINE WITHOUT CONTRAST TECHNIQUE: Multidetector CT imaging of  the cervical spine was performed without intravenous contrast. Multiplanar CT image reconstructions were also generated. COMPARISON:  August 05, 2016 FINDINGS: Alignment: There is straightening of cervical spine. This can be seen due to muscle spasm or positioning. Skull base and vertebrae: No acute fracture. No primary bone lesion or focal pathologic process. Soft tissues and spinal canal: No prevertebral fluid or swelling. No visible canal hematoma. Disc levels: The intervertebral spaces are normal. No significant facet joint degenerative joint changes are noted. Upper chest: Negative. Other: None. IMPRESSION: No acute abnormality identified in the cervical spine. Straightening of cervical spine which can be seen due to muscle spasm or positioning. No significant degenerative joint changes are identified on CT. Electronically Signed   By: Abelardo Diesel M.D.   On: 10/05/2016 18:15    Procedures Procedures (including critical care time)  Medications Ordered in ED Medications - No data to display   Initial Impression / Assessment and Plan / ED Course  I have reviewed the  triage vital signs and the nursing notes.  Pertinent labs & imaging results that were available during my care of the patient were reviewed by me and considered in my medical decision making (see chart for details).     Pt reviewed on the [place], last rec'd hydrocodone on 04/29/16. No other medications on file  NV intact.  No concerning sx's for infectious process.  No skin changes.  CT and XRs are reassuring.  Likely bursitis.  Pt appears stable for d/c, referral given for orthopedics.  Pain improved after medication  Final Clinical Impressions(s) / ED Diagnoses   Final diagnoses:  Spasm of cervical paraspinous muscle  Acute pain of right shoulder    New Prescriptions New Prescriptions   No medications on file     Kem Parkinson, PA-C 10/05/16 [place], [place], DO 10/08/16 1949

## 2016-10-05 NOTE — ED Triage Notes (Signed)
Pt c/o pain from right shoulder that radiates down arm into fingers x 2 weeks. Denies injury.

## 2016-10-05 NOTE — Discharge Instructions (Signed)
Continue to alternat ice packs on/off.  Follow-up with your PCP or call Dr. Ruthe Mannan office to arrange a follow-up appt if not improving

## 2016-10-23 ENCOUNTER — Ambulatory Visit: Payer: Self-pay | Admitting: Physician Assistant

## 2016-10-23 ENCOUNTER — Other Ambulatory Visit (HOSPITAL_COMMUNITY)
Admission: RE | Admit: 2016-10-23 | Discharge: 2016-10-23 | Disposition: A | Discharge status: Home / Self Care | Payer: Self-pay | Source: Ambulatory Visit | Attending: Physician Assistant | Admitting: Physician Assistant

## 2016-10-23 ENCOUNTER — Encounter: Payer: Self-pay | Admitting: Physician Assistant

## 2016-10-23 VITALS — BP 98/70 | HR 80 | Temp 97.7°F | Wt 268.0 lb

## 2016-10-23 DIAGNOSIS — N3090 Cystitis, unspecified without hematuria: Secondary | ICD-10-CM | POA: Insufficient documentation

## 2016-10-23 DIAGNOSIS — R3915 Urgency of urination: Secondary | ICD-10-CM | POA: Insufficient documentation

## 2016-10-23 DIAGNOSIS — Z01411 Encounter for gynecological examination (general) (routine) with abnormal findings: Secondary | ICD-10-CM | POA: Insufficient documentation

## 2016-10-23 DIAGNOSIS — R319 Hematuria, unspecified: Secondary | ICD-10-CM | POA: Insufficient documentation

## 2016-10-23 DIAGNOSIS — Z124 Encounter for screening for malignant neoplasm of cervix: Secondary | ICD-10-CM

## 2016-10-23 LAB — POCT URINALYSIS DIPSTICK
BILIRUBIN UA: NEGATIVE
GLUCOSE UA: NEGATIVE
KETONES UA: NEGATIVE
Nitrite, UA: NEGATIVE
SPEC GRAV UA: 1.02 (ref 1.010–1.025)
UROBILINOGEN UA: 0.2 U/dL
pH, UA: 5.5 (ref 5.0–8.0)

## 2016-10-23 MED ORDER — SULFAMETHOXAZOLE-TRIMETHOPRIM 800-160 MG PO TABS: 1.0000 | ORAL_TABLET | Freq: Two times a day (BID) | ORAL | 0 refills | Status: AC

## 2016-10-23 NOTE — Patient Instructions (Signed)
GET FASTING BLOOD/LABS

## 2016-10-23 NOTE — Progress Notes (Signed)
BP 98/70 (BP Location: Left Arm, Patient Position: Sitting, Cuff Size: Normal)   Pulse 80   Temp 97.7 F (36.5 C)   Wt 268 lb (121.6 kg)   LMP 10/17/2016 (Exact Date)   SpO2 97%   BMI 44.60 kg/m    Subjective:    Patient ID: Kristi Martin, female    DOB: 05/20/1970, 46 y.o.   MRN: [id number]  HPI: Kristi Martin is a 46 y.o. female presenting on 10/23/2016 for Gynecologic Exam and Follow-up   HPI   Pt is going to [facility] now.  She is feeling much better  Pt having some urinary incontenence.  Feels the urge and then it starts and she can't stop it.    Pt forgot to get her labs drawn   Relevant past medical, surgical, family and social history reviewed and updated as indicated. Interim medical history since our last visit reviewed. Allergies and medications reviewed and updated.   Current Outpatient Prescriptions:  .  aspirin EC 81 MG tablet, Take 81 mg by mouth daily., Disp: , Rfl:  .  citalopram (CELEXA) 20 MG tablet, Take 20 mg by mouth daily., Disp: , Rfl:  .  traZODone (DESYREL) 100 MG tablet, Take 200 mg by mouth at bedtime., Disp: , Rfl:   Review of Systems  Constitutional: Positive for appetite change, chills, diaphoresis and fatigue. Negative for fever and unexpected weight change.  HENT: Positive for dental problem. Negative for congestion, drooling, ear pain, facial swelling, hearing loss, mouth sores, sneezing, sore throat, trouble swallowing and voice change.   Eyes: Positive for pain, itching and visual disturbance. Negative for discharge and redness.  Respiratory: Positive for shortness of breath. Negative for cough, choking and wheezing.   Cardiovascular: Positive for leg swelling. Negative for chest pain and palpitations.  Gastrointestinal: Positive for abdominal pain. Negative for blood in stool, constipation, diarrhea and vomiting.  Endocrine: Positive for cold intolerance, heat intolerance and polydipsia.  Genitourinary: Negative for decreased  urine volume, dysuria and hematuria.  Musculoskeletal: Positive for arthralgias, back pain and gait problem.  Skin: Negative for rash.  Allergic/Immunologic: Negative for environmental allergies.  Neurological: Positive for headaches. Negative for seizures, syncope and light-headedness.  Hematological: Negative for adenopathy.  Psychiatric/Behavioral: Positive for dysphoric mood. Negative for agitation and suicidal ideas. The patient is nervous/anxious.     Per HPI unless specifically indicated above     Objective:    BP 98/70 (BP Location: Left Arm, Patient Position: Sitting, Cuff Size: Normal)   Pulse 80   Temp 97.7 F (36.5 C)   Wt 268 lb (121.6 kg)   LMP 10/17/2016 (Exact Date)   SpO2 97%   BMI 44.60 kg/m   Wt Readings from Last 3 Encounters:  10/23/16 268 lb (121.6 kg)  10/05/16 262 lb (118.8 kg)  09/23/16 262 lb 8 oz (119.1 kg)    Physical Exam  Constitutional: She is oriented to person, place, and time. She appears well-developed and well-nourished.  Pulmonary/Chest: Effort normal.  Breast exam normal  Abdominal: Soft. She exhibits no mass. There is no tenderness. There is no rebound and no guarding.  Genitourinary: Vagina normal and uterus normal. No breast swelling, tenderness, discharge or bleeding. There is no rash, tenderness or lesion on the right labia. There is no rash, tenderness or lesion on the left labia. Cervix exhibits no motion tenderness, no discharge and no friability. Right adnexum displays no mass, no tenderness and no fullness. Left adnexum displays no mass, no tenderness and no  fullness.  Genitourinary Comments: (nurse Berenice assisted)  Neurological: She is alert and oriented to person, place, and time.  Skin: Skin is warm and dry.  Psychiatric: She has a normal mood and affect. Her behavior is normal.  Nursing note and vitals reviewed.   UA reviewed     Assessment & Plan:   Encounter Diagnoses  Name Primary?  . Pap smear for cervical  cancer screening Yes  . Urinary urgency   . Cystitis   . Hematuria, unspecified type     -rx septra -Will cx urine -pt to Get labs drawn -pt to follow up 1 month. RTO sooner prn

## 2016-10-25 LAB — URINE CULTURE

## 2016-10-27 ENCOUNTER — Emergency Department (HOSPITAL_COMMUNITY): Payer: Self-pay

## 2016-10-27 ENCOUNTER — Emergency Department (HOSPITAL_COMMUNITY)
Admission: EM | Admit: 2016-10-27 | Discharge: 2016-10-27 | Disposition: A | Discharge status: Home / Self Care | Payer: Self-pay | Attending: Emergency Medicine | Admitting: Emergency Medicine

## 2016-10-27 ENCOUNTER — Encounter (HOSPITAL_COMMUNITY): Payer: Self-pay | Admitting: Emergency Medicine

## 2016-10-27 ENCOUNTER — Other Ambulatory Visit: Payer: Self-pay

## 2016-10-27 DIAGNOSIS — N183 Chronic kidney disease, stage 3 (moderate): Secondary | ICD-10-CM | POA: Insufficient documentation

## 2016-10-27 DIAGNOSIS — K219 Gastro-esophageal reflux disease without esophagitis: Secondary | ICD-10-CM | POA: Insufficient documentation

## 2016-10-27 DIAGNOSIS — Z79899 Other long term (current) drug therapy: Secondary | ICD-10-CM | POA: Insufficient documentation

## 2016-10-27 DIAGNOSIS — Z7982 Long term (current) use of aspirin: Secondary | ICD-10-CM | POA: Insufficient documentation

## 2016-10-27 LAB — CBC
HEMATOCRIT: 36.6 % (ref 36.0–46.0)
HEMOGLOBIN: 11.9 g/dL — AB (ref 12.0–15.0)
MCH: 27.2 pg (ref 26.0–34.0)
MCHC: 32.5 g/dL (ref 30.0–36.0)
MCV: 83.6 fL (ref 78.0–100.0)
Platelets: 243 10*3/uL (ref 150–400)
RBC: 4.38 MIL/uL (ref 3.87–5.11)
RDW: 14.6 % (ref 11.5–15.5)
WBC: 5.6 10*3/uL (ref 4.0–10.5)

## 2016-10-27 LAB — BASIC METABOLIC PANEL
ANION GAP: 8 (ref 5–15)
BUN: 14 mg/dL (ref 6–20)
CO2: 24 mmol/L (ref 22–32)
Calcium: 8.5 mg/dL — ABNORMAL LOW (ref 8.9–10.3)
Chloride: 106 mmol/L (ref 101–111)
Creatinine, Ser: 1.03 mg/dL — ABNORMAL HIGH (ref 0.44–1.00)
GFR calc Af Amer: >60 mL/min (ref >60)
Glucose, Bld: 98 mg/dL (ref 65–99)
POTASSIUM: 3.8 mmol/L (ref 3.5–5.1)
Sodium: 138 mmol/L (ref 135–145)

## 2016-10-27 LAB — TROPONIN I

## 2016-10-27 LAB — HEPATIC FUNCTION PANEL
ALT: 10 U/L — AB (ref 14–54)
AST: 14 U/L — AB (ref 15–41)
Albumin: 3.2 g/dL — ABNORMAL LOW (ref 3.5–5.0)
Alkaline Phosphatase: 73 U/L (ref 38–126)
BILIRUBIN INDIRECT: 0.3 mg/dL (ref 0.3–0.9)
Bilirubin, Direct: 0.1 mg/dL (ref 0.1–0.5)
TOTAL PROTEIN: 6.5 g/dL (ref 6.5–8.1)
Total Bilirubin: 0.4 mg/dL (ref 0.3–1.2)

## 2016-10-27 LAB — LIPASE, BLOOD: LIPASE: 30 U/L (ref 11–51)

## 2016-10-27 MED ORDER — FAMOTIDINE 20 MG PO TABS
40.0000 mg | ORAL_TABLET | Freq: Once | ORAL | Status: AC
  Administered 2016-10-27: 40 mg via ORAL
  Filled 2016-10-27: qty 2

## 2016-10-27 MED ORDER — GI COCKTAIL ~~LOC~~
30.0000 mL | Freq: Once | ORAL | Status: AC
  Administered 2016-10-27: 30 mL via ORAL
  Filled 2016-10-27: qty 30

## 2016-10-27 MED ORDER — FAMOTIDINE 20 MG PO TABS: 20.0000 mg | ORAL_TABLET | Freq: Two times a day (BID) | ORAL | 0 refills | Status: DC

## 2016-10-27 NOTE — ED Triage Notes (Signed)
Patient c/o mid-sternal, non-radaiting chest paion that started last night. Patient states shortness of breath, nausea, lightheadedness, and diaphoresis. Denies any cardiac hx.

## 2016-10-27 NOTE — ED Provider Notes (Signed)
[facility] DEPT Provider Note   CSN: [id number] Arrival date & time: 10/27/16  1212     History   Chief Complaint Chief Complaint  Patient presents with  . Chest Pain    HPI Kristi Martin is a 46 y.o. female.  HPI  Pt was seen at 1430.  Per pt, c/o gradual onset and persistence of constant lower mid-sternal chest "pain" since last night. Pt states she ate mac and cheese, neck bones, spinach/cucumber salad with [place] dressing at 8pm; laid down at 10pm and her symptoms began.  Has been associated with belching. Endorses hx of GERD and "thinks that's what it is."  Denies N/V, no diarrhea, no fevers, no back pain, no rash, no CP/SOB, no black or blood in stools.      Past Medical History:  Diagnosis Date  . Anemia   . Cancer ([place])    cervical cx at age 51  . Cervical cancer ([place])   . Chronic back pain   . Chronic kidney disease   . CKD (chronic kidney disease), symptom management only, stage 3 (moderate)   . DDD (degenerative disc disease), lumbar    Facet arthritis  . Hypotension   . Left knee pain   . Migraine   . Peripheral neuropathy   . Sciatica   . Vaginal Pap smear, abnormal     Patient Active Problem List   Diagnosis Date Noted  . Substance abuse 05/27/2016  . MDD (major depressive disorder) 06/08/2015  . GAD (generalized anxiety disorder) 02/07/2015  . DDD (degenerative disc disease), lumbar 10/10/2014  . Facet arthropathy, lumbar ([place]) 10/10/2014  . HGSIL (high grade squamous intraepithelial lesion) on Pap smear of cervix 08/04/2014  . Chronic back pain 07/27/2014  . GERD (gastroesophageal reflux disease) 07/27/2014  . Peripheral neuropathy 06/28/2014  . Obesity 06/28/2014  . Glucose intolerance (impaired glucose tolerance) 04/03/2014  . Chronic anemia 04/01/2014  . CKD (chronic kidney disease) stage 3, GFR 30-59 ml/min 04/01/2014    Past Surgical History:  Procedure Laterality Date  . CHOLECYSTECTOMY N/A 01/20/2013   Procedure: LAPAROSCOPIC  CHOLECYSTECTOMY WITH INTRAOPERATIVE CHOLANGIOGRAM;  Surgeon: Shann Medal, MD;  Location: [facility];  Service: General;  Laterality: N/A;  . TUBAL LIGATION      OB History    Gravida Para Term Preterm AB Living   _0 SAB TAB Ectopic Multiple Live Births   2       1       Home Medications    Prior to Admission medications   Medication Sig Start Date End Date Taking? Authorizing Provider  aspirin EC 81 MG tablet Take 81 mg by mouth daily.    [provider]  citalopram (CELEXA) 20 MG tablet Take 20 mg by mouth daily.    [provider]  sulfamethoxazole-trimethoprim (BACTRIM DS,SEPTRA DS) 800-160 MG tablet Take 1 tablet by mouth 2 (two) times daily. 10/23/16 10/30/16  Soyla Dryer, PA-C  traZODone (DESYREL) 100 MG tablet Take 200 mg by mouth at bedtime.    [provider]    Family History Family History  Problem Relation Age of Onset  . Cancer Mother   . Colon cancer Mother   . Breast cancer Maternal Grandmother   . Heart disease Maternal Grandmother   . Breast cancer Paternal Grandmother   . Heart disease Paternal Grandmother   . Hypertension Paternal Grandmother   . Alcohol abuse Father   . Drug abuse Father   .  Heart disease Father   . Asthma Brother   . Cancer Paternal Grandfather     Social History Social History  Substance Use Topics  . Smoking status: Never Smoker  . Smokeless tobacco: Never Used  . Alcohol use 0.0 oz/week     Comment: occasional     Allergies   Benadryl [diphenhydramine hcl] and Onion   Review of Systems Review of Systems ROS: Statement: All systems negative except as marked or noted in the HPI; Constitutional: Negative for fever and chills. ; ; Eyes: Negative for eye pain, redness and discharge. ; ; ENMT: Negative for ear pain, hoarseness, nasal congestion, sinus pressure and sore throat. ; ; Cardiovascular: +lower chest pain. Negative for palpitations, diaphoresis, dyspnea and peripheral edema. ; ;  Respiratory: Negative for cough, wheezing and stridor. ; ; Gastrointestinal: Negative for nausea, vomiting, diarrhea, abdominal pain, blood in stool, hematemesis, jaundice and rectal bleeding. . ; ; Genitourinary: Negative for dysuria, flank pain and hematuria. ; ; Musculoskeletal: Negative for back pain and neck pain. Negative for swelling and trauma.; ; Skin: Negative for pruritus, rash, abrasions, blisters, bruising and skin lesion.; ; Neuro: Negative for headache, lightheadedness and neck stiffness. Negative for weakness, altered level of consciousness, altered mental status, extremity weakness, paresthesias, involuntary movement, seizure and syncope.       Physical Exam Updated Vital Signs BP (!) 105/58   Pulse 78   Temp 98 F (36.7 C)   Resp 18   Ht _0  (1.651 m)   Wt 121.6 kg (268 lb)   LMP 10/17/2016 (Exact Date)   SpO2 100%   BMI 44.60 kg/m   Physical Exam 1435: Physical examination:  Nursing notes reviewed; Vital signs and O2 SAT reviewed;  Constitutional: Well developed, Well nourished, Well hydrated, In no acute distress; Head:  Normocephalic, atraumatic; Eyes: EOMI, PERRL, No scleral icterus; ENMT: Mouth and pharynx normal, Mucous membranes moist; Neck: Supple, Full range of motion, No lymphadenopathy; Cardiovascular: Regular rate and rhythm, No gallop; Respiratory: Breath sounds clear & equal bilaterally, No wheezes.  Speaking full sentences with ease, Normal respiratory effort/excursion; Chest: Nontender, Movement normal; Abdomen: Soft, Nontender, Nondistended, Normal bowel sounds; Genitourinary: No CVA tenderness; Extremities: Pulses normal, No tenderness, No edema, No calf edema or asymmetry.; Neuro: AA&Ox3, Major CN grossly intact.  Speech clear. No gross focal motor or sensory deficits in extremities.; Skin: Color normal, Warm, Dry.   ED Treatments / Results  Labs (all labs ordered are listed, but only abnormal results are displayed)   EKG  EKG  Interpretation  Date/Time:  Monday October 27 2016 12:25:07 EDT Ventricular Rate:  79 PR Interval:  164 QRS Duration: 80 QT Interval:  394 QTC Calculation: 451 R Axis:   27 Text Interpretation:  Normal sinus rhythm Normal ECG When compared with ECG of 08/05/2016 No significant change was found Confirmed by Francine Graven [id number]) on 10/27/2016 2:35:32 PM       Radiology   Procedures Procedures (including critical care time)  Medications Ordered in ED Medications  famotidine (PEPCID) tablet 40 mg (not administered)  gi cocktail (Maalox,Lidocaine,Donnatal) (not administered)     Initial Impression / Assessment and Plan / ED Course  I have reviewed the triage vital signs and the nursing notes.  Pertinent labs & imaging results that were available during my care of the patient were reviewed by me and considered in my medical decision making (see chart for details).  MDM Reviewed: previous chart, nursing note and vitals Reviewed previous: labs and ECG Interpretation:  labs, ECG and x-ray   Results for orders placed or performed during the hospital encounter of 83/15/17  Basic metabolic panel  Result Value Ref Range   Sodium 138 135 - 145 mmol/L   Potassium 3.8 3.5 - 5.1 mmol/L   Chloride 106 101 - 111 mmol/L   CO2 24 22 - 32 mmol/L   Glucose, Bld 98 65 - 99 mg/dL   BUN 14 6 - 20 mg/dL   Creatinine, Ser 1.03 (H) 0.44 - 1.00 mg/dL   Calcium 8.5 (L) 8.9 - 10.3 mg/dL   GFR calc non Af Amer >60 >60 mL/min   GFR calc Af Amer >60 >60 mL/min   Anion gap 8 5 - 15  CBC  Result Value Ref Range   WBC 5.6 4.0 - 10.5 K/uL   RBC 4.38 3.87 - 5.11 MIL/uL   Hemoglobin 11.9 (L) 12.0 - 15.0 g/dL   HCT 36.6 36.0 - 46.0 %   MCV 83.6 78.0 - 100.0 fL   MCH 27.2 26.0 - 34.0 pg   MCHC 32.5 30.0 - 36.0 g/dL   RDW 14.6 11.5 - 15.5 %   Platelets 243 150 - 400 K/uL  Troponin I  Result Value Ref Range   Troponin I <0.03 <0.03 ng/mL  Lipase, blood  Result Value Ref Range   Lipase 30 11  - 51 U/L  Hepatic function panel  Result Value Ref Range   Total Protein 6.5 6.5 - 8.1 g/dL   Albumin 3.2 (L) 3.5 - 5.0 g/dL   AST 14 (L) 15 - 41 U/L   ALT 10 (L) 14 - 54 U/L   Alkaline Phosphatase 73 38 - 126 U/L   Total Bilirubin 0.4 0.3 - 1.2 mg/dL   Bilirubin, Direct 0.1 0.1 - 0.5 mg/dL   Indirect Bilirubin 0.3 0.3 - 0.9 mg/dL   Dg Chest 2 View Result Date: 10/27/2016 CLINICAL DATA:  Mid chest pain and shortness of since last night. EXAM: CHEST  2 VIEW COMPARISON:  PA and lateral chest x-ray of August 05, 2016 FINDINGS: The lungs are mildly hypoinflated but clear. The heart and pulmonary vascularity are normal. The mediastinum is normal in width. The trachea is midline. There is no pleural effusion. The bony thorax exhibits no acute abnormality. IMPRESSION: There is no acute cardiopulmonary abnormality. Electronically Signed   By: David  Martinique M.D.   On: 10/27/2016 12:45    1610:  Pt has tol PO well while in the ED without N/V.  No stooling while in the ED.  Abd remains benign, VSS. Feels better after meds and wants to go home now. Doubt PE as cause for symptoms with low risk [place].  Doubt ACS as cause for symptoms with normal troponin and unchanged EKG from previous after 14+ hours of constant symptoms. Tx symptomatically at this time. Dx and testing d/w pt.  Questions answered.  Verb understanding, agreeable to d/c home with outpt f/u.     Final Clinical Impressions(s) / ED Diagnoses   Final diagnoses:  None    New Prescriptions New Prescriptions   No medications on file     Francine Graven, DO 10/31/16 1651

## 2016-10-27 NOTE — Discharge Instructions (Signed)
Eat a bland diet, avoiding greasy, fatty, fried foods, as well as spicy and acidic foods or beverages.  Avoid eating within 2 to 3 hours before going to bed or laying down.  Also avoid teas, colas, coffee, chocolate, pepermint and spearment.  Take the prescription as directed. May also take over the counter maalox/mylanta, as directed on packaging, as needed for discomfort.  Call your regular medical doctor tomorrow to schedule a follow up appointment this week. Call the GI doctor tomorrow to schedule a follow up appointment within the next week. Return to the Emergency Department immediately if worsening.

## 2016-11-12 ENCOUNTER — Other Ambulatory Visit: Payer: Self-pay | Admitting: Physician Assistant

## 2016-11-12 DIAGNOSIS — Z1231 Encounter for screening mammogram for malignant neoplasm of breast: Secondary | ICD-10-CM

## 2016-11-17 ENCOUNTER — Ambulatory Visit: Payer: Self-pay | Admitting: Physician Assistant

## 2016-12-03 ENCOUNTER — Ambulatory Visit: Payer: Self-pay | Admitting: Physician Assistant

## 2016-12-03 ENCOUNTER — Encounter: Payer: Self-pay | Admitting: Physician Assistant

## 2016-12-03 VITALS — BP 108/78 | HR 77 | Temp 97.5°F | Ht 65.0 in | Wt 263.0 lb

## 2016-12-03 DIAGNOSIS — F419 Anxiety disorder, unspecified: Secondary | ICD-10-CM

## 2016-12-03 DIAGNOSIS — R87611 Atypical squamous cells cannot exclude high grade squamous intraepithelial lesion on cytologic smear of cervix (ASC-H): Secondary | ICD-10-CM

## 2016-12-03 DIAGNOSIS — Z9119 Patient's noncompliance with other medical treatment and regimen: Secondary | ICD-10-CM

## 2016-12-03 DIAGNOSIS — Z91199 Patient's noncompliance with other medical treatment and regimen due to unspecified reason: Secondary | ICD-10-CM

## 2016-12-03 DIAGNOSIS — K219 Gastro-esophageal reflux disease without esophagitis: Secondary | ICD-10-CM

## 2016-12-03 MED ORDER — FAMOTIDINE 20 MG PO TABS: 20.0000 mg | ORAL_TABLET | Freq: Two times a day (BID) | ORAL | 3 refills | Status: DC | PRN

## 2016-12-03 NOTE — Progress Notes (Signed)
BP 108/78 (BP Location: Left Arm, Patient Position: Sitting, Cuff Size: Large)   Pulse 77   Temp (!) 97.5 F (36.4 C)   Ht 5\' 5"  (1.651 m)   Wt 263 lb (119.3 kg)   LMP 10/05/2016 (Approximate)   SpO2 98%   BMI 43.77 kg/m    Subjective:    Patient ID: Kristi Martin, female    DOB: 01/12/1971, 46 y.o.   MRN: [id number]  HPI: Kristi Martin is a 46 y.o. female presenting on 12/03/2016 for Follow-up   HPI She is still going to [facility].  she Says that it has significantly improved her life  Pt has still not gotten labs drawn- they were supposed to be done in august.  She also has not returned her iFOBT.  Pt was called about abnormal PAP since her last OV.   Pt says pepcid works well for her gerd but she is out.   Pt says she talked to a lady about a mammogram appointment and they are supposed to call her back later today.    Relevant past medical, surgical, family and social history reviewed and updated as indicated. Interim medical history since our last visit reviewed. Allergies and medications reviewed and updated.   Current Outpatient Prescriptions:  .  aspirin EC 81 MG tablet, Take 81 mg by mouth daily., Disp: , Rfl:  .  citalopram (CELEXA) 20 MG tablet, Take 20 mg by mouth daily., Disp: , Rfl:  .  traZODone (DESYREL) 100 MG tablet, Take 200 mg by mouth at bedtime., Disp: , Rfl:  .  famotidine (PEPCID) 20 MG tablet, Take 1 tablet (20 mg total) by mouth 2 (two) times daily. (Patient not taking: Reported on 12/03/2016), Disp: 30 tablet, Rfl: 0   Review of Systems  Constitutional: Positive for chills and fatigue. Negative for appetite change, diaphoresis, fever and unexpected weight change.  HENT: Positive for dental problem and sneezing. Negative for congestion, drooling, ear pain, facial swelling, hearing loss, mouth sores, trouble swallowing and voice change.   Eyes: Negative for pain, discharge, redness, itching and visual disturbance.  Respiratory: Negative for  cough, choking, shortness of breath and wheezing.   Cardiovascular: Positive for leg swelling. Negative for chest pain and palpitations.  Gastrointestinal: Negative for abdominal pain, blood in stool, constipation, diarrhea and vomiting.  Endocrine: Positive for cold intolerance. Negative for heat intolerance and polydipsia.  Genitourinary: Negative for decreased urine volume, dysuria and hematuria.  Musculoskeletal: Positive for arthralgias and back pain. Negative for gait problem.  Skin: Negative for rash.  Allergic/Immunologic: Negative for environmental allergies.  Neurological: Negative for seizures, syncope, light-headedness and headaches.  Hematological: Negative for adenopathy.  Psychiatric/Behavioral: Positive for dysphoric mood. Negative for agitation and suicidal ideas. The patient is not nervous/anxious.     Per HPI unless specifically indicated above     Objective:    BP 108/78 (BP Location: Left Arm, Patient Position: Sitting, Cuff Size: Large)   Pulse 77   Temp (!) 97.5 F (36.4 C)   Ht 5\' 5"  (1.651 m)   Wt 263 lb (119.3 kg)   LMP 10/05/2016 (Approximate)   SpO2 98%   BMI 43.77 kg/m   Wt Readings from Last 3 Encounters:  12/03/16 263 lb (119.3 kg)  10/27/16 268 lb (121.6 kg)  10/23/16 268 lb (121.6 kg)    Physical Exam  Constitutional: She is oriented to person, place, and time. She appears well-developed and well-nourished.  HENT:  Head: Normocephalic and atraumatic.  Neck: Neck  supple.  Cardiovascular: Normal rate and regular rhythm.   Pulmonary/Chest: Effort normal and breath sounds normal.  Abdominal: Soft. Bowel sounds are normal. She exhibits no mass. There is no hepatosplenomegaly. There is no tenderness.  Musculoskeletal: She exhibits no edema.  Lymphadenopathy:    She has no cervical adenopathy.  Neurological: She is alert and oriented to person, place, and time.  Skin: Skin is warm and dry.  Psychiatric: She has a normal mood and affect. Her  behavior is normal.  Vitals reviewed.        Assessment & Plan:   Encounter Diagnoses  Name Primary?  . Gastroesophageal reflux disease, esophagitis presence not specified Yes  . Atypical squamous cells cannot exclude high grade squamous intraepithelial lesion on cytologic smear of cervix (ASC-H)   . Noncompliance with diagnostic testing   . Anxiety   . Morbid obesity ([place])     -Pt referred to gyn for abn pap-  -pt was given [facility] applicaton -pt to get fasting labs drawn (ordered in august and pt has still not gone yet). We will call here with results -pt reminded to return iFOBT test for colon cancer screening -pt to continue with [facility] for MH counseling.   -pepcid refilled -pt to follow up in 4 months. RTO sooner prn

## 2016-12-04 ENCOUNTER — Encounter: Payer: Self-pay | Admitting: Physician Assistant

## 2016-12-09 ENCOUNTER — Emergency Department (HOSPITAL_COMMUNITY)
Admission: EM | Admit: 2016-12-09 | Discharge: 2016-12-09 | Disposition: A | Discharge status: Home / Self Care | Payer: Self-pay | Attending: Emergency Medicine | Admitting: Emergency Medicine

## 2016-12-09 ENCOUNTER — Emergency Department (HOSPITAL_COMMUNITY): Payer: Self-pay

## 2016-12-09 ENCOUNTER — Other Ambulatory Visit: Payer: Self-pay

## 2016-12-09 ENCOUNTER — Encounter (HOSPITAL_COMMUNITY): Payer: Self-pay | Admitting: *Deleted

## 2016-12-09 DIAGNOSIS — N183 Chronic kidney disease, stage 3 (moderate): Secondary | ICD-10-CM | POA: Insufficient documentation

## 2016-12-09 DIAGNOSIS — Z79899 Other long term (current) drug therapy: Secondary | ICD-10-CM | POA: Insufficient documentation

## 2016-12-09 DIAGNOSIS — R1110 Vomiting, unspecified: Secondary | ICD-10-CM | POA: Insufficient documentation

## 2016-12-09 DIAGNOSIS — R102 Pelvic and perineal pain: Secondary | ICD-10-CM | POA: Insufficient documentation

## 2016-12-09 DIAGNOSIS — Z8541 Personal history of malignant neoplasm of cervix uteri: Secondary | ICD-10-CM | POA: Insufficient documentation

## 2016-12-09 LAB — URINALYSIS, ROUTINE W REFLEX MICROSCOPIC
Bilirubin Urine: NEGATIVE
Glucose, UA: NEGATIVE mg/dL
KETONES UR: NEGATIVE mg/dL
Leukocytes, UA: NEGATIVE
NITRITE: NEGATIVE
PROTEIN: 100 mg/dL — AB
SPECIFIC GRAVITY, URINE: 1.014 (ref 1.005–1.030)
pH: 8 (ref 5.0–8.0)

## 2016-12-09 LAB — COMPREHENSIVE METABOLIC PANEL
ALT: 9 U/L — AB (ref 14–54)
AST: 15 U/L (ref 15–41)
Albumin: 2.9 g/dL — ABNORMAL LOW (ref 3.5–5.0)
Alkaline Phosphatase: 64 U/L (ref 38–126)
Anion gap: 9 (ref 5–15)
BUN: 11 mg/dL (ref 6–20)
CHLORIDE: 106 mmol/L (ref 101–111)
CO2: 27 mmol/L (ref 22–32)
CREATININE: 1.31 mg/dL — AB (ref 0.44–1.00)
Calcium: 8.2 mg/dL — ABNORMAL LOW (ref 8.9–10.3)
GFR, EST AFRICAN AMERICAN: 55 mL/min — AB (ref >60)
GFR, EST NON AFRICAN AMERICAN: 47 mL/min — AB (ref >60)
Glucose, Bld: 69 mg/dL (ref 65–99)
Potassium: 3.5 mmol/L (ref 3.5–5.1)
Sodium: 142 mmol/L (ref 135–145)
Total Bilirubin: 0.3 mg/dL (ref 0.3–1.2)
Total Protein: 5.9 g/dL — ABNORMAL LOW (ref 6.5–8.1)

## 2016-12-09 LAB — CBC WITH DIFFERENTIAL/PLATELET
BASOS ABS: 0 10*3/uL (ref 0.0–0.1)
BASOS PCT: 1 %
EOS ABS: 0.1 10*3/uL (ref 0.0–0.7)
EOS PCT: 2 %
HEMATOCRIT: 36.2 % (ref 36.0–46.0)
Hemoglobin: 11.6 g/dL — ABNORMAL LOW (ref 12.0–15.0)
LYMPHS PCT: 37 %
Lymphs Abs: 2 10*3/uL (ref 0.7–4.0)
MCH: 27.7 pg (ref 26.0–34.0)
MCHC: 32 g/dL (ref 30.0–36.0)
MCV: 86.4 fL (ref 78.0–100.0)
Monocytes Absolute: 0.5 10*3/uL (ref 0.1–1.0)
Monocytes Relative: 10 %
Neutro Abs: 2.7 10*3/uL (ref 1.7–7.7)
Neutrophils Relative %: 50 %
Platelets: 274 10*3/uL (ref 150–400)
RBC: 4.19 MIL/uL (ref 3.87–5.11)
RDW: 15.2 % (ref 11.5–15.5)
WBC: 5.3 10*3/uL (ref 4.0–10.5)

## 2016-12-09 LAB — WET PREP, GENITAL
CLUE CELLS WET PREP: NONE SEEN
Sperm: NONE SEEN
TRICH WET PREP: NONE SEEN
Yeast Wet Prep HPF POC: NONE SEEN

## 2016-12-09 LAB — PREGNANCY, URINE: PREG TEST UR: NEGATIVE

## 2016-12-09 LAB — LIPASE, BLOOD: Lipase: 37 U/L (ref 11–51)

## 2016-12-09 MED ORDER — MORPHINE SULFATE (PF) 4 MG/ML IV SOLN
4.0000 mg | Freq: Once | INTRAVENOUS | Status: AC
  Administered 2016-12-09: 4 mg via INTRAMUSCULAR
  Filled 2016-12-09: qty 1

## 2016-12-09 MED ORDER — ACETAMINOPHEN 325 MG PO TABS
650.0000 mg | ORAL_TABLET | Freq: Once | ORAL | Status: AC
  Administered 2016-12-09: 650 mg via ORAL
  Filled 2016-12-09: qty 2

## 2016-12-09 NOTE — ED Notes (Signed)
Pelvic cart to bedside 

## 2016-12-09 NOTE — ED Provider Notes (Signed)
[facility] EMERGENCY DEPARTMENT Provider Note   CSN: [id number] Arrival date & time: 12/09/16  1400     History   Chief Complaint Chief Complaint  Patient presents with  . Pelvic Pain  . Emesis    HPI Kristi Martin is a 46 y.o. female.  HPI  Pt was seen at 1425. Per pt, c/o gradual onset and persistence of constant pelvic "pain" since yesterday afternoon. Pt states the pain began after she found out from her PMD that she had an "abnormal pap smear." Pt has hx of abnormal pap smear in 2016, with negative f/u colposcopy. Pt also states she started her menses today and is nauseated. Denies back pain, no vomiting/diarrhea, no dysuria/hematuria, no fevers, no rash, no CP/SOB, no cough.   Past Medical History:  Diagnosis Date  . Anemia   . Cancer ([place])    cervical cx at age 89  . Cervical cancer ([place])   . Chronic back pain   . Chronic kidney disease   . CKD (chronic kidney disease), symptom management only, stage 3 (moderate) ([place])   . DDD (degenerative disc disease), lumbar    Facet arthritis  . Hypotension   . Left knee pain   . Migraine   . Peripheral neuropathy   . Sciatica   . Vaginal Pap smear, abnormal     Patient Active Problem List   Diagnosis Date Noted  . Substance abuse ([place]) 05/27/2016  . MDD (major depressive disorder) 06/08/2015  . GAD (generalized anxiety disorder) 02/07/2015  . DDD (degenerative disc disease), lumbar 10/10/2014  . Facet arthropathy, lumbar 10/10/2014  . HGSIL (high grade squamous intraepithelial lesion) on Pap smear of cervix 08/04/2014  . Chronic back pain 07/27/2014  . GERD (gastroesophageal reflux disease) 07/27/2014  . Peripheral neuropathy 06/28/2014  . Obesity 06/28/2014  . Glucose intolerance (impaired glucose tolerance) 04/03/2014  . Chronic anemia 04/01/2014  . CKD (chronic kidney disease) stage 3, GFR 30-59 ml/min ([place]) 04/01/2014    Past Surgical History:  Procedure Laterality Date  . TUBAL LIGATION      OB  History    Gravida Para Term Preterm AB Living   9 7 5 2 2 7    SAB TAB Ectopic Multiple Live Births   2       1       Home Medications    Prior to Admission medications   Medication Sig Start Date End Date Taking? Authorizing Provider  Cholecalciferol (VITAMIN D PO) Take 1 tablet daily by mouth.   Yes [provider]  citalopram (CELEXA) 20 MG tablet Take 20 mg by mouth daily.   Yes [provider]  famotidine (PEPCID) 20 MG tablet Take 1 tablet (20 mg total) by mouth 2 (two) times daily as needed for heartburn or indigestion. 12/03/16  Yes Soyla Dryer, PA-C  Multiple Vitamin (MULTIVITAMIN WITH MINERALS) TABS tablet Take 1 tablet daily by mouth.   Yes [provider]  traZODone (DESYREL) 100 MG tablet Take 200 mg by mouth at bedtime.   Yes [provider]    Family History Family History  Problem Relation Age of Onset  . Cancer Mother   . Colon cancer Mother   . Breast cancer Maternal Grandmother   . Heart disease Maternal Grandmother   . Breast cancer Paternal Grandmother   . Heart disease Paternal Grandmother   . Hypertension Paternal Grandmother   . Alcohol abuse Father   . Drug abuse Father   . Heart disease Father   .  Asthma Brother   . Cancer Paternal Grandfather     Social History Social History   Tobacco Use  . Smoking status: Never Smoker  . Smokeless tobacco: Never Used  Substance Use Topics  . Alcohol use: Yes    Alcohol/week: 0.0 oz    Comment: occasional  . Drug use: No     Allergies   Benadryl [diphenhydramine hcl] and Onion   Review of Systems Review of Systems ROS: Statement: All systems negative except as marked or noted in the HPI; Constitutional: Negative for fever and chills. ; ; Eyes: Negative for eye pain, redness and discharge. ; ; ENMT: Negative for ear pain, hoarseness, nasal congestion, sinus pressure and sore throat. ; ; Cardiovascular: Negative for chest pain, palpitations, diaphoresis,  dyspnea and peripheral edema. ; ; Respiratory: Negative for cough, wheezing and stridor. ; ; Gastrointestinal: +nausea. Negative for vomiting, diarrhea, abdominal pain, blood in stool, hematemesis, jaundice and rectal bleeding. . ; ; Genitourinary: Negative for dysuria, flank pain and hematuria. ; ; GYN:  +pelvic pain, +menses/vaginal bleeding, no vaginal discharge, no vulvar pain. ;; Musculoskeletal: Negative for back pain and neck pain. Negative for swelling and trauma.; ; Skin: Negative for pruritus, rash, abrasions, blisters, bruising and skin lesion.; ; Neuro: Negative for headache, lightheadedness and neck stiffness. Negative for weakness, altered level of consciousness, altered mental status, extremity weakness, paresthesias, involuntary movement, seizure and syncope.       Physical Exam Updated Vital Signs BP 113/71   Pulse 80   Temp 97.8 F (36.6 C) (Oral)   Resp 18   Ht 5\' 4"  (1.626 m)   Wt 117.5 kg (259 lb)   LMP 12/09/2016   SpO2 100%   BMI 44.46 kg/m   Physical Exam 1430: Physical examination:  Nursing notes reviewed; Vital signs and O2 SAT reviewed;  Constitutional: Well developed, Well nourished, Well hydrated, In no acute distress; Head:  Normocephalic, atraumatic; Eyes: EOMI, PERRL, No scleral icterus; ENMT: Mouth and pharynx normal, Mucous membranes moist; Neck: Supple, Full range of motion, No lymphadenopathy; Cardiovascular: Regular rate and rhythm, No gallop; Respiratory: Breath sounds clear & equal bilaterally, No wheezes.  Speaking full sentences with ease, Normal respiratory effort/excursion; Chest: Nontender, Movement normal; Abdomen: Soft, +RLQ, suprapubic, LLQ tender to palp. No rebound or guarding. Nondistended, Normal bowel sounds; Genitourinary: No CVA tenderness. Pelvic exam performed with permission of pt and female ED tech assist during exam.  External genitalia w/o lesions. Vaginal vault without discharge, small amount dark blood in vault.  Cervix w/o lesions,  not friable, GC/chlam and wet prep obtained and sent to lab.  Bimanual exam w/o CMT or adnexal tenderness, +suprapubic tenderness.; Extremities: Pulses normal, No tenderness, No edema, No calf edema or asymmetry.; Neuro: AA&Ox3, Major CN grossly intact.  Speech clear. No gross focal motor or sensory deficits in extremities.; Skin: Color normal, Warm, Dry.   ED Treatments / Results  Labs (all labs ordered are listed, but only abnormal results are displayed)   EKG  EKG Interpretation None       Radiology   Procedures Procedures (including critical care time)  Medications Ordered in ED Medications  morphine 4 MG/ML injection 4 mg (4 mg Intramuscular Given 12/09/16 1505)     Initial Impression / Assessment and Plan / ED Course  I have reviewed the triage vital signs and the nursing notes.  Pertinent labs & imaging results that were available during my care of the patient were reviewed by me and considered in my medical decision  making (see chart for details).  MDM Reviewed: previous chart, nursing note and vitals Reviewed previous: labs Interpretation: labs, ultrasound and CT scan   Results for orders placed or performed during the hospital encounter of 12/09/16  Wet prep, genital  Result Value Ref Range   Yeast Wet Prep HPF POC NONE SEEN NONE SEEN   Trich, Wet Prep NONE SEEN NONE SEEN   Clue Cells Wet Prep HPF POC NONE SEEN NONE SEEN   WBC, Wet Prep HPF POC RARE (A) NONE SEEN   Sperm NONE SEEN   Urinalysis, Routine w reflex microscopic  Result Value Ref Range   Color, Urine AMBER (A) YELLOW   APPearance HAZY (A) CLEAR   Specific Gravity, Urine 1.014 1.005 - 1.030   pH 8.0 5.0 - 8.0   Glucose, UA NEGATIVE NEGATIVE mg/dL   Hgb urine dipstick LARGE (A) NEGATIVE   Bilirubin Urine NEGATIVE NEGATIVE   Ketones, ur NEGATIVE NEGATIVE mg/dL   Protein, ur 100 (A) NEGATIVE mg/dL   Nitrite NEGATIVE NEGATIVE   Leukocytes, UA NEGATIVE NEGATIVE   RBC / HPF TOO NUMEROUS TO COUNT  0 - 5 RBC/hpf   WBC, UA 6-30 0 - 5 WBC/hpf   Bacteria, UA RARE (A) NONE SEEN   Squamous Epithelial / LPF 6-30 (A) NONE SEEN  Pregnancy, urine  Result Value Ref Range   Preg Test, Ur NEGATIVE NEGATIVE  Comprehensive metabolic panel  Result Value Ref Range   Sodium 142 135 - 145 mmol/L   Potassium 3.5 3.5 - 5.1 mmol/L   Chloride 106 101 - 111 mmol/L   CO2 27 22 - 32 mmol/L   Glucose, Bld 69 65 - 99 mg/dL   BUN 11 6 - 20 mg/dL   Creatinine, Ser 1.31 (H) 0.44 - 1.00 mg/dL   Calcium 8.2 (L) 8.9 - 10.3 mg/dL   Total Protein 5.9 (L) 6.5 - 8.1 g/dL   Albumin 2.9 (L) 3.5 - 5.0 g/dL   AST 15 15 - 41 U/L   ALT 9 (L) 14 - 54 U/L   Alkaline Phosphatase 64 38 - 126 U/L   Total Bilirubin 0.3 0.3 - 1.2 mg/dL   GFR calc non Af Amer 47 (L) >60 mL/min   GFR calc Af Amer 55 (L) >60 mL/min   Anion gap 9 5 - 15  Lipase, blood  Result Value Ref Range   Lipase 37 11 - 51 U/L  CBC with Differential  Result Value Ref Range   WBC 5.3 4.0 - 10.5 K/uL   RBC 4.19 3.87 - 5.11 MIL/uL   Hemoglobin 11.6 (L) 12.0 - 15.0 g/dL   HCT 36.2 36.0 - 46.0 %   MCV 86.4 78.0 - 100.0 fL   MCH 27.7 26.0 - 34.0 pg   MCHC 32.0 30.0 - 36.0 g/dL   RDW 15.2 11.5 - 15.5 %   Platelets 274 150 - 400 K/uL   Neutrophils Relative % 50 %   Neutro Abs 2.7 1.7 - 7.7 K/uL   Lymphocytes Relative 37 %   Lymphs Abs 2.0 0.7 - 4.0 K/uL   Monocytes Relative 10 %   Monocytes Absolute 0.5 0.1 - 1.0 K/uL   Eosinophils Relative 2 %   Eosinophils Absolute 0.1 0.0 - 0.7 K/uL   Basophils Relative 1 %   Basophils Absolute 0.0 0.0 - 0.1 K/uL    US Pelvis Complete Result Date: 12/09/2016 CLINICAL DATA:  46 y/o F; pelvic pain and nausea. Tubal ligation. History of cervical cancer and ovarian cyst.  EXAM: TRANSABDOMINAL AND TRANSVAGINAL ULTRASOUND OF PELVIS DOPPLER ULTRASOUND OF OVARIES TECHNIQUE: Both transabdominal and transvaginal ultrasound examinations of the pelvis were performed. Transabdominal technique was performed for global imaging  of the pelvis including uterus, ovaries, adnexal regions, and pelvic cul-de-sac. It was necessary to proceed with endovaginal exam following the transabdominal exam to visualize the uterus and adnexa. Color and duplex Doppler ultrasound was utilized to evaluate blood flow to the ovaries. COMPARISON:  02/03/2016 CT abdomen and pelvis. 08/10/2015 pelvic ultrasound FINDINGS: Uterus Measurements: 8.7 x 5.2 x 6.2 cm. Incomplete visualization due to bowel gas shadow. No fibroids or other mass visualized. Endometrium Thickness: 8 mm.  No focal abnormality visualized. Right ovary Not visualized.  Extensive bowel gas shadow. Left ovary Not visualized.  Extensive bowel gas shadow. Pulsed Doppler evaluation of of the adnexa demonstrates normal iliac vessels. Other findings Small volume of simple free fluid in the pelvis, likely physiologic. IMPRESSION: 1. Extensive bowel gas shadow partially obscuring uterus. Ovaries not visualized. Visualized structures are unremarkable. 2. Blood was present on endocavity probe per technician. Electronically Signed   By: Kristine Garbe M.D.   On: 12/09/2016 16:42    Ct Renal Stone Study Result Date: 12/09/2016 CLINICAL DATA:  Low pelvic pain for several years increased over the last 3 days EXAM: CT ABDOMEN AND PELVIS WITHOUT CONTRAST TECHNIQUE: Multidetector CT imaging of the abdomen and pelvis was performed following the standard protocol without IV contrast. COMPARISON:  02/03/2016 FINDINGS: Lower chest: Lung bases are free of acute infiltrate or sizable effusion. A stable subpleural nodule is noted within the right middle lobe best seen on image number 2 of series 4. This is unchanged from a prior exam. Hepatobiliary: No focal liver abnormality is seen. No gallstones, gallbladder wall thickening, or biliary dilatation. Pancreas: Unremarkable. No pancreatic ductal dilatation or surrounding inflammatory changes. Spleen: Normal in size without focal abnormality. Adrenals/Urinary  Tract: Adrenal glands are unremarkable. Kidneys are normal, without renal calculi, focal lesion, or hydronephrosis. Bladder is unremarkable. Stomach/Bowel: Scattered diverticular change of the colon is noted without evidence of diverticulitis. The appendix is within normal limits. No obstructive or inflammatory changes are seen. Vascular/Lymphatic: No significant vascular findings are present. No enlarged abdominal or pelvic lymph nodes. Reproductive: Uterus and bilateral adnexa are unremarkable. Other: No abdominal wall hernia or abnormality. No abdominopelvic ascites. Musculoskeletal: Mild degenerative changes of the thoracolumbar spine are noted. IMPRESSION: Chronic changes as described above.  No acute abnormality is noted. Electronically Signed   By: Inez Catalina M.D.   On: 12/09/2016 16:07    1800:  No clear UTI on Udip and pt denies dysuria. [place] unable to visualize ovaries, but CT scan with unremarkable bilateral adnexa. Doubt torsion. Has tol PO well while in the ED without N/V.  Workup otherwise reassuring. Tx symptomatically, f/u OB/GYN MD. Dx and testing d/w pt and family.  Questions answered.  Verb understanding, agreeable to d/c home with outpt f/u.    Final Clinical Impressions(s) / ED Diagnoses   Final diagnoses:  Pelvic pain in female  Pelvic pain in female    ED Discharge Orders    None       Francine Graven, DO 12/12/16 2302

## 2016-12-09 NOTE — ED Triage Notes (Signed)
C/o pelvic burning and pain, states she also has nausea. States she was informed by her pcp that she has an abnormal pap smear

## 2016-12-09 NOTE — Discharge Instructions (Signed)
Take over the counter tylenol, as directed on packaging, as needed for discomfort. Apply moist heat or ice to the area(s) of discomfort, for 15 minutes at a time, several times per day for the next few days.  Do not fall asleep on a heating or ice pack.  Call your regular medical doctor and your OB/GYN doctor tomorrow to schedule a follow up appointment within the next week.  Return to the Emergency Department immediately if worsening.

## 2016-12-10 LAB — GC/CHLAMYDIA PROBE AMP (~~LOC~~) NOT AT ARMC
Chlamydia: NEGATIVE
NEISSERIA GONORRHEA: NEGATIVE

## 2016-12-23 ENCOUNTER — Ambulatory Visit (INDEPENDENT_AMBULATORY_CARE_PROVIDER_SITE_OTHER): Payer: Self-pay | Admitting: Obstetrics & Gynecology

## 2016-12-23 ENCOUNTER — Encounter: Payer: Self-pay | Admitting: Obstetrics & Gynecology

## 2016-12-23 ENCOUNTER — Other Ambulatory Visit: Payer: Self-pay

## 2016-12-23 VITALS — BP 110/64 | HR 91 | Ht 66.0 in | Wt 260.0 lb

## 2016-12-23 DIAGNOSIS — R87613 High grade squamous intraepithelial lesion on cytologic smear of cervix (HGSIL): Secondary | ICD-10-CM

## 2016-12-23 DIAGNOSIS — Z3202 Encounter for pregnancy test, result negative: Secondary | ICD-10-CM

## 2016-12-23 LAB — POCT URINE PREGNANCY: PREG TEST UR: NEGATIVE

## 2016-12-23 NOTE — Progress Notes (Signed)
Colposcopy Procedure Note:  Colposcopy Procedure Note  Indications: Pap smear 1 months ago showed: ASCUS with POSITIVE high risk HPV. The prior pap showed no abnormalities.  Prior cervical/vaginal disease: normal exam without visible pathology. Prior cervical treatment: no treatment.  Smoker:  Yes.   New sexual partner:  No.  : time frame:  No.  History of abnormal Pap: yes  Procedure Details  The risks and benefits of the procedure and Written informed consent obtained.  Speculum placed in vagina and excellent visualization of cervix achieved, cervix swabbed x 3 with acetic acid solution.  Findings: Cervix: no visible lesions, no mosaicism, no punctation and no abnormal vasculature; no biopsies taken. Vaginal inspection: normal without visible lesions. Vulvar colposcopy: vulvar colposcopy not performed.  Specimens: none  Complications: none.  Plan: Repeat Pap 1 year

## 2017-01-08 ENCOUNTER — Other Ambulatory Visit: Payer: Self-pay | Admitting: Obstetrics & Gynecology

## 2017-01-08 NOTE — Patient Instructions (Signed)
Kristi Martin  01/08/2017     @PREFPERIOPPHARMACY @   Your procedure is scheduled on  01/14/2017   Report to [facility] at  910  A.M.  Call this number if you have problems the morning of surgery:  [phone]   Remember:  Do not eat food or drink liquids after midnight.  Take these medicines the morning of surgery with A SIP OF WATER  celexa   Do not wear jewelry, make-up or nail polish.  Do not wear lotions, powders, or perfumes, or deoderant.  Do not shave 48 hours prior to surgery.  Men may shave face and neck.  Do not bring valuables to the hospital.  [facility] is not responsible for any belongings or valuables.  Contacts, dentures or bridgework may not be worn into surgery.  Leave your suitcase in the car.  After surgery it may be brought to your room.  For patients admitted to the hospital, discharge time will be determined by your treatment team.  Patients discharged the day of surgery will not be allowed to drive home.   Name and phone number of your driver:   Family Special instructions:  None  Please read over the following fact sheets that you were given. Anesthesia Post-op Instructions and Care and Recovery After Surgery       Cervical Conization Cervical conization (cone biopsy) is a procedure in which a cone-shaped portion of the cervix is cut out so that it can be examined under a microscope. The procedure is done to check for cancer cells or cells that might turn into cancer (precancerous cells). You may have this procedure if:  You have abnormal bleeding from your cervix.  You had an abnormal Pap test.  Something abnormal was seen on your cervix during an exam.  This procedure is performed in either a health care provider's office or in an operating room. Tell a health care provider about:  Any allergies you have.  All medicines you are taking, including vitamins, herbs, eye drops, creams, and over-the-counter  medicines.  Any problems you or family members have had with the use of anesthetic medicines.  Any blood disorders you have.  Any surgeries you have had.  Any medical conditions you have.  Your smoking habits.  When you normally have your period.  Whether you are pregnant or may be pregnant. What are the risks? Generally, this is a safe procedure. However, problems may occur, including:  Heavy bleeding for several days or weeks after the procedure.  Allergic reactions to medicines or dyes.  Increased risk of preterm labor in future pregnancies.  Infection (rare).  Damage to the cervix or other structures or organs (rare).  What happens before the procedure? Staying hydrated Follow instructions from your health care provider about hydration, which may include:  Up to 2 hours before the procedure - you may continue to drink clear liquids, such as water, clear fruit juice, black coffee, and plain tea.  Eating and drinking restrictions Follow instructions from your health care provider about eating and drinking, which may include:  8 hours before the procedure - stop eating heavy meals or foods such as meat, fried foods, or fatty foods.  6 hours before the procedure - stop eating light meals or foods, such as toast or cereal.  6 hours before the procedure - stop drinking milk or drinks that contain milk.  2 hours before the procedure - stop drinking clear  liquids.  General instructions  Do not douche, have sex, use tampons, or use any vaginal medicines before the procedure as told by your health care provider.  You may be asked to empty your bladder and bowel right before the procedure.  Ask your health care provider about: ? Changing or stopping your normal medicines. This is important if you take diabetes medicines or blood thinners. ? Taking medicines such as aspirin and ibuprofen. These medicines can thin your blood. Do not take these medicines before your  procedure if your doctor tells you not to.  Plan to have someone take you home from the hospital or clinic. What happens during the procedure?  To reduce your risk of infection: ? Your health care team will wash or sanitize their hands. ? Your skin will be washed with soap. ? Hair may be removed from the surgical area.  You will undress from the waist down and be given a gown to wear.  You will lie on an examining table and put your feet in stirrups.  An IV tube will be inserted into one of your veins.  You will be given one or more of the following: ? A medicine to help you relax (sedative). ? A medicine to numb the area (local anesthetic). ? A medicine to make you fall asleep (general anesthetic). ? A medicine that numbs the cervix (cervical block).  A lubricated device called a speculum will be inserted into your vagina. It will be used to spread open the walls of the vagina so your health care provider can see the inside of the vagina and cervix better.  An instrument that has a magnifying lens and a light (colposcope) will let your health care provider examine the cervix more closely.  Your health care provider will apply a solution to your cervix. This turns abnormal areas a pale color.  A tissue sample will be removed from the cervix using one of the following methods: ? The cold knife method. In this method, the tissue is cut out with a knife (scalpel). ? The loop electrosurgical excision procedure (LEEP) method. In this method, the tissue is cut out with a thin wire that can burn (cauterize) the tissue with an electrical current. ? Laser treatment method. In this method, the tissue is cut out and then cauterized with a laser beam to prevent bleeding.  Your health care provider will apply a paste over the biopsy areas to help control bleeding.  The tissue sample will be examined under a microscope. The procedure may vary among health care providers and hospitals. What  happens after the procedure?  Your blood pressure, heart rate, breathing rate, and blood oxygen level will be monitored often until the medicines you were given have worn off.  If you were given a local anesthetic, you will rest at the clinic or hospital until you are stable and feel ready to go home.  If you were given a general anesthetic, you may be monitored for a longer period of time.  You may have some cramping.  You may have bloody discharge or light to moderate bleeding.  You may have dark discharge coming from your vagina. This is from the paste used on the cervix to prevent bleeding. Summary  Cervical conization is a procedure in which a cone-shaped portion of the cervix is cut out so that it can be examined under a microscope.  The procedure is done to check for cancer cells or cells that might turn into cancer (  precancerous cells). This information is not intended to replace advice given to you by your health care provider. Make sure you discuss any questions you have with your health care provider. Document Released: 10/30/2004 Document Revised: 01/23/2016 Document Reviewed: 01/23/2016 Elsevier Interactive Patient Education  2017 [employer]  Cervical Conization, Care After This sheet gives you information about how to care for yourself after your procedure. Your doctor may also give you more specific instructions. If you have problems or questions, contact your doctor. Follow these instructions at home: Medicines  Take over-the-counter and prescription medicines only as told by your doctor.  Do not take aspirin until your doctor says it is okay.  If you take pain medicine: ? You may have constipation. To help treat this, your doctor may tell you to:  Drink enough fluid to keep your pee (urine) clear or pale yellow.  Take medicines.  Eat foods that are high in fiber. These include fresh fruits and vegetables, whole grains, bran, and beans.  Limit foods that  are high in fat and sugar. These include fried foods and sweet foods. ? Do not drive or use heavy machines. General instructions  You can eat your usual diet unless your doctor tells you not to do so.  Take showers for the first week. Do not take baths, swim, or use hot tubs until your doctor says it is okay.  Do not douche, use tampons, or have sex until your doctor says it is okay.  For 7-14 days after your procedure, avoid: ? Being very active. ? Exercising. ? Heavy lifting.  Keep all follow-up visits as told by your doctor. This is important. Contact a doctor if:  You have a rash.  You are dizzy or lightheaded.  You feel sick to your stomach (nauseous).  You throw up (vomit).  You have fluid from your vagina (vaginal discharge) that smells bad. Get help right away if:  There are blood clots coming from your vagina.  You have more bleeding than you would have in a normal period. For example, you soak a pad in less than 1 hour.  You have a fever.  You have more and more cramps.  You pass out (faint).  You have pain when peeing.  Your have a lot of pain.  Your pain gets worse.  Your pain does not get better when you take your medicine.  You have blood in your pee.  You throw up (vomit). Summary  After your procedure, take over-the-counter and prescription medicines only as told by your doctor.  Do not douche, use tampons, or have sex until your doctor says it is okay.  For about 7-14 days after your procedure, try not to exercise or lift heavy objects.  Get help right away if you have new symptoms, or if your symptoms become worse. This information is not intended to replace advice given to you by your health care provider. Make sure you discuss any questions you have with your health care provider. Document Released: 10/30/2007 Document Revised: 01/23/2016 Document Reviewed: 01/23/2016 Elsevier Interactive Patient Education  2017 [place] Anesthesia, Adult General anesthesia is the use of medicines to make a person "go to sleep" (be unconscious) for a medical procedure. General anesthesia is often recommended when a procedure:  Is long.  Requires you to be still or in an unusual position.  Is major and can cause you to lose blood.  Is impossible to do without general anesthesia.  The medicines used  for general anesthesia are called general anesthetics. In addition to making you sleep, the medicines:  Prevent pain.  Control your blood pressure.  Relax your muscles.  Tell a health care provider about:  Any allergies you have.  All medicines you are taking, including vitamins, herbs, eye drops, creams, and over-the-counter medicines.  Any problems you or family members have had with anesthetic medicines.  Types of anesthetics you have had in the past.  Any bleeding disorders you have.  Any surgeries you have had.  Any medical conditions you have.  Any history of heart or lung conditions, such as heart failure, sleep apnea, or chronic obstructive pulmonary disease (COPD).  Whether you are pregnant or may be pregnant.  Whether you use tobacco, alcohol, marijuana, or street drugs.  Any history of Armed forces logistics/support/administrative officer.  Any history of depression or anxiety. What are the risks? Generally, this is a safe procedure. However, problems may occur, including:  Allergic reaction to anesthetics.  Lung and heart problems.  Inhaling food or liquids from your stomach into your lungs (aspiration).  Injury to nerves.  Waking up during your procedure and being unable to move (rare).  Extreme agitation or a state of mental confusion (delirium) when you wake up from the anesthetic.  Air in the bloodstream, which can lead to stroke.  These problems are more likely to develop if you are having a major surgery or if you have an advanced medical condition. You can prevent some of these complications by  answering all of your health care provider's questions thoroughly and by following all pre-procedure instructions. General anesthesia can cause side effects, including:  Nausea or vomiting  A sore throat from the breathing tube.  Feeling cold or shivery.  Feeling tired, washed out, or achy.  Sleepiness or drowsiness.  Confusion or agitation.  What happens before the procedure? Staying hydrated Follow instructions from your health care provider about hydration, which may include:  Up to 2 hours before the procedure - you may continue to drink clear liquids, such as water, clear fruit juice, black coffee, and plain tea.  Eating and drinking restrictions Follow instructions from your health care provider about eating and drinking, which may include:  8 hours before the procedure - stop eating heavy meals or foods such as meat, fried foods, or fatty foods.  6 hours before the procedure - stop eating light meals or foods, such as toast or cereal.  6 hours before the procedure - stop drinking milk or drinks that contain milk.  2 hours before the procedure - stop drinking clear liquids.  Medicines  Ask your health care provider about: ? Changing or stopping your regular medicines. This is especially important if you are taking diabetes medicines or blood thinners. ? Taking medicines such as aspirin and ibuprofen. These medicines can thin your blood. Do not take these medicines before your procedure if your health care provider instructs you not to. ? Taking new dietary supplements or medicines. Do not take these during the week before your procedure unless your health care provider approves them.  If you are told to take a medicine or to continue taking a medicine on the day of the procedure, take the medicine with sips of water. General instructions   Ask if you will be going home the same day, the following day, or after a longer hospital stay. ? Plan to have someone take you  home. ? Plan to have someone stay with you for the first 24 hours  after you leave the hospital or clinic.  For 3-6 weeks before the procedure, try not to use any tobacco products, such as cigarettes, chewing tobacco, and e-cigarettes.  You may brush your teeth on the morning of the procedure, but make sure to spit out the toothpaste. What happens during the procedure?  You will be given anesthetics through a mask and through an IV tube in one of your veins.  You may receive medicine to help you relax (sedative).  As soon as you are asleep, a breathing tube may be used to help you breathe.  An anesthesia specialist will stay with you throughout the procedure. He or she will help keep you comfortable and safe by continuing to give you medicines and adjusting the amount of medicine that you get. He or she will also watch your blood pressure, pulse, and oxygen levels to make sure that the anesthetics do not cause any problems.  If a breathing tube was used to help you breathe, it will be removed before you wake up. The procedure may vary among health care providers and hospitals. What happens after the procedure?  You will wake up, often slowly, after the procedure is complete, usually in a recovery area.  Your blood pressure, heart rate, breathing rate, and blood oxygen level will be monitored until the medicines you were given have worn off.  You may be given medicine to help you calm down if you feel anxious or agitated.  If you will be going home the same day, your health care provider may check to make sure you can stand, drink, and urinate.  Your health care providers will treat your pain and side effects before you go home.  Do not drive for 24 hours if you received a sedative.  You may: ? Feel nauseous and vomit. ? Have a sore throat. ? Have mental slowness. ? Feel cold or shivery. ? Feel sleepy. ? Feel tired. ? Feel sore or achy, even in parts of your body where you did  not have surgery. This information is not intended to replace advice given to you by your health care provider. Make sure you discuss any questions you have with your health care provider. Document Released: 04/29/2007 Document Revised: 07/03/2015 Document Reviewed: 01/04/2015 Elsevier Interactive Patient Education  2018 [place] Anesthesia, Adult, Care After These instructions provide you with information about caring for yourself after your procedure. Your health care provider may also give you more specific instructions. Your treatment has been planned according to current medical practices, but problems sometimes occur. Call your health care provider if you have any problems or questions after your procedure. What can I expect after the procedure? After the procedure, it is common to have:  Vomiting.  A sore throat.  Mental slowness.  It is common to feel:  Nauseous.  Cold or shivery.  Sleepy.  Tired.  Sore or achy, even in parts of your body where you did not have surgery.  Follow these instructions at home: For at least 24 hours after the procedure:  Do not: ? Participate in activities where you could fall or become injured. ? Drive. ? Use heavy machinery. ? Drink alcohol. ? Take sleeping pills or medicines that cause drowsiness. ? Make important decisions or sign legal documents. ? Take care of children on your own.  Rest. Eating and drinking  If you vomit, drink water, juice, or soup when you can drink without vomiting.  Drink enough fluid to keep your urine  clear or pale yellow.  Make sure you have little or no nausea before eating solid foods.  Follow the diet recommended by your health care provider. General instructions  Have a responsible adult stay with you until you are awake and alert.  Return to your normal activities as told by your health care provider. Ask your health care provider what activities are safe for you.  Take  over-the-counter and prescription medicines only as told by your health care provider.  If you smoke, do not smoke without supervision.  Keep all follow-up visits as told by your health care provider. This is important. Contact a health care provider if:  You continue to have nausea or vomiting at home, and medicines are not helpful.  You cannot drink fluids or start eating again.  You cannot urinate after 8-12 hours.  You develop a skin rash.  You have fever.  You have increasing redness at the site of your procedure. Get help right away if:  You have difficulty breathing.  You have chest pain.  You have unexpected bleeding.  You feel that you are having a life-threatening or urgent problem. This information is not intended to replace advice given to you by your health care provider. Make sure you discuss any questions you have with your health care provider. Document Released: 04/28/2000 Document Revised: 06/25/2015 Document Reviewed: 01/04/2015 Elsevier Interactive Patient Education  [employer].

## 2017-01-09 ENCOUNTER — Other Ambulatory Visit: Payer: Self-pay

## 2017-01-09 ENCOUNTER — Encounter (HOSPITAL_COMMUNITY): Payer: Self-pay

## 2017-01-09 ENCOUNTER — Encounter (HOSPITAL_COMMUNITY)
Admission: RE | Admit: 2017-01-09 | Discharge: 2017-01-09 | Disposition: A | Discharge status: Home / Self Care | Payer: Self-pay | Source: Ambulatory Visit | Attending: Obstetrics & Gynecology | Admitting: Obstetrics & Gynecology

## 2017-01-09 DIAGNOSIS — R87610 Atypical squamous cells of undetermined significance on cytologic smear of cervix (ASC-US): Secondary | ICD-10-CM | POA: Insufficient documentation

## 2017-01-09 DIAGNOSIS — R87810 Cervical high risk human papillomavirus (HPV) DNA test positive: Secondary | ICD-10-CM | POA: Insufficient documentation

## 2017-01-09 DIAGNOSIS — Z01812 Encounter for preprocedural laboratory examination: Secondary | ICD-10-CM | POA: Insufficient documentation

## 2017-01-09 HISTORY — DX: Anxiety disorder, unspecified: F41.9

## 2017-01-09 HISTORY — DX: Unspecified asthma, uncomplicated: J45.909

## 2017-01-09 HISTORY — DX: Gastro-esophageal reflux disease without esophagitis: K21.9

## 2017-01-09 LAB — URINALYSIS, ROUTINE W REFLEX MICROSCOPIC
Bilirubin Urine: NEGATIVE
GLUCOSE, UA: NEGATIVE mg/dL
KETONES UR: NEGATIVE mg/dL
NITRITE: NEGATIVE
PROTEIN: 30 mg/dL — AB
Specific Gravity, Urine: 1.021 (ref 1.005–1.030)
pH: 5 (ref 5.0–8.0)

## 2017-01-09 LAB — COMPREHENSIVE METABOLIC PANEL
ALK PHOS: 57 U/L (ref 38–126)
ALT: 10 U/L — AB (ref 14–54)
ANION GAP: 3 — AB (ref 5–15)
AST: 13 U/L — ABNORMAL LOW (ref 15–41)
Albumin: 2.8 g/dL — ABNORMAL LOW (ref 3.5–5.0)
BILIRUBIN TOTAL: 0.5 mg/dL (ref 0.3–1.2)
BUN: 13 mg/dL (ref 6–20)
CALCIUM: 8.3 mg/dL — AB (ref 8.9–10.3)
CO2: 27 mmol/L (ref 22–32)
CREATININE: 1.29 mg/dL — AB (ref 0.44–1.00)
Chloride: 107 mmol/L (ref 101–111)
GFR, EST AFRICAN AMERICAN: 56 mL/min — AB (ref >60)
GFR, EST NON AFRICAN AMERICAN: 48 mL/min — AB (ref >60)
Glucose, Bld: 118 mg/dL — ABNORMAL HIGH (ref 65–99)
Potassium: 3.8 mmol/L (ref 3.5–5.1)
Sodium: 137 mmol/L (ref 135–145)
TOTAL PROTEIN: 5.7 g/dL — AB (ref 6.5–8.1)

## 2017-01-09 LAB — CBC
HEMATOCRIT: 41.5 % (ref 36.0–46.0)
HEMOGLOBIN: 12.7 g/dL (ref 12.0–15.0)
MCH: 26.8 pg (ref 26.0–34.0)
MCHC: 30.6 g/dL (ref 30.0–36.0)
MCV: 87.7 fL (ref 78.0–100.0)
Platelets: 258 10*3/uL (ref 150–400)
RBC: 4.73 MIL/uL (ref 3.87–5.11)
RDW: 14.8 % (ref 11.5–15.5)
WBC: 4.6 10*3/uL (ref 4.0–10.5)

## 2017-01-09 LAB — HCG, QUANTITATIVE, PREGNANCY: hCG, Beta Chain, Quant, S: <1 m[IU]/mL (ref <5)

## 2017-01-09 LAB — RAPID HIV SCREEN (HIV 1/2 AB+AG)
HIV 1/2 ANTIBODIES: NONREACTIVE
HIV-1 P24 ANTIGEN - HIV24: NONREACTIVE

## 2017-01-09 NOTE — Progress Notes (Signed)
   01/09/17 1107  OBSTRUCTIVE SLEEP APNEA  Have you ever been diagnosed with sleep apnea through a sleep study? No  Do you snore loudly (loud enough to be heard through closed doors)?  1  Do you often feel tired, fatigued, or sleepy during the daytime (such as falling asleep during driving or talking to someone)? 1  Has anyone observed you stop breathing during your sleep? 1  Do you have, or are you being treated for high blood pressure? 0  BMI more than 35 kg/m2? 1  Age > 50 (1-yes) 0  Neck circumference greater than:Female 16 inches or larger, Female 17inches or larger? 0  Female Gender (Yes=1) 0  Obstructive Sleep Apnea Score 4

## 2017-01-14 ENCOUNTER — Encounter (HOSPITAL_COMMUNITY)
Admission: RE | Disposition: A | Discharge status: Home / Self Care | Payer: Self-pay | Source: Ambulatory Visit | Attending: Obstetrics & Gynecology

## 2017-01-14 ENCOUNTER — Ambulatory Visit (HOSPITAL_COMMUNITY)
Admission: RE | Admit: 2017-01-14 | Discharge: 2017-01-14 | Disposition: A | Discharge status: Home / Self Care | Payer: Self-pay | Source: Ambulatory Visit | Attending: Obstetrics & Gynecology | Admitting: Obstetrics & Gynecology

## 2017-01-14 ENCOUNTER — Encounter (HOSPITAL_COMMUNITY): Payer: Self-pay | Admitting: *Deleted

## 2017-01-14 ENCOUNTER — Ambulatory Visit (HOSPITAL_COMMUNITY): Payer: Self-pay | Admitting: Anesthesiology

## 2017-01-14 DIAGNOSIS — N879 Dysplasia of cervix uteri, unspecified: Secondary | ICD-10-CM | POA: Insufficient documentation

## 2017-01-14 DIAGNOSIS — Z539 Procedure and treatment not carried out, unspecified reason: Secondary | ICD-10-CM | POA: Insufficient documentation

## 2017-01-14 DIAGNOSIS — Z6841 Body Mass Index (BMI) 40.0 and over, adult: Secondary | ICD-10-CM | POA: Insufficient documentation

## 2017-01-14 DIAGNOSIS — K219 Gastro-esophageal reflux disease without esophagitis: Secondary | ICD-10-CM | POA: Insufficient documentation

## 2017-01-14 LAB — RAPID URINE DRUG SCREEN, HOSP PERFORMED
AMPHETAMINES: NOT DETECTED
BENZODIAZEPINES: NOT DETECTED
Barbiturates: NOT DETECTED
COCAINE: POSITIVE — AB
OPIATES: NOT DETECTED
Tetrahydrocannabinol: NOT DETECTED

## 2017-01-14 SURGERY — CONE BIOPSY, CERVIX
Anesthesia: General

## 2017-01-14 MED ORDER — LABETALOL HCL 5 MG/ML IV SOLN
INTRAVENOUS | Status: AC
Start: 2017-01-14 — End: 2017-01-14
  Filled 2017-01-14: qty 4

## 2017-01-14 MED ORDER — CEFAZOLIN SODIUM-DEXTROSE 2-4 GM/100ML-% IV SOLN: 2.0000 g | INTRAVENOUS | Status: AC

## 2017-01-14 MED ORDER — MIDAZOLAM HCL 2 MG/2ML IJ SOLN
INTRAMUSCULAR | Status: AC
  Filled 2017-01-14: qty 2

## 2017-01-14 MED ORDER — LIDOCAINE HCL (PF) 1 % IJ SOLN
INTRAMUSCULAR | Status: AC
  Filled 2017-01-14: qty 5

## 2017-01-14 MED ORDER — CEFAZOLIN SODIUM-DEXTROSE 2-4 GM/100ML-% IV SOLN
INTRAVENOUS | Status: AC
  Filled 2017-01-14: qty 100

## 2017-01-14 MED ORDER — ONDANSETRON HCL 4 MG/2ML IJ SOLN
INTRAMUSCULAR | Status: AC
Start: 2017-01-14 — End: 2017-01-14
  Filled 2017-01-14: qty 2

## 2017-01-14 MED ORDER — FENTANYL CITRATE (PF) 250 MCG/5ML IJ SOLN
INTRAMUSCULAR | Status: AC
  Filled 2017-01-14: qty 5

## 2017-01-14 MED ORDER — FERRIC SUBSULFATE 259 MG/GM EX SOLN
CUTANEOUS | Status: AC
  Filled 2017-01-14: qty 8

## 2017-01-14 MED ORDER — MIDAZOLAM HCL 2 MG/2ML IJ SOLN: 1.0000 mg | INTRAMUSCULAR | Status: AC

## 2017-01-14 MED ORDER — LACTATED RINGERS IV SOLN: INTRAVENOUS | Status: DC

## 2017-01-14 MED ORDER — ONDANSETRON HCL 4 MG/2ML IJ SOLN: 4.0000 mg | Freq: Once | INTRAMUSCULAR | Status: DC

## 2017-01-14 MED ORDER — SODIUM CHLORIDE 0.9 % IJ SOLN
INTRAMUSCULAR | Status: AC
  Filled 2017-01-14: qty 10

## 2017-01-14 MED ORDER — KETOROLAC TROMETHAMINE 30 MG/ML IJ SOLN
30.0000 mg | Freq: Once | INTRAMUSCULAR | Status: DC
  Filled 2017-01-14: qty 1

## 2017-01-14 MED ORDER — PROPOFOL 10 MG/ML IV BOLUS
INTRAVENOUS | Status: AC
  Filled 2017-01-14: qty 20

## 2017-01-14 MED ORDER — EPHEDRINE SULFATE 50 MG/ML IJ SOLN
INTRAMUSCULAR | Status: AC
  Filled 2017-01-14: qty 1

## 2017-01-14 NOTE — Anesthesia Preprocedure Evaluation (Signed)
Anesthesia Evaluation  Patient identified by MRN, date of birth, ID band Patient awake    Reviewed: Allergy & Precautions, NPO status , Patient's Chart, lab work & pertinent test results  Airway Mallampati: II  TM Distance: >3 FB Neck ROM: Full    Dental  (+) Poor Dentition, Missing, Chipped, Dental Advisory Given   Pulmonary asthma (inactive) ,    breath sounds clear to auscultation       Cardiovascular negative cardio ROS   Rhythm:Regular Rate:Normal     Neuro/Psych  Headaches, PSYCHIATRIC DISORDERS Anxiety Depression  Neuromuscular disease    GI/Hepatic GERD  Controlled and Medicated,  Endo/Other  Morbid obesity  Renal/GU Renal disease     Musculoskeletal  (+) Arthritis ,   Abdominal   Peds  Hematology  (+) anemia ,   Anesthesia Other Findings   Reproductive/Obstetrics                             Anesthesia Physical Anesthesia Plan  ASA: II  Anesthesia Plan: General   Post-op Pain Management:    Induction: Intravenous  PONV Risk Score and Plan:   Airway Management Planned: LMA  Additional Equipment:   Intra-op Plan:   Post-operative Plan: Extubation in OR  Informed Consent: I have reviewed the patients History and Physical, chart, labs and discussed the procedure including the risks, benefits and alternatives for the proposed anesthesia with the patient or authorized representative who has indicated his/her understanding and acceptance.     Plan Discussed with:   Anesthesia Plan Comments:         Anesthesia Quick Evaluation

## 2017-01-22 ENCOUNTER — Encounter: Payer: Self-pay | Admitting: Obstetrics & Gynecology

## 2017-02-25 MED ORDER — FENTANYL CITRATE (PF) 100 MCG/2ML IJ SOLN: 25.0000 ug | INTRAMUSCULAR | Status: DC | PRN

## 2017-02-25 NOTE — Patient Instructions (Signed)
Kristi Martin  02/25/2017     @PREFPERIOPPHARMACY @   Your procedure is scheduled on  03/04/2017 .  Report to [facility] at  845   A.M.  Call this number if you have problems the morning of surgery:  [phone]   Remember:  Do not eat food or drink liquids after midnight.  Take these medicines the morning of surgery with A SIP OF WATER  celexa.   Do not wear jewelry, make-up or nail polish.  Do not wear lotions, powders, or perfumes, or deodorant.  Do not shave 48 hours prior to surgery.  Men may shave face and neck.  Do not bring valuables to the hospital.  [facility] is not responsible for any belongings or valuables.  Contacts, dentures or bridgework may not be worn into surgery.  Leave your suitcase in the car.  After surgery it may be brought to your room.  For patients admitted to the hospital, discharge time will be determined by your treatment team.  Patients discharged the day of surgery will not be allowed to drive home.   Name and phone number of your driver:   family Special instructions:  None  Please read over the following fact sheets that you were given. Anesthesia Post-op Instructions and Care and Recovery After Surgery       Cervical Conization Cervical conization (cone biopsy) is a procedure in which a cone-shaped portion of the cervix is cut out so that it can be examined under a microscope. The procedure is done to check for cancer cells or cells that might turn into cancer (precancerous cells). You may have this procedure if:  You have abnormal bleeding from your cervix.  You had an abnormal Pap test.  Something abnormal was seen on your cervix during an exam.  This procedure is performed in either a health care provider's office or in an operating room. Tell a health care provider about:  Any allergies you have.  All medicines you are taking, including vitamins, herbs, eye drops, creams, and over-the-counter  medicines.  Any problems you or family members have had with the use of anesthetic medicines.  Any blood disorders you have.  Any surgeries you have had.  Any medical conditions you have.  Your smoking habits.  When you normally have your period.  Whether you are pregnant or may be pregnant. What are the risks? Generally, this is a safe procedure. However, problems may occur, including:  Heavy bleeding for several days or weeks after the procedure.  Allergic reactions to medicines or dyes.  Increased risk of preterm labor in future pregnancies.  Infection (rare).  Damage to the cervix or other structures or organs (rare).  What happens before the procedure? Staying hydrated Follow instructions from your health care provider about hydration, which may include:  Up to 2 hours before the procedure - you may continue to drink clear liquids, such as water, clear fruit juice, black coffee, and plain tea.  Eating and drinking restrictions Follow instructions from your health care provider about eating and drinking, which may include:  8 hours before the procedure - stop eating heavy meals or foods such as meat, fried foods, or fatty foods.  6 hours before the procedure - stop eating light meals or foods, such as toast or cereal.  6 hours before the procedure - stop drinking milk or drinks that contain milk.  2 hours before the procedure - stop drinking  clear liquids.  General instructions  Do not douche, have sex, use tampons, or use any vaginal medicines before the procedure as told by your health care provider.  You may be asked to empty your bladder and bowel right before the procedure.  Ask your health care provider about: ? Changing or stopping your normal medicines. This is important if you take diabetes medicines or blood thinners. ? Taking medicines such as aspirin and ibuprofen. These medicines can thin your blood. Do not take these medicines before your  procedure if your doctor tells you not to.  Plan to have someone take you home from the hospital or clinic. What happens during the procedure?  To reduce your risk of infection: ? Your health care team will wash or sanitize their hands. ? Your skin will be washed with soap. ? Hair may be removed from the surgical area.  You will undress from the waist down and be given a gown to wear.  You will lie on an examining table and put your feet in stirrups.  An IV tube will be inserted into one of your veins.  You will be given one or more of the following: ? A medicine to help you relax (sedative). ? A medicine to numb the area (local anesthetic). ? A medicine to make you fall asleep (general anesthetic). ? A medicine that numbs the cervix (cervical block).  A lubricated device called a speculum will be inserted into your vagina. It will be used to spread open the walls of the vagina so your health care provider can see the inside of the vagina and cervix better.  An instrument that has a magnifying lens and a light (colposcope) will let your health care provider examine the cervix more closely.  Your health care provider will apply a solution to your cervix. This turns abnormal areas a pale color.  A tissue sample will be removed from the cervix using one of the following methods: ? The cold knife method. In this method, the tissue is cut out with a knife (scalpel). ? The loop electrosurgical excision procedure (LEEP) method. In this method, the tissue is cut out with a thin wire that can burn (cauterize) the tissue with an electrical current. ? Laser treatment method. In this method, the tissue is cut out and then cauterized with a laser beam to prevent bleeding.  Your health care provider will apply a paste over the biopsy areas to help control bleeding.  The tissue sample will be examined under a microscope. The procedure may vary among health care providers and hospitals. What  happens after the procedure?  Your blood pressure, heart rate, breathing rate, and blood oxygen level will be monitored often until the medicines you were given have worn off.  If you were given a local anesthetic, you will rest at the clinic or hospital until you are stable and feel ready to go home.  If you were given a general anesthetic, you may be monitored for a longer period of time.  You may have some cramping.  You may have bloody discharge or light to moderate bleeding.  You may have dark discharge coming from your vagina. This is from the paste used on the cervix to prevent bleeding. Summary  Cervical conization is a procedure in which a cone-shaped portion of the cervix is cut out so that it can be examined under a microscope.  The procedure is done to check for cancer cells or cells that might turn into  cancer (precancerous cells). This information is not intended to replace advice given to you by your health care provider. Make sure you discuss any questions you have with your health care provider. Document Released: 10/30/2004 Document Revised: 01/23/2016 Document Reviewed: 01/23/2016 Elsevier Interactive Patient Education  2017 [employer]  Cervical Conization, Care After This sheet gives you information about how to care for yourself after your procedure. Your doctor may also give you more specific instructions. If you have problems or questions, contact your doctor. Follow these instructions at home: Medicines  Take over-the-counter and prescription medicines only as told by your doctor.  Do not take aspirin until your doctor says it is okay.  If you take pain medicine: ? You may have constipation. To help treat this, your doctor may tell you to:  Drink enough fluid to keep your pee (urine) clear or pale yellow.  Take medicines.  Eat foods that are high in fiber. These include fresh fruits and vegetables, whole grains, bran, and beans.  Limit foods that  are high in fat and sugar. These include fried foods and sweet foods. ? Do not drive or use heavy machines. General instructions  You can eat your usual diet unless your doctor tells you not to do so.  Take showers for the first week. Do not take baths, swim, or use hot tubs until your doctor says it is okay.  Do not douche, use tampons, or have sex until your doctor says it is okay.  For 7-14 days after your procedure, avoid: ? Being very active. ? Exercising. ? Heavy lifting.  Keep all follow-up visits as told by your doctor. This is important. Contact a doctor if:  You have a rash.  You are dizzy or lightheaded.  You feel sick to your stomach (nauseous).  You throw up (vomit).  You have fluid from your vagina (vaginal discharge) that smells bad. Get help right away if:  There are blood clots coming from your vagina.  You have more bleeding than you would have in a normal period. For example, you soak a pad in less than 1 hour.  You have a fever.  You have more and more cramps.  You pass out (faint).  You have pain when peeing.  Your have a lot of pain.  Your pain gets worse.  Your pain does not get better when you take your medicine.  You have blood in your pee.  You throw up (vomit). Summary  After your procedure, take over-the-counter and prescription medicines only as told by your doctor.  Do not douche, use tampons, or have sex until your doctor says it is okay.  For about 7-14 days after your procedure, try not to exercise or lift heavy objects.  Get help right away if you have new symptoms, or if your symptoms become worse. This information is not intended to replace advice given to you by your health care provider. Make sure you discuss any questions you have with your health care provider. Document Released: 10/30/2007 Document Revised: 01/23/2016 Document Reviewed: 01/23/2016 Elsevier Interactive Patient Education  2017 [place] Anesthesia, Adult General anesthesia is the use of medicines to make a person "go to sleep" (be unconscious) for a medical procedure. General anesthesia is often recommended when a procedure:  Is long.  Requires you to be still or in an unusual position.  Is major and can cause you to lose blood.  Is impossible to do without general anesthesia.  The medicines  used for general anesthesia are called general anesthetics. In addition to making you sleep, the medicines:  Prevent pain.  Control your blood pressure.  Relax your muscles.  Tell a health care provider about:  Any allergies you have.  All medicines you are taking, including vitamins, herbs, eye drops, creams, and over-the-counter medicines.  Any problems you or family members have had with anesthetic medicines.  Types of anesthetics you have had in the past.  Any bleeding disorders you have.  Any surgeries you have had.  Any medical conditions you have.  Any history of heart or lung conditions, such as heart failure, sleep apnea, or chronic obstructive pulmonary disease (COPD).  Whether you are pregnant or may be pregnant.  Whether you use tobacco, alcohol, marijuana, or street drugs.  Any history of Armed forces logistics/support/administrative officer.  Any history of depression or anxiety. What are the risks? Generally, this is a safe procedure. However, problems may occur, including:  Allergic reaction to anesthetics.  Lung and heart problems.  Inhaling food or liquids from your stomach into your lungs (aspiration).  Injury to nerves.  Waking up during your procedure and being unable to move (rare).  Extreme agitation or a state of mental confusion (delirium) when you wake up from the anesthetic.  Air in the bloodstream, which can lead to stroke.  These problems are more likely to develop if you are having a major surgery or if you have an advanced medical condition. You can prevent some of these complications by  answering all of your health care provider's questions thoroughly and by following all pre-procedure instructions. General anesthesia can cause side effects, including:  Nausea or vomiting  A sore throat from the breathing tube.  Feeling cold or shivery.  Feeling tired, washed out, or achy.  Sleepiness or drowsiness.  Confusion or agitation.  What happens before the procedure? Staying hydrated Follow instructions from your health care provider about hydration, which may include:  Up to 2 hours before the procedure - you may continue to drink clear liquids, such as water, clear fruit juice, black coffee, and plain tea.  Eating and drinking restrictions Follow instructions from your health care provider about eating and drinking, which may include:  8 hours before the procedure - stop eating heavy meals or foods such as meat, fried foods, or fatty foods.  6 hours before the procedure - stop eating light meals or foods, such as toast or cereal.  6 hours before the procedure - stop drinking milk or drinks that contain milk.  2 hours before the procedure - stop drinking clear liquids.  Medicines  Ask your health care provider about: ? Changing or stopping your regular medicines. This is especially important if you are taking diabetes medicines or blood thinners. ? Taking medicines such as aspirin and ibuprofen. These medicines can thin your blood. Do not take these medicines before your procedure if your health care provider instructs you not to. ? Taking new dietary supplements or medicines. Do not take these during the week before your procedure unless your health care provider approves them.  If you are told to take a medicine or to continue taking a medicine on the day of the procedure, take the medicine with sips of water. General instructions   Ask if you will be going home the same day, the following day, or after a longer hospital stay. ? Plan to have someone take you  home. ? Plan to have someone stay with you for the first 24  hours after you leave the hospital or clinic.  For 3-6 weeks before the procedure, try not to use any tobacco products, such as cigarettes, chewing tobacco, and e-cigarettes.  You may brush your teeth on the morning of the procedure, but make sure to spit out the toothpaste. What happens during the procedure?  You will be given anesthetics through a mask and through an IV tube in one of your veins.  You may receive medicine to help you relax (sedative).  As soon as you are asleep, a breathing tube may be used to help you breathe.  An anesthesia specialist will stay with you throughout the procedure. He or she will help keep you comfortable and safe by continuing to give you medicines and adjusting the amount of medicine that you get. He or she will also watch your blood pressure, pulse, and oxygen levels to make sure that the anesthetics do not cause any problems.  If a breathing tube was used to help you breathe, it will be removed before you wake up. The procedure may vary among health care providers and hospitals. What happens after the procedure?  You will wake up, often slowly, after the procedure is complete, usually in a recovery area.  Your blood pressure, heart rate, breathing rate, and blood oxygen level will be monitored until the medicines you were given have worn off.  You may be given medicine to help you calm down if you feel anxious or agitated.  If you will be going home the same day, your health care provider may check to make sure you can stand, drink, and urinate.  Your health care providers will treat your pain and side effects before you go home.  Do not drive for 24 hours if you received a sedative.  You may: ? Feel nauseous and vomit. ? Have a sore throat. ? Have mental slowness. ? Feel cold or shivery. ? Feel sleepy. ? Feel tired. ? Feel sore or achy, even in parts of your body where you did  not have surgery. This information is not intended to replace advice given to you by your health care provider. Make sure you discuss any questions you have with your health care provider. Document Released: 04/29/2007 Document Revised: 07/03/2015 Document Reviewed: 01/04/2015 Elsevier Interactive Patient Education  2018 [place] Anesthesia, Adult, Care After These instructions provide you with information about caring for yourself after your procedure. Your health care provider may also give you more specific instructions. Your treatment has been planned according to current medical practices, but problems sometimes occur. Call your health care provider if you have any problems or questions after your procedure. What can I expect after the procedure? After the procedure, it is common to have:  Vomiting.  A sore throat.  Mental slowness.  It is common to feel:  Nauseous.  Cold or shivery.  Sleepy.  Tired.  Sore or achy, even in parts of your body where you did not have surgery.  Follow these instructions at home: For at least 24 hours after the procedure:  Do not: ? Participate in activities where you could fall or become injured. ? Drive. ? Use heavy machinery. ? Drink alcohol. ? Take sleeping pills or medicines that cause drowsiness. ? Make important decisions or sign legal documents. ? Take care of children on your own.  Rest. Eating and drinking  If you vomit, drink water, juice, or soup when you can drink without vomiting.  Drink enough fluid to keep your  urine clear or pale yellow.  Make sure you have little or no nausea before eating solid foods.  Follow the diet recommended by your health care provider. General instructions  Have a responsible adult stay with you until you are awake and alert.  Return to your normal activities as told by your health care provider. Ask your health care provider what activities are safe for you.  Take  over-the-counter and prescription medicines only as told by your health care provider.  If you smoke, do not smoke without supervision.  Keep all follow-up visits as told by your health care provider. This is important. Contact a health care provider if:  You continue to have nausea or vomiting at home, and medicines are not helpful.  You cannot drink fluids or start eating again.  You cannot urinate after 8-12 hours.  You develop a skin rash.  You have fever.  You have increasing redness at the site of your procedure. Get help right away if:  You have difficulty breathing.  You have chest pain.  You have unexpected bleeding.  You feel that you are having a life-threatening or urgent problem. This information is not intended to replace advice given to you by your health care provider. Make sure you discuss any questions you have with your health care provider. Document Released: 04/28/2000 Document Revised: 06/25/2015 Document Reviewed: 01/04/2015 Elsevier Interactive Patient Education  [employer].

## 2017-02-26 ENCOUNTER — Other Ambulatory Visit: Payer: Self-pay | Admitting: Obstetrics & Gynecology

## 2017-02-26 ENCOUNTER — Encounter (HOSPITAL_COMMUNITY)
Admission: RE | Admit: 2017-02-26 | Discharge: 2017-02-26 | Disposition: A | Discharge status: Home / Self Care | Payer: Self-pay | Source: Ambulatory Visit | Attending: Obstetrics & Gynecology | Admitting: Obstetrics & Gynecology

## 2017-02-26 DIAGNOSIS — Z01818 Encounter for other preprocedural examination: Secondary | ICD-10-CM | POA: Insufficient documentation

## 2017-02-26 DIAGNOSIS — R87613 High grade squamous intraepithelial lesion on cytologic smear of cervix (HGSIL): Secondary | ICD-10-CM | POA: Insufficient documentation

## 2017-02-26 LAB — RAPID URINE DRUG SCREEN, HOSP PERFORMED
Amphetamines: NOT DETECTED
BARBITURATES: NOT DETECTED
BENZODIAZEPINES: NOT DETECTED
Cocaine: POSITIVE — AB
Opiates: NOT DETECTED
Tetrahydrocannabinol: NOT DETECTED

## 2017-02-26 LAB — URINALYSIS, ROUTINE W REFLEX MICROSCOPIC
BILIRUBIN URINE: NEGATIVE
GLUCOSE, UA: NEGATIVE mg/dL
HGB URINE DIPSTICK: NEGATIVE
KETONES UR: NEGATIVE mg/dL
NITRITE: NEGATIVE
PH: 5 (ref 5.0–8.0)
Protein, ur: 30 mg/dL — AB
Specific Gravity, Urine: 1.023 (ref 1.005–1.030)

## 2017-02-26 NOTE — Progress Notes (Signed)
UDS performed.  Patient was informed that she would, again be tested for the presence of cocaine, as her previous procedure was cancelled for a positive result.  It was explained to her that it was a safety issue for her and she could not undergo anesthesia with cocaine in her system.  Patient states that she feels that someone has been placing cocaine in her drinks.  She was asked to have a seat in the wait for the results.  Upon receiving result, went to lobby to speak to patient, who had left.  Notified Angie, at the office.  She stated that the patient was in the office.  Made her aware that patient would have to be cancelled at this time, due to habitual positive result, per Dr Patsey Berthold.

## 2017-03-04 ENCOUNTER — Encounter (HOSPITAL_COMMUNITY): Admission: RE | Payer: Self-pay | Source: Ambulatory Visit

## 2017-03-04 ENCOUNTER — Ambulatory Visit (HOSPITAL_COMMUNITY): Admission: RE | Admit: 2017-03-04 | Payer: MEDICAID | Source: Ambulatory Visit | Admitting: Obstetrics & Gynecology

## 2017-03-04 SURGERY — CONE BIOPSY, CERVIX
Anesthesia: General

## 2017-03-24 NOTE — Patient Instructions (Signed)
Kristi Martin  03/24/2017     @PREFPERIOPPHARMACY @   Your procedure is scheduled on  04/01/2017   Report to [facility] at  36   A.M.  Call this number if you have problems the morning of surgery:  [phone]   Remember:  Do not eat food or drink liquids after midnight.  Take these medicines the morning of surgery with A SIP OF WATER  celexa.   Do not wear jewelry, make-up or nail polish.  Do not wear lotions, powders, or perfumes, or deodorant.  Do not shave 48 hours prior to surgery.  Men may shave face and neck.  Do not bring valuables to the hospital.  [facility] is not responsible for any belongings or valuables.  Contacts, dentures or bridgework may not be worn into surgery.  Leave your suitcase in the car.  After surgery it may be brought to your room.  For patients admitted to the hospital, discharge time will be determined by your treatment team.  Patients discharged the day of surgery will not be allowed to drive home.   Name and phone number of your driver:   family Special instructions:  None  Please read over the following fact sheets that you were given. Anesthesia Post-op Instructions and Care and Recovery After Surgery       Cervical Conization Cervical conization (cone biopsy) is a procedure in which a cone-shaped portion of the cervix is cut out so that it can be examined under a microscope. The procedure is done to check for cancer cells or cells that might turn into cancer (precancerous cells). You may have this procedure if:  You have abnormal bleeding from your cervix.  You had an abnormal Pap test.  Something abnormal was seen on your cervix during an exam.  This procedure is performed in either a health care provider's office or in an operating room. Tell a health care provider about:  Any allergies you have.  All medicines you are taking, including vitamins, herbs, eye drops, creams, and over-the-counter  medicines.  Any problems you or family members have had with the use of anesthetic medicines.  Any blood disorders you have.  Any surgeries you have had.  Any medical conditions you have.  Your smoking habits.  When you normally have your period.  Whether you are pregnant or may be pregnant. What are the risks? Generally, this is a safe procedure. However, problems may occur, including:  Heavy bleeding for several days or weeks after the procedure.  Allergic reactions to medicines or dyes.  Increased risk of preterm labor in future pregnancies.  Infection (rare).  Damage to the cervix or other structures or organs (rare).  What happens before the procedure? Staying hydrated Follow instructions from your health care provider about hydration, which may include:  Up to 2 hours before the procedure - you may continue to drink clear liquids, such as water, clear fruit juice, black coffee, and plain tea.  Eating and drinking restrictions Follow instructions from your health care provider about eating and drinking, which may include:  8 hours before the procedure - stop eating heavy meals or foods such as meat, fried foods, or fatty foods.  6 hours before the procedure - stop eating light meals or foods, such as toast or cereal.  6 hours before the procedure - stop drinking milk or drinks that contain milk.  2 hours before the procedure - stop drinking  clear liquids.  General instructions  Do not douche, have sex, use tampons, or use any vaginal medicines before the procedure as told by your health care provider.  You may be asked to empty your bladder and bowel right before the procedure.  Ask your health care provider about: ? Changing or stopping your normal medicines. This is important if you take diabetes medicines or blood thinners. ? Taking medicines such as aspirin and ibuprofen. These medicines can thin your blood. Do not take these medicines before your  procedure if your doctor tells you not to.  Plan to have someone take you home from the hospital or clinic. What happens during the procedure?  To reduce your risk of infection: ? Your health care team will wash or sanitize their hands. ? Your skin will be washed with soap. ? Hair may be removed from the surgical area.  You will undress from the waist down and be given a gown to wear.  You will lie on an examining table and put your feet in stirrups.  An IV tube will be inserted into one of your veins.  You will be given one or more of the following: ? A medicine to help you relax (sedative). ? A medicine to numb the area (local anesthetic). ? A medicine to make you fall asleep (general anesthetic). ? A medicine that numbs the cervix (cervical block).  A lubricated device called a speculum will be inserted into your vagina. It will be used to spread open the walls of the vagina so your health care provider can see the inside of the vagina and cervix better.  An instrument that has a magnifying lens and a light (colposcope) will let your health care provider examine the cervix more closely.  Your health care provider will apply a solution to your cervix. This turns abnormal areas a pale color.  A tissue sample will be removed from the cervix using one of the following methods: ? The cold knife method. In this method, the tissue is cut out with a knife (scalpel). ? The loop electrosurgical excision procedure (LEEP) method. In this method, the tissue is cut out with a thin wire that can burn (cauterize) the tissue with an electrical current. ? Laser treatment method. In this method, the tissue is cut out and then cauterized with a laser beam to prevent bleeding.  Your health care provider will apply a paste over the biopsy areas to help control bleeding.  The tissue sample will be examined under a microscope. The procedure may vary among health care providers and hospitals. What  happens after the procedure?  Your blood pressure, heart rate, breathing rate, and blood oxygen level will be monitored often until the medicines you were given have worn off.  If you were given a local anesthetic, you will rest at the clinic or hospital until you are stable and feel ready to go home.  If you were given a general anesthetic, you may be monitored for a longer period of time.  You may have some cramping.  You may have bloody discharge or light to moderate bleeding.  You may have dark discharge coming from your vagina. This is from the paste used on the cervix to prevent bleeding. Summary  Cervical conization is a procedure in which a cone-shaped portion of the cervix is cut out so that it can be examined under a microscope.  The procedure is done to check for cancer cells or cells that might turn into  cancer (precancerous cells). This information is not intended to replace advice given to you by your health care provider. Make sure you discuss any questions you have with your health care provider. Document Released: 10/30/2004 Document Revised: 01/23/2016 Document Reviewed: 01/23/2016 Elsevier Interactive Patient Education  2017 [employer]  Cervical Conization, Care After This sheet gives you information about how to care for yourself after your procedure. Your doctor may also give you more specific instructions. If you have problems or questions, contact your doctor. Follow these instructions at home: Medicines  Take over-the-counter and prescription medicines only as told by your doctor.  Do not take aspirin until your doctor says it is okay.  If you take pain medicine: ? You may have constipation. To help treat this, your doctor may tell you to:  Drink enough fluid to keep your pee (urine) clear or pale yellow.  Take medicines.  Eat foods that are high in fiber. These include fresh fruits and vegetables, whole grains, bran, and beans.  Limit foods that  are high in fat and sugar. These include fried foods and sweet foods. ? Do not drive or use heavy machines. General instructions  You can eat your usual diet unless your doctor tells you not to do so.  Take showers for the first week. Do not take baths, swim, or use hot tubs until your doctor says it is okay.  Do not douche, use tampons, or have sex until your doctor says it is okay.  For 7-14 days after your procedure, avoid: ? Being very active. ? Exercising. ? Heavy lifting.  Keep all follow-up visits as told by your doctor. This is important. Contact a doctor if:  You have a rash.  You are dizzy or lightheaded.  You feel sick to your stomach (nauseous).  You throw up (vomit).  You have fluid from your vagina (vaginal discharge) that smells bad. Get help right away if:  There are blood clots coming from your vagina.  You have more bleeding than you would have in a normal period. For example, you soak a pad in less than 1 hour.  You have a fever.  You have more and more cramps.  You pass out (faint).  You have pain when peeing.  Your have a lot of pain.  Your pain gets worse.  Your pain does not get better when you take your medicine.  You have blood in your pee.  You throw up (vomit). Summary  After your procedure, take over-the-counter and prescription medicines only as told by your doctor.  Do not douche, use tampons, or have sex until your doctor says it is okay.  For about 7-14 days after your procedure, try not to exercise or lift heavy objects.  Get help right away if you have new symptoms, or if your symptoms become worse. This information is not intended to replace advice given to you by your health care provider. Make sure you discuss any questions you have with your health care provider. Document Released: 10/30/2007 Document Revised: 01/23/2016 Document Reviewed: 01/23/2016 Elsevier Interactive Patient Education  2017 [place] Anesthesia, Adult General anesthesia is the use of medicines to make a person "go to sleep" (be unconscious) for a medical procedure. General anesthesia is often recommended when a procedure:  Is long.  Requires you to be still or in an unusual position.  Is major and can cause you to lose blood.  Is impossible to do without general anesthesia.  The medicines  used for general anesthesia are called general anesthetics. In addition to making you sleep, the medicines:  Prevent pain.  Control your blood pressure.  Relax your muscles.  Tell a health care provider about:  Any allergies you have.  All medicines you are taking, including vitamins, herbs, eye drops, creams, and over-the-counter medicines.  Any problems you or family members have had with anesthetic medicines.  Types of anesthetics you have had in the past.  Any bleeding disorders you have.  Any surgeries you have had.  Any medical conditions you have.  Any history of heart or lung conditions, such as heart failure, sleep apnea, or chronic obstructive pulmonary disease (COPD).  Whether you are pregnant or may be pregnant.  Whether you use tobacco, alcohol, marijuana, or street drugs.  Any history of Armed forces logistics/support/administrative officer.  Any history of depression or anxiety. What are the risks? Generally, this is a safe procedure. However, problems may occur, including:  Allergic reaction to anesthetics.  Lung and heart problems.  Inhaling food or liquids from your stomach into your lungs (aspiration).  Injury to nerves.  Waking up during your procedure and being unable to move (rare).  Extreme agitation or a state of mental confusion (delirium) when you wake up from the anesthetic.  Air in the bloodstream, which can lead to stroke.  These problems are more likely to develop if you are having a major surgery or if you have an advanced medical condition. You can prevent some of these complications by  answering all of your health care provider's questions thoroughly and by following all pre-procedure instructions. General anesthesia can cause side effects, including:  Nausea or vomiting  A sore throat from the breathing tube.  Feeling cold or shivery.  Feeling tired, washed out, or achy.  Sleepiness or drowsiness.  Confusion or agitation.  What happens before the procedure? Staying hydrated Follow instructions from your health care provider about hydration, which may include:  Up to 2 hours before the procedure - you may continue to drink clear liquids, such as water, clear fruit juice, black coffee, and plain tea.  Eating and drinking restrictions Follow instructions from your health care provider about eating and drinking, which may include:  8 hours before the procedure - stop eating heavy meals or foods such as meat, fried foods, or fatty foods.  6 hours before the procedure - stop eating light meals or foods, such as toast or cereal.  6 hours before the procedure - stop drinking milk or drinks that contain milk.  2 hours before the procedure - stop drinking clear liquids.  Medicines  Ask your health care provider about: ? Changing or stopping your regular medicines. This is especially important if you are taking diabetes medicines or blood thinners. ? Taking medicines such as aspirin and ibuprofen. These medicines can thin your blood. Do not take these medicines before your procedure if your health care provider instructs you not to. ? Taking new dietary supplements or medicines. Do not take these during the week before your procedure unless your health care provider approves them.  If you are told to take a medicine or to continue taking a medicine on the day of the procedure, take the medicine with sips of water. General instructions   Ask if you will be going home the same day, the following day, or after a longer hospital stay. ? Plan to have someone take you  home. ? Plan to have someone stay with you for the first 24  hours after you leave the hospital or clinic.  For 3-6 weeks before the procedure, try not to use any tobacco products, such as cigarettes, chewing tobacco, and e-cigarettes.  You may brush your teeth on the morning of the procedure, but make sure to spit out the toothpaste. What happens during the procedure?  You will be given anesthetics through a mask and through an IV tube in one of your veins.  You may receive medicine to help you relax (sedative).  As soon as you are asleep, a breathing tube may be used to help you breathe.  An anesthesia specialist will stay with you throughout the procedure. He or she will help keep you comfortable and safe by continuing to give you medicines and adjusting the amount of medicine that you get. He or she will also watch your blood pressure, pulse, and oxygen levels to make sure that the anesthetics do not cause any problems.  If a breathing tube was used to help you breathe, it will be removed before you wake up. The procedure may vary among health care providers and hospitals. What happens after the procedure?  You will wake up, often slowly, after the procedure is complete, usually in a recovery area.  Your blood pressure, heart rate, breathing rate, and blood oxygen level will be monitored until the medicines you were given have worn off.  You may be given medicine to help you calm down if you feel anxious or agitated.  If you will be going home the same day, your health care provider may check to make sure you can stand, drink, and urinate.  Your health care providers will treat your pain and side effects before you go home.  Do not drive for 24 hours if you received a sedative.  You may: ? Feel nauseous and vomit. ? Have a sore throat. ? Have mental slowness. ? Feel cold or shivery. ? Feel sleepy. ? Feel tired. ? Feel sore or achy, even in parts of your body where you did  not have surgery. This information is not intended to replace advice given to you by your health care provider. Make sure you discuss any questions you have with your health care provider. Document Released: 04/29/2007 Document Revised: 07/03/2015 Document Reviewed: 01/04/2015 Elsevier Interactive Patient Education  2018 [place] Anesthesia, Adult, Care After These instructions provide you with information about caring for yourself after your procedure. Your health care provider may also give you more specific instructions. Your treatment has been planned according to current medical practices, but problems sometimes occur. Call your health care provider if you have any problems or questions after your procedure. What can I expect after the procedure? After the procedure, it is common to have:  Vomiting.  A sore throat.  Mental slowness.  It is common to feel:  Nauseous.  Cold or shivery.  Sleepy.  Tired.  Sore or achy, even in parts of your body where you did not have surgery.  Follow these instructions at home: For at least 24 hours after the procedure:  Do not: ? Participate in activities where you could fall or become injured. ? Drive. ? Use heavy machinery. ? Drink alcohol. ? Take sleeping pills or medicines that cause drowsiness. ? Make important decisions or sign legal documents. ? Take care of children on your own.  Rest. Eating and drinking  If you vomit, drink water, juice, or soup when you can drink without vomiting.  Drink enough fluid to keep your  urine clear or pale yellow.  Make sure you have little or no nausea before eating solid foods.  Follow the diet recommended by your health care provider. General instructions  Have a responsible adult stay with you until you are awake and alert.  Return to your normal activities as told by your health care provider. Ask your health care provider what activities are safe for you.  Take  over-the-counter and prescription medicines only as told by your health care provider.  If you smoke, do not smoke without supervision.  Keep all follow-up visits as told by your health care provider. This is important. Contact a health care provider if:  You continue to have nausea or vomiting at home, and medicines are not helpful.  You cannot drink fluids or start eating again.  You cannot urinate after 8-12 hours.  You develop a skin rash.  You have fever.  You have increasing redness at the site of your procedure. Get help right away if:  You have difficulty breathing.  You have chest pain.  You have unexpected bleeding.  You feel that you are having a life-threatening or urgent problem. This information is not intended to replace advice given to you by your health care provider. Make sure you discuss any questions you have with your health care provider. Document Released: 04/28/2000 Document Revised: 06/25/2015 Document Reviewed: 01/04/2015 Elsevier Interactive Patient Education  [employer].

## 2017-03-26 ENCOUNTER — Encounter (HOSPITAL_COMMUNITY): Payer: Self-pay

## 2017-03-26 ENCOUNTER — Encounter (HOSPITAL_COMMUNITY)
Admission: RE | Admit: 2017-03-26 | Discharge: 2017-03-26 | Disposition: A | Discharge status: Home / Self Care | Payer: Self-pay | Source: Ambulatory Visit | Attending: Obstetrics & Gynecology | Admitting: Obstetrics & Gynecology

## 2017-03-26 ENCOUNTER — Other Ambulatory Visit: Payer: Self-pay

## 2017-03-26 ENCOUNTER — Encounter: Payer: Self-pay | Admitting: Physician Assistant

## 2017-03-26 ENCOUNTER — Ambulatory Visit: Payer: Self-pay | Admitting: Physician Assistant

## 2017-03-26 VITALS — BP 106/66 | HR 67 | Temp 97.7°F | Ht 65.0 in | Wt 261.0 lb

## 2017-03-26 DIAGNOSIS — K219 Gastro-esophageal reflux disease without esophagitis: Secondary | ICD-10-CM

## 2017-03-26 DIAGNOSIS — F1910 Other psychoactive substance abuse, uncomplicated: Secondary | ICD-10-CM

## 2017-03-26 DIAGNOSIS — N189 Chronic kidney disease, unspecified: Secondary | ICD-10-CM

## 2017-03-26 DIAGNOSIS — F419 Anxiety disorder, unspecified: Secondary | ICD-10-CM

## 2017-03-26 DIAGNOSIS — Z01812 Encounter for preprocedural laboratory examination: Secondary | ICD-10-CM | POA: Insufficient documentation

## 2017-03-26 LAB — URINALYSIS, ROUTINE W REFLEX MICROSCOPIC
BACTERIA UA: NONE SEEN
Bilirubin Urine: NEGATIVE
GLUCOSE, UA: NEGATIVE mg/dL
KETONES UR: NEGATIVE mg/dL
Leukocytes, UA: NEGATIVE
Nitrite: NEGATIVE
PROTEIN: NEGATIVE mg/dL
RBC / HPF: NONE SEEN RBC/hpf (ref 0–5)
Specific Gravity, Urine: 1.013 (ref 1.005–1.030)
pH: 9 — ABNORMAL HIGH (ref 5.0–8.0)

## 2017-03-26 LAB — COMPREHENSIVE METABOLIC PANEL
ALBUMIN: 2.8 g/dL — AB (ref 3.5–5.0)
ALT: 10 U/L — AB (ref 14–54)
AST: 14 U/L — ABNORMAL LOW (ref 15–41)
Alkaline Phosphatase: 55 U/L (ref 38–126)
Anion gap: 8 (ref 5–15)
BUN: 8 mg/dL (ref 6–20)
CHLORIDE: 108 mmol/L (ref 101–111)
CO2: 24 mmol/L (ref 22–32)
CREATININE: 1.14 mg/dL — AB (ref 0.44–1.00)
Calcium: 8 mg/dL — ABNORMAL LOW (ref 8.9–10.3)
GFR calc Af Amer: >60 mL/min (ref >60)
GFR calc non Af Amer: 56 mL/min — ABNORMAL LOW (ref >60)
GLUCOSE: 101 mg/dL — AB (ref 65–99)
POTASSIUM: 3.5 mmol/L (ref 3.5–5.1)
Sodium: 140 mmol/L (ref 135–145)
Total Bilirubin: 0.2 mg/dL — ABNORMAL LOW (ref 0.3–1.2)
Total Protein: 5.8 g/dL — ABNORMAL LOW (ref 6.5–8.1)

## 2017-03-26 LAB — RAPID URINE DRUG SCREEN, HOSP PERFORMED
Amphetamines: NOT DETECTED
Barbiturates: NOT DETECTED
Benzodiazepines: NOT DETECTED
Cocaine: NOT DETECTED
Opiates: NOT DETECTED
TETRAHYDROCANNABINOL: NOT DETECTED

## 2017-03-26 LAB — CBC WITH DIFFERENTIAL/PLATELET
BASOS ABS: 0 10*3/uL (ref 0.0–0.1)
BASOS PCT: 1 %
EOS PCT: 2 %
Eosinophils Absolute: 0.1 10*3/uL (ref 0.0–0.7)
HEMATOCRIT: 38.7 % (ref 36.0–46.0)
Hemoglobin: 12.1 g/dL (ref 12.0–15.0)
LYMPHS PCT: 34 %
Lymphs Abs: 2 10*3/uL (ref 0.7–4.0)
MCH: 27.1 pg (ref 26.0–34.0)
MCHC: 31.3 g/dL (ref 30.0–36.0)
MCV: 86.6 fL (ref 78.0–100.0)
MONO ABS: 0.5 10*3/uL (ref 0.1–1.0)
Monocytes Relative: 9 %
NEUTROS ABS: 3.3 10*3/uL (ref 1.7–7.7)
Neutrophils Relative %: 54 %
PLATELETS: 235 10*3/uL (ref 150–400)
RBC: 4.47 MIL/uL (ref 3.87–5.11)
RDW: 15.1 % (ref 11.5–15.5)
WBC: 6 10*3/uL (ref 4.0–10.5)

## 2017-03-26 LAB — HCG, SERUM, QUALITATIVE: PREG SERUM: NEGATIVE
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
## 2017-04-01 ENCOUNTER — Ambulatory Visit (HOSPITAL_COMMUNITY): Payer: Self-pay | Admitting: Anesthesiology

## 2017-04-01 ENCOUNTER — Encounter (HOSPITAL_COMMUNITY): Payer: Self-pay | Admitting: *Deleted

## 2017-04-01 ENCOUNTER — Ambulatory Visit (HOSPITAL_COMMUNITY)
Admission: RE | Admit: 2017-04-01 | Discharge: 2017-04-01 | Disposition: A | Discharge status: Home / Self Care | Payer: Self-pay | Source: Ambulatory Visit | Attending: Obstetrics & Gynecology | Admitting: Obstetrics & Gynecology

## 2017-04-01 ENCOUNTER — Encounter (HOSPITAL_COMMUNITY)
Admission: RE | Disposition: A | Discharge status: Home / Self Care | Payer: Self-pay | Source: Ambulatory Visit | Attending: Obstetrics & Gynecology

## 2017-04-01 DIAGNOSIS — E669 Obesity, unspecified: Secondary | ICD-10-CM | POA: Insufficient documentation

## 2017-04-01 DIAGNOSIS — Z6841 Body Mass Index (BMI) 40.0 and over, adult: Secondary | ICD-10-CM | POA: Insufficient documentation

## 2017-04-01 DIAGNOSIS — D067 Carcinoma in situ of other parts of cervix: Secondary | ICD-10-CM | POA: Insufficient documentation

## 2017-04-01 DIAGNOSIS — Z8541 Personal history of malignant neoplasm of cervix uteri: Secondary | ICD-10-CM | POA: Insufficient documentation

## 2017-04-01 DIAGNOSIS — N183 Chronic kidney disease, stage 3 (moderate): Secondary | ICD-10-CM | POA: Insufficient documentation

## 2017-04-01 DIAGNOSIS — D060 Carcinoma in situ of endocervix: Secondary | ICD-10-CM

## 2017-04-01 DIAGNOSIS — G629 Polyneuropathy, unspecified: Secondary | ICD-10-CM | POA: Insufficient documentation

## 2017-04-01 HISTORY — PX: CERVICAL CONIZATION W/BX: SHX1330

## 2017-04-01 LAB — RAPID URINE DRUG SCREEN, HOSP PERFORMED
AMPHETAMINES: NOT DETECTED
Barbiturates: NOT DETECTED
Benzodiazepines: NOT DETECTED
COCAINE: NOT DETECTED
OPIATES: NOT DETECTED
TETRAHYDROCANNABINOL: NOT DETECTED

## 2017-04-01 SURGERY — CONE BIOPSY, CERVIX
Anesthesia: General | Site: Vagina

## 2017-04-01 MED ORDER — FENTANYL CITRATE (PF) 100 MCG/2ML IJ SOLN
INTRAMUSCULAR | Status: DC | PRN
  Administered 2017-04-01 (×4): 50 ug via INTRAVENOUS

## 2017-04-01 MED ORDER — ACETIC ACID 5 % SOLN
Status: DC | PRN
  Administered 2017-04-01: 1 via TOPICAL

## 2017-04-01 MED ORDER — GLYCOPYRROLATE 0.2 MG/ML IJ SOLN
INTRAMUSCULAR | Status: AC
  Filled 2017-04-01: qty 4

## 2017-04-01 MED ORDER — LACTATED RINGERS IV SOLN: INTRAVENOUS | Status: DC

## 2017-04-01 MED ORDER — CEFAZOLIN SODIUM-DEXTROSE 2-4 GM/100ML-% IV SOLN
2.0000 g | INTRAVENOUS | Status: DC
  Filled 2017-04-01: qty 100

## 2017-04-01 MED ORDER — PROPOFOL 10 MG/ML IV BOLUS
INTRAVENOUS | Status: AC
  Filled 2017-04-01: qty 20

## 2017-04-01 MED ORDER — KETAMINE HCL 10 MG/ML IJ SOLN
INTRAMUSCULAR | Status: AC
  Filled 2017-04-01: qty 1

## 2017-04-01 MED ORDER — HYDROMORPHONE HCL 1 MG/ML IJ SOLN: 0.2500 mg | INTRAMUSCULAR | Status: DC | PRN

## 2017-04-01 MED ORDER — OXYCODONE HCL 5 MG PO TABS
5.0000 mg | ORAL_TABLET | Freq: Once | ORAL | Status: AC | PRN
  Administered 2017-04-01: 5 mg via ORAL
  Filled 2017-04-01: qty 1

## 2017-04-01 MED ORDER — ONDANSETRON 8 MG PO TBDP: 8.0000 mg | ORAL_TABLET | Freq: Three times a day (TID) | ORAL | 0 refills | Status: DC | PRN

## 2017-04-01 MED ORDER — SUCCINYLCHOLINE CHLORIDE 20 MG/ML IJ SOLN
INTRAMUSCULAR | Status: DC | PRN
  Administered 2017-04-01: 120 mg via INTRAVENOUS

## 2017-04-01 MED ORDER — LIDOCAINE HCL (CARDIAC) 20 MG/ML IV SOLN
INTRAVENOUS | Status: DC | PRN
  Administered 2017-04-01: 40 mg via INTRAVENOUS

## 2017-04-01 MED ORDER — MIDAZOLAM HCL 2 MG/2ML IJ SOLN
INTRAMUSCULAR | Status: AC
  Filled 2017-04-01: qty 2

## 2017-04-01 MED ORDER — KETAMINE HCL 50 MG/ML IJ SOLN
INTRAMUSCULAR | Status: DC | PRN
  Administered 2017-04-01 (×2): 10 mg via INTRAMUSCULAR

## 2017-04-01 MED ORDER — OXYCODONE HCL 5 MG/5ML PO SOLN: 5.0000 mg | Freq: Once | ORAL | Status: DC | PRN

## 2017-04-01 MED ORDER — ONDANSETRON 4 MG PO TBDP
ORAL_TABLET | ORAL | Status: AC
Start: 2017-04-01 — End: ?
  Filled 2017-04-01: qty 1

## 2017-04-01 MED ORDER — NEOSTIGMINE METHYLSULFATE 10 MG/10ML IV SOLN
INTRAVENOUS | Status: AC
  Filled 2017-04-01: qty 2

## 2017-04-01 MED ORDER — PROPOFOL 10 MG/ML IV BOLUS
INTRAVENOUS | Status: DC | PRN
  Administered 2017-04-01: 200 mg via INTRAVENOUS

## 2017-04-01 MED ORDER — MIDAZOLAM HCL 2 MG/2ML IJ SOLN
1.0000 mg | Freq: Once | INTRAMUSCULAR | Status: AC | PRN
  Administered 2017-04-01: 2 mg via INTRAVENOUS

## 2017-04-01 MED ORDER — NEOSTIGMINE METHYLSULFATE 10 MG/10ML IV SOLN
INTRAVENOUS | Status: DC | PRN
  Administered 2017-04-01: .5 mg via INTRAVENOUS

## 2017-04-01 MED ORDER — LACTATED RINGERS IV SOLN
INTRAVENOUS | Status: DC | PRN
  Administered 2017-04-01: 10:00:00 via INTRAVENOUS

## 2017-04-01 MED ORDER — GLYCOPYRROLATE 0.2 MG/ML IJ SOLN
INTRAMUSCULAR | Status: DC | PRN
  Administered 2017-04-01: .4 mg via INTRAVENOUS

## 2017-04-01 MED ORDER — KETOROLAC TROMETHAMINE 30 MG/ML IJ SOLN
30.0000 mg | Freq: Once | INTRAMUSCULAR | Status: AC
  Administered 2017-04-01: 30 mg via INTRAVENOUS
  Filled 2017-04-01: qty 1

## 2017-04-01 MED ORDER — ROCURONIUM BROMIDE 100 MG/10ML IV SOLN
INTRAVENOUS | Status: DC | PRN
  Administered 2017-04-01: 5 mg via INTRAVENOUS
  Administered 2017-04-01: 20 mg via INTRAVENOUS

## 2017-04-01 MED ORDER — FENTANYL CITRATE (PF) 250 MCG/5ML IJ SOLN
INTRAMUSCULAR | Status: AC
  Filled 2017-04-01: qty 5

## 2017-04-01 MED ORDER — LIDOCAINE HCL (PF) 1 % IJ SOLN
INTRAMUSCULAR | Status: AC
  Filled 2017-04-01: qty 5

## 2017-04-01 MED ORDER — FERRIC SUBSULFATE 259 MG/GM EX SOLN
CUTANEOUS | Status: AC
  Filled 2017-04-01: qty 8

## 2017-04-01 MED ORDER — ROCURONIUM BROMIDE 50 MG/5ML IV SOLN
INTRAVENOUS | Status: AC
  Filled 2017-04-01: qty 1

## 2017-04-01 MED ORDER — ONDANSETRON 4 MG PO TBDP
4.0000 mg | ORAL_TABLET | Freq: Once | ORAL | Status: AC
  Administered 2017-04-01: 4 mg via ORAL

## 2017-04-01 MED ORDER — SUCCINYLCHOLINE CHLORIDE 20 MG/ML IJ SOLN
INTRAMUSCULAR | Status: AC
  Filled 2017-04-01: qty 1

## 2017-04-01 MED ORDER — KETOROLAC TROMETHAMINE 10 MG PO TABS: 10.0000 mg | ORAL_TABLET | Freq: Three times a day (TID) | ORAL | 0 refills | Status: DC | PRN

## 2017-04-01 SURGICAL SUPPLY — 28 items
CLOTH BEACON ORANGE TIMEOUT ST (SAFETY) ×3 IMPLANT
COAGULATOR SUCT 6 FR SWTCH (ELECTROSURGICAL)
COAGULATOR SUCT SWTCH 10FR 6 (ELECTROSURGICAL) IMPLANT
COVER LIGHT HANDLE STERIS (MISCELLANEOUS) ×3 IMPLANT
ELECT REM PT RETURN 9FT ADLT (ELECTROSURGICAL) ×3
ELECTRODE REM PT RTRN 9FT ADLT (ELECTROSURGICAL) ×1 IMPLANT
FORMALIN 10 PREFIL 120ML (MISCELLANEOUS) ×3 IMPLANT
GLOVE BIOGEL PI IND STRL 7.0 (GLOVE) ×1 IMPLANT
GLOVE BIOGEL PI IND STRL 8 (GLOVE) ×1 IMPLANT
GLOVE BIOGEL PI INDICATOR 7.0 (GLOVE) ×2
GLOVE BIOGEL PI INDICATOR 8 (GLOVE) ×2
GLOVE ECLIPSE 8.0 STRL XLNG CF (GLOVE) ×3 IMPLANT
GOWN STRL REUS W/TWL LRG LVL3 (GOWN DISPOSABLE) ×3 IMPLANT
GOWN STRL REUS W/TWL XL LVL3 (GOWN DISPOSABLE) ×3 IMPLANT
KIT TURNOVER KIT A (KITS) ×3 IMPLANT
LASER FIBER DISP 1000U (UROLOGICAL SUPPLIES) ×3 IMPLANT
MANIFOLD NEPTUNE II (INSTRUMENTS) ×3 IMPLANT
MARKER SKIN DUAL TIP RULER LAB (MISCELLANEOUS) ×3 IMPLANT
PACK BASIC III (CUSTOM PROCEDURE TRAY) ×3
PACK SRG BSC III STRL LF ECLPS (CUSTOM PROCEDURE TRAY) ×1 IMPLANT
PAD ARMBOARD 7.5X6 YLW CONV (MISCELLANEOUS) ×3 IMPLANT
PREFILTER SMOKE EVAC (FILTER) ×3 IMPLANT
SCOPETTES 8  STERILE (MISCELLANEOUS) ×2
SCOPETTES 8 STERILE (MISCELLANEOUS) ×1 IMPLANT
SET BASIN LINEN APH (SET/KITS/TRAYS/PACK) ×3 IMPLANT
TOWEL OR 17X26 4PK STRL BLUE (TOWEL DISPOSABLE) ×3 IMPLANT
TUBING SMOKE EVAC CO2 (TUBING) ×3 IMPLANT
WATER STERILE IRR 1000ML POUR (IV SOLUTION) ×3 IMPLANT

## 2017-04-01 NOTE — Anesthesia Preprocedure Evaluation (Signed)
Anesthesia Evaluation  Patient identified by MRN, date of birth, ID band Patient awake    Airway Mallampati: I  TM Distance: >3 FB Neck ROM: Full    Dental  (+) Missing, Poor Dentition   Pulmonary asthma ,    Pulmonary exam normal breath sounds clear to auscultation       Cardiovascular Normal cardiovascular exam Rhythm:Regular Rate:Normal  Study Conclusions  (Feb 2016)  - Left ventricle: The cavity size was normal. Wall thickness was normal. Systolic function was normal. The estimated ejection fraction was in the range of 60% to 65%. Wall motion was normal; there were no regional wall motion abnormalities. Left ventricular diastolic function parameters were normal. - Mitral valve: There was mild regurgitation. Valve area by continuity equation (using LVOT flow): 0.78 cm^2. - Pulmonary arteries: Systolic pressure was mildly increased. PA peak pressure: 37 mm Hg (S).  ECG:  NSR   (Sept 2018)   Neuro/Psych Anxiety Depression Drug abuse, recent + cocaine, will re-check today.   GI/Hepatic GERD  ,Results for RANEEN, JAFFER (MRN [id number]) as of 04/01/2017 09:08  03/26/2017 12:50 Calcium: 8.0 (L) Anion gap: 8 Alkaline Phosphatase: 55 Albumin: 2.8 (L) AST: 14 (L) ALT: 10 (L) Total Protein: 5.8 (L) Total Bilirubin: 0.2 (L)    Endo/Other    Renal/GU Renal InsufficiencyRenal diseaseResults for TWANISHA, FOULK (MRN [id number]) as of 04/01/2017 09:08  03/26/2017 12:50 Sodium: 140 Potassium: 3.5 Chloride: 108 CO2: 24 Glucose: 101 (H) BUN: 8 Creatinine: 1.14 (H)      Musculoskeletal  (+) Arthritis , Osteoarthritis,    Abdominal (+) + obese,   Peds  Hematology  (+) anemia , Results for WILLEAN, SCHURMAN (MRN [id number]) as of 04/01/2017 09:08  03/26/2017 12:50 WBC: 6.0 RBC: 4.47 Hemoglobin: 12.1 HCT: 38.7 MCV: 86.6 MCH: 27.1 MCHC: 31.3 RDW: 15.1 Platelets: 235    Anesthesia Other  Findings Results for CARRIEANNE, KLEEN (MRN [id number]) as of 04/01/2017 09:08  03/26/2017 12:50 Amphetamines: NONE DETECTED Barbiturates: NONE DETECTED Benzodiazepines: NONE DETECTED Opiates: NONE DETECTED COCAINE: NONE DETECTED Tetrahydrocannabinol: NONE DETECTED  Results for SHARIKA, MOSQUERA (MRN [id number]) as of 04/01/2017 09:08  03/26/2017 12:50 Preg, Serum: NEGATIVE    Reproductive/Obstetrics                             Anesthesia Physical Anesthesia Plan  ASA: IV  Anesthesia Plan: General   Post-op Pain Management:    Induction:   PONV Risk Score and Plan:   Airway Management Planned: Oral ETT  Additional Equipment:   Intra-op Plan:   Post-operative Plan: Extubation in OR  Informed Consent: I have reviewed the patients History and Physical, chart, labs and discussed the procedure including the risks, benefits and alternatives for the proposed anesthesia with the patient or authorized representative who has indicated his/her understanding and acceptance.   Dental advisory given  Plan Discussed with: CRNA and Surgeon  Anesthesia Plan Comments:         Anesthesia Quick Evaluation

## 2017-04-01 NOTE — Anesthesia Postprocedure Evaluation (Signed)
Anesthesia Post Note  Patient: Kristi Martin  Procedure(s) Performed: LASER CONIZATION CERVIX WITH BIOPSY (N/A Vagina )  Patient location during evaluation: PACU Anesthesia Type: General Level of consciousness: awake and alert and oriented Pain management: pain level controlled Vital Signs Assessment: post-procedure vital signs reviewed and stable Respiratory status: spontaneous breathing Cardiovascular status: blood pressure returned to baseline and stable Postop Assessment: no apparent nausea or vomiting Anesthetic complications: no     Last Vitals:  Vitals:   04/01/17 1230 04/01/17 1248  BP: 96/71   Pulse: 83 74  Resp: (!) 21 18  Temp:  (!) 36.4 C  SpO2: 97% 93%    Last Pain:  Vitals:   04/01/17 1248  TempSrc: Oral  PainSc: 8                  Gizelle Whetsel

## 2017-04-01 NOTE — Anesthesia Procedure Notes (Signed)
Procedure Name: Intubation Date/Time: 04/01/2017 10:58 AM Performed by: Ollen Bowl, CRNA Pre-anesthesia Checklist: Patient identified, Patient being monitored, Timeout performed, Emergency Drugs available and Suction available Patient Re-evaluated:Patient Re-evaluated prior to induction Oxygen Delivery Method: Circle system utilized Preoxygenation: Pre-oxygenation with 100% oxygen Induction Type: IV induction Ventilation: Mask ventilation without difficulty Laryngoscope Size: Mac and 3 Grade View: Grade I Tube type: Oral Tube size: 7.0 mm Number of attempts: 1 Airway Equipment and Method: Stylet Placement Confirmation: ETT inserted through vocal cords under direct vision,  positive ETCO2 and breath sounds checked- equal and bilateral Secured at: 21 cm Tube secured with: Tape Dental Injury: Teeth and Oropharynx as per pre-operative assessment

## 2017-04-01 NOTE — Transfer of Care (Signed)
Immediate Anesthesia Transfer of Care Note  Patient: Kristi Martin  Procedure(s) Performed: LASER CONIZATION CERVIX WITH BIOPSY (N/A Vagina )  Patient Location: PACU  Anesthesia Type:General  Level of Consciousness: awake, alert  and oriented  Airway & Oxygen Therapy: Patient Spontanous Breathing and Patient connected to face mask oxygen  Post-op Assessment: Report given to RN  Post vital signs: Reviewed and stable  Last Vitals:  Vitals:   04/01/17 1030 04/01/17 1045  BP: 112/77   Resp: 19 10  Temp:    SpO2: 98% 98%    Last Pain:  Vitals:   04/01/17 0919  TempSrc: Oral      Patients Stated Pain Goal: 10 (93/26/71 2458)  Complications: No apparent anesthesia complications

## 2017-04-01 NOTE — H&P (Signed)
Preoperative History and Physical  Kristi Martin is a 47 y.o. [phone] with Patient's last menstrual period was 03/04/2017 (approximate). admitted for a laser conization of the cervix due to cervical lesion extending into the endocervical canal, therefore inadequate colposcopy..  Needs diagnostic conization  PMH:    Past Medical History:  Diagnosis Date  . Anemia   . Anxiety   . Asthma    as child  . Cancer ([place])    cervical cx at age 28  . Cervical cancer ([place])   . Chronic back pain   . Chronic kidney disease   . CKD (chronic kidney disease), symptom management only, stage 3 (moderate) ([place])   . DDD (degenerative disc disease), lumbar    Facet arthritis  . GERD (gastroesophageal reflux disease)   . Hypotension   . Left knee pain   . Migraine   . Peripheral neuropathy   . Sciatica   . Vaginal Pap smear, abnormal     PSH:     Past Surgical History:  Procedure Laterality Date  . CHOLECYSTECTOMY N/A 01/20/2013   Procedure: LAPAROSCOPIC CHOLECYSTECTOMY WITH INTRAOPERATIVE CHOLANGIOGRAM;  Surgeon: Shann Medal, MD;  Location: [facility];  Service: General;  Laterality: N/A;  . TUBAL LIGATION      POb/GynH:      OB History    Gravida Para Term Preterm AB Living   9 7 5 2 2 7    SAB TAB Ectopic Multiple Live Births   2       1      SH:   Social History   Tobacco Use  . Smoking status: Never Smoker  . Smokeless tobacco: Never Used  Substance Use Topics  . Alcohol use: Yes    Alcohol/week: 0.0 oz    Comment: occasional  . Drug use: Yes    Types: "Crack" cocaine    FH:    Family History  Problem Relation Age of Onset  . Cancer Mother   . Colon cancer Mother   . Breast cancer Maternal Grandmother   . Heart disease Maternal Grandmother   . Cancer Maternal Grandmother   . Breast cancer Paternal Grandmother   . Heart disease Paternal Grandmother   . Hypertension Paternal Grandmother   . Alcohol abuse Father   . Drug abuse Father   . Heart disease Father    . Asthma Brother   . Cancer Paternal Grandfather      Allergies:  Allergies  Allergen Reactions  . Benadryl [Diphenhydramine Hcl] Anaphylaxis, Swelling and Other (See Comments)    Back of throat closes  . Onion Anaphylaxis    Medications:       Current Facility-Administered Medications:  .  ceFAZolin (ANCEF) IVPB 2g/100 mL premix, 2 g, Intravenous, On Call to OR, Florian Buff, MD  Review of Systems:   Review of Systems  Constitutional: Negative for fever, chills, weight loss, malaise/fatigue and diaphoresis.  HENT: Negative for hearing loss, ear pain, nosebleeds, congestion, sore throat, neck pain, tinnitus and ear discharge.   Eyes: Negative for blurred vision, double vision, photophobia, pain, discharge and redness.  Respiratory: Negative for cough, hemoptysis, sputum production, shortness of breath, wheezing and stridor.   Cardiovascular: Negative for chest pain, palpitations, orthopnea, claudication, leg swelling and PND.  Gastrointestinal: Positive for abdominal pain. Negative for heartburn, nausea, vomiting, diarrhea, constipation, blood in stool and melena.  Genitourinary: Negative for dysuria, urgency, frequency, hematuria and flank pain.  Musculoskeletal: Negative for myalgias, back pain, joint pain and falls.  Skin: Negative for itching and rash.  Neurological: Negative for dizziness, tingling, tremors, sensory change, speech change, focal weakness, seizures, loss of consciousness, weakness and headaches.  Endo/Heme/Allergies: Negative for environmental allergies and polydipsia. Does not bruise/bleed easily.  Psychiatric/Behavioral: Negative for depression, suicidal ideas, hallucinations, memory loss and substance abuse. The patient is not nervous/anxious and does not have insomnia.      PHYSICAL EXAM:  Blood pressure 116/81, temperature 97.8 F (36.6 C), temperature source Oral, resp. rate (!) 37, last menstrual period 03/04/2017, SpO2 96 %.    Vitals  reviewed. Constitutional: She is oriented to person, place, and time. She appears well-developed and well-nourished.  HENT:  Head: Normocephalic and atraumatic.  Right Ear: External ear normal.  Left Ear: External ear normal.  Nose: Nose normal.  Mouth/Throat: Oropharynx is clear and moist.  Eyes: Conjunctivae and EOM are normal. Pupils are equal, round, and reactive to light. Right eye exhibits no discharge. Left eye exhibits no discharge. No scleral icterus.  Neck: Normal range of motion. Neck supple. No tracheal deviation present. No thyromegaly present.  Cardiovascular: Normal rate, regular rhythm, normal heart sounds and intact distal pulses.  Exam reveals no gallop and no friction rub.   No murmur heard. Respiratory: Effort normal and breath sounds normal. No respiratory distress. She has no wheezes. She has no rales. She exhibits no tenderness.  GI: Soft. Bowel sounds are normal. She exhibits no distension and no mass. There is tenderness. There is no rebound and no guarding.  Genitourinary:       Vulva is normal without lesions Vagina is pink moist without discharge Cervix normal in appearance Uterus is normal size, contour, position, consistency, mobility, non-tender Adnexa is negative with normal sized ovaries by sonogram  Musculoskeletal: Normal range of motion. She exhibits no edema and no tenderness.  Neurological: She is alert and oriented to person, place, and time. She has normal reflexes. She displays normal reflexes. No cranial nerve deficit. She exhibits normal muscle tone. Coordination normal.  Skin: Skin is warm and dry. No rash noted. No erythema. No pallor.  Psychiatric: She has a normal mood and affect. Her behavior is normal. Judgment and thought content normal.    Labs: Results for orders placed or performed during the hospital encounter of 04/01/17 (from the past 336 hour(s))  Rapid urine drug screen (hospital performed)   Collection Time: 04/01/17  8:55 AM   Result Value Ref Range   Opiates NONE DETECTED NONE DETECTED   Cocaine NONE DETECTED NONE DETECTED   Benzodiazepines NONE DETECTED NONE DETECTED   Amphetamines NONE DETECTED NONE DETECTED   Tetrahydrocannabinol NONE DETECTED NONE DETECTED   Barbiturates NONE DETECTED NONE DETECTED  Results for orders placed or performed during the hospital encounter of 03/26/17 (from the past 336 hour(s))  Rapid urine drug screen (hospital performed)   Collection Time: 03/26/17 12:50 PM  Result Value Ref Range   Opiates NONE DETECTED NONE DETECTED   Cocaine NONE DETECTED NONE DETECTED   Benzodiazepines NONE DETECTED NONE DETECTED   Amphetamines NONE DETECTED NONE DETECTED   Tetrahydrocannabinol NONE DETECTED NONE DETECTED   Barbiturates NONE DETECTED NONE DETECTED  CBC with Differential/Platelet   Collection Time: 03/26/17 12:50 PM  Result Value Ref Range   WBC 6.0 4.0 - 10.5 K/uL   RBC 4.47 3.87 - 5.11 MIL/uL   Hemoglobin 12.1 12.0 - 15.0 g/dL   HCT 38.7 36.0 - 46.0 %   MCV 86.6 78.0 - 100.0 fL   MCH 27.1 26.0 - 34.0  pg   MCHC 31.3 30.0 - 36.0 g/dL   RDW 15.1 11.5 - 15.5 %   Platelets 235 150 - 400 K/uL   Neutrophils Relative % 54 %   Neutro Abs 3.3 1.7 - 7.7 K/uL   Lymphocytes Relative 34 %   Lymphs Abs 2.0 0.7 - 4.0 K/uL   Monocytes Relative 9 %   Monocytes Absolute 0.5 0.1 - 1.0 K/uL   Eosinophils Relative 2 %   Eosinophils Absolute 0.1 0.0 - 0.7 K/uL   Basophils Relative 1 %   Basophils Absolute 0.0 0.0 - 0.1 K/uL  Comprehensive metabolic panel   Collection Time: 03/26/17 12:50 PM  Result Value Ref Range   Sodium 140 135 - 145 mmol/L   Potassium 3.5 3.5 - 5.1 mmol/L   Chloride 108 101 - 111 mmol/L   CO2 24 22 - 32 mmol/L   Glucose, Bld 101 (H) 65 - 99 mg/dL   BUN 8 6 - 20 mg/dL   Creatinine, Ser 1.14 (H) 0.44 - 1.00 mg/dL   Calcium 8.0 (L) 8.9 - 10.3 mg/dL   Total Protein 5.8 (L) 6.5 - 8.1 g/dL   Albumin 2.8 (L) 3.5 - 5.0 g/dL   AST 14 (L) 15 - 41 U/L   ALT 10 (L) 14 - 54  U/L   Alkaline Phosphatase 55 38 - 126 U/L   Total Bilirubin 0.2 (L) 0.3 - 1.2 mg/dL   GFR calc non Af Amer 56 (L) >60 mL/min   GFR calc Af Amer >60 >60 mL/min   Anion gap 8 5 - 15  Urinalysis, Routine w reflex microscopic   Collection Time: 03/26/17 12:50 PM  Result Value Ref Range   Color, Urine YELLOW YELLOW   APPearance CLOUDY (A) CLEAR   Specific Gravity, Urine 1.013 1.005 - 1.030   pH 9.0 (H) 5.0 - 8.0   Glucose, UA NEGATIVE NEGATIVE mg/dL   Hgb urine dipstick SMALL (A) NEGATIVE   Bilirubin Urine NEGATIVE NEGATIVE   Ketones, ur NEGATIVE NEGATIVE mg/dL   Protein, ur NEGATIVE NEGATIVE mg/dL   Nitrite NEGATIVE NEGATIVE   Leukocytes, UA NEGATIVE NEGATIVE   RBC / HPF NONE SEEN 0 - 5 RBC/hpf   WBC, UA 0-5 0 - 5 WBC/hpf   Bacteria, UA NONE SEEN NONE SEEN   Squamous Epithelial / LPF 0-5 (A) NONE SEEN   Budding Yeast PRESENT   hCG, serum, qualitative   Collection Time: 03/26/17 12:50 PM  Result Value Ref Range   Preg, Serum NEGATIVE NEGATIVE    EKG: Orders placed or performed during the hospital encounter of 10/27/16  . ED EKG within 10 minutes  . ED EKG within 10 minutes  . EKG    Imaging Studies: No results found.    Assessment: Cervical lesion extending into the endocervical canal, inadequate colposcopy  Patient Active Problem List   Diagnosis Date Noted  . Substance abuse ([place]) 05/27/2016  . MDD (major depressive disorder) 06/08/2015  . GAD (generalized anxiety disorder) 02/07/2015  . DDD (degenerative disc disease), lumbar 10/10/2014  . Facet arthropathy, lumbar 10/10/2014  . HGSIL (high grade squamous intraepithelial lesion) on Pap smear of cervix 08/04/2014  . Chronic back pain 07/27/2014  . GERD (gastroesophageal reflux disease) 07/27/2014  . Peripheral neuropathy 06/28/2014  . Obesity 06/28/2014  . Glucose intolerance (impaired glucose tolerance) 04/03/2014  . Chronic anemia 04/01/2014  . CKD (chronic kidney disease) stage 3, GFR 30-59 ml/min ([facility])  04/01/2014    Plan: Laser conization of the cervix  Toula Moos  H Avri Paiva 04/01/2017 10:37 AM

## 2017-04-01 NOTE — Discharge Instructions (Signed)
PATIENT INSTRUCTIONS POST-ANESTHESIA  IMMEDIATELY FOLLOWING SURGERY:  Do not drive or operate machinery for the first twenty four hours after surgery.  Do not make any important decisions for twenty four hours after surgery or while taking narcotic pain medications or sedatives.  If you develop intractable nausea and vomiting or a severe headache please notify your doctor immediately.  FOLLOW-UP:  Please make an appointment with your surgeon as instructed. You do not need to follow up with anesthesia unless specifically instructed to do so.  WOUND CARE INSTRUCTIONS (if applicable):  Keep a dry clean dressing on the anesthesia/puncture wound site if there is drainage.  Once the wound has quit draining you may leave it open to air.  Generally you should leave the bandage intact for twenty four hours unless there is drainage.  If the epidural site drains for more than 36-48 hours please call the anesthesia department.  QUESTIONS?:  Please feel free to call your physician or the hospital operator if you have any questions, and they will be happy to assist you.      Cervical Laser Surgery, Care After This sheet gives you information about how to care for yourself after your procedure. Your health care provider may also give you more specific instructions. If you have problems or questions, contact your health care provider. What can I expect after the procedure? After the procedure, it is common to have:  Pain or discomfort.  Mild cramping.  Bleeding, spotting, or brownish discharge from your vagina.  Follow these instructions at home: Activity  Return to your normal activities as told by your health care provider. Ask your health care provider what activities are safe for you.  Do not lift anything that is heavier than 10 lb (4.5 kg), or the limit that your health care provider tells you, until he or she says that it is safe.  Do not have sex or put anything in your vagina until your  health care provider says it is okay. General instructions  Take over-the-counter and prescription medicines only as told by your health care provider.  Do not drive or use heavy machinery while taking prescription pain medicine.  Wear sanitary pads to protect from bleeding, spotting, and discharge.  Do not use tampons or douche until your health care provider says it is okay.  It is up to you to get the results of your procedure. Ask your health care provider, or the department that is doing the procedure, when your results will be ready.  Keep all follow-up visits as told by your health care provider. This is important. Contact a health care provider if:  Your pain or cramping does not improve.  Your periods are more painful than usual.  You do not get your period as expected. Get help right away if:  You have any symptoms of infection, such as: ? A fever. ? Chills. ? Discharge that smells bad.  You have severe pain in your abdomen.  You have heavy bleeding from your vagina (more than a normal period).  You have vaginal bleeding with clumps of blood (blood clots). Summary  After this procedure, it is common to have pain or discomfort and mild cramping. It is also common to have bleeding, spotting, or brownish discharge from your vagina.  You may need to wear sanitary pads to protect from bleeding, spotting, and discharge.  Do not have sex, use tampons, or douche until your health care provider says it is okay.  Return to your  normal activities as told by your health care provider. Ask your health care provider what activities are safe for you.  Take over-the-counter and prescription medicines only as told by your health care provider. These include medicines for pain. This information is not intended to replace advice given to you by your health care provider. Make sure you discuss any questions you have with your health care provider.

## 2017-04-01 NOTE — Op Note (Signed)
Preoperative diagnosis:  1. Inadequate colpsoscopy                                         2.  Lesion extending into cervical canal  Postoperative Diagnosis:  Same as above  Procedure:  Cervical conization using laser,  ablation of cervical bed using laser  Surgeon:  Jacelyn Grip MD  Anesthesia:  Laryngeal mask airway  Findings:  Very small cervical opening acetowhite changes extending into canal Today at the time of surgery a repeat colposcopy was performed using 3% acetic acid and the lesion was once again confirmed.  There  were no new findings today.  Description of operation:  Patient was taken to the operating room and placed in the supine position where she underwent laryngeal mask airway anesthesia.  She was then placed in the high lithotomy position using candy cane stirrups.  She was then draped out for a laser procedure.  The microscope was used and 3% acetic acid was placed on the cervix.  The holmium laser was then employed at a power of 2.5 and rates between 8 and 15.  I achieved a couple millimeter margin around lesions both at 12:00 and 6:00 the laser was used to perform a conization.  The specimen was removed and sent to pathology for evaluation.  As is always the case with laser I did achieve at an appropriate margin around the disease with shrinkage of the tissue during the procedure it may appear to be a positive lateral margin.  However the surgical margin is indeed clear.  I then used the laser to ablate the conization bed to a depth of 5-7 mm laterally coning down to 9 mm centrally and began getting good surgical margin.  Additional hemostasis was achieved using Monsel solution.  In the conization bed was completely hemostatic.  Blood loss for the procedure was 25cc.  The patient received ancef and toradol preoperatively.  The patient was awakened from anesthesia and taken to the recovery room in good stable condition with all counts being correct.  She will be followed up in  the office in one week for evaluation of the conization bed.  Florian Buff, MD 04/01/2017 12:01 PM

## 2017-04-02 ENCOUNTER — Encounter (HOSPITAL_COMMUNITY): Payer: Self-pay | Admitting: Obstetrics & Gynecology

## 2017-04-02 ENCOUNTER — Ambulatory Visit: Payer: Self-pay | Admitting: Physician Assistant

## 2017-04-02 MED ORDER — FERRIC SUBSULFATE 259 MG/GM EX SOLN
CUTANEOUS | Status: DC | PRN
  Administered 2017-04-01: 1

## 2017-04-02 MED ORDER — FERRIC SUBSULFATE SOLN
Status: DC | PRN
  Administered 2017-04-01: 1

## 2017-04-07 ENCOUNTER — Emergency Department (HOSPITAL_COMMUNITY): Payer: Self-pay

## 2017-04-07 ENCOUNTER — Encounter (HOSPITAL_COMMUNITY): Payer: Self-pay | Admitting: *Deleted

## 2017-04-07 ENCOUNTER — Other Ambulatory Visit: Payer: Self-pay

## 2017-04-07 ENCOUNTER — Emergency Department (HOSPITAL_COMMUNITY)
Admission: EM | Admit: 2017-04-07 | Discharge: 2017-04-07 | Disposition: A | Discharge status: Home / Self Care | Payer: Self-pay | Attending: Emergency Medicine | Admitting: Emergency Medicine

## 2017-04-07 DIAGNOSIS — B9689 Other specified bacterial agents as the cause of diseases classified elsewhere: Secondary | ICD-10-CM | POA: Insufficient documentation

## 2017-04-07 DIAGNOSIS — Z79899 Other long term (current) drug therapy: Secondary | ICD-10-CM | POA: Insufficient documentation

## 2017-04-07 DIAGNOSIS — R1084 Generalized abdominal pain: Secondary | ICD-10-CM

## 2017-04-07 DIAGNOSIS — J45909 Unspecified asthma, uncomplicated: Secondary | ICD-10-CM | POA: Insufficient documentation

## 2017-04-07 DIAGNOSIS — N183 Chronic kidney disease, stage 3 (moderate): Secondary | ICD-10-CM | POA: Insufficient documentation

## 2017-04-07 DIAGNOSIS — A5901 Trichomonal vulvovaginitis: Secondary | ICD-10-CM | POA: Insufficient documentation

## 2017-04-07 DIAGNOSIS — N760 Acute vaginitis: Secondary | ICD-10-CM | POA: Insufficient documentation

## 2017-04-07 DIAGNOSIS — R102 Pelvic and perineal pain: Secondary | ICD-10-CM | POA: Insufficient documentation

## 2017-04-07 HISTORY — DX: Other psychoactive substance abuse, uncomplicated: F19.10

## 2017-04-07 LAB — COMPREHENSIVE METABOLIC PANEL
ALT: 17 U/L (ref 14–54)
ANION GAP: 8 (ref 5–15)
AST: 14 U/L — AB (ref 15–41)
Albumin: 2.8 g/dL — ABNORMAL LOW (ref 3.5–5.0)
Alkaline Phosphatase: 66 U/L (ref 38–126)
BUN: 16 mg/dL (ref 6–20)
CHLORIDE: 104 mmol/L (ref 101–111)
CO2: 24 mmol/L (ref 22–32)
Calcium: 8.3 mg/dL — ABNORMAL LOW (ref 8.9–10.3)
Creatinine, Ser: 1.53 mg/dL — ABNORMAL HIGH (ref 0.44–1.00)
GFR calc Af Amer: 45 mL/min — ABNORMAL LOW (ref >60)
GFR, EST NON AFRICAN AMERICAN: 39 mL/min — AB (ref >60)
Glucose, Bld: 106 mg/dL — ABNORMAL HIGH (ref 65–99)
POTASSIUM: 3.7 mmol/L (ref 3.5–5.1)
Sodium: 136 mmol/L (ref 135–145)
Total Bilirubin: 0.3 mg/dL (ref 0.3–1.2)
Total Protein: 6.1 g/dL — ABNORMAL LOW (ref 6.5–8.1)

## 2017-04-07 LAB — URINALYSIS, ROUTINE W REFLEX MICROSCOPIC
BILIRUBIN URINE: NEGATIVE
Glucose, UA: NEGATIVE mg/dL
Ketones, ur: 5 mg/dL — AB
Nitrite: NEGATIVE
PH: 5 (ref 5.0–8.0)
Protein, ur: 30 mg/dL — AB
SPECIFIC GRAVITY, URINE: 1.027 (ref 1.005–1.030)

## 2017-04-07 LAB — CBC WITH DIFFERENTIAL/PLATELET
BASOS ABS: 0 10*3/uL (ref 0.0–0.1)
Basophils Relative: 0 %
Eosinophils Absolute: 0.1 10*3/uL (ref 0.0–0.7)
Eosinophils Relative: 1 %
HEMATOCRIT: 38.2 % (ref 36.0–46.0)
HEMOGLOBIN: 12.3 g/dL (ref 12.0–15.0)
Lymphocytes Relative: 17 %
Lymphs Abs: 1.5 10*3/uL (ref 0.7–4.0)
MCH: 27.5 pg (ref 26.0–34.0)
MCHC: 32.2 g/dL (ref 30.0–36.0)
MCV: 85.5 fL (ref 78.0–100.0)
MONO ABS: 1 10*3/uL (ref 0.1–1.0)
Monocytes Relative: 11 %
NEUTROS ABS: 6.2 10*3/uL (ref 1.7–7.7)
NEUTROS PCT: 71 %
Platelets: 266 10*3/uL (ref 150–400)
RBC: 4.47 MIL/uL (ref 3.87–5.11)
RDW: 14.5 % (ref 11.5–15.5)
WBC: 8.7 10*3/uL (ref 4.0–10.5)

## 2017-04-07 LAB — WET PREP, GENITAL
SPERM: NONE SEEN
YEAST WET PREP: NONE SEEN

## 2017-04-07 LAB — LIPASE, BLOOD: LIPASE: 30 U/L (ref 11–51)

## 2017-04-07 LAB — PREGNANCY, URINE: Preg Test, Ur: NEGATIVE

## 2017-04-07 MED ORDER — FAMOTIDINE IN NACL 20-0.9 MG/50ML-% IV SOLN
20.0000 mg | Freq: Once | INTRAVENOUS | Status: AC
Start: 2017-04-07 — End: 2017-04-07
  Administered 2017-04-07: 20 mg via INTRAVENOUS
  Filled 2017-04-07: qty 50

## 2017-04-07 MED ORDER — DICYCLOMINE HCL 10 MG/ML IM SOLN
20.0000 mg | Freq: Once | INTRAMUSCULAR | Status: AC
  Administered 2017-04-07: 20 mg via INTRAMUSCULAR
  Filled 2017-04-07: qty 2

## 2017-04-07 MED ORDER — HYDROCODONE-ACETAMINOPHEN 5-325 MG PO TABS: ORAL_TABLET | ORAL | 0 refills | Status: DC

## 2017-04-07 MED ORDER — METRONIDAZOLE 500 MG PO TABS: 500.0000 mg | ORAL_TABLET | Freq: Two times a day (BID) | ORAL | 0 refills | Status: DC

## 2017-04-07 MED ORDER — OXYCODONE-ACETAMINOPHEN 5-325 MG PO TABS
1.0000 | ORAL_TABLET | Freq: Once | ORAL | Status: AC
  Administered 2017-04-07: 1 via ORAL
  Filled 2017-04-07: qty 1

## 2017-04-07 MED ORDER — METRONIDAZOLE 500 MG PO TABS
500.0000 mg | ORAL_TABLET | Freq: Once | ORAL | Status: AC
  Administered 2017-04-07: 500 mg via ORAL
  Filled 2017-04-07: qty 1

## 2017-04-07 NOTE — ED Notes (Signed)
EDP at bedside  

## 2017-04-07 NOTE — Discharge Instructions (Signed)
Take the prescriptions as directed. The vaginal discharge is to be expected after the type of procedure you had done.  Call your regular OB/GYN doctor today to confirm your previously scheduled follow up appointment for Thursday (in 2 days). Return to the Emergency Department immediately sooner if worsening.

## 2017-04-07 NOTE — ED Notes (Signed)
Pt transported to radiology.

## 2017-04-07 NOTE — ED Notes (Signed)
Pt returned from US

## 2017-04-07 NOTE — ED Notes (Signed)
Pt requesting pain medication; EDP notified ?

## 2017-04-07 NOTE — ED Triage Notes (Signed)
Pt c/o vaginal discharge that has a foul odor and is dark brown with clots x 4 days; pt states she had a cervical conization done last thursday

## 2017-04-07 NOTE — ED Notes (Signed)
Pt transported to US

## 2017-04-07 NOTE — ED Provider Notes (Signed)
[facility] EMERGENCY DEPARTMENT Provider Note   CSN: [id number] Arrival date & time: 04/07/17  8841     History   Chief Complaint Chief Complaint  Patient presents with  . Vaginal Discharge    HPI Kristi Martin is a 47 y.o. female.  HPI  Pt was seen at 0710.  Per pt, c/o gradual onset and persistence of constant generalized abd "pain" for the past 2 days.  Has been associated with no other symptoms.  Describes the abd pain as "cramping."  Pt also c/o gradual onset and worsening of persistent "dark brown foul smelling" vaginal discharge and "green urine" for the past 2 to 4 days. Pt states her abd and lower pelvis "is sore" when she urinates, but denies dysuria. Pt s/p cervical conization 5 days ago. Denies N/V, no diarrhea, no fevers, no back pain, no rash, no CP/SOB, no black or blood in stools, no vaginal bleeding, no hematuria.      Past Medical History:  Diagnosis Date  . Anemia   . Anxiety   . Asthma    as child  . Cancer ([place])    cervical cx at age 45  . Cervical cancer ([place])   . Chronic back pain   . Chronic kidney disease   . CKD (chronic kidney disease), symptom management only, stage 3 (moderate) ([place])   . DDD (degenerative disc disease), lumbar    Facet arthritis  . GERD (gastroesophageal reflux disease)   . Hypotension   . Left knee pain   . Migraine   . Peripheral neuropathy   . Sciatica   . Substance abuse ([place])   . Vaginal Pap smear, abnormal     Patient Active Problem List   Diagnosis Date Noted  . Substance abuse ([place]) 05/27/2016  . MDD (major depressive disorder) 06/08/2015  . GAD (generalized anxiety disorder) 02/07/2015  . DDD (degenerative disc disease), lumbar 10/10/2014  . Facet arthropathy, lumbar 10/10/2014  . HGSIL (high grade squamous intraepithelial lesion) on Pap smear of cervix 08/04/2014  . Chronic back pain 07/27/2014  . GERD (gastroesophageal reflux disease) 07/27/2014  . Peripheral neuropathy 06/28/2014  . Obesity  06/28/2014  . Glucose intolerance (impaired glucose tolerance) 04/03/2014  . Chronic anemia 04/01/2014  . CKD (chronic kidney disease) stage 3, GFR 30-59 ml/min ([place]) 04/01/2014    Past Surgical History:  Procedure Laterality Date  . CERVICAL CONIZATION W/BX N/A 04/01/2017   Procedure: LASER CONIZATION CERVIX WITH BIOPSY;  Surgeon: Florian Buff, MD;  Location: [facility] ORS;  Service: Gynecology;  Laterality: N/A;  . CHOLECYSTECTOMY N/A 01/20/2013   Procedure: LAPAROSCOPIC CHOLECYSTECTOMY WITH INTRAOPERATIVE CHOLANGIOGRAM;  Surgeon: Shann Medal, MD;  Location: [facility];  Service: General;  Laterality: N/A;  . TUBAL LIGATION      OB History    Gravida Para Term Preterm AB Living   9 7 5 2 2 7    SAB TAB Ectopic Multiple Live Births   2       1       Home Medications    Prior to Admission medications   Medication Sig Start Date End Date Taking? Authorizing Provider  calcium carbonate (TUMS - DOSED IN MG ELEMENTAL CALCIUM) 500 MG chewable tablet Chew 2 tablets by mouth 3 (three) times daily as needed for indigestion or heartburn.    [provider]  cholecalciferol (VITAMIN D) 1000 units tablet Take 1,000 Units by mouth daily.    [provider]  citalopram (CELEXA) 20 MG tablet Take 30  mg by mouth daily.     [provider]  famotidine (PEPCID) 20 MG tablet Take 1 tablet (20 mg total) by mouth 2 (two) times daily as needed for heartburn or indigestion. 12/03/16   Soyla Dryer, PA-C  ketorolac (TORADOL) 10 MG tablet Take 1 tablet (10 mg total) by mouth every 8 (eight) hours as needed. 04/01/17   Florian Buff, MD  Multiple Vitamin (MULTIVITAMIN WITH MINERALS) TABS tablet Take 1 tablet by mouth daily. [place] A DAY    [provider]  ondansetron (ZOFRAN ODT) 8 MG disintegrating tablet Take 1 tablet (8 mg total) by mouth every 8 (eight) hours as needed for nausea or vomiting. 04/01/17   Florian Buff, MD  traZODone (DESYREL) 100 MG tablet Take 200  mg by mouth at bedtime.    [provider]    Family History Family History  Problem Relation Age of Onset  . Cancer Mother   . Colon cancer Mother   . Breast cancer Maternal Grandmother   . Heart disease Maternal Grandmother   . Cancer Maternal Grandmother   . Breast cancer Paternal Grandmother   . Heart disease Paternal Grandmother   . Hypertension Paternal Grandmother   . Alcohol abuse Father   . Drug abuse Father   . Heart disease Father   . Asthma Brother   . Cancer Paternal Grandfather     Social History Social History   Tobacco Use  . Smoking status: Never Smoker  . Smokeless tobacco: Never Used  Substance Use Topics  . Alcohol use: Yes    Alcohol/week: 0.0 oz    Comment: occasional  . Drug use: Yes    Types: "Crack" cocaine     Allergies   Benadryl [diphenhydramine hcl] and Onion   Review of Systems Review of Systems ROS: Statement: All systems negative except as marked or noted in the HPI; Constitutional: Negative for fever and chills. ; ; Eyes: Negative for eye pain, redness and discharge. ; ; ENMT: Negative for ear pain, hoarseness, nasal congestion, sinus pressure and sore throat. ; ; Cardiovascular: Negative for chest pain, palpitations, diaphoresis, dyspnea and peripheral edema. ; ; Respiratory: Negative for cough, wheezing and stridor. ; ; Gastrointestinal: +abd pain. Negative for nausea, vomiting, diarrhea, blood in stool, hematemesis, jaundice and rectal bleeding. . ; ; Genitourinary: Negative for flank pain and hematuria. ; ; GYN:  +pelvic pain, no vaginal bleeding, +vaginal discharge, no vulvar pain. ;;  Musculoskeletal: Negative for back pain and neck pain. Negative for swelling and trauma.; ; Skin: Negative for pruritus, rash, abrasions, blisters, bruising and skin lesion.; ; Neuro: Negative for headache, lightheadedness and neck stiffness. Negative for weakness, altered level of consciousness, altered mental status, extremity weakness,  paresthesias, involuntary movement, seizure and syncope.      Physical Exam Updated Vital Signs BP 102/85   Pulse 77   Temp 97.8 F (36.6 C) (Oral)   Resp 19   Ht 5\' 5"  (1.651 m)   Wt 117.5 kg (259 lb)   LMP 03/25/2017   SpO2 100%   BMI 43.10 kg/m   Physical Exam 0715: Physical examination:  Nursing notes reviewed; Vital signs and O2 SAT reviewed;  Constitutional: Well developed, Well nourished, Well hydrated, Uncomfortable appearing, moaning and holding her abd.; Head:  Normocephalic, atraumatic; Eyes: EOMI, PERRL, No scleral icterus; ENMT: Mouth and pharynx normal, Mucous membranes moist; Neck: Supple, Full range of motion, No lymphadenopathy; Cardiovascular: Regular rate and rhythm, No gallop; Respiratory: Breath sounds clear &  equal bilaterally, No wheezes.  Speaking full sentences with ease, Normal respiratory effort/excursion; Chest: Nontender, Movement normal; Abdomen: Soft, +RUQ, mid-epigastric, LUQ and suprapubic tenderness to palp. No rebound or guarding. Nondistended, Normal bowel sounds; Genitourinary: No CVA tenderness. Pelvic exam performed with permission of pt and female ED tech assist during exam.  External genitalia w/o lesions. Vaginal vault without discharge.  Cervix w/o lesions, thin charcoal-black drainage from os, no bleeding, GC/chlam and wet prep obtained and sent to lab..;;; Extremities: Pulses normal, No tenderness, No edema, No calf edema or asymmetry.; Neuro: AA&Ox3, Major CN grossly intact.  Speech clear. No gross focal motor or sensory deficits in extremities.; Skin: Color normal, Warm, Dry.    ED Treatments / Results  Labs (all labs ordered are listed, but only abnormal results are displayed)   EKG  EKG Interpretation None       Radiology   Procedures Procedures (including critical care time)  Medications Ordered in ED Medications  metroNIDAZOLE (FLAGYL) tablet 500 mg (not administered)  dicyclomine (BENTYL) injection 20 mg (20 mg  Intramuscular Given 04/07/17 0758)  famotidine (PEPCID) IVPB 20 mg premix (0 mg Intravenous Stopped 04/07/17 0828)  oxyCODONE-acetaminophen (PERCOCET/ROXICET) 5-325 MG per tablet 1 tablet (1 tablet Oral Given 04/07/17 0939)     Initial Impression / Assessment and Plan / ED Course  I have reviewed the triage vital signs and the nursing notes.  Pertinent labs & imaging results that were available during my care of the patient were reviewed by me and considered in my medical decision making (see chart for details).  MDM Reviewed: previous chart, nursing note and vitals Reviewed previous: labs Interpretation: labs, x-ray and CT scan    Results for orders placed or performed during the hospital encounter of 04/07/17  Wet prep, genital  Result Value Ref Range   Yeast Wet Prep HPF POC NONE SEEN NONE SEEN   Trich, Wet Prep PRESENT (A) NONE SEEN   Clue Cells Wet Prep HPF POC PRESENT (A) NONE SEEN   WBC, Wet Prep HPF POC FEW (A) NONE SEEN   Sperm NONE SEEN   Urinalysis, Routine w reflex microscopic  Result Value Ref Range   Color, Urine YELLOW YELLOW   APPearance CLOUDY (A) CLEAR   Specific Gravity, Urine 1.027 1.005 - 1.030   pH 5.0 5.0 - 8.0   Glucose, UA NEGATIVE NEGATIVE mg/dL   Hgb urine dipstick MODERATE (A) NEGATIVE   Bilirubin Urine NEGATIVE NEGATIVE   Ketones, ur 5 (A) NEGATIVE mg/dL   Protein, ur 30 (A) NEGATIVE mg/dL   Nitrite NEGATIVE NEGATIVE   Leukocytes, UA LARGE (A) NEGATIVE   RBC / HPF TOO NUMEROUS TO COUNT 0 - 5 RBC/hpf   WBC, UA TOO NUMEROUS TO COUNT 0 - 5 WBC/hpf   Bacteria, UA RARE (A) NONE SEEN   Squamous Epithelial / LPF 0-5 (A) NONE SEEN   WBC Clumps PRESENT    Mucus PRESENT    Budding Yeast PRESENT   Pregnancy, urine  Result Value Ref Range   Preg Test, Ur NEGATIVE NEGATIVE  Comprehensive metabolic panel  Result Value Ref Range   Sodium 136 135 - 145 mmol/L   Potassium 3.7 3.5 - 5.1 mmol/L   Chloride 104 101 - 111 mmol/L   CO2 24 22 - 32 mmol/L    Glucose, Bld 106 (H) 65 - 99 mg/dL   BUN 16 6 - 20 mg/dL   Creatinine, Ser 1.53 (H) 0.44 - 1.00 mg/dL   Calcium 8.3 (L) 8.9 -  10.3 mg/dL   Total Protein 6.1 (L) 6.5 - 8.1 g/dL   Albumin 2.8 (L) 3.5 - 5.0 g/dL   AST 14 (L) 15 - 41 U/L   ALT 17 14 - 54 U/L   Alkaline Phosphatase 66 38 - 126 U/L   Total Bilirubin 0.3 0.3 - 1.2 mg/dL   GFR calc non Af Amer 39 (L) >60 mL/min   GFR calc Af Amer 45 (L) >60 mL/min   Anion gap 8 5 - 15  Lipase, blood  Result Value Ref Range   Lipase 30 11 - 51 U/L  CBC with Differential  Result Value Ref Range   WBC 8.7 4.0 - 10.5 K/uL   RBC 4.47 3.87 - 5.11 MIL/uL   Hemoglobin 12.3 12.0 - 15.0 g/dL   HCT 38.2 36.0 - 46.0 %   MCV 85.5 78.0 - 100.0 fL   MCH 27.5 26.0 - 34.0 pg   MCHC 32.2 30.0 - 36.0 g/dL   RDW 14.5 11.5 - 15.5 %   Platelets 266 150 - 400 K/uL   Neutrophils Relative % 71 %   Neutro Abs 6.2 1.7 - 7.7 K/uL   Lymphocytes Relative 17 %   Lymphs Abs 1.5 0.7 - 4.0 K/uL   Monocytes Relative 11 %   Monocytes Absolute 1.0 0.1 - 1.0 K/uL   Eosinophils Relative 1 %   Eosinophils Absolute 0.1 0.0 - 0.7 K/uL   Basophils Relative 0 %   Basophils Absolute 0.0 0.0 - 0.1 K/uL   Dg Chest 2 View Result Date: 04/07/2017 CLINICAL DATA:  Upper abdominal pain EXAM: CHEST  2 VIEW COMPARISON:  10/27/2016 FINDINGS: Heart and mediastinal contours are within normal limits. No focal opacities or effusions. No acute bony abnormality. IMPRESSION: No active cardiopulmonary disease. Electronically Signed   By: Rolm Baptise M.D.   On: 04/07/2017 07:50    US Pelvis Complete Result Date: 04/07/2017 CLINICAL DATA:  Abnormal CT scan. Gas within the endometrial canal in the lower uterine segment. EXAM: TRANSABDOMINAL AND TRANSVAGINAL ULTRASOUND OF PELVIS TECHNIQUE: Both transabdominal and transvaginal ultrasound examinations of the pelvis were performed. Transabdominal technique was performed for global imaging of the pelvis including uterus, ovaries, adnexal regions, and  pelvic cul-de-sac. It was necessary to proceed with endovaginal exam following the transabdominal exam to visualize the endometrium. COMPARISON:  CT abdomen and pelvis of the same day. FINDINGS: Uterus Measurements: 8.6 x 6.2 x 6.0 cm. There is a ring down artifact from the lower uterine segment where a known gas collection is present. Endometrium Thickness: 20 mm, thickened. No discrete lesion is evident. There is no increased flow to the endometrium or myometrium. Right ovary Measurements: 1.5 x 3.0 x 1.5 cm, within normal limits. Normal appearance/no adnexal mass. Left ovary Measurements: 2.2 x 2.9 x 1.7 cm, within normal limits. Normal appearance/no adnexal mass. Other findings No significant free fluid is present. IMPRESSION: 1. Ring down artifact consistent with gas previously visualized in the lower uterine segment. 2. No other discrete collection or evidence for endometrial right S is present. 3. Thickening of the endometrium is nonspecific. No discrete lesion is present. Please correlate with menstrual cycle. Sonohysterogram could be used for further evaluation if clinically indicated. 4. Adnexa are within normal limits bilaterally. Electronically Signed   By: San Morelle M.D.   On: 04/07/2017 09:28   Ct Renal Stone Study Result Date: 04/07/2017 CLINICAL DATA:  Recent cervical ablation. Discharge and mid abdominal pain since surgery. Hematuria. EXAM: CT ABDOMEN AND PELVIS WITHOUT CONTRAST  TECHNIQUE: Multidetector CT imaging of the abdomen and pelvis was performed following the standard protocol without IV contrast. COMPARISON:  12/10/2014 FINDINGS: Lower chest: Lung bases are clear. No effusions. Heart is normal size. Hepatobiliary: No focal liver abnormality is seen. Status post cholecystectomy. No biliary dilatation. Pancreas: No focal abnormality or ductal dilatation. Spleen: No focal abnormality.  Normal size. Adrenals/Urinary Tract: No adrenal abnormality. No focal renal abnormality. No  stones or hydronephrosis. Urinary bladder is unremarkable. Stomach/Bowel: Scattered colonic diverticula. Appendix is normal. Stomach and small bowel decompressed, unremarkable. Vascular/Lymphatic: No evidence of aneurysm or adenopathy. Reproductive: Gas noted within the endometrial canal in the lower uterine segment. This is likely related to recent ablation although endometrioid is cannot be completely excluded. No adnexal mass. Other: Trace free fluid in the pelvis.  No free air. Musculoskeletal: No acute bony abnormality. IMPRESSION: Gas noted within the endometrium in the lower uterine segment. This is likely related to recent ablation although endometritis cannot be excluded. Trace free fluid in the pelvis. Scattered colonic diverticula.  No active diverticulitis. No renal or ureteral stones.  No hydronephrosis. Electronically Signed   By: Rolm Baptise M.D.   On: 04/07/2017 08:04    1020:  No clear UTI on Udip; UC pending and pt denies dysuria. CT and [place] reassuring. +trich and BV on wet prep; will rx flagyl.  T/C returned from OB/GYN Dr. Elonda Husky, case discussed, including:  HPI, pertinent PM/SHx, VS/PE, dx testing, ED course and treatment:  Vaginal discharge is expected, agrees tx flagyl x7 days, f/u office visit on 3/7 as previously scheduled. Dx and testing, as well as d/w OB/GYN MD, d/w pt and family.  Questions answered.  Verb understanding, agreeable to d/c home with outpt f/u.    Final Clinical Impressions(s) / ED Diagnoses   Final diagnoses:  None    ED Discharge Orders    None       Francine Graven, DO 04/09/17 2200

## 2017-04-08 LAB — GC/CHLAMYDIA PROBE AMP (~~LOC~~) NOT AT ARMC
Chlamydia: NEGATIVE
Neisseria Gonorrhea: NEGATIVE

## 2017-04-09 ENCOUNTER — Ambulatory Visit (INDEPENDENT_AMBULATORY_CARE_PROVIDER_SITE_OTHER): Payer: Self-pay | Admitting: Obstetrics & Gynecology

## 2017-04-09 ENCOUNTER — Encounter: Payer: Self-pay | Admitting: Obstetrics & Gynecology

## 2017-04-09 VITALS — BP 108/68 | HR 105 | Ht 66.0 in | Wt 257.0 lb

## 2017-04-09 DIAGNOSIS — Z9889 Other specified postprocedural states: Secondary | ICD-10-CM

## 2017-04-09 LAB — URINE CULTURE

## 2017-04-09 NOTE — Progress Notes (Signed)
  HPI: Patient returns for routine postoperative follow-up having undergone laser conization of the cervix 04/01/2017  The patient's immediate postoperative recovery has been unremarkable. Since hospital discharge the patient reports unpleasant vaginal discharge.   Current Outpatient Medications: calcium carbonate (TUMS - DOSED IN MG ELEMENTAL CALCIUM) 500 MG chewable tablet, Chew 2 tablets by mouth 3 (three) times daily as needed for indigestion or heartburn., Disp: , Rfl:  cholecalciferol (VITAMIN D) 1000 units tablet, Take 1,000 Units by mouth daily., Disp: , Rfl:  citalopram (CELEXA) 20 MG tablet, Take 30 mg by mouth daily. , Disp: , Rfl:   HYDROcodone-acetaminophen (NORCO/VICODIN) 5-325 MG tablet, 1 or 2 tabs PO q8 hours prn pain, Disp: 6 tablet, Rfl: 0 metroNIDAZOLE (FLAGYL) 500 MG tablet, Take 1 tablet (500 mg total) by mouth 2 (two) times daily., Disp: 14 tablet, Rfl: 0 Multiple Vitamin (MULTIVITAMIN WITH MINERALS) TABS tablet, Take 1 tablet by mouth daily. WOMEN'S ONE A DAY, Disp: , Rfl:  traZODone (DESYREL) 100 MG tablet, Take 200 mg by mouth at bedtime., Disp: , Rfl:  famotidine (PEPCID) 20 MG tablet, Take 1 tablet (20 mg total) by mouth 2 (two) times daily as needed for heartburn or indigestion. (Patient not taking: Reported on 04/09/2017), Disp: 60 tablet, Rfl: 3 ketorolac (TORADOL) 10 MG tablet, Take 1 tablet (10 mg total) by mouth every 8 (eight) hours as needed. (Patient not taking: Reported on 04/09/2017), Disp: 15 tablet, Rfl: 0 ondansetron (ZOFRAN ODT) 8 MG disintegrating tablet, Take 1 tablet (8 mg total) by mouth every 8 (eight) hours as needed for nausea or vomiting. (Patient not taking: Reported on 04/09/2017), Disp: 20 tablet, Rfl: 0  No current facility-administered medications for this visit.     Blood pressure 108/68, pulse (!) 105, height 5\' 6"  (1.676 m), weight 257 lb (116.6 kg), last menstrual period 03/25/2017.  Physical Exam: Normal post ablation cervix  exam  Diagnostic Tests:   Pathology: HSIL with negative margins  Impression: S/p laser conization of the cervix  Plan: 6 months  Follow up:   Pap every 6 months for 2 years  Florian Buff, MD

## 2017-06-16 ENCOUNTER — Ambulatory Visit: Payer: Self-pay | Admitting: Physician Assistant

## 2017-06-16 ENCOUNTER — Ambulatory Visit
Admission: RE | Admit: 2017-06-16 | Discharge: 2017-06-16 | Disposition: A | Discharge status: Home / Self Care | Payer: Self-pay | Source: Ambulatory Visit | Attending: Physician Assistant | Admitting: Physician Assistant

## 2017-06-16 ENCOUNTER — Encounter: Payer: Self-pay | Admitting: Physician Assistant

## 2017-06-16 ENCOUNTER — Ambulatory Visit (HOSPITAL_COMMUNITY): Payer: MEDICAID

## 2017-06-16 ENCOUNTER — Other Ambulatory Visit (HOSPITAL_COMMUNITY)
Admission: RE | Admit: 2017-06-16 | Discharge: 2017-06-16 | Disposition: A | Discharge status: Home / Self Care | Payer: Self-pay | Source: Ambulatory Visit | Attending: Physician Assistant | Admitting: Physician Assistant

## 2017-06-16 VITALS — BP 97/65 | HR 74 | Temp 97.7°F | Ht 66.0 in | Wt 253.0 lb

## 2017-06-16 DIAGNOSIS — Z87828 Personal history of other (healed) physical injury and trauma: Secondary | ICD-10-CM

## 2017-06-16 DIAGNOSIS — R519 Headache, unspecified: Secondary | ICD-10-CM

## 2017-06-16 DIAGNOSIS — R55 Syncope and collapse: Secondary | ICD-10-CM

## 2017-06-16 DIAGNOSIS — R51 Headache: Secondary | ICD-10-CM | POA: Insufficient documentation

## 2017-06-16 DIAGNOSIS — R42 Dizziness and giddiness: Secondary | ICD-10-CM

## 2017-06-16 DIAGNOSIS — M79605 Pain in left leg: Secondary | ICD-10-CM

## 2017-06-16 DIAGNOSIS — M79641 Pain in right hand: Secondary | ICD-10-CM

## 2017-06-16 LAB — CBC
HCT: 37 % (ref 36.0–46.0)
Hemoglobin: 11.8 g/dL — ABNORMAL LOW (ref 12.0–15.0)
MCH: 27.6 pg (ref 26.0–34.0)
MCHC: 31.9 g/dL (ref 30.0–36.0)
MCV: 86.4 fL (ref 78.0–100.0)
PLATELETS: 268 10*3/uL (ref 150–400)
RBC: 4.28 MIL/uL (ref 3.87–5.11)
RDW: 14 % (ref 11.5–15.5)
WBC: 3.9 10*3/uL — AB (ref 4.0–10.5)

## 2017-06-16 LAB — BASIC METABOLIC PANEL
Anion gap: 5 (ref 5–15)
BUN: 14 mg/dL (ref 6–20)
CALCIUM: 8.6 mg/dL — AB (ref 8.9–10.3)
CO2: 29 mmol/L (ref 22–32)
Chloride: 106 mmol/L (ref 101–111)
Creatinine, Ser: 1.18 mg/dL — ABNORMAL HIGH (ref 0.44–1.00)
GFR calc Af Amer: >60 mL/min (ref >60)
GFR, EST NON AFRICAN AMERICAN: 54 mL/min — AB (ref >60)
GLUCOSE: 95 mg/dL (ref 65–99)
Potassium: 3.9 mmol/L (ref 3.5–5.1)
Sodium: 140 mmol/L (ref 135–145)
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
## 2017-06-16 NOTE — Patient Instructions (Signed)
Financial Counselor- [phone]  

## 2017-06-22 ENCOUNTER — Ambulatory Visit: Payer: Self-pay | Admitting: Physician Assistant

## 2017-06-23 ENCOUNTER — Ambulatory Visit: Payer: Self-pay | Admitting: Physician Assistant

## 2017-06-23 ENCOUNTER — Ambulatory Visit (INDEPENDENT_AMBULATORY_CARE_PROVIDER_SITE_OTHER): Payer: Self-pay | Admitting: Obstetrics & Gynecology

## 2017-06-23 ENCOUNTER — Encounter: Payer: Self-pay | Admitting: Physician Assistant

## 2017-06-23 ENCOUNTER — Encounter: Payer: Self-pay | Admitting: Obstetrics & Gynecology

## 2017-06-23 VITALS — BP 116/70 | HR 82 | Ht 66.0 in | Wt 253.0 lb

## 2017-06-23 VITALS — BP 94/59 | HR 73 | Temp 97.9°F

## 2017-06-23 DIAGNOSIS — R51 Headache: Secondary | ICD-10-CM

## 2017-06-23 DIAGNOSIS — R42 Dizziness and giddiness: Secondary | ICD-10-CM

## 2017-06-23 DIAGNOSIS — R519 Headache, unspecified: Secondary | ICD-10-CM

## 2017-06-23 DIAGNOSIS — M79605 Pain in left leg: Secondary | ICD-10-CM

## 2017-06-23 DIAGNOSIS — M79641 Pain in right hand: Secondary | ICD-10-CM

## 2017-06-23 DIAGNOSIS — N8311 Corpus luteum cyst of right ovary: Secondary | ICD-10-CM

## 2017-06-23 DIAGNOSIS — R55 Syncope and collapse: Secondary | ICD-10-CM

## 2017-06-23 DIAGNOSIS — R930 Abnormal findings on diagnostic imaging of skull and head, not elsewhere classified: Secondary | ICD-10-CM

## 2017-06-23 DIAGNOSIS — Z87828 Personal history of other (healed) physical injury and trauma: Secondary | ICD-10-CM

## 2017-06-23 DIAGNOSIS — N831 Corpus luteum cyst of ovary, unspecified side: Secondary | ICD-10-CM

## 2017-06-23 MED ORDER — METHOCARBAMOL 500 MG PO TABS: 500.0000 mg | ORAL_TABLET | Freq: Three times a day (TID) | ORAL | 1 refills | Status: DC | PRN
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
## 2017-07-08 ENCOUNTER — Other Ambulatory Visit: Payer: Self-pay

## 2017-07-08 ENCOUNTER — Emergency Department (HOSPITAL_COMMUNITY)
Admission: EM | Admit: 2017-07-08 | Discharge: 2017-07-08 | Disposition: A | Discharge status: Home / Self Care | Payer: Self-pay | Attending: Emergency Medicine | Admitting: Emergency Medicine

## 2017-07-08 ENCOUNTER — Encounter (HOSPITAL_COMMUNITY): Payer: Self-pay | Admitting: Emergency Medicine

## 2017-07-08 DIAGNOSIS — N183 Chronic kidney disease, stage 3 (moderate): Secondary | ICD-10-CM | POA: Insufficient documentation

## 2017-07-08 DIAGNOSIS — N764 Abscess of vulva: Secondary | ICD-10-CM | POA: Insufficient documentation

## 2017-07-08 DIAGNOSIS — L0291 Cutaneous abscess, unspecified: Secondary | ICD-10-CM

## 2017-07-08 DIAGNOSIS — Z79899 Other long term (current) drug therapy: Secondary | ICD-10-CM | POA: Insufficient documentation

## 2017-07-08 MED ORDER — POVIDONE-IODINE 10 % EX SOLN
CUTANEOUS | Status: AC
  Filled 2017-07-08: qty 15

## 2017-07-08 MED ORDER — OXYCODONE-ACETAMINOPHEN 5-325 MG PO TABS
1.0000 | ORAL_TABLET | Freq: Once | ORAL | Status: AC
  Administered 2017-07-08: 1 via ORAL
  Filled 2017-07-08: qty 1

## 2017-07-08 MED ORDER — LIDOCAINE HCL (PF) 1 % IJ SOLN
INTRAMUSCULAR | Status: AC
  Filled 2017-07-08: qty 5

## 2017-07-08 MED ORDER — SULFAMETHOXAZOLE-TRIMETHOPRIM 800-160 MG PO TABS: 1.0000 | ORAL_TABLET | Freq: Two times a day (BID) | ORAL | 0 refills | Status: AC

## 2017-07-08 NOTE — Discharge Instructions (Signed)
You have a small superficial abscess. This is a collection of pus.    Treatment includes antibiotics and moist heat therapy.   Take antibiotic as prescribed and until completed. Symptoms typically improve in 48-72 hours.   Apply moist heat (warm towel, heating pad) or massage under warm water at least twice a day to help drainage.   Any abscess can worsen, enlarge and spread infection into blood stream.  Return to the ER if you have fevers, chills, worsening swelling, redness, warmth.   Follow up with your general doctor in 48-72 hours if symptoms are not improving despite antibiotics and heat therapy  

## 2017-07-08 NOTE — ED Provider Notes (Signed)
[facility] EMERGENCY DEPARTMENT Provider Note   CSN: [id number] Arrival date & time: 07/08/17  1231     History   Chief Complaint Chief Complaint  Patient presents with  . Abscess    HPI Kristi Martin is a 47 y.o. female here for evaluation of swelling to the left vaginal area that she noticed this morning.  Reports preceding tenderness for the last 2 days to the same area.  Noticed the pain today while she was wiping.  Pain is sharp, moderate.  Aggravated by direct palpation.  No alleviating factors.  No interventions PTA.  No previous history of abscess.  She denies fevers, chills, dysuria, hematuria, drainage.  HPI  Past Medical History:  Diagnosis Date  . Anemia   . Anxiety   . Asthma    as child  . Cancer ([place])    cervical cx at age 59  . Cervical cancer ([place])   . Chronic back pain   . Chronic kidney disease   . CKD (chronic kidney disease), symptom management only, stage 3 (moderate) ([place])   . DDD (degenerative disc disease), lumbar    Facet arthritis  . GERD (gastroesophageal reflux disease)   . Hypotension   . Left knee pain   . Migraine   . Peripheral neuropathy   . Sciatica   . Substance abuse ([place])   . Vaginal Pap smear, abnormal     Patient Active Problem List   Diagnosis Date Noted  . Substance abuse ([place]) 05/27/2016  . MDD (major depressive disorder) 06/08/2015  . GAD (generalized anxiety disorder) 02/07/2015  . DDD (degenerative disc disease), lumbar 10/10/2014  . Facet arthropathy, lumbar 10/10/2014  . HGSIL (high grade squamous intraepithelial lesion) on Pap smear of cervix 08/04/2014  . Chronic back pain 07/27/2014  . GERD (gastroesophageal reflux disease) 07/27/2014  . Peripheral neuropathy 06/28/2014  . Obesity 06/28/2014  . Glucose intolerance (impaired glucose tolerance) 04/03/2014  . Chronic anemia 04/01/2014  . CKD (chronic kidney disease) stage 3, GFR 30-59 ml/min ([place]) 04/01/2014    Past Surgical History:  Procedure  Laterality Date  . CERVICAL CONIZATION W/BX N/A 04/01/2017   Procedure: LASER CONIZATION CERVIX WITH BIOPSY;  Surgeon: Florian Buff, MD;  Location: [facility];  Service: Gynecology;  Laterality: N/A;  . CHOLECYSTECTOMY N/A 01/20/2013   Procedure: LAPAROSCOPIC CHOLECYSTECTOMY WITH INTRAOPERATIVE CHOLANGIOGRAM;  Surgeon: Shann Medal, MD;  Location: [facility];  Service: General;  Laterality: N/A;  . TUBAL LIGATION       OB History    Gravida  9   Para  7   Term  5   Preterm  2   AB  2   Living  7     SAB  2   TAB      Ectopic      Multiple      Live Births  1            Home Medications    Prior to Admission medications   Medication Sig Start Date End Date Taking? Authorizing Provider  calcium carbonate (TUMS - DOSED IN MG ELEMENTAL CALCIUM) 500 MG chewable tablet Chew 2 tablets by mouth 3 (three) times daily as needed for indigestion or heartburn.   Yes [provider]  cholecalciferol (VITAMIN D) 1000 units tablet Take 1,000 Units by mouth daily.   Yes [provider]  citalopram (CELEXA) 20 MG tablet Take 30 mg by mouth daily.    Yes [provider]  methocarbamol (ROBAXIN)  500 MG tablet Take 1 tablet (500 mg total) by mouth every 8 (eight) hours as needed for muscle spasms. 06/23/17  Yes Soyla Dryer, PA-C  Multiple Vitamin (MULTIVITAMIN WITH MINERALS) TABS tablet Take 1 tablet by mouth daily. WOMEN'S ONE A DAY   Yes [provider]  Probiotic Product (PROBIOTIC PO) Take by mouth.   Yes [provider]  traZODone (DESYREL) 100 MG tablet Take 200 mg by mouth at bedtime.   Yes [provider]  sulfamethoxazole-trimethoprim (BACTRIM DS,SEPTRA DS) 800-160 MG tablet Take 1 tablet by mouth 2 (two) times daily for 7 days. 07/08/17 07/15/17  Kinnie Feil, PA-C    Family History Family History  Problem Relation Age of Onset  . Cancer Mother   . Colon cancer Mother   . Breast cancer Maternal Grandmother   .  Heart disease Maternal Grandmother   . Cancer Maternal Grandmother   . Breast cancer Paternal Grandmother   . Heart disease Paternal Grandmother   . Hypertension Paternal Grandmother   . Alcohol abuse Father   . Drug abuse Father   . Heart disease Father   . Asthma Brother   . Cancer Paternal Grandfather     Social History Social History   Tobacco Use  . Smoking status: Never Smoker  . Smokeless tobacco: Never Used  Substance Use Topics  . Alcohol use: Yes    Alcohol/week: 0.0 oz    Comment: occasional  . Drug use: Yes    Types: "Crack" cocaine     Allergies   Benadryl [diphenhydramine hcl] and Onion   Review of Systems Review of Systems  Skin:       Swelling and tenderness to skin of left vagina  All other systems reviewed and are negative.    Physical Exam Updated Vital Signs BP 110/72 (BP Location: Right Arm)   Pulse 68   Temp 98.2 F (36.8 C) (Oral)   Resp 18   Ht 5\' 6"  (1.676 m)   Wt 117 kg (258 lb)   LMP 07/04/2017 (Exact Date)   SpO2 100%   BMI 41.64 kg/m   Physical Exam  Constitutional: She is oriented to person, place, and time. She appears well-developed and well-nourished. No distress.  NAD.  HENT:  Head: Normocephalic and atraumatic.  Right Ear: External ear normal.  Left Ear: External ear normal.  Nose: Nose normal.  Eyes: Conjunctivae and EOM are normal. No scleral icterus.  Neck: Normal range of motion. Neck supple.  Cardiovascular: Normal rate, regular rhythm and normal heart sounds.  No murmur heard. Pulmonary/Chest: Effort normal and breath sounds normal. She has no wheezes.  Musculoskeletal: Normal range of motion. She exhibits no deformity.  Neurological: She is alert and oriented to person, place, and time.  Skin: Skin is warm and dry. Capillary refill takes less than 2 seconds.  2 x 2 cm area of fluctuance, edema, warmth, tenderness to posterior labial commissure. No vesicular lesions.  Psychiatric: She has a normal mood and  affect. Her behavior is normal. Judgment and thought content normal.  Nursing note and vitals reviewed.    ED Treatments / Results  Labs (all labs ordered are listed, but only abnormal results are displayed) Labs Reviewed - No data to display  EKG None  Radiology No results found.  Procedures Procedures (including critical care time)  Medications Ordered in ED Medications  lidocaine (PF) (XYLOCAINE) 1 % injection (has no administration in time range)  oxyCODONE-acetaminophen (PERCOCET/ROXICET) 5-325 MG per tablet 1 tablet (1  tablet Oral Given 07/08/17 1502)     Initial Impression / Assessment and Plan / ED Course  I have reviewed the triage vital signs and the nursing notes.  Pertinent labs & imaging results that were available during my care of the patient were reviewed by me and considered in my medical decision making (see chart for details).     47 y.o. yo female with abscess to posterior labial commissure with minimal local cellulitis.  No associated fever. I&D performed in ED with scant amount of bloody discharge, however significant improvement in swelling and fluctuance.  Pt is obese and given location of abscess it is higher risk for recollection and worsening infection, will dc with bactrim, warm compresses, NSAIDs and I&D care instructions. Patient aware of symptoms that would warrant return to ED for re-evaluation and treatment. Patient verbalized understanding and is agreeable with plan.   Final Clinical Impressions(s) / ED Diagnoses   Final diagnoses:  Abscess    ED Discharge Orders        Ordered    sulfamethoxazole-trimethoprim (BACTRIM DS,SEPTRA DS) 800-160 MG tablet  2 times daily     07/08/17 1505       Kinnie Feil, PA-C 07/08/17 [place], [place], DO 07/09/17 [phone]

## 2017-07-08 NOTE — ED Triage Notes (Signed)
Pt reports a perineal abscess that she noticed last night. Denies any fever or drainage.

## 2017-07-08 NOTE — ED Notes (Signed)
Abscess noted to the L labial area. No drainage. Patient states the area is tender to touch.

## 2017-07-15 ENCOUNTER — Ambulatory Visit: Payer: Self-pay | Admitting: Physician Assistant

## 2017-07-15 ENCOUNTER — Encounter: Payer: Self-pay | Admitting: Physician Assistant

## 2017-07-15 VITALS — BP 102/73 | HR 87 | Temp 97.7°F | Ht 66.0 in | Wt 251.8 lb

## 2017-07-15 DIAGNOSIS — R42 Dizziness and giddiness: Secondary | ICD-10-CM

## 2017-07-15 DIAGNOSIS — M79641 Pain in right hand: Secondary | ICD-10-CM

## 2017-07-15 DIAGNOSIS — Z87828 Personal history of other (healed) physical injury and trauma: Secondary | ICD-10-CM

## 2017-07-15 DIAGNOSIS — M79605 Pain in left leg: Secondary | ICD-10-CM
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
## 2017-08-06 ENCOUNTER — Emergency Department (HOSPITAL_COMMUNITY): Payer: Self-pay

## 2017-08-06 ENCOUNTER — Emergency Department (HOSPITAL_COMMUNITY)
Admission: EM | Admit: 2017-08-06 | Discharge: 2017-08-06 | Disposition: A | Discharge status: Home / Self Care | Payer: Self-pay | Attending: Emergency Medicine | Admitting: Emergency Medicine

## 2017-08-06 ENCOUNTER — Encounter (HOSPITAL_COMMUNITY): Payer: Self-pay

## 2017-08-06 ENCOUNTER — Other Ambulatory Visit: Payer: Self-pay

## 2017-08-06 DIAGNOSIS — S60221A Contusion of right hand, initial encounter: Secondary | ICD-10-CM | POA: Insufficient documentation

## 2017-08-06 DIAGNOSIS — W2209XA Striking against other stationary object, initial encounter: Secondary | ICD-10-CM | POA: Insufficient documentation

## 2017-08-06 DIAGNOSIS — Z79899 Other long term (current) drug therapy: Secondary | ICD-10-CM | POA: Insufficient documentation

## 2017-08-06 DIAGNOSIS — Y929 Unspecified place or not applicable: Secondary | ICD-10-CM | POA: Insufficient documentation

## 2017-08-06 DIAGNOSIS — N183 Chronic kidney disease, stage 3 (moderate): Secondary | ICD-10-CM | POA: Insufficient documentation

## 2017-08-06 DIAGNOSIS — Y999 Unspecified external cause status: Secondary | ICD-10-CM | POA: Insufficient documentation

## 2017-08-06 DIAGNOSIS — Y9311 Activity, swimming: Secondary | ICD-10-CM | POA: Insufficient documentation

## 2017-08-06 MED ORDER — IBUPROFEN 600 MG PO TABS: 600.0000 mg | ORAL_TABLET | Freq: Four times a day (QID) | ORAL | 0 refills | Status: DC | PRN
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
## 2017-08-06 NOTE — Discharge Instructions (Addendum)
You may elevate and apply ice packs on and off to your hand.  Bulky dressing as needed for support.  You may call Dr. Ruthe Mannan office in 1 week to arrange a follow-up appointment if not improving.
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
## 2017-08-26 ENCOUNTER — Emergency Department (HOSPITAL_COMMUNITY)
Admission: EM | Admit: 2017-08-26 | Discharge: 2017-08-26 | Disposition: A | Discharge status: Home / Self Care | Payer: Self-pay | Attending: Emergency Medicine | Admitting: Emergency Medicine

## 2017-08-26 ENCOUNTER — Other Ambulatory Visit: Payer: Self-pay

## 2017-08-26 ENCOUNTER — Emergency Department (HOSPITAL_COMMUNITY): Payer: Self-pay

## 2017-08-26 ENCOUNTER — Ambulatory Visit: Payer: MEDICAID | Admitting: Neurology

## 2017-08-26 ENCOUNTER — Encounter (HOSPITAL_COMMUNITY): Payer: Self-pay | Admitting: *Deleted

## 2017-08-26 DIAGNOSIS — Z79899 Other long term (current) drug therapy: Secondary | ICD-10-CM | POA: Insufficient documentation

## 2017-08-26 DIAGNOSIS — Z8541 Personal history of malignant neoplasm of cervix uteri: Secondary | ICD-10-CM | POA: Insufficient documentation

## 2017-08-26 DIAGNOSIS — N183 Chronic kidney disease, stage 3 (moderate): Secondary | ICD-10-CM | POA: Insufficient documentation

## 2017-08-26 DIAGNOSIS — J45909 Unspecified asthma, uncomplicated: Secondary | ICD-10-CM | POA: Insufficient documentation

## 2017-08-26 DIAGNOSIS — M25571 Pain in right ankle and joints of right foot: Secondary | ICD-10-CM | POA: Insufficient documentation

## 2017-08-26 DIAGNOSIS — M25572 Pain in left ankle and joints of left foot: Secondary | ICD-10-CM | POA: Insufficient documentation
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
## 2017-08-26 NOTE — Discharge Instructions (Addendum)
Use Tylenol or Motrin for pain.  See the orthopedic doctor for an evaluation, treatment, and ask about referral for physical therapy.
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
## 2017-08-26 NOTE — ED Notes (Signed)
Left ankle pain with swelling noted

## 2017-08-27 ENCOUNTER — Encounter: Payer: Self-pay | Admitting: Neurology

## 2017-09-23 ENCOUNTER — Ambulatory Visit: Payer: Self-pay | Admitting: Physician Assistant

## 2017-09-25 ENCOUNTER — Encounter (HOSPITAL_COMMUNITY): Payer: Self-pay | Admitting: Emergency Medicine

## 2017-09-25 ENCOUNTER — Emergency Department (HOSPITAL_COMMUNITY)
Admission: EM | Admit: 2017-09-25 | Discharge: 2017-09-25 | Disposition: A | Discharge status: Home / Self Care | Payer: Self-pay | Attending: Emergency Medicine | Admitting: Emergency Medicine

## 2017-09-25 ENCOUNTER — Other Ambulatory Visit: Payer: Self-pay

## 2017-09-25 ENCOUNTER — Emergency Department (HOSPITAL_COMMUNITY): Payer: Self-pay

## 2017-09-25 DIAGNOSIS — Z79899 Other long term (current) drug therapy: Secondary | ICD-10-CM | POA: Insufficient documentation

## 2017-09-25 DIAGNOSIS — R42 Dizziness and giddiness: Secondary | ICD-10-CM

## 2017-09-25 DIAGNOSIS — Z8541 Personal history of malignant neoplasm of cervix uteri: Secondary | ICD-10-CM | POA: Insufficient documentation

## 2017-09-25 DIAGNOSIS — S0990XA Unspecified injury of head, initial encounter: Secondary | ICD-10-CM

## 2017-09-25 DIAGNOSIS — Y9389 Activity, other specified: Secondary | ICD-10-CM | POA: Insufficient documentation

## 2017-09-25 DIAGNOSIS — Y92002 Bathroom of unspecified non-institutional (private) residence single-family (private) house as the place of occurrence of the external cause: Secondary | ICD-10-CM | POA: Insufficient documentation

## 2017-09-25 DIAGNOSIS — S060X0A Concussion without loss of consciousness, initial encounter: Secondary | ICD-10-CM | POA: Insufficient documentation

## 2017-09-25 DIAGNOSIS — Y999 Unspecified external cause status: Secondary | ICD-10-CM | POA: Insufficient documentation

## 2017-09-25 DIAGNOSIS — J45909 Unspecified asthma, uncomplicated: Secondary | ICD-10-CM | POA: Insufficient documentation

## 2017-09-25 DIAGNOSIS — N183 Chronic kidney disease, stage 3 (moderate): Secondary | ICD-10-CM | POA: Insufficient documentation

## 2017-09-25 DIAGNOSIS — W1839XA Other fall on same level, initial encounter: Secondary | ICD-10-CM | POA: Insufficient documentation

## 2017-09-25 LAB — BASIC METABOLIC PANEL
ANION GAP: 6 (ref 5–15)
BUN: 9 mg/dL (ref 6–20)
CALCIUM: 8.1 mg/dL — AB (ref 8.9–10.3)
CO2: 25 mmol/L (ref 22–32)
Chloride: 110 mmol/L (ref 98–111)
Creatinine, Ser: 1.09 mg/dL — ABNORMAL HIGH (ref 0.44–1.00)
GFR calc Af Amer: >60 mL/min (ref >60)
GFR calc non Af Amer: 59 mL/min — ABNORMAL LOW (ref >60)
GLUCOSE: 104 mg/dL — AB (ref 70–99)
POTASSIUM: 3.5 mmol/L (ref 3.5–5.1)
Sodium: 141 mmol/L (ref 135–145)

## 2017-09-25 LAB — CBC
HEMATOCRIT: 36.8 % (ref 36.0–46.0)
Hemoglobin: 11.6 g/dL — ABNORMAL LOW (ref 12.0–15.0)
MCH: 26.7 pg (ref 26.0–34.0)
MCHC: 31.5 g/dL (ref 30.0–36.0)
MCV: 84.8 fL (ref 78.0–100.0)
Platelets: 223 10*3/uL (ref 150–400)
RBC: 4.34 MIL/uL (ref 3.87–5.11)
RDW: 14.7 % (ref 11.5–15.5)
WBC: 4.8 10*3/uL (ref 4.0–10.5)

## 2017-09-25 LAB — ETHANOL: Alcohol, Ethyl (B): <10 mg/dL (ref <10)

## 2017-09-25 MED ORDER — IBUPROFEN 400 MG PO TABS: 400.0000 mg | ORAL_TABLET | Freq: Four times a day (QID) | ORAL | 0 refills | Status: DC | PRN

## 2017-09-25 MED ORDER — METOCLOPRAMIDE HCL 5 MG/ML IJ SOLN
10.0000 mg | Freq: Once | INTRAMUSCULAR | Status: AC
  Administered 2017-09-25: 10 mg via INTRAVENOUS
  Filled 2017-09-25: qty 2

## 2017-09-25 MED ORDER — KETOROLAC TROMETHAMINE 30 MG/ML IJ SOLN
30.0000 mg | Freq: Once | INTRAMUSCULAR | Status: AC
  Administered 2017-09-25: 30 mg via INTRAVENOUS
  Filled 2017-09-25: qty 1

## 2017-09-25 NOTE — Discharge Instructions (Addendum)
Your testing shows no signs of swelling or bleeding on the brain, your blood work was overall very unremarkable, there was a slightly low calcium level.  Please take a multivitamin daily, calcium supplement and ibuprofen 400 mg every 6 hours as needed.  Seek medical exam at your doctor's office within 2 days if you are still having headaches or any ongoing symptoms, return to the emergency department for severe or worsening symptoms.
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
## 2017-09-29 ENCOUNTER — Encounter: Payer: Self-pay | Admitting: Physician Assistant

## 2017-09-30 ENCOUNTER — Encounter: Payer: Self-pay | Admitting: Physician Assistant

## 2017-09-30 ENCOUNTER — Ambulatory Visit: Payer: Self-pay | Admitting: Physician Assistant

## 2017-09-30 VITALS — BP 100/72 | HR 81 | Temp 97.7°F | Ht 66.0 in | Wt 257.2 lb

## 2017-09-30 DIAGNOSIS — R42 Dizziness and giddiness: Secondary | ICD-10-CM

## 2017-09-30 DIAGNOSIS — R51 Headache: Secondary | ICD-10-CM

## 2017-09-30 DIAGNOSIS — M79641 Pain in right hand: Secondary | ICD-10-CM

## 2017-09-30 DIAGNOSIS — R519 Headache, unspecified: Secondary | ICD-10-CM

## 2017-09-30 DIAGNOSIS — R55 Syncope and collapse: Secondary | ICD-10-CM

## 2017-09-30 DIAGNOSIS — N189 Chronic kidney disease, unspecified: Secondary | ICD-10-CM

## 2017-09-30 DIAGNOSIS — F419 Anxiety disorder, unspecified: Secondary | ICD-10-CM

## 2017-09-30 NOTE — Progress Notes (Signed)
BP 100/72 (BP Location: Left Arm, Patient Position: Sitting, Cuff Size: Large)   Pulse 81   Temp 97.7 F (36.5 C) (Other (Comment))   Ht 5\' 6"  (1.676 m)   Wt 257 lb 4 oz (116.7 kg)   LMP 09/04/2017   SpO2 98%   BMI 41.52 kg/m    Subjective:    Patient ID: Kristi Martin, female    DOB: 1970-06-23, 47 y.o.   MRN: [id number]  HPI: Kristi Martin is a 47 y.o. female presenting on 09/30/2017 for Follow-up (states went to [facility] ER last Friday due to severe headache, and fainted and hit head)   HPI   Chief Complaint  Patient presents with  . Follow-up    states went to [facility] ER last Friday due to severe headache, and fainted and hit head     Pt needs another referral to neurology - she didn't go to neurology appointment at the time because her [facility] wasn't done  She says she has to turn in 1 more paper to submit and the application will be completed.  She also wants another referral to dr Luna Glasgow for hand pain which is continuing  She is still going to [facility] and is doing well from that aspect.   Notes from recent ER visit reviewed.     Relevant past medical, surgical, family and social history reviewed and updated as indicated. Interim medical history since our last visit reviewed. Allergies and medications reviewed and updated.  Review of Systems  Constitutional: Positive for fatigue. Negative for appetite change, chills, diaphoresis, fever and unexpected weight change.  HENT: Positive for dental problem. Negative for congestion, drooling, ear pain, facial swelling, hearing loss, mouth sores, sneezing, sore throat, trouble swallowing and voice change.   Eyes: Negative for pain, discharge, redness, itching and visual disturbance.  Respiratory: Negative for cough, choking, shortness of breath and wheezing.   Cardiovascular: Negative for chest pain, palpitations and leg swelling.  Gastrointestinal: Negative for abdominal pain, blood in stool, constipation, diarrhea and  vomiting.  Endocrine: Negative for cold intolerance, heat intolerance and polydipsia.  Genitourinary: Negative for decreased urine volume, dysuria and hematuria.  Musculoskeletal: Positive for arthralgias, back pain and gait problem.  Skin: Negative for rash.  Allergic/Immunologic: Negative for environmental allergies.  Neurological: Positive for syncope, light-headedness and headaches. Negative for seizures.  Hematological: Negative for adenopathy.  Psychiatric/Behavioral: Positive for dysphoric mood. Negative for agitation and suicidal ideas. The patient is nervous/anxious.     Per HPI unless specifically indicated above     Objective:    BP 100/72 (BP Location: Left Arm, Patient Position: Sitting, Cuff Size: Large)   Pulse 81   Temp 97.7 F (36.5 C) (Other (Comment))   Ht 5\' 6"  (1.676 m)   Wt 257 lb 4 oz (116.7 kg)   LMP 09/04/2017   SpO2 98%   BMI 41.52 kg/m   Wt Readings from Last 3 Encounters:  09/30/17 257 lb 4 oz (116.7 kg)  09/25/17 268 lb (121.6 kg)  08/26/17 254 lb (115.2 kg)    Physical Exam  Constitutional: She is oriented to person, place, and time. She appears well-developed and well-nourished.  HENT:  Head: Normocephalic and atraumatic.  Neck: Neck supple.  Cardiovascular: Normal rate and regular rhythm.  Pulmonary/Chest: Effort normal and breath sounds normal.  Abdominal: Soft. Bowel sounds are normal. She exhibits no mass. There is no hepatosplenomegaly. There is no tenderness.  Musculoskeletal: She exhibits no edema.  Lymphadenopathy:  She has no cervical adenopathy.  Neurological: She is alert and oriented to person, place, and time.  Skin: Skin is warm and dry.  Psychiatric: She has a normal mood and affect. Her behavior is normal.  Vitals reviewed.   Results for orders placed or performed during the hospital encounter of 09/25/17  CBC  Result Value Ref Range   WBC 4.8 4.0 - 10.5 K/uL   RBC 4.34 3.87 - 5.11 MIL/uL   Hemoglobin 11.6 (L) 12.0  - 15.0 g/dL   HCT 36.8 36.0 - 46.0 %   MCV 84.8 78.0 - 100.0 fL   MCH 26.7 26.0 - 34.0 pg   MCHC 31.5 30.0 - 36.0 g/dL   RDW 14.7 11.5 - 15.5 %   Platelets 223 150 - 400 K/uL  Basic metabolic panel  Result Value Ref Range   Sodium 141 135 - 145 mmol/L   Potassium 3.5 3.5 - 5.1 mmol/L   Chloride 110 98 - 111 mmol/L   CO2 25 22 - 32 mmol/L   Glucose, Bld 104 (H) 70 - 99 mg/dL   BUN 9 6 - 20 mg/dL   Creatinine, Ser 1.09 (H) 0.44 - 1.00 mg/dL   Calcium 8.1 (L) 8.9 - 10.3 mg/dL   GFR calc non Af Amer 59 (L) >60 mL/min   GFR calc Af Amer >60 >60 mL/min   Anion gap 6 5 - 15  Ethanol  Result Value Ref Range   Alcohol, Ethyl (B) <10 <10 mg/dL      Assessment & Plan:   Encounter Diagnoses  Name Primary?  . Dizziness Yes  . Near syncope   . Nonintractable headache, unspecified chronicity pattern, unspecified headache type   . Right hand pain   . Anxiety   . Chronic kidney disease, unspecified CKD stage      -Re-refer -pt counseled to avoid driving in light of syncopal episodes -pt to follow up here 4 months.  RTO sooner prn worsening or new symptoms

## 2017-10-15 ENCOUNTER — Ambulatory Visit (INDEPENDENT_AMBULATORY_CARE_PROVIDER_SITE_OTHER): Payer: MEDICAID | Admitting: Orthopaedic Surgery

## 2017-10-27 ENCOUNTER — Other Ambulatory Visit: Payer: Self-pay | Admitting: Obstetrics & Gynecology

## 2017-11-12 ENCOUNTER — Ambulatory Visit (INDEPENDENT_AMBULATORY_CARE_PROVIDER_SITE_OTHER): Payer: MEDICAID | Admitting: Orthopaedic Surgery

## 2017-11-25 ENCOUNTER — Ambulatory Visit: Payer: MEDICAID | Admitting: Neurology

## 2017-11-28 ENCOUNTER — Emergency Department (HOSPITAL_COMMUNITY)
Admission: EM | Admit: 2017-11-28 | Discharge: 2017-11-28 | Disposition: A | Discharge status: Home / Self Care | Payer: Self-pay | Attending: Emergency Medicine | Admitting: Emergency Medicine

## 2017-11-28 ENCOUNTER — Encounter (HOSPITAL_COMMUNITY): Payer: Self-pay | Admitting: Emergency Medicine

## 2017-11-28 ENCOUNTER — Emergency Department (HOSPITAL_COMMUNITY): Payer: Self-pay

## 2017-11-28 DIAGNOSIS — R0602 Shortness of breath: Secondary | ICD-10-CM | POA: Insufficient documentation

## 2017-11-28 DIAGNOSIS — Z5321 Procedure and treatment not carried out due to patient leaving prior to being seen by health care provider: Secondary | ICD-10-CM | POA: Insufficient documentation

## 2017-11-28 NOTE — ED Triage Notes (Signed)
Pt reports shortness of breath starting last night and increasing through the night.  States it feels like something squeezing her in a tight hug.

## 2017-11-28 NOTE — ED Triage Notes (Signed)
Pt ambulated out of ED in no distress.

## 2017-12-08 ENCOUNTER — Other Ambulatory Visit: Payer: Self-pay | Admitting: Obstetrics & Gynecology

## 2017-12-08 ENCOUNTER — Ambulatory Visit: Payer: MEDICAID | Admitting: Neurology

## 2017-12-10 ENCOUNTER — Ambulatory Visit (INDEPENDENT_AMBULATORY_CARE_PROVIDER_SITE_OTHER): Payer: MEDICAID | Admitting: Orthopaedic Surgery

## 2017-12-15 ENCOUNTER — Ambulatory Visit (INDEPENDENT_AMBULATORY_CARE_PROVIDER_SITE_OTHER): Payer: MEDICAID | Admitting: Orthopaedic Surgery

## 2017-12-18 ENCOUNTER — Encounter (HOSPITAL_COMMUNITY): Payer: Self-pay

## 2017-12-18 ENCOUNTER — Emergency Department (HOSPITAL_COMMUNITY)
Admission: EM | Admit: 2017-12-18 | Discharge: 2017-12-18 | Disposition: A | Discharge status: Home / Self Care | Payer: Self-pay | Attending: Emergency Medicine | Admitting: Emergency Medicine

## 2017-12-18 ENCOUNTER — Other Ambulatory Visit: Payer: Self-pay

## 2017-12-18 ENCOUNTER — Emergency Department (HOSPITAL_COMMUNITY): Payer: Self-pay

## 2017-12-18 DIAGNOSIS — J45909 Unspecified asthma, uncomplicated: Secondary | ICD-10-CM | POA: Insufficient documentation

## 2017-12-18 DIAGNOSIS — Z79899 Other long term (current) drug therapy: Secondary | ICD-10-CM | POA: Insufficient documentation

## 2017-12-18 DIAGNOSIS — M546 Pain in thoracic spine: Secondary | ICD-10-CM | POA: Insufficient documentation

## 2017-12-18 DIAGNOSIS — N183 Chronic kidney disease, stage 3 (moderate): Secondary | ICD-10-CM | POA: Insufficient documentation

## 2017-12-18 DIAGNOSIS — M545 Low back pain, unspecified: Secondary | ICD-10-CM

## 2017-12-18 LAB — BASIC METABOLIC PANEL
ANION GAP: 5 (ref 5–15)
BUN: 10 mg/dL (ref 6–20)
CHLORIDE: 108 mmol/L (ref 98–111)
CO2: 27 mmol/L (ref 22–32)
Calcium: 7.9 mg/dL — ABNORMAL LOW (ref 8.9–10.3)
Creatinine, Ser: 1.11 mg/dL — ABNORMAL HIGH (ref 0.44–1.00)
GFR calc Af Amer: >60 mL/min (ref >60)
GFR, EST NON AFRICAN AMERICAN: 57 mL/min — AB (ref >60)
GLUCOSE: 93 mg/dL (ref 70–99)
POTASSIUM: 3.5 mmol/L (ref 3.5–5.1)
Sodium: 140 mmol/L (ref 135–145)

## 2017-12-18 LAB — CBC
HEMATOCRIT: 34.5 % — AB (ref 36.0–46.0)
HEMOGLOBIN: 10.3 g/dL — AB (ref 12.0–15.0)
MCH: 26.2 pg (ref 26.0–34.0)
MCHC: 29.9 g/dL — ABNORMAL LOW (ref 30.0–36.0)
MCV: 87.8 fL (ref 80.0–100.0)
NRBC: 0 % (ref 0.0–0.2)
Platelets: 273 10*3/uL (ref 150–400)
RBC: 3.93 MIL/uL (ref 3.87–5.11)
RDW: 14.6 % (ref 11.5–15.5)
WBC: 4.7 10*3/uL (ref 4.0–10.5)

## 2017-12-18 LAB — D-DIMER, QUANTITATIVE (NOT AT ARMC)

## 2017-12-18 LAB — TROPONIN I: Troponin I: <0.03 ng/mL (ref <0.03)

## 2017-12-18 MED ORDER — NAPROXEN 500 MG PO TABS: 500.0000 mg | ORAL_TABLET | Freq: Two times a day (BID) | ORAL | 0 refills | Status: DC

## 2017-12-18 MED ORDER — HYDROCODONE-ACETAMINOPHEN 5-325 MG PO TABS
1.0000 | ORAL_TABLET | Freq: Once | ORAL | Status: AC
  Administered 2017-12-18: 1 via ORAL
  Filled 2017-12-18: qty 1

## 2017-12-18 MED ORDER — METHOCARBAMOL 500 MG PO TABS: 500.0000 mg | ORAL_TABLET | Freq: Two times a day (BID) | ORAL | 0 refills | Status: DC

## 2017-12-18 MED ORDER — METHOCARBAMOL 500 MG PO TABS
1000.0000 mg | ORAL_TABLET | Freq: Once | ORAL | Status: AC
  Administered 2017-12-18: 1000 mg via ORAL
  Filled 2017-12-18: qty 2

## 2017-12-18 MED ORDER — KETOROLAC TROMETHAMINE 30 MG/ML IJ SOLN
30.0000 mg | Freq: Once | INTRAMUSCULAR | Status: AC
  Administered 2017-12-18: 30 mg via INTRAVENOUS
  Filled 2017-12-18: qty 1

## 2017-12-18 NOTE — Discharge Instructions (Addendum)
You were seen here today for Back Pain: Low back pain is discomfort in the lower back that may be due to injuries to muscles and ligaments around the spine. Occasionally, it may be caused by a problem to a part of the spine called a disc. Your back pain should be treated with medicines listed below as well as back exercises and this back pain should get better over the next 2 weeks. Most patients get completely well in 4 weeks. It is important to know however, if you develop severe or worsening pain, low back pain with fever, numbness, weakness or inability to walk or urinate, you should return to the ER immediately.  Please follow up with your doctor this week for a recheck if still having symptoms.  HOME INSTRUCTIONS Self - care:  The application of heat can help soothe the pain.  Maintaining your daily activities, including walking (this is encouraged), as it will help you get better faster than just staying in bed. Do not life, push, pull anything more than 10 pounds for the next week. I am attaching back exercises that you can do at home to help facilitate your recovery.   Back Exercises - I have attached a handout on back exercises that can be done at home to help facilitate your recovery.   Medications are also useful to help with pain control.  In addition to the medications listed below you can use over-the-counter salon pas lidocaine patches (blue and silver box) Acetaminophen.  This medication is generally safe, and found over the counter. Take as directed for your age. You should not take more than 8 of the extra strength (500mg ) pills a day (max dose is 4000mg  total OVER one day)  Non steroidal anti inflammatory: This includes medications including Ibuprofen, naproxen and Mobic; These medications help both pain and swelling and are very useful in treating back pain.  They should be taken with food, as they can cause stomach upset, and more seriously, stomach bleeding. Do not combine the  medications.  Muscle relaxants:  These medications can help with muscle tightness that is a cause of lower back pain.  Most of these medications can cause drowsiness, and it is not safe to drive or use dangerous machinery while taking them. They are primarily helpful when taken at night before sleep.  You will need to follow up with your primary healthcare provider in 1-2 weeks for reassessment and persistent symptoms.  Be aware that if you develop new symptoms, such as a fever, leg weakness, difficulty with or loss of control of your urine or bowels, abdominal pain, or more severe pain, you will need to seek medical attention and/or return to the Emergency department. Additional Information:  Your vital signs today were: BP 130/80 (BP Location: Right Wrist)    Pulse 74    Temp 97.8 F (36.6 C) (Oral)    Resp 18    Ht 5\' 6"  (1.676 m)    Wt 117 kg    LMP 12/04/2017    SpO2 98%    BMI 41.64 kg/m  If your blood pressure (BP) was elevated above 135/85 this visit, please have this repeated by your doctor within one month. ---------------

## 2017-12-18 NOTE — ED Triage Notes (Signed)
Pt reports back pain (entire back) since yesterday. No injury noted

## 2017-12-18 NOTE — ED Provider Notes (Signed)
[facility] EMERGENCY DEPARTMENT Provider Note   CSN: [id number] Arrival date & time: 12/18/17  1015     History   Chief Complaint Chief Complaint  Patient presents with  . Back Pain    HPI Kristi Martin is a 47 y.o. female.  Kristi Martin is a 47 y.o. Female with a history of migraines, asthma, GERD, degenerative disc disease and chronic back pain, who presents to the emergency department for evaluation of pain over her entire back.  She reports pain started yesterday she denies any inciting injury or strenuous activity.  Reports history of back pain but this feels different.  Pain is located across the thoracic and lumbar back musculature more so than at midline.  She reports pain is made worse with deep breathing as well as movement.  Pain seems to be radiating to the front of her chest.  She denies any associated coughing, no fevers or chills.  No numbness weakness or tingling in her extremities.  No abdominal pain, nausea or vomiting.  No urinary symptoms.  Patient denies any saddle anesthesia or loss of bowel or bladder control.  No shortness of breath, lightheadedness or syncope.  She has tried Tylenol and ibuprofen without improvement, has not tried anything else to treat her pain.     Past Medical History:  Diagnosis Date  . Anemia   . Anxiety   . Asthma    as child  . Cancer ([place])    cervical cx at age 47  . Cervical cancer ([place])   . Chronic back pain   . Chronic kidney disease   . CKD (chronic kidney disease), symptom management only, stage 3 (moderate) ([place])   . DDD (degenerative disc disease), lumbar    Facet arthritis  . GERD (gastroesophageal reflux disease)   . Hypotension   . Left knee pain   . Migraine   . Peripheral neuropathy   . Sciatica   . Substance abuse ([place])   . Vaginal Pap smear, abnormal     Patient Active Problem List   Diagnosis Date Noted  . Substance abuse ([place]) 05/27/2016  . MDD (major depressive disorder) 06/08/2015  . GAD  (generalized anxiety disorder) 02/07/2015  . DDD (degenerative disc disease), lumbar 10/10/2014  . Facet arthropathy, lumbar 10/10/2014  . HGSIL (high grade squamous intraepithelial lesion) on Pap smear of cervix 08/04/2014  . Chronic back pain 07/27/2014  . GERD (gastroesophageal reflux disease) 07/27/2014  . Peripheral neuropathy 06/28/2014  . Obesity 06/28/2014  . Glucose intolerance (impaired glucose tolerance) 04/03/2014  . Chronic anemia 04/01/2014  . CKD (chronic kidney disease) stage 3, GFR 30-59 ml/min ([place]) 04/01/2014    Past Surgical History:  Procedure Laterality Date  . CERVICAL CONIZATION W/BX N/A 04/01/2017   Procedure: LASER CONIZATION CERVIX WITH BIOPSY;  Surgeon: Florian Buff, MD;  Location: [facility];  Service: Gynecology;  Laterality: N/A;  . CHOLECYSTECTOMY N/A 01/20/2013   Procedure: LAPAROSCOPIC CHOLECYSTECTOMY WITH INTRAOPERATIVE CHOLANGIOGRAM;  Surgeon: Shann Medal, MD;  Location: [facility];  Service: General;  Laterality: N/A;  . TUBAL LIGATION       OB History    Gravida  9   Para  7   Term  5   Preterm  2   AB  2   Living  7     SAB  2   TAB      Ectopic      Multiple      Live Births  1  Home Medications    Prior to Admission medications   Medication Sig Start Date End Date Taking? Authorizing Provider  calcium carbonate (TUMS - DOSED IN MG ELEMENTAL CALCIUM) 500 MG chewable tablet Chew 2 tablets by mouth 3 (three) times daily as needed for indigestion or heartburn.    [provider]  cholecalciferol (VITAMIN D) 1000 units tablet Take 1,000 Units by mouth daily.    [provider]  citalopram (CELEXA) 20 MG tablet Take 30 mg by mouth daily.     [provider]  ibuprofen (ADVIL,MOTRIN) 400 MG tablet Take 1 tablet (400 mg total) by mouth every 6 (six) hours as needed. Patient not taking: Reported on 09/30/2017 09/25/17   Noemi Chapel, MD  methocarbamol (ROBAXIN) 500 MG tablet Take 1 tablet (500  mg total) by mouth 2 (two) times daily. 12/18/17   Jacqlyn Larsen, PA-C  Multiple Vitamin (MULTIVITAMIN WITH MINERALS) TABS tablet Take 1 tablet by mouth daily. WOMEN'S ONE A DAY    [provider]  naproxen (NAPROSYN) 500 MG tablet Take 1 tablet (500 mg total) by mouth 2 (two) times daily. 12/18/17   Jacqlyn Larsen, PA-C  Probiotic Product (PROBIOTIC PO) Take 1 capsule by mouth daily.     [provider]  traZODone (DESYREL) 100 MG tablet Take 200 mg by mouth at bedtime.    [provider]    Family History Family History  Problem Relation Age of Onset  . Cancer Mother   . Colon cancer Mother   . Breast cancer Maternal Grandmother   . Heart disease Maternal Grandmother   . Cancer Maternal Grandmother   . Breast cancer Paternal Grandmother   . Heart disease Paternal Grandmother   . Hypertension Paternal Grandmother   . Alcohol abuse Father   . Drug abuse Father   . Heart disease Father   . Asthma Brother   . Cancer Paternal Grandfather     Social History Social History   Tobacco Use  . Smoking status: Never Smoker  . Smokeless tobacco: Never Used  Substance Use Topics  . Alcohol use: Yes    Alcohol/week: 0.0 standard drinks    Comment: occasional  . Drug use: Not Currently    Types: "Crack" cocaine     Allergies   Benadryl [diphenhydramine hcl] and Onion   Review of Systems Review of Systems  Constitutional: Negative for chills and fever.  HENT: Negative.   Respiratory: Positive for chest tightness. Negative for shortness of breath and wheezing.   Cardiovascular: Negative for chest pain.  Gastrointestinal: Negative for abdominal pain, blood in stool, diarrhea, nausea and vomiting.  Genitourinary: Negative for dysuria, flank pain and frequency.  Musculoskeletal: Positive for back pain. Negative for arthralgias, myalgias and neck pain.  Skin: Negative for color change and rash.  Neurological: Negative for weakness and numbness.      Physical Exam Updated Vital Signs BP 130/80 (BP Location: Right Wrist)   Pulse 74   Temp 97.8 F (36.6 C) (Oral)   Resp 18   Ht 5\' 6"  (1.676 m)   Wt 117 kg   LMP 12/04/2017   SpO2 98%   BMI 41.64 kg/m   Physical Exam  Constitutional: She appears well-developed and well-nourished. No distress.  HENT:  Head: Normocephalic and atraumatic.  Mouth/Throat: Oropharynx is clear and moist.  Eyes: Right eye exhibits no discharge. Left eye exhibits no discharge.  Neck: Neck supple.  C-spine nontender to palpation  Cardiovascular: Normal rate, regular rhythm, normal heart  sounds and intact distal pulses. Exam reveals no gallop and no friction rub.  No murmur heard. Pulmonary/Chest: Effort normal and breath sounds normal. No stridor. No respiratory distress. She has no wheezes. She has no rales.  Respirations equal and unlabored, patient able to speak in full sentences, lungs clear to auscultation bilaterally, anterior chest wall tender to palpation without overlying skin changes or palpable deformity.  Abdominal: Soft. Bowel sounds are normal. She exhibits no distension and no mass. There is no tenderness. There is no guarding.  Abdomen soft, nondistended, nontender to palpation in all quadrants without guarding or peritoneal signs  Musculoskeletal:  Thoracic and lumbar spine diffusely tender to palpation bilaterally, no focal midline tenderness, no overlying skin changes or palpable deformity.  Neurological: She is alert. Coordination normal.  Speech is clear, able to follow commands CN III-XII intact Normal strength in upper and lower extremities bilaterally including dorsiflexion and plantar flexion, strong and equal grip strength Sensation normal to light and sharp touch Moves extremities without ataxia, coordination intact  Skin: Skin is warm and dry. Capillary refill takes less than 2 seconds. She is not diaphoretic.  Psychiatric: She has a normal mood and affect. Her behavior  is normal.  Nursing note and vitals reviewed.    ED Treatments / Results  Labs (all labs ordered are listed, but only abnormal results are displayed) Labs Reviewed  BASIC METABOLIC PANEL - Abnormal; Notable for the following components:      Result Value   Creatinine, Ser 1.11 (*)    Calcium 7.9 (*)    GFR calc non Af Amer 57 (*)    All other components within normal limits  CBC - Abnormal; Notable for the following components:   Hemoglobin 10.3 (*)    HCT 34.5 (*)    MCHC 29.9 (*)    All other components within normal limits  D-DIMER, QUANTITATIVE (NOT AT [facility])  TROPONIN I    EKG EKG Interpretation  Date/Time:  Friday December 18 2017 12:08:31 EST Ventricular Rate:  72 PR Interval:    QRS Duration: 82 QT Interval:  431 QTC Calculation: 472 R Axis:   58 Text Interpretation:  Sinus rhythm ST elev, probable normal early repol pattern Baseline wander When compared with ECG of 11/28/2017 No significant change was found Confirmed by Francine Graven [phone]) on 12/18/2017 12:23:36 PM   Radiology Dg Chest 2 View  Result Date: 12/18/2017 CLINICAL DATA:  47 year old female with shortness of breath and back pain since last night. EXAM: CHEST - 2 VIEW COMPARISON:  11/28/2017 and earlier. FINDINGS: The heart size and mediastinal contours are within normal limits. Both lungs are clear. No pneumothorax or pleural effusion. No acute osseous abnormality identified. Stable cholecystectomy clips. Negative visible bowel gas pattern. IMPRESSION: Negative.  No cardiopulmonary abnormality. Electronically Signed   By: Genevie Ann M.D.   On: 12/18/2017 12:16    Procedures Procedures (including critical care time)  Medications Ordered in ED Medications  ketorolac (TORADOL) 30 MG/ML injection 30 mg (30 mg Intravenous Given 12/18/17 1300)  methocarbamol (ROBAXIN) tablet 1,000 mg (1,000 mg Oral Given 12/18/17 1300)  HYDROcodone-acetaminophen (NORCO/VICODIN) 5-325 MG per tablet 1 tablet (1 tablet  Oral Given 12/18/17 1300)     Initial Impression / Assessment and Plan / ED Course  I have reviewed the triage vital signs and the nursing notes.  Pertinent labs & imaging results that were available during my care of the patient were reviewed by me and considered in my medical decision making (see  chart for details).  She presents for evaluation of diffuse thoracic and lumbar back pain with no inciting injury or strenuous activity, history of chronic back pain but reports this feels different.  No numbness tingling or weakness, no saddle anesthesia or loss of bowel or bladder control, no concern for cauda equina.  Pain is worse with deep breathing, and radiating to the front of the chest, this raises some concern for PE especially given no inciting event associated with patient's pain.  Although she does not have history of PE or DVT or many risk factors.  Suspect musculoskeletal back pain, but will get basic labs, d-dimer, troponin, chest x-ray and EKG given pleuritic nature of pain and severity since onset.  Toradol, Robaxin and 1 dose of Norco given here for symptomatic management.  Work-up is very reassuring, EKG without concerning ischemic changes and troponin negative.  No leukocytosis, stable hemoglobin, no acute electrolyte derangements requiring intervention.  Chest x-ray shows no active cardiopulmonary disease and d-dimer is negative.  Back pain has improved with symptomatic management here in the emergency department, suspect this is musculoskeletal, will treat with muscle relaxers and NSAIDs and have patient follow-up with her primary care doctor.  She remains neurologically intact and is ambulatory here in the department.  Return precautions discussed.  Patient expresses understanding and is in agreement with plan.   Final Clinical Impressions(s) / ED Diagnoses   Final diagnoses:  Acute bilateral thoracic back pain  Acute bilateral low back pain without sciatica    ED Discharge  Orders         Ordered    naproxen (NAPROSYN) 500 MG tablet  2 times daily     12/18/17 1306    methocarbamol (ROBAXIN) 500 MG tablet  2 times daily     12/18/17 1306           Jacqlyn Larsen, [place] 12/18/17 1628    Francine Graven, DO 12/21/17 2215

## 2017-12-22 ENCOUNTER — Ambulatory Visit (INDEPENDENT_AMBULATORY_CARE_PROVIDER_SITE_OTHER): Payer: MEDICAID | Admitting: Orthopaedic Surgery

## 2017-12-25 ENCOUNTER — Ambulatory Visit (INDEPENDENT_AMBULATORY_CARE_PROVIDER_SITE_OTHER): Payer: MEDICAID | Admitting: Orthopaedic Surgery

## 2017-12-29 ENCOUNTER — Ambulatory Visit (INDEPENDENT_AMBULATORY_CARE_PROVIDER_SITE_OTHER): Payer: MEDICAID | Admitting: Orthopaedic Surgery

## 2018-01-06 ENCOUNTER — Ambulatory Visit (INDEPENDENT_AMBULATORY_CARE_PROVIDER_SITE_OTHER): Payer: MEDICAID | Admitting: Orthopaedic Surgery

## 2018-01-18 ENCOUNTER — Other Ambulatory Visit: Payer: Self-pay | Admitting: Obstetrics & Gynecology

## 2018-02-08 ENCOUNTER — Ambulatory Visit: Payer: Self-pay | Admitting: Physician Assistant

## 2018-02-10 ENCOUNTER — Ambulatory Visit: Payer: MEDICAID | Admitting: Neurology

## 2018-02-15 ENCOUNTER — Encounter (HOSPITAL_COMMUNITY): Payer: Self-pay | Admitting: Emergency Medicine

## 2018-02-15 ENCOUNTER — Emergency Department (HOSPITAL_COMMUNITY): Payer: Self-pay

## 2018-02-15 ENCOUNTER — Emergency Department (HOSPITAL_COMMUNITY)
Admission: EM | Admit: 2018-02-15 | Discharge: 2018-02-15 | Disposition: A | Discharge status: Home / Self Care | Payer: Self-pay | Attending: Emergency Medicine | Admitting: Emergency Medicine

## 2018-02-15 DIAGNOSIS — Z79899 Other long term (current) drug therapy: Secondary | ICD-10-CM | POA: Insufficient documentation

## 2018-02-15 DIAGNOSIS — N183 Chronic kidney disease, stage 3 (moderate): Secondary | ICD-10-CM | POA: Insufficient documentation

## 2018-02-15 DIAGNOSIS — N3000 Acute cystitis without hematuria: Secondary | ICD-10-CM | POA: Insufficient documentation

## 2018-02-15 DIAGNOSIS — J45909 Unspecified asthma, uncomplicated: Secondary | ICD-10-CM | POA: Insufficient documentation

## 2018-02-15 LAB — CBC WITH DIFFERENTIAL/PLATELET
Abs Immature Granulocytes: 0.02 10*3/uL (ref 0.00–0.07)
Basophils Absolute: 0 10*3/uL (ref 0.0–0.1)
Basophils Relative: 1 %
Eosinophils Absolute: 0.1 10*3/uL (ref 0.0–0.5)
Eosinophils Relative: 2 %
HCT: 37.9 % (ref 36.0–46.0)
Hemoglobin: 11.5 g/dL — ABNORMAL LOW (ref 12.0–15.0)
Immature Granulocytes: 0 %
Lymphocytes Relative: 30 %
Lymphs Abs: 1.4 10*3/uL (ref 0.7–4.0)
MCH: 26.8 pg (ref 26.0–34.0)
MCHC: 30.3 g/dL (ref 30.0–36.0)
MCV: 88.3 fL (ref 80.0–100.0)
Monocytes Absolute: 0.5 10*3/uL (ref 0.1–1.0)
Monocytes Relative: 12 %
Neutro Abs: 2.5 10*3/uL (ref 1.7–7.7)
Neutrophils Relative %: 55 %
Platelets: 265 10*3/uL (ref 150–400)
RBC: 4.29 MIL/uL (ref 3.87–5.11)
RDW: 14.9 % (ref 11.5–15.5)
WBC: 4.5 10*3/uL (ref 4.0–10.5)
nRBC: 0 % (ref 0.0–0.2)

## 2018-02-15 LAB — URINALYSIS, ROUTINE W REFLEX MICROSCOPIC
Bilirubin Urine: NEGATIVE
Glucose, UA: NEGATIVE mg/dL
Hgb urine dipstick: NEGATIVE
Ketones, ur: NEGATIVE mg/dL
NITRITE: POSITIVE — AB
Protein, ur: NEGATIVE mg/dL
Specific Gravity, Urine: 1.024 (ref 1.005–1.030)
pH: 7 (ref 5.0–8.0)

## 2018-02-15 LAB — COMPREHENSIVE METABOLIC PANEL
ALT: 10 U/L (ref 0–44)
AST: 12 U/L — ABNORMAL LOW (ref 15–41)
Albumin: 2.8 g/dL — ABNORMAL LOW (ref 3.5–5.0)
Alkaline Phosphatase: 48 U/L (ref 38–126)
Anion gap: 4 — ABNORMAL LOW (ref 5–15)
BUN: 14 mg/dL (ref 6–20)
CALCIUM: 8.4 mg/dL — AB (ref 8.9–10.3)
CO2: 26 mmol/L (ref 22–32)
Chloride: 109 mmol/L (ref 98–111)
Creatinine, Ser: 1.17 mg/dL — ABNORMAL HIGH (ref 0.44–1.00)
GFR calc non Af Amer: 55 mL/min — ABNORMAL LOW (ref >60)
Glucose, Bld: 96 mg/dL (ref 70–99)
Potassium: 4 mmol/L (ref 3.5–5.1)
Sodium: 139 mmol/L (ref 135–145)
Total Bilirubin: 0.2 mg/dL — ABNORMAL LOW (ref 0.3–1.2)
Total Protein: 5.7 g/dL — ABNORMAL LOW (ref 6.5–8.1)

## 2018-02-15 LAB — I-STAT BETA HCG BLOOD, ED (MC, WL, AP ONLY): I-stat hCG, quantitative: <5 m[IU]/mL (ref <5)

## 2018-02-15 LAB — D-DIMER, QUANTITATIVE: D-Dimer, Quant: 0.39 ug/mL-FEU (ref 0.00–0.50)

## 2018-02-15 MED ORDER — CEPHALEXIN 500 MG PO CAPS
500.0000 mg | ORAL_CAPSULE | Freq: Once | ORAL | Status: AC
  Administered 2018-02-15: 500 mg via ORAL
  Filled 2018-02-15: qty 1

## 2018-02-15 MED ORDER — ONDANSETRON HCL 4 MG/2ML IJ SOLN
4.0000 mg | Freq: Once | INTRAMUSCULAR | Status: AC
  Administered 2018-02-15: 4 mg via INTRAVENOUS
  Filled 2018-02-15: qty 2

## 2018-02-15 MED ORDER — HYDROCODONE-ACETAMINOPHEN 5-325 MG PO TABS: 1.0000 | ORAL_TABLET | Freq: Four times a day (QID) | ORAL | 0 refills | Status: DC | PRN

## 2018-02-15 MED ORDER — CEPHALEXIN 500 MG PO CAPS: 500.0000 mg | ORAL_CAPSULE | Freq: Four times a day (QID) | ORAL | 0 refills | Status: DC

## 2018-02-15 MED ORDER — HYDROMORPHONE HCL 1 MG/ML IJ SOLN
1.0000 mg | Freq: Once | INTRAMUSCULAR | Status: AC
  Administered 2018-02-15: 1 mg via INTRAVENOUS
  Filled 2018-02-15: qty 1

## 2018-02-15 NOTE — ED Provider Notes (Signed)
[facility] EMERGENCY DEPARTMENT Provider Note   CSN: [id number] Arrival date & time: 02/15/18  0901     History   Chief Complaint Chief Complaint  Patient presents with  . Back Pain    HPI Kristi Martin is a 47 y.o. female.  Patient complains of left flank pain.  The history is provided by the patient. No language interpreter was used.  Back Pain  Location:  Generalized Quality:  Aching Radiates to:  Does not radiate (Lower left leg) Pain severity:  Moderate Pain is:  Same all the time Onset quality:  Gradual Timing:  Constant Progression:  Waxing and waning Chronicity:  New Context: not emotional stress   Relieved by:  Nothing Worsened by:  Nothing Ineffective treatments:  None tried Associated symptoms: no abdominal pain, no chest pain and no headaches     Past Medical History:  Diagnosis Date  . Anemia   . Anxiety   . Asthma    as child  . Cancer ([place])    cervical cx at age 31  . Cervical cancer ([place])   . Chronic back pain   . Chronic kidney disease   . CKD (chronic kidney disease), symptom management only, stage 3 (moderate) ([place])   . DDD (degenerative disc disease), lumbar    Facet arthritis  . GERD (gastroesophageal reflux disease)   . Hypotension   . Left knee pain   . Migraine   . Peripheral neuropathy   . Sciatica   . Substance abuse ([place])   . Vaginal Pap smear, abnormal     Patient Active Problem List   Diagnosis Date Noted  . Substance abuse ([place]) 05/27/2016  . MDD (major depressive disorder) 06/08/2015  . GAD (generalized anxiety disorder) 02/07/2015  . DDD (degenerative disc disease), lumbar 10/10/2014  . Facet arthropathy, lumbar 10/10/2014  . HGSIL (high grade squamous intraepithelial lesion) on Pap smear of cervix 08/04/2014  . Chronic back pain 07/27/2014  . GERD (gastroesophageal reflux disease) 07/27/2014  . Peripheral neuropathy 06/28/2014  . Obesity 06/28/2014  . Glucose intolerance (impaired glucose tolerance)  04/03/2014  . Chronic anemia 04/01/2014  . CKD (chronic kidney disease) stage 3, GFR 30-59 ml/min ([place]) 04/01/2014    Past Surgical History:  Procedure Laterality Date  . CERVICAL CONIZATION W/BX N/A 04/01/2017   Procedure: LASER CONIZATION CERVIX WITH BIOPSY;  Surgeon: Florian Buff, MD;  Location: [facility];  Service: Gynecology;  Laterality: N/A;  . CHOLECYSTECTOMY N/A 01/20/2013   Procedure: LAPAROSCOPIC CHOLECYSTECTOMY WITH INTRAOPERATIVE CHOLANGIOGRAM;  Surgeon: Shann Medal, MD;  Location: [facility];  Service: General;  Laterality: N/A;  . TUBAL LIGATION       OB History    Gravida  9   Para  7   Term  5   Preterm  2   AB  2   Living  7     SAB  2   TAB      Ectopic      Multiple      Live Births  1            Home Medications    Prior to Admission medications   Medication Sig Start Date End Date Taking? Authorizing Provider  calcium carbonate (TUMS - DOSED IN MG ELEMENTAL CALCIUM) 500 MG chewable tablet Chew 2 tablets by mouth 3 (three) times daily as needed for indigestion or heartburn.   Yes [provider]  cholecalciferol (VITAMIN D) 1000 units tablet Take 1,000 Units by mouth daily.  Yes [provider]  citalopram (CELEXA) 20 MG tablet Take 30 mg by mouth daily.    Yes [provider]  methocarbamol (ROBAXIN) 500 MG tablet Take 1 tablet (500 mg total) by mouth 2 (two) times daily. 12/18/17  Yes Jacqlyn Larsen, PA-C  Multiple Vitamin (MULTIVITAMIN WITH MINERALS) TABS tablet Take 1 tablet by mouth daily. WOMEN'S ONE A DAY   Yes [provider]  naproxen (NAPROSYN) 500 MG tablet Take 1 tablet (500 mg total) by mouth 2 (two) times daily. 12/18/17  Yes Jacqlyn Larsen, PA-C  Probiotic Product (PROBIOTIC PO) Take 1 capsule by mouth daily.    Yes [provider]  traZODone (DESYREL) 100 MG tablet Take 300 mg by mouth at bedtime.    Yes [provider]  cephALEXin (KEFLEX) 500 MG capsule Take 1 capsule  (500 mg total) by mouth 4 (four) times daily. 02/15/18   Milton Ferguson, MD  HYDROcodone-acetaminophen (NORCO/VICODIN) 5-325 MG tablet Take 1 tablet by mouth every 6 (six) hours as needed for moderate pain. 02/15/18   Milton Ferguson, MD  ibuprofen (ADVIL,MOTRIN) 400 MG tablet Take 1 tablet (400 mg total) by mouth every 6 (six) hours as needed. Patient not taking: Reported on 09/30/2017 09/25/17   Noemi Chapel, MD    Family History Family History  Problem Relation Age of Onset  . Cancer Mother   . Colon cancer Mother   . Breast cancer Maternal Grandmother   . Heart disease Maternal Grandmother   . Cancer Maternal Grandmother   . Breast cancer Paternal Grandmother   . Heart disease Paternal Grandmother   . Hypertension Paternal Grandmother   . Alcohol abuse Father   . Drug abuse Father   . Heart disease Father   . Asthma Brother   . Cancer Paternal Grandfather     Social History Social History   Tobacco Use  . Smoking status: Never Smoker  . Smokeless tobacco: Never Used  Substance Use Topics  . Alcohol use: Yes    Alcohol/week: 0.0 standard drinks    Comment: occasional  . Drug use: Not Currently    Types: "Crack" cocaine     Allergies   Benadryl [diphenhydramine hcl] and Onion   Review of Systems Review of Systems  Constitutional: Negative for appetite change and fatigue.  HENT: Negative for congestion, ear discharge and sinus pressure.   Eyes: Negative for discharge.  Respiratory: Negative for cough.   Cardiovascular: Negative for chest pain.  Gastrointestinal: Negative for abdominal pain and diarrhea.  Genitourinary: Negative for frequency and hematuria.  Musculoskeletal: Positive for back pain.  Skin: Negative for rash.  Neurological: Negative for seizures and headaches.  Psychiatric/Behavioral: Negative for hallucinations.  All other systems reviewed and are negative.    Physical Exam Updated Vital Signs BP 115/74   Pulse 73   Temp 98 F (36.7 C)  (Oral)   Resp 18   Ht 5\' 6"  (1.676 m)   Wt 114.3 kg   LMP 01/25/2018   SpO2 98%   BMI 40.67 kg/m   Physical Exam Vitals signs reviewed.  Constitutional:      Appearance: She is well-developed.  HENT:     Head: Normocephalic.     Nose: Nose normal.  Eyes:     General: No scleral icterus.    Conjunctiva/sclera: Conjunctivae normal.  Neck:     Musculoskeletal: Neck supple.     Thyroid: No thyromegaly.  Cardiovascular:     Rate and Rhythm: Normal rate and regular rhythm.  Heart sounds: No murmur. No friction rub. No gallop.   Pulmonary:     Breath sounds: No stridor. No wheezing or rales.  Chest:     Chest wall: No tenderness.  Abdominal:     General: There is no distension.     Tenderness: There is no abdominal tenderness. There is no rebound.     Comments: Tender left flank  Musculoskeletal: Normal range of motion.  Lymphadenopathy:     Cervical: No cervical adenopathy.  Skin:    Findings: No erythema or rash.  Neurological:     Mental Status: She is alert and oriented to person, place, and time.     Motor: No abnormal muscle tone.     Coordination: Coordination normal.  Psychiatric:        Behavior: Behavior normal.      ED Treatments / Results  Labs (all labs ordered are listed, but only abnormal results are displayed) Labs Reviewed  CBC WITH DIFFERENTIAL/PLATELET - Abnormal; Notable for the following components:      Result Value   Hemoglobin 11.5 (*)    All other components within normal limits  COMPREHENSIVE METABOLIC PANEL - Abnormal; Notable for the following components:   Creatinine, Ser 1.17 (*)    Calcium 8.4 (*)    Total Protein 5.7 (*)    Albumin 2.8 (*)    AST 12 (*)    Total Bilirubin 0.2 (*)    GFR calc non Af Amer 55 (*)    Anion gap 4 (*)    All other components within normal limits  URINALYSIS, ROUTINE W REFLEX MICROSCOPIC - Abnormal; Notable for the following components:   APPearance HAZY (*)    Nitrite POSITIVE (*)     Leukocytes, UA SMALL (*)    Bacteria, UA FEW (*)    All other components within normal limits  URINE CULTURE  D-DIMER, QUANTITATIVE (NOT AT [facility])  I-STAT BETA HCG BLOOD, ED ([facility], [facility], [facility] ONLY)    EKG None  Radiology Dg Chest 2 View  Result Date: 02/15/2018 CLINICAL DATA:  Chest pain EXAM: CHEST - 2 VIEW COMPARISON:  12/18/2017, 04/07/2017 FINDINGS: The heart size and mediastinal contours are within normal limits. Both lungs are clear. The visualized skeletal structures are unremarkable. IMPRESSION: No active cardiopulmonary disease. Electronically Signed   By: Kathreen Devoid   On: 02/15/2018 09:53    Procedures Procedures (including critical care time)  Medications Ordered in ED Medications  cephALEXin (KEFLEX) capsule 500 mg (has no administration in time range)  HYDROmorphone (DILAUDID) injection 1 mg (1 mg Intravenous Given 02/15/18 0938)  ondansetron (ZOFRAN) injection 4 mg (4 mg Intravenous Given 02/15/18 9509)     Initial Impression / Assessment and Plan / ED Course  I have reviewed the triage vital signs and the nursing notes.  Pertinent labs & imaging results that were available during my care of the patient were reviewed by me and considered in my medical decision making (see chart for details).     Labs and x-rays unremarkable.  Urinalysis shows possible urinary tract infection.  She will be placed on Keflex and her urine will be cultured she will be followed up on Thursday with her primary care doctor  Final Clinical Impressions(s) / ED Diagnoses   Final diagnoses:  Acute cystitis without hematuria    ED Discharge Orders         Ordered    cephALEXin (KEFLEX) 500 MG capsule  4 times daily     02/15/18 1144  HYDROcodone-acetaminophen (NORCO/VICODIN) 5-325 MG tablet  Every 6 hours PRN     02/15/18 1144           Milton Ferguson, MD 02/15/18 1150

## 2018-02-15 NOTE — ED Triage Notes (Signed)
Pt states she has been having back and neck pain x 2 days with no injury.  Taken Naproxen with no relief. Hx of same.

## 2018-02-15 NOTE — Discharge Instructions (Addendum)
Follow-up Thursday as planned with your doctor and drink plenty of fluids

## 2018-02-18 LAB — URINE CULTURE: Culture: >=100000 — AB

## 2018-02-19 ENCOUNTER — Telehealth: Payer: Self-pay

## 2018-02-19 NOTE — Telephone Encounter (Signed)
Post ED Visit - Positive Culture Follow-up  Culture report reviewed by antimicrobial stewardship pharmacist:  []  Elenor Quinones, Pharm.D. []  Heide Guile, Pharm.D., BCPS AQ-ID []  Parks Neptune, Pharm.D., BCPS []  Alycia Rossetti, Pharm.D., BCPS []  [place], Pharm.D., BCPS, AAHIVP []  Legrand Como, Pharm.D., BCPS, AAHIVP []  Salome Arnt, PharmD, BCPS []  Johnnette Gourd, PharmD, BCPS []  Hughes Better, PharmD, BCPS []  Leeroy Cha, PharmD C Charlena Cross Pharm D Positive urine culture Treated with Cephalexin, organism sensitive to the same and no further patient follow-up is required at this time.  Genia Del 02/19/2018, 9:03 AM

## 2018-02-24 ENCOUNTER — Encounter: Payer: Self-pay | Admitting: Physician Assistant

## 2018-02-24 ENCOUNTER — Ambulatory Visit: Payer: Self-pay | Admitting: Physician Assistant

## 2018-02-24 DIAGNOSIS — F39 Unspecified mood [affective] disorder: Secondary | ICD-10-CM

## 2018-02-24 NOTE — Progress Notes (Signed)
BP 100/70 (BP Location: Left Arm, Patient Position: Sitting, Cuff Size: Large)   Pulse 84   Temp 97.9 F (36.6 C)   Ht 5\' 6"  (1.676 m)   Wt 268 lb 12 oz (121.9 kg)   LMP 01/25/2018   SpO2 99%   BMI 43.38 kg/m    Subjective:    Patient ID: Kristi Martin, female    DOB: 02-05-1970, 48 y.o.   MRN: [id number]  HPI: Kristi Martin is a 48 y.o. female presenting on 02/24/2018 for Follow-up   HPI  Pt was last seen here in august.  She has been referred to neurology and orthopedics several times and has cancelled and no-showed to many many neurology and orthopedic appointments.    Pt was a no-show to her appointment here earlier this month  Interestingly, pt was given hydrocodone in ER earlier this month for cystitis.   Pt is still going to [facility] for Mental health issues.   Pt is in good spirits today and is not complaining of anything  Relevant past medical, surgical, family and social history reviewed and updated as indicated. Interim medical history since our last visit reviewed. Allergies and medications reviewed and updated.   Current Outpatient Medications:  .  calcium carbonate (TUMS - DOSED IN MG ELEMENTAL CALCIUM) 500 MG chewable tablet, Chew 2 tablets by mouth 3 (three) times daily as needed for indigestion or heartburn., Disp: , Rfl:  .  cephALEXin (KEFLEX) 500 MG capsule, Take 1 capsule (500 mg total) by mouth 4 (four) times daily., Disp: 28 capsule, Rfl: 0 .  cholecalciferol (VITAMIN D) 1000 units tablet, Take 1,000 Units by mouth daily., Disp: , Rfl:  .  citalopram (CELEXA) 20 MG tablet, Take 20 mg by mouth daily. , Disp: , Rfl:  .  Probiotic Product (PROBIOTIC PO), Take 1 capsule by mouth daily. , Disp: , Rfl:  .  traZODone (DESYREL) 100 MG tablet, Take 300 mg by mouth at bedtime. , Disp: , Rfl:  .  HYDROcodone-acetaminophen (NORCO/VICODIN) 5-325 MG tablet, Take 1 tablet by mouth every 6 (six) hours as needed for moderate pain. (Patient not taking: Reported on  02/24/2018), Disp: 20 tablet, Rfl: 0 .  ibuprofen (ADVIL,MOTRIN) 400 MG tablet, Take 1 tablet (400 mg total) by mouth every 6 (six) hours as needed. (Patient not taking: Reported on 09/30/2017), Disp: 30 tablet, Rfl: 0 .  methocarbamol (ROBAXIN) 500 MG tablet, Take 1 tablet (500 mg total) by mouth 2 (two) times daily. (Patient not taking: Reported on 02/24/2018), Disp: 20 tablet, Rfl: 0 .  Multiple Vitamin (MULTIVITAMIN WITH MINERALS) TABS tablet, Take 1 tablet by mouth daily. WOMEN'S ONE A DAY, Disp: , Rfl:  .  naproxen (NAPROSYN) 500 MG tablet, Take 1 tablet (500 mg total) by mouth 2 (two) times daily. (Patient not taking: Reported on 02/24/2018), Disp: 30 tablet, Rfl: 0   Review of Systems  Constitutional: Positive for fatigue. Negative for appetite change, chills, diaphoresis, fever and unexpected weight change.  HENT: Positive for congestion. Negative for dental problem, drooling, ear pain, facial swelling, hearing loss, mouth sores, sneezing, sore throat, trouble swallowing and voice change.   Eyes: Negative for pain, discharge, redness, itching and visual disturbance.  Respiratory: Negative for cough, choking, shortness of breath and wheezing.   Cardiovascular: Positive for leg swelling. Negative for chest pain and palpitations.  Gastrointestinal: Negative for abdominal pain, blood in stool, constipation, diarrhea and vomiting.  Endocrine: Negative for cold intolerance, heat intolerance and polydipsia.  Genitourinary: Negative  for decreased urine volume, dysuria and hematuria.  Musculoskeletal: Positive for back pain. Negative for arthralgias and gait problem.  Skin: Negative for rash.  Allergic/Immunologic: Negative for environmental allergies.  Neurological: Negative for seizures, syncope, light-headedness and headaches.  Hematological: Negative for adenopathy.  Psychiatric/Behavioral: Positive for agitation and dysphoric mood. Negative for suicidal ideas. The patient is not  nervous/anxious.     Per HPI unless specifically indicated above     Objective:    BP 100/70 (BP Location: Left Arm, Patient Position: Sitting, Cuff Size: Large)   Pulse 84   Temp 97.9 F (36.6 C)   Ht 5\' 6"  (1.676 m)   Wt 268 lb 12 oz (121.9 kg)   LMP 01/25/2018   SpO2 99%   BMI 43.38 kg/m   Wt Readings from Last 3 Encounters:  02/24/18 268 lb 12 oz (121.9 kg)  02/15/18 252 lb (114.3 kg)  12/18/17 258 lb (117 kg)    Physical Exam Vitals signs reviewed.  Constitutional:      Appearance: She is well-developed.  HENT:     Head: Normocephalic and atraumatic.  Neck:     Musculoskeletal: Neck supple.  Cardiovascular:     Rate and Rhythm: Normal rate and regular rhythm.  Pulmonary:     Effort: Pulmonary effort is normal.     Breath sounds: Normal breath sounds.  Abdominal:     General: Bowel sounds are normal.     Palpations: Abdomen is soft. There is no mass.     Tenderness: There is no abdominal tenderness.  Lymphadenopathy:     Cervical: No cervical adenopathy.  Skin:    General: Skin is warm and dry.  Neurological:     Mental Status: She is alert and oriented to person, place, and time.  Psychiatric:        Behavior: Behavior normal.         Assessment & Plan:    Encounter Diagnoses  Name Primary?  . Morbid obesity ([place]) Yes  . Mood disorder ([place])    -will order Screening mammogram -Pt never got labs drawn to check lipids, a1c.  She needs to get those done -pt to follow up 6 months.  RTO sooner prn

## 2018-03-03 ENCOUNTER — Emergency Department (HOSPITAL_COMMUNITY): Admission: EM | Admit: 2018-03-03 | Discharge: 2018-03-03 | Discharge status: Against Medical Advice | Payer: Self-pay

## 2018-03-03 NOTE — ED Notes (Signed)
Notified by registration that patient left before being triaged.

## 2018-04-18 ENCOUNTER — Emergency Department (HOSPITAL_COMMUNITY)
Admission: EM | Admit: 2018-04-18 | Discharge: 2018-04-18 | Disposition: A | Discharge status: Home / Self Care | Payer: Self-pay | Attending: Emergency Medicine | Admitting: Emergency Medicine

## 2018-04-18 ENCOUNTER — Emergency Department (HOSPITAL_COMMUNITY): Payer: Self-pay

## 2018-04-18 ENCOUNTER — Encounter (HOSPITAL_COMMUNITY): Payer: Self-pay | Admitting: Emergency Medicine

## 2018-04-18 ENCOUNTER — Other Ambulatory Visit: Payer: Self-pay

## 2018-04-18 DIAGNOSIS — R55 Syncope and collapse: Secondary | ICD-10-CM

## 2018-04-18 DIAGNOSIS — Z79899 Other long term (current) drug therapy: Secondary | ICD-10-CM | POA: Insufficient documentation

## 2018-04-18 DIAGNOSIS — J45909 Unspecified asthma, uncomplicated: Secondary | ICD-10-CM | POA: Insufficient documentation

## 2018-04-18 DIAGNOSIS — Z8541 Personal history of malignant neoplasm of cervix uteri: Secondary | ICD-10-CM | POA: Insufficient documentation

## 2018-04-18 DIAGNOSIS — B349 Viral infection, unspecified: Secondary | ICD-10-CM | POA: Insufficient documentation

## 2018-04-18 DIAGNOSIS — I129 Hypertensive chronic kidney disease with stage 1 through stage 4 chronic kidney disease, or unspecified chronic kidney disease: Secondary | ICD-10-CM | POA: Insufficient documentation

## 2018-04-18 DIAGNOSIS — N183 Chronic kidney disease, stage 3 (moderate): Secondary | ICD-10-CM | POA: Insufficient documentation

## 2018-04-18 LAB — COMPREHENSIVE METABOLIC PANEL
ALK PHOS: 55 U/L (ref 38–126)
ALT: 8 U/L (ref 0–44)
AST: 13 U/L — ABNORMAL LOW (ref 15–41)
Albumin: 2.9 g/dL — ABNORMAL LOW (ref 3.5–5.0)
Anion gap: 5 (ref 5–15)
BUN: 11 mg/dL (ref 6–20)
CO2: 25 mmol/L (ref 22–32)
Calcium: 8.3 mg/dL — ABNORMAL LOW (ref 8.9–10.3)
Chloride: 109 mmol/L (ref 98–111)
Creatinine, Ser: 1.22 mg/dL — ABNORMAL HIGH (ref 0.44–1.00)
GFR calc Af Amer: >60 mL/min (ref >60)
GFR calc non Af Amer: 52 mL/min — ABNORMAL LOW (ref >60)
Glucose, Bld: 102 mg/dL — ABNORMAL HIGH (ref 70–99)
Potassium: 3.8 mmol/L (ref 3.5–5.1)
Sodium: 139 mmol/L (ref 135–145)
Total Bilirubin: 0.1 mg/dL — ABNORMAL LOW (ref 0.3–1.2)
Total Protein: 5.9 g/dL — ABNORMAL LOW (ref 6.5–8.1)

## 2018-04-18 LAB — URINALYSIS, ROUTINE W REFLEX MICROSCOPIC
Bacteria, UA: NONE SEEN
Bilirubin Urine: NEGATIVE
Glucose, UA: NEGATIVE mg/dL
Ketones, ur: NEGATIVE mg/dL
Leukocytes,Ua: NEGATIVE
Nitrite: NEGATIVE
Protein, ur: NEGATIVE mg/dL
RBC / HPF: >50 RBC/hpf — ABNORMAL HIGH (ref 0–5)
SPECIFIC GRAVITY, URINE: 1.014 (ref 1.005–1.030)
pH: 6 (ref 5.0–8.0)

## 2018-04-18 LAB — CBC WITH DIFFERENTIAL/PLATELET
Abs Immature Granulocytes: 0.02 10*3/uL (ref 0.00–0.07)
Basophils Absolute: 0 10*3/uL (ref 0.0–0.1)
Basophils Relative: 1 %
Eosinophils Absolute: 0.1 10*3/uL (ref 0.0–0.5)
Eosinophils Relative: 2 %
HCT: 36.7 % (ref 36.0–46.0)
Hemoglobin: 11.3 g/dL — ABNORMAL LOW (ref 12.0–15.0)
Immature Granulocytes: 0 %
Lymphocytes Relative: 28 %
Lymphs Abs: 1.3 10*3/uL (ref 0.7–4.0)
MCH: 26.7 pg (ref 26.0–34.0)
MCHC: 30.8 g/dL (ref 30.0–36.0)
MCV: 86.6 fL (ref 80.0–100.0)
Monocytes Absolute: 0.5 10*3/uL (ref 0.1–1.0)
Monocytes Relative: 11 %
Neutro Abs: 2.6 10*3/uL (ref 1.7–7.7)
Neutrophils Relative %: 58 %
Platelets: 241 10*3/uL (ref 150–400)
RBC: 4.24 MIL/uL (ref 3.87–5.11)
RDW: 14.6 % (ref 11.5–15.5)
WBC: 4.5 10*3/uL (ref 4.0–10.5)
nRBC: 0 % (ref 0.0–0.2)

## 2018-04-18 LAB — TROPONIN I: Troponin I: <0.03 ng/mL (ref <0.03)

## 2018-04-18 LAB — PREGNANCY, URINE: Preg Test, Ur: NEGATIVE

## 2018-04-18 MED ORDER — SODIUM CHLORIDE 0.9 % IV BOLUS
500.0000 mL | Freq: Once | INTRAVENOUS | Status: AC
  Administered 2018-04-18: 500 mL via INTRAVENOUS

## 2018-04-18 MED ORDER — KETOROLAC TROMETHAMINE 30 MG/ML IJ SOLN
15.0000 mg | Freq: Once | INTRAMUSCULAR | Status: AC
  Administered 2018-04-18: 15 mg via INTRAVENOUS
  Filled 2018-04-18: qty 1

## 2018-04-18 NOTE — ED Notes (Signed)
Pt request pain meds   Dr Mamie Nick apprsed

## 2018-04-18 NOTE — ED Notes (Signed)
Pt reports that they are taking her son to [facility] and will not tell her what has happened   IV removed, pt declines VS

## 2018-04-18 NOTE — ED Provider Notes (Signed)
[facility] Provider Note   CSN: [id number] Arrival date & time: 04/18/18  1407    History   Chief Complaint Chief Complaint  Patient presents with  . Loss of Consciousness    twice today per patient    HPI Kristi Martin is a 48 y.o. female.     HPI Patient with cough body aches.  Has had since yesterday.  States she feels bad all over.  States her muscles ache in her mid back and chest hurts.  States she has passed out a couple times today.  Has had episodes of passing out in the past.  States she has had nausea without vomiting.  No diarrhea.  States she just feels bad.  No fevers.  Pain is dull.  No dysuria.  No sore throat.  No definite sick contacts. Past Medical History:  Diagnosis Date  . Anemia   . Anxiety   . Asthma    as child  . Cancer ([place])    cervical cx at age 34  . Cervical cancer ([place])   . Chronic back pain   . Chronic kidney disease   . CKD (chronic kidney disease), symptom management only, stage 3 (moderate) ([place])   . DDD (degenerative disc disease), lumbar    Facet arthritis  . GERD (gastroesophageal reflux disease)   . Hypotension   . Left knee pain   . Migraine   . Peripheral neuropathy   . Sciatica   . Substance abuse ([place])   . Vaginal Pap smear, abnormal     Patient Active Problem List   Diagnosis Date Noted  . Substance abuse ([place]) 05/27/2016  . MDD (major depressive disorder) 06/08/2015  . GAD (generalized anxiety disorder) 02/07/2015  . DDD (degenerative disc disease), lumbar 10/10/2014  . Facet arthropathy, lumbar 10/10/2014  . HGSIL (high grade squamous intraepithelial lesion) on Pap smear of cervix 08/04/2014  . Chronic back pain 07/27/2014  . GERD (gastroesophageal reflux disease) 07/27/2014  . Peripheral neuropathy 06/28/2014  . Obesity 06/28/2014  . Glucose intolerance (impaired glucose tolerance) 04/03/2014  . Chronic anemia 04/01/2014  . CKD (chronic kidney disease) stage 3, GFR 30-59 ml/min ([place])  04/01/2014    Past Surgical History:  Procedure Laterality Date  . CERVICAL CONIZATION W/BX N/A 04/01/2017   Procedure: LASER CONIZATION CERVIX WITH BIOPSY;  Surgeon: Florian Buff, MD;  Location: [facility];  Service: Gynecology;  Laterality: N/A;  . CHOLECYSTECTOMY N/A 01/20/2013   Procedure: LAPAROSCOPIC CHOLECYSTECTOMY WITH INTRAOPERATIVE CHOLANGIOGRAM;  Surgeon: Shann Medal, MD;  Location: [facility];  Service: General;  Laterality: N/A;  . TUBAL LIGATION       OB History    Gravida  9   Para  7   Term  5   Preterm  2   AB  2   Living  7     SAB  2   TAB      Ectopic      Multiple      Live Births  1            Home Medications    Prior to Admission medications   Medication Sig Start Date End Date Taking? Authorizing Provider  calcium carbonate (TUMS - DOSED IN MG ELEMENTAL CALCIUM) 500 MG chewable tablet Chew 2 tablets by mouth 3 (three) times daily as needed for indigestion or heartburn.    [provider]  cephALEXin (KEFLEX) 500 MG capsule Take 1 capsule (500 mg total) by mouth 4 (  four) times daily. Patient not taking: Reported on 04/18/2018 02/15/18   Milton Ferguson, MD  cholecalciferol (VITAMIN D) 1000 units tablet Take 1,000 Units by mouth daily.    [provider]  citalopram (CELEXA) 20 MG tablet Take 20 mg by mouth daily.     [provider]  HYDROcodone-acetaminophen (NORCO/VICODIN) 5-325 MG tablet Take 1 tablet by mouth every 6 (six) hours as needed for moderate pain. Patient not taking: Reported on 02/24/2018 02/15/18   Milton Ferguson, MD  ibuprofen (ADVIL,MOTRIN) 400 MG tablet Take 1 tablet (400 mg total) by mouth every 6 (six) hours as needed. Patient not taking: Reported on 09/30/2017 09/25/17   Noemi Chapel, MD  methocarbamol (ROBAXIN) 500 MG tablet Take 1 tablet (500 mg total) by mouth 2 (two) times daily. Patient not taking: Reported on 02/24/2018 12/18/17   Jacqlyn Larsen, PA-C  Multiple Vitamin (MULTIVITAMIN WITH  MINERALS) TABS tablet Take 1 tablet by mouth daily. WOMEN'S ONE A DAY    [provider]  naproxen (NAPROSYN) 500 MG tablet Take 1 tablet (500 mg total) by mouth 2 (two) times daily. Patient not taking: Reported on 02/24/2018 12/18/17   Jacqlyn Larsen, PA-C  Probiotic Product (PROBIOTIC PO) Take 1 capsule by mouth daily.     [provider]  traZODone (DESYREL) 100 MG tablet Take 300 mg by mouth at bedtime.     [provider]    Family History Family History  Problem Relation Age of Onset  . Cancer Mother   . Colon cancer Mother   . Breast cancer Maternal Grandmother   . Heart disease Maternal Grandmother   . Cancer Maternal Grandmother   . Breast cancer Paternal Grandmother   . Heart disease Paternal Grandmother   . Hypertension Paternal Grandmother   . Alcohol abuse Father   . Drug abuse Father   . Heart disease Father   . Asthma Brother   . Cancer Paternal Grandfather     Social History Social History   Tobacco Use  . Smoking status: Never Smoker  . Smokeless tobacco: Never Used  Substance Use Topics  . Alcohol use: Yes    Alcohol/week: 0.0 standard drinks    Comment: occasional  . Drug use: Not Currently    Types: "Crack" cocaine     Allergies   Benadryl [diphenhydramine hcl] and Onion   Review of Systems Review of Systems  Constitutional: Positive for appetite change. Negative for chills and fever.  HENT: Positive for congestion.   Respiratory: Positive for cough.   Cardiovascular: Positive for chest pain.  Gastrointestinal: Positive for abdominal pain.  Genitourinary: Positive for flank pain.  Musculoskeletal: Positive for back pain and myalgias.  Skin: Negative for rash.  Neurological: Positive for syncope.  Psychiatric/Behavioral: Negative for confusion.     Physical Exam Updated Vital Signs BP 118/82 (BP Location: Right Arm)   Pulse 91   Temp 98 F (36.7 C) (Oral)   Resp 18   Ht 5\' 6"  (1.676 m)   Wt 117.9 kg   LMP  04/18/2018   SpO2 100%   BMI 41.97 kg/m   Physical Exam Vitals signs and nursing note reviewed.  HENT:     Head: Atraumatic.     Mouth/Throat:     Mouth: Mucous membranes are moist.  Eyes:     Extraocular Movements: Extraocular movements intact.  Neck:     Musculoskeletal: Neck supple.  Cardiovascular:     Rate and Rhythm: Regular rhythm.  Pulmonary:  Comments: Some tenderness on bilateral lower posterior chest. Abdominal:     Tenderness: There is no abdominal tenderness.  Musculoskeletal:     Comments: Mild diffuse tenderness over muscles.  Skin:    Capillary Refill: Capillary refill takes less than 2 seconds.  Neurological:     Mental Status: She is alert and oriented to person, place, and time.      ED Treatments / Results  Labs (all labs ordered are listed, but only abnormal results are displayed) Labs Reviewed  URINALYSIS, ROUTINE W REFLEX MICROSCOPIC - Abnormal; Notable for the following components:      Result Value   APPearance HAZY (*)    Hgb urine dipstick LARGE (*)    RBC / HPF >50 (*)    All other components within normal limits  COMPREHENSIVE METABOLIC PANEL - Abnormal; Notable for the following components:   Glucose, Bld 102 (*)    Creatinine, Ser 1.22 (*)    Calcium 8.3 (*)    Total Protein 5.9 (*)    Albumin 2.9 (*)    AST 13 (*)    Total Bilirubin 0.1 (*)    GFR calc non Af Amer 52 (*)    All other components within normal limits  CBC WITH DIFFERENTIAL/PLATELET - Abnormal; Notable for the following components:   Hemoglobin 11.3 (*)    All other components within normal limits  TROPONIN I  PREGNANCY, URINE    EKG EKG Interpretation  Date/Time:  Sunday April 18 2018 14:48:33 EDT Ventricular Rate:  70 PR Interval:    QRS Duration: 74 QT Interval:  396 QTC Calculation: 428 R Axis:   38 Text Interpretation:  Sinus rhythm Low voltage, precordial leads Abnormal R-wave progression, early transition No significant change since last  tracing Confirmed by Davonna Belling [phone]) on 04/18/2018 3:01:10 PM   Radiology Dg Chest 2 View  Result Date: 04/18/2018 CLINICAL DATA:  Chest pain. EXAM: CHEST - 2 VIEW COMPARISON:  February 15, 2018 FINDINGS: The heart size and mediastinal contours are within normal limits. Both lungs are clear. The visualized skeletal structures are unremarkable. IMPRESSION: No active cardiopulmonary disease. Electronically Signed   By: Dorise Bullion III M.D   On: 04/18/2018 16:01    Procedures Procedures (including critical care time)  Medications Ordered in ED Medications  sodium chloride 0.9 % bolus 500 mL (0 mLs Intravenous Stopped 04/18/18 1721)  ketorolac (TORADOL) 30 MG/ML injection 15 mg (15 mg Intravenous Given 04/18/18 1531)     Initial Impression / Assessment and Plan / ED Course  I have reviewed the triage vital signs and the nursing notes.  Pertinent labs & imaging results that were available during my care of the patient were reviewed by me and considered in my medical decision making (see chart for details).        Patient with myalgias.  Likely viral syndrome.  Work-up reassuring.  Had had syncopal work-up previously and has had previous syncopal events.  Feels somewhat better after treatment.  Will discharge home.  Final Clinical Impressions(s) / ED Diagnoses   Final diagnoses:  Viral infection  Syncope, unspecified syncope type    ED Discharge Orders    None       Davonna Belling, MD 04/19/18 0009

## 2018-04-18 NOTE — ED Triage Notes (Signed)
Patient reports body aches and fever yesterday with intermittent cough.  Patient reports "passing out" twice today, stating she found herself lying on the floor this morning, got up to lay back down.  Significant other found patient laying outside on the ground for unknown amount of time, was helped into car to the ER.

## 2018-06-25 ENCOUNTER — Emergency Department (HOSPITAL_COMMUNITY): Payer: Self-pay

## 2018-06-25 ENCOUNTER — Encounter (HOSPITAL_COMMUNITY): Payer: Self-pay

## 2018-06-25 ENCOUNTER — Other Ambulatory Visit: Payer: Self-pay

## 2018-06-25 ENCOUNTER — Emergency Department (HOSPITAL_COMMUNITY)
Admission: EM | Admit: 2018-06-25 | Discharge: 2018-06-26 | Disposition: A | Discharge status: Home / Self Care | Payer: Self-pay | Attending: Emergency Medicine | Admitting: Emergency Medicine

## 2018-06-25 DIAGNOSIS — N8500 Endometrial hyperplasia, unspecified: Secondary | ICD-10-CM | POA: Insufficient documentation

## 2018-06-25 DIAGNOSIS — R102 Pelvic and perineal pain: Secondary | ICD-10-CM

## 2018-06-25 DIAGNOSIS — N183 Chronic kidney disease, stage 3 (moderate): Secondary | ICD-10-CM | POA: Insufficient documentation

## 2018-06-25 DIAGNOSIS — Z79899 Other long term (current) drug therapy: Secondary | ICD-10-CM | POA: Insufficient documentation

## 2018-06-25 DIAGNOSIS — N939 Abnormal uterine and vaginal bleeding, unspecified: Secondary | ICD-10-CM | POA: Insufficient documentation

## 2018-06-25 DIAGNOSIS — J45909 Unspecified asthma, uncomplicated: Secondary | ICD-10-CM | POA: Insufficient documentation

## 2018-06-25 LAB — BASIC METABOLIC PANEL
Anion gap: 7 (ref 5–15)
BUN: 9 mg/dL (ref 6–20)
CO2: 22 mmol/L (ref 22–32)
Calcium: 7.9 mg/dL — ABNORMAL LOW (ref 8.9–10.3)
Chloride: 109 mmol/L (ref 98–111)
Creatinine, Ser: 1.11 mg/dL — ABNORMAL HIGH (ref 0.44–1.00)
GFR calc Af Amer: >60 mL/min (ref >60)
GFR calc non Af Amer: 58 mL/min — ABNORMAL LOW (ref >60)
Glucose, Bld: 107 mg/dL — ABNORMAL HIGH (ref 70–99)
Potassium: 3.6 mmol/L (ref 3.5–5.1)
Sodium: 138 mmol/L (ref 135–145)

## 2018-06-25 LAB — CBC WITH DIFFERENTIAL/PLATELET
Abs Immature Granulocytes: 0.02 10*3/uL (ref 0.00–0.07)
Basophils Absolute: 0.1 10*3/uL (ref 0.0–0.1)
Basophils Relative: 1 %
Eosinophils Absolute: 0 10*3/uL (ref 0.0–0.5)
Eosinophils Relative: 1 %
HCT: 37.9 % (ref 36.0–46.0)
Hemoglobin: 12 g/dL (ref 12.0–15.0)
Immature Granulocytes: 0 %
Lymphocytes Relative: 23 %
Lymphs Abs: 1.4 10*3/uL (ref 0.7–4.0)
MCH: 26.5 pg (ref 26.0–34.0)
MCHC: 31.7 g/dL (ref 30.0–36.0)
MCV: 83.8 fL (ref 80.0–100.0)
Monocytes Absolute: 0.6 10*3/uL (ref 0.1–1.0)
Monocytes Relative: 10 %
Neutro Abs: 4 10*3/uL (ref 1.7–7.7)
Neutrophils Relative %: 65 %
Platelets: 243 10*3/uL (ref 150–400)
RBC: 4.52 MIL/uL (ref 3.87–5.11)
RDW: 15.7 % — ABNORMAL HIGH (ref 11.5–15.5)
WBC: 6.1 10*3/uL (ref 4.0–10.5)
nRBC: 0 % (ref 0.0–0.2)

## 2018-06-25 LAB — POC URINE PREG, ED: Preg Test, Ur: NEGATIVE

## 2018-06-25 MED ORDER — IOHEXOL 300 MG/ML  SOLN
100.0000 mL | Freq: Once | INTRAMUSCULAR | Status: AC | PRN
  Administered 2018-06-25: 100 mL via INTRAVENOUS

## 2018-06-25 MED ORDER — HYDROMORPHONE HCL 1 MG/ML IJ SOLN
1.0000 mg | Freq: Once | INTRAMUSCULAR | Status: AC
  Administered 2018-06-25: 1 mg via INTRAVENOUS
  Filled 2018-06-25: qty 1

## 2018-06-25 MED ORDER — HYDROMORPHONE HCL 1 MG/ML IJ SOLN
0.5000 mg | Freq: Once | INTRAMUSCULAR | Status: AC
  Administered 2018-06-25: 0.5 mg via INTRAVENOUS
  Filled 2018-06-25: qty 1

## 2018-06-25 MED ORDER — SODIUM CHLORIDE 0.9 % IV SOLN: 1000.0000 mL | INTRAVENOUS | Status: DC

## 2018-06-25 MED ORDER — ONDANSETRON HCL 4 MG/2ML IJ SOLN
4.0000 mg | Freq: Once | INTRAMUSCULAR | Status: AC
  Administered 2018-06-25: 4 mg via INTRAVENOUS
  Filled 2018-06-25: qty 2

## 2018-06-25 MED ORDER — PROCHLORPERAZINE EDISYLATE 10 MG/2ML IJ SOLN
5.0000 mg | Freq: Once | INTRAMUSCULAR | Status: AC
  Administered 2018-06-25: 5 mg via INTRAVENOUS
  Filled 2018-06-25: qty 2

## 2018-06-25 MED ORDER — SODIUM CHLORIDE 0.9 % IV BOLUS (SEPSIS)
1000.0000 mL | Freq: Once | INTRAVENOUS | Status: AC
  Administered 2018-06-25: 20:00:00 1000 mL via INTRAVENOUS

## 2018-06-25 NOTE — ED Notes (Signed)
Patient in CT at this time.

## 2018-06-25 NOTE — ED Notes (Signed)
Sat patient behind triage while room is getting clean, then will move patient to back due to low BP

## 2018-06-25 NOTE — ED Notes (Signed)
Pt ambulatory to bathroom- pt given feminine pad, wash cloths, and urine cup.

## 2018-06-25 NOTE — ED Triage Notes (Signed)
Pt is having abdominal pain that started this morning. Pt has been having vaginal bleeding for 4 weeks. Has not seen an OB about bleeding. Pt will not give much history as she is screaming in triage.

## 2018-06-25 NOTE — ED Provider Notes (Signed)
[facility] EMERGENCY DEPARTMENT Provider Note   CSN: [id number] Arrival date & time: 06/25/18  1839    History   Chief Complaint No chief complaint on file.   HPI Kristi Martin is a 48 y.o. female.     Patient is a 48 year old female who presents to the emergency department with a complaint of lower abdomen pain.  The patient states that she has been having problems with vaginal bleeding over the past 4 weeks.  On yesterday she started having some lower abdomen pain.  Today she started having lower abdomen pain accompanied by bleeding.  She has been having some passing of just a few clots during this for week.  But today the bleeding became heavy with multiple clots.  She has saturated about 5 pads today.  Upon her arrival to the emergency department she was in severe pain actually screaming out in the triage area.  The patient denies being on any anticoagulation medications.  She has no history of anticoagulation problems.  It is of note that she had cervical cancer at age 35.  She has been having close follow-up since that time with Pap smears.  She has not seen her GYN physician over these 4 weeks during the vaginal bleeding.  Patient has also had a tubal ligation.  She presents now for assistance with this issue.  The history is provided by the patient.    Past Medical History:  Diagnosis Date  . Anemia   . Anxiety   . Asthma    as child  . Cancer ([place])    cervical cx at age 48  . Cervical cancer ([place])   . Chronic back pain   . Chronic kidney disease   . CKD (chronic kidney disease), symptom management only, stage 3 (moderate) ([place])   . DDD (degenerative disc disease), lumbar    Facet arthritis  . GERD (gastroesophageal reflux disease)   . Hypotension   . Left knee pain   . Migraine   . Peripheral neuropathy   . Sciatica   . Substance abuse ([place])   . Vaginal Pap smear, abnormal     Patient Active Problem List   Diagnosis Date Noted  . Substance abuse ([place])  05/27/2016  . MDD (major depressive disorder) 06/08/2015  . GAD (generalized anxiety disorder) 02/07/2015  . DDD (degenerative disc disease), lumbar 10/10/2014  . Facet arthropathy, lumbar 10/10/2014  . HGSIL (high grade squamous intraepithelial lesion) on Pap smear of cervix 08/04/2014  . Chronic back pain 07/27/2014  . GERD (gastroesophageal reflux disease) 07/27/2014  . Peripheral neuropathy 06/28/2014  . Obesity 06/28/2014  . Glucose intolerance (impaired glucose tolerance) 04/03/2014  . Chronic anemia 04/01/2014  . CKD (chronic kidney disease) stage 3, GFR 30-59 ml/min ([place]) 04/01/2014    Past Surgical History:  Procedure Laterality Date  . CERVICAL CONIZATION W/BX N/A 04/01/2017   Procedure: LASER CONIZATION CERVIX WITH BIOPSY;  Surgeon: Florian Buff, MD;  Location: [facility];  Service: Gynecology;  Laterality: N/A;  . CHOLECYSTECTOMY N/A 01/20/2013   Procedure: LAPAROSCOPIC CHOLECYSTECTOMY WITH INTRAOPERATIVE CHOLANGIOGRAM;  Surgeon: Shann Medal, MD;  Location: [facility];  Service: General;  Laterality: N/A;  . TUBAL LIGATION       OB History    Gravida  9   Para  7   Term  5   Preterm  2   AB  2   Living  7     SAB  2   TAB  Ectopic      Multiple      Live Births  1            Home Medications    Prior to Admission medications   Medication Sig Start Date End Date Taking? Authorizing Provider  calcium carbonate (TUMS - DOSED IN MG ELEMENTAL CALCIUM) 500 MG chewable tablet Chew 2 tablets by mouth 3 (three) times daily as needed for indigestion or heartburn.    [provider]  cephALEXin (KEFLEX) 500 MG capsule Take 1 capsule (500 mg total) by mouth 4 (four) times daily. Patient not taking: Reported on 04/18/2018 02/15/18   Milton Ferguson, MD  cholecalciferol (VITAMIN D) 1000 units tablet Take 1,000 Units by mouth daily.    [provider]  citalopram (CELEXA) 20 MG tablet Take 20 mg by mouth daily.     [provider]   HYDROcodone-acetaminophen (NORCO/VICODIN) 5-325 MG tablet Take 1 tablet by mouth every 6 (six) hours as needed for moderate pain. Patient not taking: Reported on 02/24/2018 02/15/18   Milton Ferguson, MD  ibuprofen (ADVIL,MOTRIN) 400 MG tablet Take 1 tablet (400 mg total) by mouth every 6 (six) hours as needed. Patient not taking: Reported on 09/30/2017 09/25/17   Noemi Chapel, MD  methocarbamol (ROBAXIN) 500 MG tablet Take 1 tablet (500 mg total) by mouth 2 (two) times daily. Patient not taking: Reported on 02/24/2018 12/18/17   Jacqlyn Larsen, PA-C  Multiple Vitamin (MULTIVITAMIN WITH MINERALS) TABS tablet Take 1 tablet by mouth daily. WOMEN'S ONE A DAY    [provider]  naproxen (NAPROSYN) 500 MG tablet Take 1 tablet (500 mg total) by mouth 2 (two) times daily. Patient not taking: Reported on 02/24/2018 12/18/17   Jacqlyn Larsen, PA-C  Probiotic Product (PROBIOTIC PO) Take 1 capsule by mouth daily.     [provider]  traZODone (DESYREL) 100 MG tablet Take 300 mg by mouth at bedtime.     [provider]    Family History Family History  Problem Relation Age of Onset  . Cancer Mother   . Colon cancer Mother   . Breast cancer Maternal Grandmother   . Heart disease Maternal Grandmother   . Cancer Maternal Grandmother   . Breast cancer Paternal Grandmother   . Heart disease Paternal Grandmother   . Hypertension Paternal Grandmother   . Alcohol abuse Father   . Drug abuse Father   . Heart disease Father   . Asthma Brother   . Cancer Paternal Grandfather     Social History Social History   Tobacco Use  . Smoking status: Never Smoker  . Smokeless tobacco: Never Used  Substance Use Topics  . Alcohol use: Yes    Alcohol/week: 0.0 standard drinks    Comment: occasional  . Drug use: Not Currently    Types: "Crack" cocaine     Allergies   Benadryl [diphenhydramine hcl] and Onion   Review of Systems Review of Systems  Constitutional: Negative for  activity change.       All ROS Neg except as noted in HPI  HENT: Negative for nosebleeds.   Eyes: Negative for photophobia and discharge.  Respiratory: Negative for cough, shortness of breath and wheezing.   Cardiovascular: Negative for chest pain and palpitations.  Gastrointestinal: Positive for abdominal pain. Negative for blood in stool.  Genitourinary: Positive for vaginal bleeding. Negative for dysuria, frequency and hematuria.  Musculoskeletal: Negative for arthralgias, back pain and neck pain.  Skin: Negative.   Neurological:  Negative for dizziness, seizures and speech difficulty.  Psychiatric/Behavioral: Negative for confusion and hallucinations.     Physical Exam Updated Vital Signs BP (!) 84/66 (BP Location: Left Arm)   Pulse (!) 103   Resp (!) 25 Comment: Hyperventilating  Wt 123.4 kg   LMP 06/03/2018   SpO2 96%   BMI 43.90 kg/m   Physical Exam Vitals signs and nursing note reviewed.  Constitutional:      Appearance: She is well-developed. She is not toxic-appearing.  HENT:     Head: Normocephalic.     Right Ear: Tympanic membrane and external ear normal.     Left Ear: Tympanic membrane and external ear normal.  Eyes:     General: Lids are normal.     Pupils: Pupils are equal, round, and reactive to light.  Neck:     Musculoskeletal: Normal range of motion and neck supple.     Vascular: No carotid bruit.  Cardiovascular:     Rate and Rhythm: Normal rate and regular rhythm.     Pulses: Normal pulses.     Heart sounds: Normal heart sounds.  Pulmonary:     Effort: No respiratory distress.     Breath sounds: Normal breath sounds.  Abdominal:     General: Bowel sounds are normal.     Palpations: Abdomen is soft.     Tenderness: There is abdominal tenderness in the suprapubic area and left lower quadrant. There is no guarding.    Musculoskeletal: Normal range of motion.  Lymphadenopathy:     Head:     Right side of head: No submandibular adenopathy.      Left side of head: No submandibular adenopathy.     Cervical: No cervical adenopathy.  Skin:    General: Skin is warm and dry.  Neurological:     Mental Status: She is alert and oriented to person, place, and time.     Cranial Nerves: No cranial nerve deficit.     Sensory: No sensory deficit.  Psychiatric:        Speech: Speech normal.      ED Treatments / Results  Labs (all labs ordered are listed, but only abnormal results are displayed) Labs Reviewed  CBC WITH DIFFERENTIAL/PLATELET  BASIC METABOLIC PANEL  POC URINE PREG, ED    EKG None  Radiology No results found.  Procedures Procedures (including critical care time)  Medications Ordered in ED Medications  HYDROmorphone (DILAUDID) injection 0.5 mg (has no administration in time range)  ondansetron (ZOFRAN) injection 4 mg (has no administration in time range)  sodium chloride 0.9 % bolus 1,000 mL (has no administration in time range)    Followed by  0.9 %  sodium chloride infusion (has no administration in time range)     Initial Impression / Assessment and Plan / ED Course  I have reviewed the triage vital signs and the nursing notes.  Pertinent labs & imaging results that were available during my care of the patient were reviewed by me and considered in my medical decision making (see chart for details).          Final Clinical Impressions(s) / ED Diagnoses MDM  Patient complains of 4 weeks of vaginal bleeding.  Increasing pain.  Today increasing bleeding with clots.  IV fluids started.  IV pain medication ordered.  Will obtain blood work and CT abdominal/pelvis scan.  Urine preg test neg. Cbc wnl Creat elevated at 1.11 CT - Thickened endometrial stripe within the lower uterine segment. [place]  recommended.  Pt required additonal pain medication and had to change pads.  At discharge, pt pain improved. Vital signs stable. Pt ambulatory. [place] scheduled for tomorrow.   Final diagnoses:  Abnormal vaginal  bleeding  Endometrial hyperplasia    ED Discharge Orders         Ordered    US Transvaginal Non-OB (US Pelvis)     06/26/18 0035    HYDROcodone-acetaminophen (NORCO/VICODIN) 5-325 MG tablet  Every 4 hours PRN     06/26/18 0036           Lily Kocher, PA-C 06/26/18 1019    Nat Christen, MD 06/30/18 [phone]

## 2018-06-26 MED ORDER — HYDROCODONE-ACETAMINOPHEN 5-325 MG PO TABS: 1.0000 | ORAL_TABLET | ORAL | 0 refills | Status: DC | PRN

## 2018-06-26 NOTE — Discharge Instructions (Addendum)
Your white blood cell count, hemoglobin, and hematocrit are all stable.  Your electrolytes are stable.  The CT scan shows some swelling involving the lining/endometrium of your wound.  I have requested a ultrasound get better information of this area.  Please use Tylenol extra strength every 4 hours for mild pain.  May use Norco for more severe pain.  Please call the number on your discharge instructions in the morning to secure an appointment time for your ultrasound.  Once you have received an appointment time, go directly to the radiology department.  After the test is completed, you may come back to the emergency department waiting area to receive your results.  Please let the registration people know that you waiting on ultrasound results.

## 2018-06-27 ENCOUNTER — Other Ambulatory Visit: Payer: Self-pay

## 2018-06-27 ENCOUNTER — Ambulatory Visit (HOSPITAL_COMMUNITY)
Admission: RE | Admit: 2018-06-27 | Discharge: 2018-06-27 | Disposition: A | Discharge status: Home / Self Care | Payer: Self-pay | Source: Ambulatory Visit | Attending: Physician Assistant | Admitting: Physician Assistant

## 2018-06-27 ENCOUNTER — Ambulatory Visit (HOSPITAL_COMMUNITY): Payer: Self-pay

## 2018-06-27 DIAGNOSIS — R102 Pelvic and perineal pain: Secondary | ICD-10-CM | POA: Insufficient documentation

## 2018-07-24 ENCOUNTER — Other Ambulatory Visit: Payer: Self-pay

## 2018-07-24 ENCOUNTER — Emergency Department (HOSPITAL_COMMUNITY): Payer: Self-pay

## 2018-07-24 ENCOUNTER — Encounter (HOSPITAL_COMMUNITY): Payer: Self-pay | Admitting: Emergency Medicine

## 2018-07-24 ENCOUNTER — Emergency Department (HOSPITAL_COMMUNITY)
Admission: EM | Admit: 2018-07-24 | Discharge: 2018-07-24 | Disposition: A | Discharge status: Home / Self Care | Payer: Self-pay | Attending: Emergency Medicine | Admitting: Emergency Medicine

## 2018-07-24 DIAGNOSIS — N939 Abnormal uterine and vaginal bleeding, unspecified: Secondary | ICD-10-CM | POA: Insufficient documentation

## 2018-07-24 DIAGNOSIS — Z79899 Other long term (current) drug therapy: Secondary | ICD-10-CM | POA: Insufficient documentation

## 2018-07-24 DIAGNOSIS — N760 Acute vaginitis: Secondary | ICD-10-CM | POA: Insufficient documentation

## 2018-07-24 DIAGNOSIS — I129 Hypertensive chronic kidney disease with stage 1 through stage 4 chronic kidney disease, or unspecified chronic kidney disease: Secondary | ICD-10-CM | POA: Insufficient documentation

## 2018-07-24 DIAGNOSIS — B3731 Acute candidiasis of vulva and vagina: Secondary | ICD-10-CM

## 2018-07-24 DIAGNOSIS — B9689 Other specified bacterial agents as the cause of diseases classified elsewhere: Secondary | ICD-10-CM

## 2018-07-24 DIAGNOSIS — B373 Candidiasis of vulva and vagina: Secondary | ICD-10-CM | POA: Insufficient documentation

## 2018-07-24 DIAGNOSIS — J45909 Unspecified asthma, uncomplicated: Secondary | ICD-10-CM | POA: Insufficient documentation

## 2018-07-24 DIAGNOSIS — N183 Chronic kidney disease, stage 3 (moderate): Secondary | ICD-10-CM | POA: Insufficient documentation

## 2018-07-24 LAB — COMPREHENSIVE METABOLIC PANEL
ALT: 10 U/L (ref 0–44)
AST: 14 U/L — ABNORMAL LOW (ref 15–41)
Albumin: 2.7 g/dL — ABNORMAL LOW (ref 3.5–5.0)
Alkaline Phosphatase: 61 U/L (ref 38–126)
Anion gap: 5 (ref 5–15)
BUN: 8 mg/dL (ref 6–20)
CO2: 27 mmol/L (ref 22–32)
Calcium: 7.7 mg/dL — ABNORMAL LOW (ref 8.9–10.3)
Chloride: 107 mmol/L (ref 98–111)
Creatinine, Ser: 1.06 mg/dL — ABNORMAL HIGH (ref 0.44–1.00)
GFR calc Af Amer: >60 mL/min (ref >60)
GFR calc non Af Amer: >60 mL/min (ref >60)
Glucose, Bld: 111 mg/dL — ABNORMAL HIGH (ref 70–99)
Potassium: 3.7 mmol/L (ref 3.5–5.1)
Sodium: 139 mmol/L (ref 135–145)
Total Bilirubin: 0.2 mg/dL — ABNORMAL LOW (ref 0.3–1.2)
Total Protein: 5.6 g/dL — ABNORMAL LOW (ref 6.5–8.1)

## 2018-07-24 LAB — CBC WITH DIFFERENTIAL/PLATELET
Abs Immature Granulocytes: 0.03 10*3/uL (ref 0.00–0.07)
Basophils Absolute: 0 10*3/uL (ref 0.0–0.1)
Basophils Relative: 1 %
Eosinophils Absolute: 0.1 10*3/uL (ref 0.0–0.5)
Eosinophils Relative: 2 %
HCT: 34 % — ABNORMAL LOW (ref 36.0–46.0)
Hemoglobin: 10.2 g/dL — ABNORMAL LOW (ref 12.0–15.0)
Immature Granulocytes: 1 %
Lymphocytes Relative: 19 %
Lymphs Abs: 0.9 10*3/uL (ref 0.7–4.0)
MCH: 26.4 pg (ref 26.0–34.0)
MCHC: 30 g/dL (ref 30.0–36.0)
MCV: 88.1 fL (ref 80.0–100.0)
Monocytes Absolute: 0.5 10*3/uL (ref 0.1–1.0)
Monocytes Relative: 9 %
Neutro Abs: 3.4 10*3/uL (ref 1.7–7.7)
Neutrophils Relative %: 68 %
Platelets: 203 10*3/uL (ref 150–400)
RBC: 3.86 MIL/uL — ABNORMAL LOW (ref 3.87–5.11)
RDW: 15.8 % — ABNORMAL HIGH (ref 11.5–15.5)
WBC: 5 10*3/uL (ref 4.0–10.5)
nRBC: 0 % (ref 0.0–0.2)

## 2018-07-24 LAB — URINALYSIS, ROUTINE W REFLEX MICROSCOPIC
Bilirubin Urine: NEGATIVE
Glucose, UA: NEGATIVE mg/dL
Ketones, ur: NEGATIVE mg/dL
Nitrite: NEGATIVE
Protein, ur: NEGATIVE mg/dL
RBC / HPF: >50 RBC/hpf — ABNORMAL HIGH (ref 0–5)
Specific Gravity, Urine: 1.013 (ref 1.005–1.030)
pH: 9 — ABNORMAL HIGH (ref 5.0–8.0)

## 2018-07-24 LAB — WET PREP, GENITAL
Sperm: NONE SEEN
Trich, Wet Prep: NONE SEEN

## 2018-07-24 LAB — PREGNANCY, URINE: Preg Test, Ur: NEGATIVE

## 2018-07-24 LAB — LIPASE, BLOOD: Lipase: 30 U/L (ref 11–51)

## 2018-07-24 MED ORDER — HYDROMORPHONE HCL 1 MG/ML IJ SOLN
0.5000 mg | Freq: Once | INTRAMUSCULAR | Status: AC
  Administered 2018-07-24: 15:00:00 0.5 mg via INTRAVENOUS
  Filled 2018-07-24: qty 1

## 2018-07-24 MED ORDER — MEDROXYPROGESTERONE ACETATE 10 MG PO TABS: 10.0000 mg | ORAL_TABLET | Freq: Four times a day (QID) | ORAL | 0 refills | Status: DC

## 2018-07-24 MED ORDER — IOHEXOL 300 MG/ML  SOLN
100.0000 mL | Freq: Once | INTRAMUSCULAR | Status: AC | PRN
  Administered 2018-07-24: 16:00:00 100 mL via INTRAVENOUS

## 2018-07-24 MED ORDER — HYDROMORPHONE HCL 1 MG/ML IJ SOLN
1.0000 mg | Freq: Once | INTRAMUSCULAR | Status: AC
  Administered 2018-07-24: 1 mg via INTRAVENOUS
  Filled 2018-07-24: qty 1

## 2018-07-24 MED ORDER — METRONIDAZOLE 500 MG PO TABS
500.0000 mg | ORAL_TABLET | Freq: Once | ORAL | Status: AC
  Administered 2018-07-24: 18:00:00 500 mg via ORAL
  Filled 2018-07-24: qty 1

## 2018-07-24 MED ORDER — FLUCONAZOLE 100 MG PO TABS
200.0000 mg | ORAL_TABLET | Freq: Once | ORAL | Status: AC
  Administered 2018-07-24: 18:00:00 200 mg via ORAL
  Filled 2018-07-24: qty 2

## 2018-07-24 NOTE — Discharge Instructions (Addendum)
Please take all of your antibiotics until finished!   You may develop abdominal discomfort or diarrhea from the antibiotic.  You may help offset this with probiotics which you can buy or get in yogurt. Do not eat  or take the probiotics until 2 hours after your antibiotic.  You were also treated for a yeast infection with Diflucan today in the emergency department.  Start taking the medroxyprogesterone every 6 hours as prescribed.  Call the OB/GYN and set up an appointment for soon as possible.  I spoke with Dr. Ilda Basset on the phone and he states that they should be able to get you in for an appointment this week.  It is very important that you follow-up with the OB/GYN as they will be able to better manage your symptoms, control your pain, and make recommendations for what you will need in the future.  You can alternate 400 mg of ibuprofen and [phone] mg of Tylenol every 3 hours as needed for pain. Do not exceed 4000 mg of Tylenol daily.  Take ibuprofen with food to avoid upset stomach issues.  Return to the emergency department if any concerning signs or symptoms develop such as persistent vomiting, high fevers, severe pain.

## 2018-07-24 NOTE — ED Notes (Signed)
In CT

## 2018-07-24 NOTE — ED Triage Notes (Signed)
Pt c/o ongoing vaginal bleeding and pelvic pain since April 13th. Pt denies pregnancy or fibroids. Pt has not seen an OB/GYN for problem. States pain has now moved higher up on abdomen. Pt states that she goes through a bag of pads a day and has to wear diapers.

## 2018-07-24 NOTE — ED Provider Notes (Signed)
[facility] EMERGENCY DEPARTMENT Provider Note   CSN: [id number] Arrival date & time: 07/24/18  1328    History   Chief Complaint Chief Complaint  Patient presents with  . Vaginal Bleeding    HPI Kristi Martin is a 48 y.o. female with history of anemia, anxiety, asthma, cervical cancer, CKD, GERD, migraines, peripheral neuropathy, substance abuse presents for evaluation of ongoing and worsening vaginal bleeding since April 13.  Was seen and evaluated for this on 06/26/2018 with work-up showing thickened endometrial stripe on imaging. She states that she has not been able to follow-up with an OB/GYN yet due to the COVID-19 pandemic.  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  Reports some dyspnea on exertion and generalized weakness/fatigue since her symptoms began.  Notes constant cramping lower abdominal pain that at times radiates upwards.  Denies nausea or vomiting, diarrhea or constipation.  No urinary symptoms.  Chest pain, fevers, or cough.  Non-smoker, no recreational drug use or alcohol intake.  She has been taking ibuprofen at least twice daily without significant relief of her symptoms.     The history is provided by the patient.    Past Medical History:  Diagnosis Date  . Anemia   . Anxiety   . Asthma    as child  . Cancer ([place])    cervical cx at age 57  . Cervical cancer ([place])   . Chronic back pain   . Chronic kidney disease   . CKD (chronic kidney disease), symptom management only, stage 3 (moderate) ([place])   . DDD (degenerative disc disease), lumbar    Facet arthritis  . GERD (gastroesophageal reflux disease)   . Hypotension   . Left knee pain   . Migraine   . Peripheral neuropathy   . Sciatica   . Substance abuse ([place])   . Vaginal Pap smear, abnormal     Patient Active Problem List   Diagnosis Date Noted  .  Substance abuse ([place]) 05/27/2016  . MDD (major depressive disorder) 06/08/2015  . GAD (generalized anxiety disorder) 02/07/2015  . DDD (degenerative disc disease), lumbar 10/10/2014  . Facet arthropathy, lumbar 10/10/2014  . HGSIL (high grade squamous intraepithelial lesion) on Pap smear of cervix 08/04/2014  . Chronic back pain 07/27/2014  . GERD (gastroesophageal reflux disease) 07/27/2014  . Peripheral neuropathy 06/28/2014  . Obesity 06/28/2014  . Glucose intolerance (impaired glucose tolerance) 04/03/2014  . Chronic anemia 04/01/2014  . CKD (chronic kidney disease) stage 3, GFR 30-59 ml/min ([place]) 04/01/2014    Past Surgical History:  Procedure Laterality Date  . CERVICAL CONIZATION W/BX N/A 04/01/2017   Procedure: LASER CONIZATION CERVIX WITH BIOPSY;  Surgeon: Florian Buff, MD;  Location: [facility];  Service: Gynecology;  Laterality: N/A;  . CHOLECYSTECTOMY N/A 01/20/2013   Procedure: LAPAROSCOPIC CHOLECYSTECTOMY WITH INTRAOPERATIVE CHOLANGIOGRAM;  Surgeon: Shann Medal, MD;  Location: [facility];  Service: General;  Laterality: N/A;  . TUBAL LIGATION       OB History    Gravida  9   Para  7   Term  5   Preterm  2   AB  2   Living  7     SAB  2   TAB      Ectopic      Multiple      Live  Births  1            Home Medications    Prior to Admission medications   Medication Sig Start Date End Date Taking? Authorizing Provider  calcium carbonate (TUMS - DOSED IN MG ELEMENTAL CALCIUM) 500 MG chewable tablet Chew 2 tablets by mouth 3 (three) times daily as needed for indigestion or heartburn.   Yes [provider]  cholecalciferol (VITAMIN D) 1000 units tablet Take 1,000 Units by mouth daily.   Yes [provider]  citalopram (CELEXA) 20 MG tablet Take 20 mg by mouth daily.    Yes [provider]  Multiple Vitamin (MULTIVITAMIN WITH MINERALS) TABS tablet Take 1 tablet by mouth daily. WOMEN'S ONE A DAY   Yes [provider]   traZODone (DESYREL) 100 MG tablet Take 300 mg by mouth at bedtime.    Yes [provider]  medroxyPROGESTERone (PROVERA) 10 MG tablet Take 1 tablet (10 mg total) by mouth every 6 (six) hours for 10 days. 07/24/18 08/03/18  Renita Papa, PA-C    Family History Family History  Problem Relation Age of Onset  . Cancer Mother   . Colon cancer Mother   . Breast cancer Maternal Grandmother   . Heart disease Maternal Grandmother   . Cancer Maternal Grandmother   . Breast cancer Paternal Grandmother   . Heart disease Paternal Grandmother   . Hypertension Paternal Grandmother   . Alcohol abuse Father   . Drug abuse Father   . Heart disease Father   . Asthma Brother   . Cancer Paternal Grandfather     Social History Social History   Tobacco Use  . Smoking status: Never Smoker  . Smokeless tobacco: Never Used  Substance Use Topics  . Alcohol use: Yes    Alcohol/week: 0.0 standard drinks    Comment: occasional  . Drug use: Not Currently    Types: "Crack" cocaine     Allergies   Benadryl [diphenhydramine hcl] and Onion   Review of Systems Review of Systems  Constitutional: Positive for fatigue. Negative for chills and fever.  Respiratory: Positive for shortness of breath.   Cardiovascular: Negative for chest pain.  Gastrointestinal: Positive for abdominal pain. Negative for constipation, diarrhea, nausea and vomiting.  Genitourinary: Positive for pelvic pain and vaginal bleeding.  All other systems reviewed and are negative.    Physical Exam Updated Vital Signs BP 121/66 (BP Location: Right Arm)   Pulse 71   Temp 97.8 F (36.6 C) (Oral)   Resp 18   Ht 5\' 6"  (1.676 m)   Wt 121.6 kg   LMP 05/17/2018   SpO2 98%   BMI 43.26 kg/m   Physical Exam Vitals signs and nursing note reviewed.  Constitutional:      General: She is not in acute distress.    Appearance: She is well-developed.  HENT:     Head: Normocephalic and atraumatic.  Eyes:     General:         Right eye: No discharge.        Left eye: No discharge.     Conjunctiva/sclera: Conjunctivae normal.  Neck:     Vascular: No JVD.     Trachea: No tracheal deviation.  Cardiovascular:     Rate and Rhythm: Normal rate and regular rhythm.  Pulmonary:     Effort: Pulmonary effort is normal.     Breath sounds: Normal breath sounds.  Abdominal:     General: A surgical scar is present. There  is no distension.     Palpations: Abdomen is soft.     Tenderness: There is generalized abdominal tenderness and tenderness in the suprapubic area. There is right CVA tenderness and left CVA tenderness. There is no guarding or rebound.     Comments: Maximally tender to palpation in the suprapubic region.  scars from prior cholecystectomy.  Genitourinary:    Comments: Examination performed in the presence of a chaperone.  No masses or lesions to the external genitalia.  Moderate amount of blood in the vaginal vault.  No cervical motion tenderness or adnexal tenderness or mass Musculoskeletal:     Comments: Diffuse paralumbar muscle tenderness but no midline spine tenderness.  No deformity, crepitus, or step-off noted.  Skin:    Findings: No erythema.  Neurological:     Mental Status: She is alert.  Psychiatric:        Behavior: Behavior normal.      ED Treatments / Results  Labs (all labs ordered are listed, but only abnormal results are displayed) Labs Reviewed  WET PREP, GENITAL - Abnormal; Notable for the following components:      Result Value   Yeast Wet Prep HPF POC PRESENT (*)    Clue Cells Wet Prep HPF POC PRESENT (*)    WBC, Wet Prep HPF POC MODERATE (*)    All other components within normal limits  CBC WITH DIFFERENTIAL/PLATELET - Abnormal; Notable for the following components:   RBC 3.86 (*)    Hemoglobin 10.2 (*)    HCT 34.0 (*)    RDW 15.8 (*)    All other components within normal limits  COMPREHENSIVE METABOLIC PANEL - Abnormal; Notable for the following components:    Glucose, Bld 111 (*)    Creatinine, Ser 1.06 (*)    Calcium 7.7 (*)    Total Protein 5.6 (*)    Albumin 2.7 (*)    AST 14 (*)    Total Bilirubin 0.2 (*)    All other components within normal limits  URINALYSIS, ROUTINE W REFLEX MICROSCOPIC - Abnormal; Notable for the following components:   APPearance HAZY (*)    pH 9.0 (*)    Hgb urine dipstick LARGE (*)    Leukocytes,Ua TRACE (*)    RBC / HPF >50 (*)    Bacteria, UA RARE (*)    All other components within normal limits  PREGNANCY, URINE  LIPASE, BLOOD  RPR  HIV ANTIBODY (ROUTINE TESTING W REFLEX)  GC/CHLAMYDIA PROBE AMP () NOT AT [facility]    EKG None  Radiology Ct Abdomen Pelvis W Contrast  Result Date: 07/24/2018 CLINICAL DATA:  Ongoing vaginal bleeding and pelvic pain since April 13. EXAM: CT ABDOMEN AND PELVIS WITH CONTRAST TECHNIQUE: Multidetector CT imaging of the abdomen and pelvis was performed using the standard protocol following bolus administration of intravenous contrast. CONTRAST:  131mL OMNIPAQUE IOHEXOL 300 MG/ML  SOLN COMPARISON:  06/25/2018; 04/07/2017; 12/09/2016; 02/03/2016; pelvic ultrasound-06/27/2018 FINDINGS: Lower chest: Limited visualization of the lower thorax demonstrates minimal dependent subpleural ground-glass atelectasis. No pleural effusion. Borderline cardiomegaly.  No pericardial effusion. Hepatobiliary: Normal hepatic contour. No discrete hepatic lesions. Post cholecystectomy. No ascites. Pancreas: Normal appearance of the pancreas. Spleen: Normal appearance of the spleen. Adrenals/Urinary Tract: There is symmetric enhancement and excretion of the bilateral kidneys. No definite renal stones this postcontrast examination. No discrete renal lesions. No urine obstruction or perinephric stranding. Normal appearance the bilateral adrenal glands. Normal appearance of the urinary bladder given underdistention. Stomach/Bowel: Moderate stool burden without  evidence of enteric obstruction. Normal  appearance of the terminal ileum and the appendix. No discrete areas of bowel wall thickening. No pneumoperitoneum, pneumatosis or portal venous gas. Vascular/Lymphatic: Normal caliber of the abdominal aorta. The branch vessels of the abdominal aorta appear patent on this non CTA examination. The left common iliac artery is noted to be slightly atretic in comparison to the right with the left measuring 0.9 cm and the right measuring 1.2 cm (image 52, series 2). No bulky retroperitoneal, mesenteric, pelvic or inguinal lymphadenopathy. Reproductive: Normal appearance of the pelvic organs. The uterus is retroverted. No discrete adnexal lesion. There is a small amount of fluid seen with the pelvic cul-de-sac, presumably physiologic. Other: Minimal amount of body wall anasarca presumably secondary to patient body habitus. Musculoskeletal: No acute or aggressive osseous abnormalities. Mild degenerative change of the pubic symphysis. IMPRESSION: 1. No definite explanation patient's abdominal and pelvic pain. Specifically, no discrete uterine or adnexal lesion. Note, the endometrium was noted to be thickened on pelvic ultrasound performed 06/27/2018, at which time gyn referral was advised. 2. Small amount of fluid in pelvic cul-de-sac, presumably physiologic. 3. Moderate colonic stool burden without evidence of enteric obstruction. Normal appearance of the appendix. 4. Post cholecystectomy. Electronically Signed   By: Sandi Mariscal M.D.   On: 07/24/2018 15:48    Procedures Procedures (including critical care time)  Medications Ordered in ED Medications  HYDROmorphone (DILAUDID) injection 0.5 mg (0.5 mg Intravenous Given 07/24/18 1507)  iohexol (OMNIPAQUE) 300 MG/ML solution 100 mL (100 mLs Intravenous Contrast Given 07/24/18 1534)  HYDROmorphone (DILAUDID) injection 1 mg (1 mg Intravenous Given 07/24/18 1720)     Initial Impression / Assessment and Plan / ED Course  I have reviewed the triage vital signs and the  nursing notes.  Pertinent labs & imaging results that were available during my care of the patient were reviewed by me and considered in my medical decision making (see chart for details).        Patient presenting with ongoing vaginal bleeding.  She is afebrile, vital signs are stable in the ED.  She is uncomfortable but nontoxic in appearance.  No peritoneal signs on examination of the abdomen and examination is nonfocal.  Lab work reviewed by me shows no leukocytosis.  She is anemic with a 2 g drop in her hemoglobin in the last 3 weeks but she is hemodynamically stable and I see no indication for emergent blood transfusion.  Remainder of blood work shows mildly elevated creatinine which appears to be at patient's baseline.  UA does not suggest UTI.  Wet prep shows evidence of yeast and BV so we will treat for both.  CT of the abdomen and pelvis shows no definite explanation of the patient's abdominal and pelvic pain.  She does have a small amount of fluid in the pelvic cul-de-sac.  No evidence of acute surgical abdominal pathology including obstruction, perforation, appendicitis, cholecystitis, dissection, PID, TOA, ovarian torsion.  Pain managed while in the ED. 4:57 PM Spoke with Dr. Ilda Basset with OB/GYN in consultation.  He recommends discharging the patient on Provera 10mg  q6hr x10 days and strongly encourages the patient call to set up a follow-up appointment.  On reevaluation patient resting comfortably, tolerating p.o. food and fluids without difficulty.  Serial abdominal examinations remain benign.  She understands to follow-up with OB/GYN this week.  Discussed strict ED return precautions. Patient verbalized understanding of and agreement with plan and is safe for discharge home at this time.   Final  Clinical Impressions(s) / ED Diagnoses   Final diagnoses:  Abnormal uterine bleeding (AUB)    ED Discharge Orders         Ordered    medroxyPROGESTERone (PROVERA) 10 MG tablet  Every 6  hours     07/24/18 1656           Renita Papa, PA-C 07/24/18 1728    Milton Ferguson, MD 07/26/18 [phone]

## 2018-07-25 LAB — HIV ANTIBODY (ROUTINE TESTING W REFLEX): HIV Screen 4th Generation wRfx: NONREACTIVE

## 2018-07-25 LAB — RPR: RPR Ser Ql: NONREACTIVE

## 2018-07-26 ENCOUNTER — Emergency Department (HOSPITAL_COMMUNITY)
Admission: EM | Admit: 2018-07-26 | Discharge: 2018-07-26 | Discharge status: Against Medical Advice | Payer: Self-pay | Attending: Emergency Medicine | Admitting: Emergency Medicine

## 2018-07-26 ENCOUNTER — Other Ambulatory Visit: Payer: Self-pay

## 2018-07-26 DIAGNOSIS — E860 Dehydration: Secondary | ICD-10-CM | POA: Insufficient documentation

## 2018-07-26 DIAGNOSIS — N183 Chronic kidney disease, stage 3 (moderate): Secondary | ICD-10-CM | POA: Insufficient documentation

## 2018-07-26 DIAGNOSIS — Z79899 Other long term (current) drug therapy: Secondary | ICD-10-CM | POA: Insufficient documentation

## 2018-07-26 LAB — GC/CHLAMYDIA PROBE AMP (~~LOC~~) NOT AT ARMC
Chlamydia: NEGATIVE
Neisseria Gonorrhea: NEGATIVE

## 2018-07-26 MED ORDER — SODIUM CHLORIDE 0.9 % IV BOLUS
2000.0000 mL | Freq: Once | INTRAVENOUS | Status: AC
  Administered 2018-07-26: 2000 mL via INTRAVENOUS

## 2018-07-26 NOTE — ED Triage Notes (Signed)
Pt had walked a mile on side of road in the heat and had a near syncopal episode.  Pt with pain to upper abdomen.  Pt states her tire went flat on way to store.

## 2018-07-26 NOTE — ED Provider Notes (Signed)
[facility] EMERGENCY DEPARTMENT Provider Note   CSN: [id number] Arrival date & time: 07/26/18  1540     History   Chief Complaint Chief Complaint  Patient presents with  . Near Syncope  . Hypotension    in triage    HPI KINYA MEINE is a 48 y.o. female.     Patient complains of feeling weak and dizzy.  She walked 1 mile in the heat.  No other problem  The history is provided by the patient. No language interpreter was used.  Near Syncope This is a new problem. The current episode started less than 1 hour ago. The problem occurs rarely. The problem has not changed since onset.Pertinent negatives include no chest pain, no abdominal pain and no headaches. Nothing aggravates the symptoms. Nothing relieves the symptoms. She has tried nothing for the symptoms. The treatment provided no relief.    Past Medical History:  Diagnosis Date  . Anemia   . Anxiety   . Asthma    as child  . Cancer ([place])    cervical cx at age 29  . Cervical cancer ([place])   . Chronic back pain   . Chronic kidney disease   . CKD (chronic kidney disease), symptom management only, stage 3 (moderate) ([place])   . DDD (degenerative disc disease), lumbar    Facet arthritis  . GERD (gastroesophageal reflux disease)   . Hypotension   . Left knee pain   . Migraine   . Peripheral neuropathy   . Sciatica   . Substance abuse ([place])   . Vaginal Pap smear, abnormal     Patient Active Problem List   Diagnosis Date Noted  . Substance abuse ([place]) 05/27/2016  . MDD (major depressive disorder) 06/08/2015  . GAD (generalized anxiety disorder) 02/07/2015  . DDD (degenerative disc disease), lumbar 10/10/2014  . Facet arthropathy, lumbar 10/10/2014  . HGSIL (high grade squamous intraepithelial lesion) on Pap smear of cervix 08/04/2014  . Chronic back pain 07/27/2014  . GERD (gastroesophageal reflux disease) 07/27/2014  . Peripheral neuropathy 06/28/2014  . Obesity 06/28/2014  . Glucose intolerance (impaired  glucose tolerance) 04/03/2014  . Chronic anemia 04/01/2014  . CKD (chronic kidney disease) stage 3, GFR 30-59 ml/min ([place]) 04/01/2014    Past Surgical History:  Procedure Laterality Date  . CERVICAL CONIZATION W/BX N/A 04/01/2017   Procedure: LASER CONIZATION CERVIX WITH BIOPSY;  Surgeon: Florian Buff, MD;  Location: [facility];  Service: Gynecology;  Laterality: N/A;  . CHOLECYSTECTOMY N/A 01/20/2013   Procedure: LAPAROSCOPIC CHOLECYSTECTOMY WITH INTRAOPERATIVE CHOLANGIOGRAM;  Surgeon: Shann Medal, MD;  Location: [facility];  Service: General;  Laterality: N/A;  . TUBAL LIGATION       OB History    Gravida  9   Para  7   Term  5   Preterm  2   AB  2   Living  7     SAB  2   TAB      Ectopic      Multiple      Live Births  1            Home Medications    Prior to Admission medications   Medication Sig Start Date End Date Taking? Authorizing Provider  calcium carbonate (TUMS - DOSED IN MG ELEMENTAL CALCIUM) 500 MG chewable tablet Chew 2 tablets by mouth 3 (three) times daily as needed for indigestion or heartburn.    [provider]  cholecalciferol (VITAMIN D) 1000 units  tablet Take 1,000 Units by mouth daily.    [provider]  citalopram (CELEXA) 20 MG tablet Take 20 mg by mouth daily.     [provider]  medroxyPROGESTERone (PROVERA) 10 MG tablet Take 1 tablet (10 mg total) by mouth every 6 (six) hours for 10 days. 07/24/18 08/03/18  Rodell Perna A, PA-C  Multiple Vitamin (MULTIVITAMIN WITH MINERALS) TABS tablet Take 1 tablet by mouth daily. [place] DAY    [provider]  traZODone (DESYREL) 100 MG tablet Take 300 mg by mouth at bedtime.     [provider]    Family History Family History  Problem Relation Age of Onset  . Cancer Mother   . Colon cancer Mother   . Breast cancer Maternal Grandmother   . Heart disease Maternal Grandmother   . Cancer Maternal Grandmother   . Breast cancer Paternal  Grandmother   . Heart disease Paternal Grandmother   . Hypertension Paternal Grandmother   . Alcohol abuse Father   . Drug abuse Father   . Heart disease Father   . Asthma Brother   . Cancer Paternal Grandfather     Social History Social History   Tobacco Use  . Smoking status: Never Smoker  . Smokeless tobacco: Never Used  Substance Use Topics  . Alcohol use: Yes    Alcohol/week: 0.0 standard drinks    Comment: occasional  . Drug use: Not Currently    Types: "Crack" cocaine     Allergies   Benadryl [diphenhydramine hcl] and Onion   Review of Systems Review of Systems  Constitutional: Negative for appetite change and fatigue.  HENT: Negative for congestion, ear discharge and sinus pressure.   Eyes: Negative for discharge.  Respiratory: Negative for cough.   Cardiovascular: Positive for near-syncope. Negative for chest pain.  Gastrointestinal: Negative for abdominal pain and diarrhea.  Genitourinary: Negative for frequency and hematuria.  Musculoskeletal: Negative for back pain.  Skin: Negative for rash.  Neurological: Negative for seizures and headaches.  Psychiatric/Behavioral: Negative for hallucinations.     Physical Exam Updated Vital Signs BP (!) 70/57 (BP Location: Right Arm)   Pulse 91   Temp 98 F (36.7 C) (Oral)   Resp 18   Ht 5\' 6"  (1.676 m)   Wt 122.5 kg   LMP 05/18/2018 Comment: continued vaginal bleeding  SpO2 98%   BMI 43.58 kg/m   Physical Exam Vitals signs and nursing note reviewed.  Constitutional:      Appearance: She is well-developed.  HENT:     Head: Normocephalic.     Mouth/Throat:     Mouth: Mucous membranes are moist.  Eyes:     General: No scleral icterus.    Conjunctiva/sclera: Conjunctivae normal.  Neck:     Musculoskeletal: Neck supple.     Thyroid: No thyromegaly.  Cardiovascular:     Rate and Rhythm: Normal rate and regular rhythm.     Heart sounds: No murmur. No friction rub. No gallop.   Pulmonary:     Breath  sounds: No stridor. No wheezing or rales.  Chest:     Chest wall: No tenderness.  Abdominal:     General: There is no distension.     Tenderness: There is no abdominal tenderness. There is no rebound.  Musculoskeletal: Normal range of motion.  Lymphadenopathy:     Cervical: No cervical adenopathy.  Skin:    Findings: No erythema or rash.  Neurological:     Mental Status: She is  oriented to person, place, and time.     Motor: No abnormal muscle tone.     Coordination: Coordination normal.  Psychiatric:        Behavior: Behavior normal.      ED Treatments / Results  Labs (all labs ordered are listed, but only abnormal results are displayed) Labs Reviewed  CBC WITH DIFFERENTIAL/PLATELET    EKG    Radiology No results found.  Procedures Procedures (including critical care time)  Medications Ordered in ED Medications  sodium chloride 0.9 % bolus 2,000 mL (0 mLs Intravenous Stopped 07/26/18 1621)     Initial Impression / Assessment and Plan / ED Course  I have reviewed the triage vital signs and the nursing notes.  Pertinent labs & imaging results that were available during my care of the patient were reviewed by me and considered in my medical decision making (see chart for details).        Patient with dehydration.  IV fluids were ordered but she left AMA  Final Clinical Impressions(s) / ED Diagnoses   Final diagnoses:  Dehydration    ED Discharge Orders    None       Milton Ferguson, MD 07/26/18 1644

## 2018-07-26 NOTE — ED Notes (Signed)
Pt stated that she needs to leave and can't not stay her car on the side of the highway, pt verbalized understanding of leaving ama iv was d/c'd and she was stable on her feet gait stable and she was AOx4

## 2018-08-05 ENCOUNTER — Encounter (HOSPITAL_COMMUNITY): Payer: Self-pay | Admitting: Emergency Medicine

## 2018-08-05 ENCOUNTER — Emergency Department (HOSPITAL_COMMUNITY): Payer: Self-pay

## 2018-08-05 ENCOUNTER — Other Ambulatory Visit: Payer: Self-pay

## 2018-08-05 ENCOUNTER — Emergency Department (HOSPITAL_COMMUNITY)
Admission: EM | Admit: 2018-08-05 | Discharge: 2018-08-05 | Disposition: A | Discharge status: Home / Self Care | Payer: Self-pay | Attending: Emergency Medicine | Admitting: Emergency Medicine

## 2018-08-05 DIAGNOSIS — Z8541 Personal history of malignant neoplasm of cervix uteri: Secondary | ICD-10-CM | POA: Insufficient documentation

## 2018-08-05 DIAGNOSIS — Z79899 Other long term (current) drug therapy: Secondary | ICD-10-CM | POA: Insufficient documentation

## 2018-08-05 DIAGNOSIS — R0789 Other chest pain: Secondary | ICD-10-CM | POA: Insufficient documentation

## 2018-08-05 DIAGNOSIS — J45909 Unspecified asthma, uncomplicated: Secondary | ICD-10-CM | POA: Insufficient documentation

## 2018-08-05 DIAGNOSIS — R55 Syncope and collapse: Secondary | ICD-10-CM | POA: Insufficient documentation

## 2018-08-05 DIAGNOSIS — N183 Chronic kidney disease, stage 3 (moderate): Secondary | ICD-10-CM | POA: Insufficient documentation

## 2018-08-05 LAB — CBC
HCT: 34.7 % — ABNORMAL LOW (ref 36.0–46.0)
Hemoglobin: 10.8 g/dL — ABNORMAL LOW (ref 12.0–15.0)
MCH: 27 pg (ref 26.0–34.0)
MCHC: 31.1 g/dL (ref 30.0–36.0)
MCV: 86.8 fL (ref 80.0–100.0)
Platelets: 290 10*3/uL (ref 150–400)
RBC: 4 MIL/uL (ref 3.87–5.11)
RDW: 15.9 % — ABNORMAL HIGH (ref 11.5–15.5)
WBC: 6.4 10*3/uL (ref 4.0–10.5)
nRBC: 0 % (ref 0.0–0.2)

## 2018-08-05 LAB — BASIC METABOLIC PANEL
Anion gap: 9 (ref 5–15)
BUN: 10 mg/dL (ref 6–20)
CO2: 24 mmol/L (ref 22–32)
Calcium: 7.9 mg/dL — ABNORMAL LOW (ref 8.9–10.3)
Chloride: 107 mmol/L (ref 98–111)
Creatinine, Ser: 1.1 mg/dL — ABNORMAL HIGH (ref 0.44–1.00)
GFR calc Af Amer: >60 mL/min (ref >60)
GFR calc non Af Amer: 59 mL/min — ABNORMAL LOW (ref >60)
Glucose, Bld: 109 mg/dL — ABNORMAL HIGH (ref 70–99)
Potassium: 3.5 mmol/L (ref 3.5–5.1)
Sodium: 140 mmol/L (ref 135–145)

## 2018-08-05 LAB — POC URINE PREG, ED: Preg Test, Ur: NEGATIVE

## 2018-08-05 LAB — D-DIMER, QUANTITATIVE: D-Dimer, Quant: <0.27 ug/mL-FEU (ref 0.00–0.50)

## 2018-08-05 MED ORDER — FAMOTIDINE 20 MG PO TABS
20.0000 mg | ORAL_TABLET | Freq: Once | ORAL | Status: AC
  Administered 2018-08-05: 20 mg via ORAL
  Filled 2018-08-05: qty 1

## 2018-08-05 MED ORDER — MECLIZINE HCL 12.5 MG PO TABS
25.0000 mg | ORAL_TABLET | Freq: Once | ORAL | Status: AC
  Administered 2018-08-05: 25 mg via ORAL
  Filled 2018-08-05: qty 2

## 2018-08-05 MED ORDER — MECLIZINE HCL 25 MG PO TABS: 25.0000 mg | ORAL_TABLET | Freq: Three times a day (TID) | ORAL | 0 refills | Status: DC | PRN

## 2018-08-05 MED ORDER — SODIUM CHLORIDE 0.9 % IV BOLUS
500.0000 mL | Freq: Once | INTRAVENOUS | Status: AC
  Administered 2018-08-05: 500 mL via INTRAVENOUS

## 2018-08-05 MED ORDER — SODIUM CHLORIDE 0.9 % IV BOLUS
1000.0000 mL | Freq: Once | INTRAVENOUS | Status: AC
  Administered 2018-08-05: 1000 mL via INTRAVENOUS

## 2018-08-05 NOTE — ED Notes (Signed)
Patient stated that upon sitting she was "very dizzy and very weak".   Uncomfortable with allowing patient to stand as she kept saying "so dizzy".

## 2018-08-05 NOTE — ED Notes (Addendum)
Pt ambulated to BR Pt reports that she is feeling better. Dizziness has decreased

## 2018-08-05 NOTE — ED Provider Notes (Signed)
  Face-to-face evaluation   History: She complains of ongoing sensation of lightheadedness with feeling of near syncope and has had several episodes of syncope within the last 24 hours.  She also has been drinking a lot of fluids, "pieces of water," and urinating a lot.  She denies dysuria, fever, chills, cough or shortness of breath.    Physical exam: Obese female alert and cooperative.  Sclera nonicteric.  Conjunctiva not injected.  Mouth with dry mucous membranes.  Heart regular rate and rhythm without murmur lungs clear.  No dysarthria or aphasia.  Patient will sit up, but became near syncopal so laid back down.  Medical screening examination/treatment/procedure(s) were conducted as a shared visit with non-physician practitioner(s) and myself.  I personally evaluated the patient during the encounter   Daleen Bo, MD 08/05/18 2326

## 2018-08-05 NOTE — Discharge Instructions (Signed)
You were seen in the emergency department today for multiple episodes of passing out.  Your rib/chest x-ray did not show any fractures or problems with your lungs. Your head CT did not show any new abnormalities they. Your labs show that your hemoglobin is improving, it is 10.8 today, it was 10.1 at your last visit. Your labs also show that your calcium is a bit low at 7.9, please be sure to include

## 2018-08-05 NOTE — ED Triage Notes (Signed)
Patient states she had syncopal episode last night and twice today. Complaining of headache.

## 2018-08-05 NOTE — ED Notes (Signed)
Patient transported to CT 

## 2018-08-05 NOTE — ED Provider Notes (Signed)
[facility] EMERGENCY DEPARTMENT Provider Note   CSN: [id number] Arrival date & time: 08/05/18  1616     History   Chief Complaint Chief Complaint  Patient presents with  . Loss of Consciousness    HPI Kristi Martin is a 48 y.o. female with a hx of anemia, anxiety, childhood asthma, GERD, CKD stage III, DDD, obesity, & substance abuse w/ prior tubal ligation who presents to the ED w/ complaints of multiple syncope episodes since last night. Patient states that she has had 3 episodes (1 last evening, 2 today) during which she has been upright ambulating or in the shower, becomes lightheaded, dizzy like the room spinning, nauseated, sweaty, short of breath & passes out. She states that each episode of LOC has been brief, only a few seconds. She thinks she may have hit her head & injured her left ribs, but is not entirely sure. She has been having a headache- temporal/frontal which was occurring prior to syncope, gradual onset steady progression, similar to her prior migraines, but these associated sxs are not similar to her migraines. She states her overall pain is a 10/10. She reports that the lightheadedness & dizziness does occur outside of syncope episodes especially with quick head movements or position changes. She also is short of breath sometimes with ambulation. She feels generally weak. Feels she has been eating/drinking okay. Did recently start provera for vaginal bleeding, no other new meds or dosing changes. Denies hx of prior syncopal issues. Denies fever, chills, emesis, diarrhea, abdominal pain, numbness, focal weakness, chest pain, palpitations, unilateral leg pain/swelling, hemoptysis, recent surgery/trauma, recent long travel, estrogen use, or hx of DVT/PE. Had cervical cancer s/p excision @ younger age, no hx of chemo/radiation, no active tx in past 6 months. No family hx of sudden cardiac death.        HPI  Past Medical History:  Diagnosis Date  . Anemia   . Anxiety    . Asthma    as child  . Cancer ([place])    cervical cx at age 9  . Cervical cancer ([place])   . Chronic back pain   . Chronic kidney disease   . CKD (chronic kidney disease), symptom management only, stage 3 (moderate) ([place])   . DDD (degenerative disc disease), lumbar    Facet arthritis  . GERD (gastroesophageal reflux disease)   . Hypotension   . Left knee pain   . Migraine   . Peripheral neuropathy   . Sciatica   . Substance abuse ([place])   . Vaginal Pap smear, abnormal     Patient Active Problem List   Diagnosis Date Noted  . Substance abuse ([place]) 05/27/2016  . MDD (major depressive disorder) 06/08/2015  . GAD (generalized anxiety disorder) 02/07/2015  . DDD (degenerative disc disease), lumbar 10/10/2014  . Facet arthropathy, lumbar 10/10/2014  . HGSIL (high grade squamous intraepithelial lesion) on Pap smear of cervix 08/04/2014  . Chronic back pain 07/27/2014  . GERD (gastroesophageal reflux disease) 07/27/2014  . Peripheral neuropathy 06/28/2014  . Obesity 06/28/2014  . Glucose intolerance (impaired glucose tolerance) 04/03/2014  . Chronic anemia 04/01/2014  . CKD (chronic kidney disease) stage 3, GFR 30-59 ml/min ([place]) 04/01/2014    Past Surgical History:  Procedure Laterality Date  . CERVICAL CONIZATION W/BX N/A 04/01/2017   Procedure: LASER CONIZATION CERVIX WITH BIOPSY;  Surgeon: Florian Buff, MD;  Location: [facility];  Service: Gynecology;  Laterality: N/A;  . CHOLECYSTECTOMY N/A 01/20/2013   Procedure: LAPAROSCOPIC CHOLECYSTECTOMY WITH  INTRAOPERATIVE CHOLANGIOGRAM;  Surgeon: Shann Medal, MD;  Location: [facility];  Service: General;  Laterality: N/A;  . TUBAL LIGATION       OB History    Gravida  9   Para  7   Term  5   Preterm  2   AB  2   Living  7     SAB  2   TAB      Ectopic      Multiple      Live Births  1            Home Medications    Prior to Admission medications   Medication Sig Start Date End Date Taking? Authorizing  Provider  calcium carbonate (TUMS - DOSED IN MG ELEMENTAL CALCIUM) 500 MG chewable tablet Chew 2 tablets by mouth 3 (three) times daily as needed for indigestion or heartburn.    [provider]  cholecalciferol (VITAMIN D) 1000 units tablet Take 1,000 Units by mouth daily.    [provider]  citalopram (CELEXA) 20 MG tablet Take 20 mg by mouth daily.     [provider]  medroxyPROGESTERone (PROVERA) 10 MG tablet Take 1 tablet (10 mg total) by mouth every 6 (six) hours for 10 days. 07/24/18 08/03/18  Rodell Perna A, PA-C  Multiple Vitamin (MULTIVITAMIN WITH MINERALS) TABS tablet Take 1 tablet by mouth daily. [facility] DAY    [provider]  traZODone (DESYREL) 100 MG tablet Take 300 mg by mouth at bedtime.     [provider]    Family History Family History  Problem Relation Age of Onset  . Cancer Mother   . Colon cancer Mother   . Breast cancer Maternal Grandmother   . Heart disease Maternal Grandmother   . Cancer Maternal Grandmother   . Breast cancer Paternal Grandmother   . Heart disease Paternal Grandmother   . Hypertension Paternal Grandmother   . Alcohol abuse Father   . Drug abuse Father   . Heart disease Father   . Asthma Brother   . Cancer Paternal Grandfather     Social History Social History   Tobacco Use  . Smoking status: Never Smoker  . Smokeless tobacco: Never Used  Substance Use Topics  . Alcohol use: Yes    Alcohol/week: 0.0 standard drinks    Comment: occasional  . Drug use: Not Currently    Types: "Crack" cocaine     Allergies   Benadryl [diphenhydramine hcl] and Onion   Review of Systems Review of Systems  Constitutional: Positive for diaphoresis. Negative for chills and fever.  Respiratory: Positive for shortness of breath. Negative for cough.   Cardiovascular: Negative for chest pain, palpitations and leg swelling.  Gastrointestinal: Positive for nausea. Negative for abdominal pain,  constipation, diarrhea and vomiting.  Genitourinary: Negative for dysuria.  Musculoskeletal:       + for rib pain.   Neurological: Positive for dizziness, syncope, weakness (generalized, non focal), light-headedness and headaches. Negative for tremors, seizures, facial asymmetry, speech difficulty and numbness.  All other systems reviewed and are negative.  Physical Exam Updated Vital Signs BP 110/69 (BP Location: Left Arm)   Pulse 87   Temp 98.2 F (36.8 C) (Oral)   Resp 20   Ht 5\' 6"  (1.676 m)   Wt 123.8 kg   LMP 08/01/2018   SpO2 98%   BMI 44.06 kg/m   Physical Exam Vitals signs and nursing note reviewed.  Constitutional:  General: She is not in acute distress.    Appearance: She is well-developed. She is not toxic-appearing.  HENT:     Head: Normocephalic and atraumatic.     Comments: No racoon eyes/battle sign.     Nose: Nose normal.  Eyes:     General:        Right eye: No discharge.        Left eye: No discharge.     Extraocular Movements: Extraocular movements intact.     Conjunctiva/sclera: Conjunctivae normal.     Pupils: Pupils are equal, round, and reactive to light.     Comments: No rotational/vertical nystagmus.   Neck:     Musculoskeletal: Neck supple.     Comments: No midline tenderness.  Cardiovascular:     Rate and Rhythm: Normal rate and regular rhythm.     Heart sounds: No murmur.  Pulmonary:     Effort: Pulmonary effort is normal. No respiratory distress.     Breath sounds: Normal breath sounds. No wheezing, rhonchi or rales.  Chest:     Chest wall: Tenderness (left anterior/lateral/posterior chest wall w/o overlying skin changes or palpable crepitus) present.  Abdominal:     General: There is no distension.     Palpations: Abdomen is soft.     Tenderness: There is no abdominal tenderness. There is no guarding or rebound.  Musculoskeletal:     Right lower leg: No edema.     Left lower leg: No edema.     Comments: No midline spinal  tenderness. Intact AROM & no focal bony tenderness to upper/lower extremities.   Skin:    General: Skin is warm and dry.     Findings: No rash.  Neurological:     Mental Status: She is alert.     Comments: Alert. Clear speech. No facial droop. CNIII-XII grossly intact. Bilateral upper and lower extremities' sensation grossly intact. 5/5 symmetric strength with grip strength and with plantar and dorsi flexion bilaterally. Normal finger to nose bilaterally. Negative pronator drift.    Psychiatric:        Mood and Affect: Mood is anxious (mild).    ED Treatments / Results  Labs (all labs ordered are listed, but only abnormal results are displayed) Labs Reviewed  CBC - Abnormal; Notable for the following components:      Result Value   Hemoglobin 10.8 (*)    HCT 34.7 (*)    RDW 15.9 (*)    All other components within normal limits  BASIC METABOLIC PANEL - Abnormal; Notable for the following components:   Glucose, Bld 109 (*)    Creatinine, Ser 1.10 (*)    Calcium 7.9 (*)    GFR calc non Af Amer 59 (*)    All other components within normal limits  D-DIMER, QUANTITATIVE (NOT AT [facility])  POC URINE PREG, ED  POC URINE PREG, ED    EKG EKG Interpretation  Date/Time:  Thursday August 05 2018 17:46:10 EDT Ventricular Rate:  83 PR Interval:    QRS Duration: 74 QT Interval:  403 QTC Calculation: 474 R Axis:   51 Text Interpretation:  Sinus rhythm Abnormal R-wave progression, early transition Minimal ST elevation, inferior leads since last tracing no significant change Confirmed by Daleen Bo [phone]) on 08/05/2018 5:50:55 PM   Radiology Dg Ribs Unilateral W/chest Left  Result Date: 08/05/2018 CLINICAL DATA:  Pain EXAM: LEFT RIBS AND CHEST - 3+ VIEW COMPARISON:  Chest x-ray dated April 18, 2018 FINDINGS: No fracture or other  bone lesions are seen involving the ribs. There is no evidence of pneumothorax or pleural effusion. Both lungs are clear. Heart size and mediastinal contours are  within normal limits. IMPRESSION: Negative. Electronically Signed   By: Constance Holster M.D.   On: 08/05/2018 19:11   Ct Head Wo Contrast  Result Date: 08/05/2018 CLINICAL DATA:  48 year old female with headache, syncopal episodes yesterday and today. EXAM: CT HEAD WITHOUT CONTRAST TECHNIQUE: Contiguous axial images were obtained from the base of the skull through the vertex without intravenous contrast. COMPARISON:  Head CT 09/25/2017 and earlier. FINDINGS: Brain: Normal cerebral volume. No midline shift, ventriculomegaly, mass effect, evidence of mass lesion, intracranial hemorrhage or evidence of cortically based acute infarction. Gray-white matter differentiation is within normal limits throughout the brain. Chronic partially empty sella. Vascular: No suspicious intracranial vascular hyperdensity. Skull: Negative. Sinuses/Orbits: Visualized paranasal sinuses and mastoids are stable and well pneumatized. Other: Visualized orbits and scalp soft tissues are within normal limits. IMPRESSION: Stable noncontrast CT appearance of the brain, normal aside from chronic partially empty sella which can be a normal anatomic variant but also can be associated with idiopathic intracranial hypertension (pseudotumor cerebri) . Electronically Signed   By: Genevie Ann M.D.   On: 08/05/2018 19:04    Procedures Procedures (including critical care time)  Medications Ordered in ED Medications - No data to display   Initial Impression / Assessment and Plan / ED Course  I have reviewed the triage vital signs and the nursing notes.  Pertinent labs & imaging results that were available during my care of the patient were reviewed by me and considered in my medical decision making (see chart for details).   Patient presents to the ED w/ multiple syncopal episodes in the past 24 hours. She is nontoxic appearing, no apparent distress, vitals WNL but notable for soft pressures. Exam w/o focal neuro deficits, did not  stand/ambulate patient secondary to symptoms. Heart RRR w/o murmurs, sinus rhythm on monitor. She has some L chest wall tenderness without overlying skin changes or palpable crepitus. No midline spinal tenderness. Plan for orthostatic vitals, labs, & cardiac monitoring.   Orthostatic vitals- Per RN patient unable to tolerate standing vitals secondary to lightheadedness/dizziness.  CBC: Anemia consistent w/ prior. No leukocytosis.  BMP: Glucose 109. Creatinine @ baseline. Hypocalcemia @ 7.9, electrolytes otherwise WNL.  Preg test: Negative UA: unable to analyze secondary to stool in urine cup x 2, patient would like to leave ER therefore this was discontinued low suspicion for UTI or other concerning urinary findings.  EKG x 2 with no significant change from prior.  Cardiac monitor reviewed no concerning arrhythmias.  D-dimer WNL- low risk wells, doubt PE Chest/rib x-ray w/o fx/dislocation or underlying cardiopulmonary pathology.  CT head with stable appearance.   20:18: On reassessment of the patient she is feeling much better, she states her symptoms are completely resolved, she has been ambulatory without difficulty throughout the emergency department & is tolerating PO. Patient is requesting discharge home. She remains without focal neuro deficits. Reassuring work-up in the ER- EKG/cardiac monitor w/o concerning findings, no underlying significant anemia/electrolyte abnormality, d-dimer WNL- doubt PE, imaging without acute abnormality, w/ sxs resolved & normal neuro exam doubt stroke/bleed/SAH. Overall suspect components of orthostatic hypotension & peripheral vertigo but unclear definitive etiology. Will prescribe meclizine to take PRN, encouraged fluids, PCP follow up. I discussed results, treatment plan, need for follow-up, and return precautions with the patient. Provided opportunity for questions, patient confirmed understanding and is in agreement  with plan.    Findings and plan of care  discussed with supervising physician Dr. Eulis Foster who personally evaluated patient & is in agreement.   Final Clinical Impressions(s) / ED Diagnoses   Final diagnoses:  Syncope, unspecified syncope type    ED Discharge Orders         Ordered    meclizine (ANTIVERT) 25 MG tablet  3 times daily PRN     08/05/18 2018           Lorilyn Laitinen, Glynda Jaeger, PA-C 08/05/18 2028    Daleen Bo, MD 08/05/18 2326

## 2018-08-25 ENCOUNTER — Ambulatory Visit: Payer: Self-pay | Admitting: Physician Assistant

## 2018-08-25 ENCOUNTER — Encounter: Payer: Self-pay | Admitting: Physician Assistant

## 2018-08-25 DIAGNOSIS — D649 Anemia, unspecified: Secondary | ICD-10-CM

## 2018-08-25 DIAGNOSIS — N938 Other specified abnormal uterine and vaginal bleeding: Secondary | ICD-10-CM

## 2018-08-25 DIAGNOSIS — F39 Unspecified mood [affective] disorder: Secondary | ICD-10-CM

## 2018-08-25 DIAGNOSIS — R9389 Abnormal findings on diagnostic imaging of other specified body structures: Secondary | ICD-10-CM

## 2018-08-25 DIAGNOSIS — N189 Chronic kidney disease, unspecified: Secondary | ICD-10-CM

## 2018-08-25 DIAGNOSIS — Z1239 Encounter for other screening for malignant neoplasm of breast: Secondary | ICD-10-CM

## 2018-08-25 NOTE — Progress Notes (Signed)
LMP 08/01/2018 Comment: tubal ligation   Subjective:    Patient ID: Kristi Martin, female    DOB: 07/19/1970, 48 y.o.   MRN: [id number]  HPI: Kristi Martin is a 48 y.o. female presenting on 08/25/2018 for Follow-up   HPI   This is a telemedicine appointment due to coronavirus pandemic.  It is via Telephone as pt says she has bad signal today  I connected with  RASHEEMA TRULUCK on 08/25/18 by a video enabled telemedicine application and verified that I am speaking with the correct person using two identifiers.   I discussed the limitations of evaluation and management by telemedicine. The patient expressed understanding and agreed to proceed.  Pt says she is at home.  Provider is at office    Pt  is still going to [facility] for mental health care  Pt says she has not applied for medicaid  She has been to ER many times and told to follow up with gyn but she hasn't called them yet due to she doesn't have money.   Pt says she was approved for [facility] [employer] [facility] in the past (for surgery or somethings) but it is expired  Pt says she has had vaginal bleeding for several months.  She says it Started in April.   She says her menses were normal (but heavy) prior to that time.  pt has had [place] and CT done in ER.   The US showed endometrial thickening.  She says the ER started her on provera but she says it hasn't helped much.    Relevant past medical, surgical, family and social history reviewed and updated as indicated. Interim medical history since our last visit reviewed. Allergies and medications reviewed and updated.   Current Outpatient Medications:  .  calcium carbonate (TUMS - DOSED IN MG ELEMENTAL CALCIUM) 500 MG chewable tablet, Chew 2 tablets by mouth 3 (three) times daily as needed for indigestion or heartburn., Disp: , Rfl:  .  cholecalciferol (VITAMIN D) 1000 units tablet, Take 1,000 Units by mouth daily., Disp: , Rfl:  .  citalopram (CELEXA) 20 MG tablet, Take 40 mg by  mouth daily. , Disp: , Rfl:  .  meclizine (ANTIVERT) 25 MG tablet, Take 1 tablet (25 mg total) by mouth 3 (three) times daily as needed for dizziness., Disp: 15 tablet, Rfl: 0 .  medroxyPROGESTERone (PROVERA) 10 MG tablet, Take 1 tablet (10 mg total) by mouth every 6 (six) hours for 10 days., Disp: 40 tablet, Rfl: 0 .  Multiple Vitamin (MULTIVITAMIN WITH MINERALS) TABS tablet, Take 1 tablet by mouth daily. WOMEN'S ONE A DAY, Disp: , Rfl:  .  traZODone (DESYREL) 100 MG tablet, Take 300 mg by mouth at bedtime. , Disp: , Rfl:     Review of Systems  Per HPI unless specifically indicated above     Objective:    LMP 08/01/2018 Comment: tubal ligation  Wt Readings from Last 3 Encounters:  08/05/18 273 lb (123.8 kg)  07/26/18 270 lb (122.5 kg)  07/24/18 268 lb (121.6 kg)    Physical Exam Pulmonary:     Effort: No respiratory distress.  Neurological:     Mental Status: She is alert and oriented to person, place, and time.  Psychiatric:        Attention and Perception: Attention normal.        Speech: Speech normal.        Behavior: Behavior is cooperative.     Results for orders placed or  performed during the hospital encounter of 08/05/18  CBC  Result Value Ref Range   WBC 6.4 4.0 - 10.5 K/uL   RBC 4.00 3.87 - 5.11 MIL/uL   Hemoglobin 10.8 (L) 12.0 - 15.0 g/dL   HCT 34.7 (L) 36.0 - 46.0 %   MCV 86.8 80.0 - 100.0 fL   MCH 27.0 26.0 - 34.0 pg   MCHC 31.1 30.0 - 36.0 g/dL   RDW 15.9 (H) 11.5 - 15.5 %   Platelets 290 150 - 400 K/uL   nRBC 0.0 0.0 - 0.2 %  Basic metabolic panel  Result Value Ref Range   Sodium 140 135 - 145 mmol/L   Potassium 3.5 3.5 - 5.1 mmol/L   Chloride 107 98 - 111 mmol/L   CO2 24 22 - 32 mmol/L   Glucose, Bld 109 (H) 70 - 99 mg/dL   BUN 10 6 - 20 mg/dL   Creatinine, Ser 1.10 (H) 0.44 - 1.00 mg/dL   Calcium 7.9 (L) 8.9 - 10.3 mg/dL   GFR calc non Af Amer 59 (L) >60 mL/min   GFR calc Af Amer >60 >60 mL/min   Anion gap 9 5 - 15  D-dimer, quantitative  (not at [facility])  Result Value Ref Range   D-Dimer, Quant <0.27 0.00 - 0.50 ug/mL-FEU  POC urine preg, ED (not at [facility])  Result Value Ref Range   Preg Test, Ur NEGATIVE NEGATIVE      Assessment & Plan:    Encounter Diagnoses  Name Primary?  . DUB (dysfunctional uterine bleeding) Yes  . Endometrial thickening on ultrasound   . Anemia, unspecified type   . Screening for breast cancer   . Chronic kidney disease, unspecified CKD stage   . Mood disorder ([place])      -we will Mail pt an application for [facility] [employer] [facility] so she can go to gyn.  She is encouraged to get it submitted asap.  She is to call office if she hasn't received it in 1 week. -Will refer to gyn - dub and thic endometrium -she is encouraged to take OTC iron to help with her anemia -refer for Screening mammogram -will monitor renal function -pt to continue with [facility] for mental health issues -pt is encouraged to wear a mask in public per CDC guidelines -pt to follow up 3 months.   She is to contact office sooner prn

## 2018-08-27 ENCOUNTER — Other Ambulatory Visit: Payer: Self-pay | Admitting: Physician Assistant

## 2018-08-27 DIAGNOSIS — Z1239 Encounter for other screening for malignant neoplasm of breast: Secondary | ICD-10-CM

## 2018-09-09 ENCOUNTER — Telehealth: Payer: Self-pay | Admitting: Obstetrics & Gynecology

## 2018-09-09 NOTE — Telephone Encounter (Signed)
Patient informed we are still not allowing any visitors or children to come in during appointment time unless physical assistance is needed. Asked if has had any exposure to anyone suspected or confirmed of having COVID-19 or if she was experiencing any of the following, to reschedule: fever, cough, shortness of breath, muscle pain, diarrhea, rash, vomiting, abdominal pain, red eye, weakness, bruising, bleeding, joint pain, or a severe headache.  Stated no to all.  Asked that she complete E-check-in via mychart prior to arrival.  Advised to check-in via [facility] and either call our office on arrival in our office parking lot to complete registration.  Advised to also use the provided hand sanitizer when entering the office and to wear a mask if she has one, if not, we will provide one. Pt verbalized understanding.   ° ° °

## 2018-09-10 ENCOUNTER — Ambulatory Visit: Payer: Self-pay | Admitting: Obstetrics & Gynecology

## 2018-09-10 ENCOUNTER — Other Ambulatory Visit: Payer: Self-pay

## 2018-09-13 ENCOUNTER — Other Ambulatory Visit: Payer: Self-pay

## 2018-09-13 ENCOUNTER — Encounter (HOSPITAL_COMMUNITY): Payer: Self-pay

## 2018-09-13 ENCOUNTER — Ambulatory Visit (HOSPITAL_COMMUNITY)
Admission: RE | Admit: 2018-09-13 | Discharge: 2018-09-13 | Disposition: A | Discharge status: Home / Self Care | Payer: Self-pay | Source: Ambulatory Visit | Attending: Physician Assistant | Admitting: Physician Assistant

## 2018-09-13 DIAGNOSIS — Z1239 Encounter for other screening for malignant neoplasm of breast: Secondary | ICD-10-CM | POA: Insufficient documentation

## 2018-09-17 ENCOUNTER — Telehealth: Payer: Self-pay | Admitting: Obstetrics & Gynecology

## 2018-09-17 NOTE — Telephone Encounter (Signed)
Patient informed we are still not allowing any visitors or children to come in during appointment time unless physical assistance is needed. Asked if has had any exposure to anyone suspected or confirmed of having COVID-19 or if she was experiencing any of the following, to reschedule: fever, cough, shortness of breath, muscle pain, diarrhea, rash, vomiting, abdominal pain, red eye, weakness, bruising, bleeding, joint pain, or a severe headache.  Stated no to all.  Asked that she complete E-check-in via mychart prior to arrival.  Advised to check-in via [facility] and either call our office on arrival in our office parking lot to complete registration.  Advised to also use the provided hand sanitizer when entering the office and to wear a mask if she has one, if not, we will provide one. Pt verbalized understanding.   ° ° °

## 2018-09-20 ENCOUNTER — Ambulatory Visit: Payer: Self-pay | Admitting: Obstetrics & Gynecology

## 2018-09-29 ENCOUNTER — Telehealth: Payer: Self-pay | Admitting: Obstetrics & Gynecology

## 2018-09-29 NOTE — Telephone Encounter (Signed)
Called patient and left message informing her that we are not allowing any visitors or children to come to visit with her at this time and we are requiring a mask to be worn during the visit. Asked if she has had any exposure to anyone suspected or confirmed of having COVID-19 or if she is experiencing any of the following: fever, cough, sob, muscle pain, severe headache, sore throat, diarrhea, loss of taste or smell or ear, nose or throat discomfort to call and reschedule.   °

## 2018-09-30 ENCOUNTER — Other Ambulatory Visit: Payer: Self-pay

## 2018-09-30 ENCOUNTER — Encounter: Payer: Self-pay | Admitting: Obstetrics & Gynecology

## 2018-09-30 ENCOUNTER — Ambulatory Visit (INDEPENDENT_AMBULATORY_CARE_PROVIDER_SITE_OTHER): Payer: Self-pay | Admitting: Obstetrics & Gynecology

## 2018-09-30 VITALS — BP 119/77 | HR 78 | Ht 66.0 in | Wt 268.0 lb

## 2018-09-30 DIAGNOSIS — N921 Excessive and frequent menstruation with irregular cycle: Secondary | ICD-10-CM

## 2018-09-30 DIAGNOSIS — D500 Iron deficiency anemia secondary to blood loss (chronic): Secondary | ICD-10-CM

## 2018-09-30 DIAGNOSIS — N946 Dysmenorrhea, unspecified: Secondary | ICD-10-CM

## 2018-09-30 MED ORDER — MEGESTROL ACETATE 40 MG PO TABS: ORAL_TABLET | ORAL | 3 refills | Status: DC

## 2018-09-30 NOTE — Progress Notes (Signed)
Chief Complaint  Patient presents with  . Menorrhagia    had thickened endometrium on [place]      48 y.o. [id number] Patient's last menstrual period was 05/17/2018. The current method of family planning is tubal ligation.  Outpatient Encounter Medications as of 09/30/2018  Medication Sig  . calcium carbonate (TUMS - DOSED IN MG ELEMENTAL CALCIUM) 500 MG chewable tablet Chew 2 tablets by mouth 3 (three) times daily as needed for indigestion or heartburn.  . cholecalciferol (VITAMIN D) 1000 units tablet Take 1,000 Units by mouth daily.  . citalopram (CELEXA) 20 MG tablet Take 40 mg by mouth daily.   . meclizine (ANTIVERT) 25 MG tablet Take 1 tablet (25 mg total) by mouth 3 (three) times daily as needed for dizziness.  . Multiple Vitamin (MULTIVITAMIN WITH MINERALS) TABS tablet Take 1 tablet by mouth daily. WOMEN'S ONE A DAY  . traZODone (DESYREL) 100 MG tablet Take 300 mg by mouth at bedtime.   . medroxyPROGESTERone (PROVERA) 10 MG tablet Take 1 tablet (10 mg total) by mouth every 6 (six) hours for 10 days.  . megestrol (MEGACE) 40 MG tablet 3 tablets a day for 5 days, 2 tablets a day for 5 days then 1 tablet daily   No facility-administered encounter medications on file as of 09/30/2018.     Subjective Pt with many years of heavy long periods Bled from April to July daily heavy hemoglobin 10.2 Stopped for 6 weeks now Sonogram with thickened endometrium 21 mm homogenous sonogram otherwise normal Past Medical History:  Diagnosis Date  . Anemia   . Anxiety   . Asthma    as child  . Cancer ([place])    cervical cx at age 79  . Cervical cancer ([place])   . Chronic back pain   . Chronic kidney disease   . CKD (chronic kidney disease), symptom management only, stage 3 (moderate) ([place])   . DDD (degenerative disc disease), lumbar    Facet arthritis  . GERD (gastroesophageal reflux disease)   . Hypotension   . Left knee pain   . Migraine   . Peripheral neuropathy   . Sciatica   .  Substance abuse ([place])   . Vaginal Pap smear, abnormal     Past Surgical History:  Procedure Laterality Date  . CERVICAL CONIZATION W/BX N/A 04/01/2017   Procedure: LASER CONIZATION CERVIX WITH BIOPSY;  Surgeon: Florian Buff, MD;  Location: [facility];  Service: Gynecology;  Laterality: N/A;  . CHOLECYSTECTOMY N/A 01/20/2013   Procedure: LAPAROSCOPIC CHOLECYSTECTOMY WITH INTRAOPERATIVE CHOLANGIOGRAM;  Surgeon: Shann Medal, MD;  Location: [facility];  Service: General;  Laterality: N/A;  . TUBAL LIGATION      OB History    Gravida  9   Para  7   Term  5   Preterm  2   AB  2   Living  7     SAB  2   TAB      Ectopic      Multiple      Live Births  1           Allergies  Allergen Reactions  . Benadryl [Diphenhydramine Hcl] Anaphylaxis, Swelling and Other (See Comments)    Back of throat closes  . Onion Anaphylaxis    Social History   Socioeconomic History  . Marital status: Single    Spouse name: Not on file  . Number of children: Not on file  . Years of education:  Not on file  . Highest education level: Not on file  Occupational History  . Not on file  Social Needs  . Financial resource strain: Not on file  . Food insecurity    Worry: Not on file    Inability: Not on file  . Transportation needs    Medical: Not on file    Non-medical: Not on file  Tobacco Use  . Smoking status: Never Smoker  . Smokeless tobacco: Never Used  Substance and Sexual Activity  . Alcohol use: Yes    Alcohol/week: 0.0 standard drinks    Comment: occasional  . Drug use: Not Currently    Types: "Crack" cocaine  . Sexual activity: Not Currently    Birth control/protection: Surgical  Lifestyle  . Physical activity    Days per week: Not on file    Minutes per session: Not on file  . Stress: Not on file  Relationships  . Social Herbalist on phone: Not on file    Gets together: Not on file    Attends religious service: Not on file    Active member of club  or organization: Not on file    Attends meetings of clubs or organizations: Not on file    Relationship status: Not on file  Other Topics Concern  . Not on file  Social History Narrative  . Not on file    Family History  Problem Relation Age of Onset  . Cancer Mother   . Colon cancer Mother   . Breast cancer Maternal Grandmother   . Heart disease Maternal Grandmother   . Cancer Maternal Grandmother   . Breast cancer Paternal Grandmother   . Heart disease Paternal Grandmother   . Hypertension Paternal Grandmother   . Alcohol abuse Father   . Drug abuse Father   . Heart disease Father   . Asthma Brother   . Cancer Paternal Grandfather     Medications:       Current Outpatient Medications:  .  calcium carbonate (TUMS - DOSED IN MG ELEMENTAL CALCIUM) 500 MG chewable tablet, Chew 2 tablets by mouth 3 (three) times daily as needed for indigestion or heartburn., Disp: , Rfl:  .  cholecalciferol (VITAMIN D) 1000 units tablet, Take 1,000 Units by mouth daily., Disp: , Rfl:  .  citalopram (CELEXA) 20 MG tablet, Take 40 mg by mouth daily. , Disp: , Rfl:  .  meclizine (ANTIVERT) 25 MG tablet, Take 1 tablet (25 mg total) by mouth 3 (three) times daily as needed for dizziness., Disp: 15 tablet, Rfl: 0 .  Multiple Vitamin (MULTIVITAMIN WITH MINERALS) TABS tablet, Take 1 tablet by mouth daily. WOMEN'S ONE A DAY, Disp: , Rfl:  .  traZODone (DESYREL) 100 MG tablet, Take 300 mg by mouth at bedtime. , Disp: , Rfl:  .  medroxyPROGESTERone (PROVERA) 10 MG tablet, Take 1 tablet (10 mg total) by mouth every 6 (six) hours for 10 days., Disp: 40 tablet, Rfl: 0 .  megestrol (MEGACE) 40 MG tablet, 3 tablets a day for 5 days, 2 tablets a day for 5 days then 1 tablet daily, Disp: 45 tablet, Rfl: 3  Objective Blood pressure 119/77, pulse 78, height 5\' 6"  (1.676 m), weight 268 lb (121.6 kg), last menstrual period 05/17/2018.  General [facility] female NAD Vulva:  normal appearing vulva with no masses, tenderness  or lesions Vagina:  normal mucosa, no discharge Cervix:  Normal no lesions Uterus:  normal size, contour, position, consistency, mobility, non-tender  Adnexa: ovaries:present,  normal adnexa in size, nontender and no masses   Pertinent ROS No burning with urination, frequency or urgency No nausea, vomiting or diarrhea Nor fever chills or other constitutional symptoms   Labs or studies Reviewed scans and labs    Impression Diagnoses this Encounter::   ICD-10-CM   1. Menometrorrhagia  N92.1   2. Dysmenorrhea  N94.6   3. Iron deficiency anemia secondary to blood loss (chronic)  D50.0     Established relevant diagnosis(es):   Plan/Recommendations: Meds ordered this encounter  Medications  . megestrol (MEGACE) 40 MG tablet    Sig: 3 tablets a day for 5 days, 2 tablets a day for 5 days then 1 tablet daily    Dispense:  45 tablet    Refill:  3    Labs or Scans Ordered: No orders of the defined types were placed in this encounter.   Management:: >megestrol therapy to keep menstrual bleeding to a minimum >schedule hysteroscopy uterine curettage Minerva ablation 11/10/2018  Follow up Return in about 7 weeks (around 11/19/2018) for [place], with Dr Elonda Husky.     All questions were answered.

## 2018-11-02 NOTE — Patient Instructions (Signed)
Kristi Martin  11/02/2018     @PREFPERIOPPHARMACY @   Your procedure is scheduled on  11/10/2018 .  Report to [facility] at  [phone]  A.M.  Call this number if you have problems the morning of surgery:  [phone]   Remember:  Do not eat or drink after midnight.                       Take these medicines the morning of surgery with A SIP OF WATER  Celexa, antivert.    Do not wear jewelry, make-up or nail polish.  Do not wear lotions, powders, or perfume. Please wear deodorant and brush your teeth.  Do not shave 48 hours prior to surgery.  Men may shave face and neck.  Do not bring valuables to the hospital.  [facility] is not responsible for any belongings or valuables.  Contacts, dentures or bridgework may not be worn into surgery.  Leave your suitcase in the car.  After surgery it may be brought to your room.  For patients admitted to the hospital, discharge time will be determined by your treatment team.  Patients discharged the day of surgery will not be allowed to drive home.   Name and phone number of your driver:   family Special instructions:  None  Please read over the following fact sheets that you were given. Anesthesia Post-op Instructions and Care and Recovery After Surgery       Dilation and Curettage or Vacuum Curettage, Care After These instructions give you information about caring for yourself after your procedure. Your doctor may also give you more specific instructions. Call your doctor if you have any problems or questions after your procedure. Follow these instructions at home: Activity  Do not drive or use heavy machinery while taking prescription pain medicine.  For 24 hours after your procedure, avoid driving.  Take short walks often, followed by rest periods. Ask your doctor what activities are safe for you. After one or two days, you may be able to return to your normal activities.  Do not lift anything that is  heavier than 10 lb (4.5 kg) until your doctor approves.  For at least 2 weeks, or as long as told by your doctor: ? Do not douche. ? Do not use tampons. ? Do not have sex. General instructions   Take over-the-counter and prescription medicines only as told by your doctor. This is very important if you take blood thinning medicine.  Do not take baths, swim, or use a hot tub until your doctor approves. Take showers instead of baths.  Wear compression stockings as told by your doctor.  It is up to you to get the results of your procedure. Ask your doctor when your results will be ready.  Keep all follow-up visits as told by your doctor. This is important. Contact a doctor if:  You have very bad cramps that get worse or do not get better with medicine.  You have very bad pain in your belly (abdomen).  You cannot drink fluids without throwing up (vomiting).  You get pain in a different part of the area between your belly and thighs (pelvis).  You have bad-smelling discharge from your vagina.  You have a rash. Get help right away if:  You are bleeding a lot from your vagina. A lot of bleeding means soaking more than one sanitary pad in an  hour, for 2 hours in a row.  You have clumps of blood (blood clots) coming from your vagina.  You have a fever or chills.  Your belly feels very tender or hard.  You have chest pain.  You have trouble breathing.  You cough up blood.  You feel dizzy.  You feel light-headed.  You pass out (faint).  You have pain in your neck or shoulder area. Summary  Take short walks often, followed by rest periods. Ask your doctor what activities are safe for you. After one or two days, you may be able to return to your normal activities.  Do not lift anything that is heavier than 10 lb (4.5 kg) until your doctor approves.  Do not take baths, swim, or use a hot tub until your doctor approves. Take showers instead of baths.  Contact your doctor  if you have any symptoms of infection, like bad-smelling discharge from your vagina. This information is not intended to replace advice given to you by your health care provider. Make sure you discuss any questions you have with your health care provider. Document Released: 10/30/2007 Document Revised: 01/02/2017 Document Reviewed: 10/08/2015 Elsevier Patient Education  2020 [place].  Endometrial Ablation Endometrial ablation is a procedure that destroys the thin inner layer of the lining of the uterus (endometrium). This procedure may be done:  To stop heavy periods.  To stop bleeding that is causing anemia.  To control irregular bleeding.  To treat bleeding caused by small tumors (fibroids) in the endometrium. This procedure is often an alternative to major surgery, such as removal of the uterus and cervix (hysterectomy). As a result of this procedure:  You may not be able to have children. However, if you are premenopausal (you have not gone through menopause): ? You may still have a small chance of getting pregnant. ? You will need to use a reliable method of birth control after the procedure to prevent pregnancy.  You may stop having a menstrual period, or you may have only a small amount of bleeding during your period. Menstruation may return several years after the procedure. Tell a health care provider about:  Any allergies you have.  All medicines you are taking, including vitamins, herbs, eye drops, creams, and over-the-counter medicines.  Any problems you or family members have had with the use of anesthetic medicines.  Any blood disorders you have.  Any surgeries you have had.  Any medical conditions you have. What are the risks? Generally, this is a safe procedure. However, problems may occur, including:  A hole (perforation) in the uterus or bowel.  Infection of the uterus, bladder, or vagina.  Bleeding.  Damage to other structures or organs.  An air  bubble in the lung (air embolus).  Problems with pregnancy after the procedure.  Failure of the procedure.  Decreased ability to diagnose cancer in the endometrium. What happens before the procedure?  You will have tests of your endometrium to make sure there are no pre-cancerous cells or cancer cells present.  You may have an ultrasound of the uterus.  You may be given medicines to thin the endometrium.  Ask your health care provider about: ? Changing or stopping your regular medicines. This is especially important if you take diabetes medicines or blood thinners. ? Taking medicines such as aspirin and ibuprofen. These medicines can thin your blood. Do not take these medicines before your procedure if your doctor tells you not to.  Plan to have someone take  you home from the hospital or clinic. What happens during the procedure?   You will lie on an exam table with your feet and legs supported as in a pelvic exam.  To lower your risk of infection: ? Your health care team will wash or sanitize their hands and put on germ-free (sterile) gloves. ? Your genital area will be washed with soap.  An IV tube will be inserted into one of your veins.  You will be given a medicine to help you relax (sedative).  A surgical instrument with a light and camera (resectoscope) will be inserted into your vagina and moved into your uterus. This allows your surgeon to see inside your uterus.  Endometrial tissue will be removed using one of the following methods: ? Radiofrequency. This method uses a radiofrequency-alternating electric current to remove the endometrium. ? Cryotherapy. This method uses extreme cold to freeze the endometrium. ? Heated-free liquid. This method uses a heated saltwater (saline) solution to remove the endometrium. ? Microwave. This method uses high-energy microwaves to heat up the endometrium and remove it. ? Thermal balloon. This method involves inserting a catheter  with a balloon tip into the uterus. The balloon tip is filled with heated fluid to remove the endometrium. The procedure may vary among health care providers and hospitals. What happens after the procedure?  Your blood pressure, heart rate, breathing rate, and blood oxygen level will be monitored until the medicines you were given have worn off.  As tissue healing occurs, you may notice vaginal bleeding for 4-6 weeks after the procedure. You may also experience: ? Cramps. ? Thin, watery vaginal discharge that is light pink or brown in color. ? A need to urinate more frequently than usual. ? Nausea.  Do not drive for 24 hours if you were given a sedative.  Do not have sex or insert anything into your vagina until your health care provider approves. Summary  Endometrial ablation is done to treat the many causes of heavy menstrual bleeding.  The procedure may be done only after medications have been tried to control the bleeding.  Plan to have someone take you home from the hospital or clinic. This information is not intended to replace advice given to you by your health care provider. Make sure you discuss any questions you have with your health care provider. Document Released: 11/30/2003 Document Revised: 07/07/2017 Document Reviewed: 02/07/2016 Elsevier Patient Education  2020 [place].  Hysteroscopy, Care After This sheet gives you information about how to care for yourself after your procedure. Your health care provider may also give you more specific instructions. If you have problems or questions, contact your health care provider. What can I expect after the procedure? After the procedure, it is common to have:  Cramping.  Bleeding. This can vary from light spotting to menstrual-like bleeding. Follow these instructions at home: Activity  Rest for 1-2 days after the procedure.  Do not douche, use tampons, or have sex for 2 weeks after the procedure, or until your  health care provider approves.  Do not drive for 24 hours after the procedure, or for as long as told by your health care provider.  Do not drive, use heavy machinery, or drink alcohol while taking prescription pain medicines. Medicines   Take over-the-counter and prescription medicines only as told by your health care provider.  Do not take aspirin during recovery. It can increase the risk of bleeding. General instructions  Do not take baths, swim, or use a  hot tub until your health care provider approves. Take showers instead of baths for 2 weeks, or for as long as told by your health care provider.  To prevent or treat constipation while you are taking prescription pain medicine, your health care provider may recommend that you: ? Drink enough fluid to keep your urine clear or pale yellow. ? Take over-the-counter or prescription medicines. ? Eat foods that are high in fiber, such as fresh fruits and vegetables, whole grains, and beans. ? Limit foods that are high in fat and processed sugars, such as fried and sweet foods.  Keep all follow-up visits as told by your health care provider. This is important. Contact a health care provider if:  You feel dizzy or lightheaded.  You feel nauseous.  You have abnormal vaginal discharge.  You have a rash.  You have pain that does not get better with medicine.  You have chills. Get help right away if:  You have bleeding that is heavier than a normal menstrual period.  You have a fever.  You have pain or cramps that get worse.  You develop new abdominal pain.  You faint.  You have pain in your shoulders.  You have shortness of breath. Summary  After the procedure, you may have cramping and some vaginal bleeding.  Do not douche, use tampons, or have sex for 2 weeks after the procedure, or until your health care provider approves.  Do not take baths, swim, or use a hot tub until your health care provider approves. Take  showers instead of baths for 2 weeks, or for as long as told by your health care provider.  Report any unusual symptoms to your health care provider.  Keep all follow-up visits as told by your health care provider. This is important. This information is not intended to replace advice given to you by your health care provider. Make sure you discuss any questions you have with your health care provider. Document Released: 11/10/2012 Document Revised: 01/02/2017 Document Reviewed: 02/19/2016 Elsevier Patient Education  2020 [place] Anesthesia, Adult, Care After This sheet gives you information about how to care for yourself after your procedure. Your health care provider may also give you more specific instructions. If you have problems or questions, contact your health care provider. What can I expect after the procedure? After the procedure, the following side effects are common:  Pain or discomfort at the IV site.  Nausea.  Vomiting.  Sore throat.  Trouble concentrating.  Feeling cold or chills.  Weak or tired.  Sleepiness and fatigue.  Soreness and body aches. These side effects can affect parts of the body that were not involved in surgery. Follow these instructions at home:  For at least 24 hours after the procedure:  Have a responsible adult stay with you. It is important to have someone help care for you until you are awake and alert.  Rest as needed.  Do not: ? Participate in activities in which you could fall or become injured. ? Drive. ? Use heavy machinery. ? Drink alcohol. ? Take sleeping pills or medicines that cause drowsiness. ? Make important decisions or sign legal documents. ? Take care of children on your own. Eating and drinking  Follow any instructions from your health care provider about eating or drinking restrictions.  When you feel hungry, start by eating small amounts of foods that are soft and easy to digest (bland), such as  toast. Gradually return to your regular diet.  Drink enough fluid to keep your urine pale yellow.  If you vomit, rehydrate by drinking water, juice, or clear broth. General instructions  If you have sleep apnea, surgery and certain medicines can increase your risk for breathing problems. Follow instructions from your health care provider about wearing your sleep device: ? Anytime you are sleeping, including during daytime naps. ? While taking prescription pain medicines, sleeping medicines, or medicines that make you drowsy.  Return to your normal activities as told by your health care provider. Ask your health care provider what activities are safe for you.  Take over-the-counter and prescription medicines only as told by your health care provider.  If you smoke, do not smoke without supervision.  Keep all follow-up visits as told by your health care provider. This is important. Contact a health care provider if:  You have nausea or vomiting that does not get better with medicine.  You cannot eat or drink without vomiting.  You have pain that does not get better with medicine.  You are unable to pass urine.  You develop a skin rash.  You have a fever.  You have redness around your IV site that gets worse. Get help right away if:  You have difficulty breathing.  You have chest pain.  You have blood in your urine or stool, or you vomit blood. Summary  After the procedure, it is common to have a sore throat or nausea. It is also common to feel tired.  Have a responsible adult stay with you for the first 24 hours after general anesthesia. It is important to have someone help care for you until you are awake and alert.  When you feel hungry, start by eating small amounts of foods that are soft and easy to digest (bland), such as toast. Gradually return to your regular diet.  Drink enough fluid to keep your urine pale yellow.  Return to your normal activities as told by  your health care provider. Ask your health care provider what activities are safe for you. This information is not intended to replace advice given to you by your health care provider. Make sure you discuss any questions you have with your health care provider. Document Released: 04/28/2000 Document Revised: 01/23/2017 Document Reviewed: 09/05/2016 Elsevier Patient Education  2020 [employer]. How to Use Chlorhexidine for Bathing Chlorhexidine gluconate (CHG) is a germ-killing (antiseptic) solution that is used to clean the skin. It can get rid of the bacteria that normally live on the skin and can keep them away for about 24 hours. To clean your skin with CHG, you may be given:  A CHG solution to use in the shower or as part of a sponge bath.  A prepackaged cloth that contains CHG. Cleaning your skin with CHG may help lower the risk for infection:  While you are staying in the intensive care unit of the hospital.  If you have a vascular access, such as a central line, to provide short-term or long-term access to your veins.  If you have a catheter to drain urine from your bladder.  If you are on a ventilator. A ventilator is a machine that helps you breathe by moving air in and out of your lungs.  After surgery. What are the risks? Risks of using CHG include:  A skin reaction.  Hearing loss, if CHG gets in your ears.  Eye injury, if CHG gets in your eyes and is not rinsed out.  The CHG product catching fire. Make  sure that you avoid smoking and flames after applying CHG to your skin. Do not use CHG:  If you have a chlorhexidine allergy or have previously reacted to chlorhexidine.  On babies younger than 73 months of age. How to use CHG solution  Use CHG only as told by your health care provider, and follow the instructions on the label.  Use the full amount of CHG as directed. Usually, this is one bottle. During a shower Follow these steps when using CHG solution during a  shower (unless your health care provider gives you different instructions): 1. Start the shower. 2. Use your normal soap and shampoo to wash your face and hair. 3. Turn off the shower or move out of the shower stream. 4. Pour the CHG onto a clean washcloth. Do not use any type of brush or rough-edged sponge. 5. Starting at your neck, lather your body down to your toes. Make sure you follow these instructions: ? If you will be having surgery, pay special attention to the part of your body where you will be having surgery. Scrub this area for at least 1 minute. ? Do not use CHG on your head or face. If the solution gets into your ears or eyes, rinse them well with water. ? Avoid your genital area. ? Avoid any areas of skin that have broken skin, cuts, or scrapes. ? Scrub your back and under your arms. Make sure to wash skin folds. 6. Let the lather sit on your skin for 1-2 minutes or as long as told by your health care provider. 7. Thoroughly rinse your entire body in the shower. Make sure that all body creases and crevices are rinsed well. 8. Dry off with a clean towel. Do not put any substances on your body afterward-such as powder, lotion, or perfume-unless you are told to do so by your health care provider. Only use lotions that are recommended by the manufacturer. 9. Put on clean clothes or pajamas. 10. If it is the night before your surgery, sleep in clean sheets.  During a sponge bath Follow these steps when using CHG solution during a sponge bath (unless your health care provider gives you different instructions): 1. Use your normal soap and shampoo to wash your face and hair. 2. Pour the CHG onto a clean washcloth. 3. Starting at your neck, lather your body down to your toes. Make sure you follow these instructions: ? If you will be having surgery, pay special attention to the part of your body where you will be having surgery. Scrub this area for at least 1 minute. ? Do not use CHG on  your head or face. If the solution gets into your ears or eyes, rinse them well with water. ? Avoid your genital area. ? Avoid any areas of skin that have broken skin, cuts, or scrapes. ? Scrub your back and under your arms. Make sure to wash skin folds. 4. Let the lather sit on your skin for 1-2 minutes or as long as told by your health care provider. 5. Using a different clean, wet washcloth, thoroughly rinse your entire body. Make sure that all body creases and crevices are rinsed well. 6. Dry off with a clean towel. Do not put any substances on your body afterward-such as powder, lotion, or perfume-unless you are told to do so by your health care provider. Only use lotions that are recommended by the manufacturer. 7. Put on clean clothes or pajamas. 8. If it is the  night before your surgery, sleep in clean sheets. How to use CHG prepackaged cloths  Only use CHG cloths as told by your health care provider, and follow the instructions on the label.  Use the CHG cloth on clean, dry skin.  Do not use the CHG cloth on your head or face unless your health care provider tells you to.  When washing with the CHG cloth: ? Avoid your genital area. ? Avoid any areas of skin that have broken skin, cuts, or scrapes. Before surgery Follow these steps when using a CHG cloth to clean before surgery (unless your health care provider gives you different instructions): 1. Using the CHG cloth, vigorously scrub the part of your body where you will be having surgery. Scrub using a back-and-forth motion for 3 minutes. The area on your body should be completely wet with CHG when you are done scrubbing. 2. Do not rinse. Discard the cloth and let the area air-dry. Do not put any substances on the area afterward, such as powder, lotion, or perfume. 3. Put on clean clothes or pajamas. 4. If it is the night before your surgery, sleep in clean sheets.  For general bathing Follow these steps when using CHG cloths  for general bathing (unless your health care provider gives you different instructions). 1. Use a separate CHG cloth for each area of your body. Make sure you wash between any folds of skin and between your fingers and toes. Wash your body in the following order, switching to a new cloth after each step: ? The front of your neck, shoulders, and chest. ? Both of your arms, under your arms, and your hands. ? Your stomach and groin area, avoiding the genitals. ? Your right leg and foot. ? Your left leg and foot. ? The back of your neck, your back, and your buttocks. 2. Do not rinse. Discard the cloth and let the area air-dry. Do not put any substances on your body afterward-such as powder, lotion, or perfume-unless you are told to do so by your health care provider. Only use lotions that are recommended by the manufacturer. 3. Put on clean clothes or pajamas. Contact a health care provider if:  Your skin gets irritated after scrubbing.  You have questions about using your solution or cloth. Get help right away if:  Your eyes become very red or swollen.  Your eyes itch badly.  Your skin itches badly and is red or swollen.  Your hearing changes.  You have trouble seeing.  You have swelling or tingling in your mouth or throat.  You have trouble breathing.  You swallow any chlorhexidine. Summary  Chlorhexidine gluconate (CHG) is a germ-killing (antiseptic) solution that is used to clean the skin. Cleaning your skin with CHG may help to lower your risk for infection.  You may be given CHG to use for bathing. It may be in a bottle or in a prepackaged cloth to use on your skin. Carefully follow your health care provider's instructions and the instructions on the product label.  Do not use CHG if you have a chlorhexidine allergy.  Contact your health care provider if your skin gets irritated after scrubbing. This information is not intended to replace advice given to you by your health  care provider. Make sure you discuss any questions you have with your health care provider. Document Released: 10/15/2011 Document Revised: 04/08/2018 Document Reviewed: 12/18/2016 Elsevier Patient Education  2020 [employer].

## 2018-11-08 ENCOUNTER — Encounter (HOSPITAL_COMMUNITY)
Admission: RE | Admit: 2018-11-08 | Discharge: 2018-11-08 | Disposition: A | Discharge status: Home / Self Care | Payer: Self-pay | Source: Ambulatory Visit | Attending: Obstetrics & Gynecology | Admitting: Obstetrics & Gynecology

## 2018-11-08 ENCOUNTER — Other Ambulatory Visit: Payer: Self-pay | Admitting: Obstetrics & Gynecology

## 2018-11-08 ENCOUNTER — Other Ambulatory Visit: Payer: Self-pay

## 2018-11-08 ENCOUNTER — Other Ambulatory Visit (HOSPITAL_COMMUNITY)
Admission: RE | Admit: 2018-11-08 | Discharge: 2018-11-08 | Disposition: A | Discharge status: Home / Self Care | Payer: Self-pay | Source: Ambulatory Visit | Attending: Obstetrics & Gynecology | Admitting: Obstetrics & Gynecology

## 2018-11-08 DIAGNOSIS — Z20828 Contact with and (suspected) exposure to other viral communicable diseases: Secondary | ICD-10-CM | POA: Insufficient documentation

## 2018-11-08 DIAGNOSIS — N946 Dysmenorrhea, unspecified: Secondary | ICD-10-CM | POA: Insufficient documentation

## 2018-11-08 DIAGNOSIS — Z01818 Encounter for other preprocedural examination: Secondary | ICD-10-CM | POA: Insufficient documentation

## 2018-11-08 DIAGNOSIS — Z01812 Encounter for preprocedural laboratory examination: Secondary | ICD-10-CM | POA: Insufficient documentation

## 2018-11-08 DIAGNOSIS — N921 Excessive and frequent menstruation with irregular cycle: Secondary | ICD-10-CM | POA: Insufficient documentation

## 2018-11-08 LAB — COMPREHENSIVE METABOLIC PANEL
ALT: 20 U/L (ref 0–44)
AST: 15 U/L (ref 15–41)
Albumin: 2.8 g/dL — ABNORMAL LOW (ref 3.5–5.0)
Alkaline Phosphatase: 66 U/L (ref 38–126)
Anion gap: 8 (ref 5–15)
BUN: 12 mg/dL (ref 6–20)
CO2: 24 mmol/L (ref 22–32)
Calcium: 8.3 mg/dL — ABNORMAL LOW (ref 8.9–10.3)
Chloride: 107 mmol/L (ref 98–111)
Creatinine, Ser: 1.11 mg/dL — ABNORMAL HIGH (ref 0.44–1.00)
GFR calc Af Amer: >60 mL/min (ref >60)
GFR calc non Af Amer: 58 mL/min — ABNORMAL LOW (ref >60)
Glucose, Bld: 93 mg/dL (ref 70–99)
Potassium: 3.5 mmol/L (ref 3.5–5.1)
Sodium: 139 mmol/L (ref 135–145)
Total Bilirubin: <0.1 mg/dL — ABNORMAL LOW (ref 0.3–1.2)
Total Protein: 6.1 g/dL — ABNORMAL LOW (ref 6.5–8.1)

## 2018-11-08 LAB — CBC
HCT: 35.6 % — ABNORMAL LOW (ref 36.0–46.0)
Hemoglobin: 10.6 g/dL — ABNORMAL LOW (ref 12.0–15.0)
MCH: 25.4 pg — ABNORMAL LOW (ref 26.0–34.0)
MCHC: 29.8 g/dL — ABNORMAL LOW (ref 30.0–36.0)
MCV: 85.4 fL (ref 80.0–100.0)
Platelets: 310 10*3/uL (ref 150–400)
RBC: 4.17 MIL/uL (ref 3.87–5.11)
RDW: 15.7 % — ABNORMAL HIGH (ref 11.5–15.5)
WBC: 6.3 10*3/uL (ref 4.0–10.5)
nRBC: 0 % (ref 0.0–0.2)

## 2018-11-08 LAB — RAPID HIV SCREEN (HIV 1/2 AB+AG)
HIV 1/2 Antibodies: NONREACTIVE
HIV-1 P24 Antigen - HIV24: NONREACTIVE

## 2018-11-08 LAB — SARS CORONAVIRUS 2 (TAT 6-24 HRS): SARS Coronavirus 2: NEGATIVE

## 2018-11-08 LAB — HCG, QUANTITATIVE, PREGNANCY: hCG, Beta Chain, Quant, S: <1 m[IU]/mL (ref <5)

## 2018-11-08 MED ORDER — CEFAZOLIN SODIUM-DEXTROSE 1-4 GM/50ML-% IV SOLN: 1.0000 g | INTRAVENOUS | Status: DC

## 2018-11-08 MED ORDER — CEFAZOLIN SODIUM-DEXTROSE 2-4 GM/100ML-% IV SOLN: 2.0000 g | INTRAVENOUS | Status: DC

## 2018-11-10 ENCOUNTER — Encounter (HOSPITAL_COMMUNITY): Payer: Self-pay | Admitting: *Deleted

## 2018-11-10 ENCOUNTER — Ambulatory Visit (HOSPITAL_COMMUNITY)
Admission: RE | Admit: 2018-11-10 | Discharge: 2018-11-10 | Disposition: A | Discharge status: Home / Self Care | Payer: Self-pay | Source: Ambulatory Visit | Attending: Obstetrics & Gynecology | Admitting: Obstetrics & Gynecology

## 2018-11-10 ENCOUNTER — Encounter (HOSPITAL_COMMUNITY)
Admission: RE | Disposition: A | Discharge status: Home / Self Care | Payer: Self-pay | Source: Ambulatory Visit | Attending: Obstetrics & Gynecology

## 2018-11-10 ENCOUNTER — Ambulatory Visit (HOSPITAL_COMMUNITY): Payer: Self-pay | Admitting: Anesthesiology

## 2018-11-10 DIAGNOSIS — J45909 Unspecified asthma, uncomplicated: Secondary | ICD-10-CM | POA: Insufficient documentation

## 2018-11-10 DIAGNOSIS — D500 Iron deficiency anemia secondary to blood loss (chronic): Secondary | ICD-10-CM | POA: Insufficient documentation

## 2018-11-10 DIAGNOSIS — Z79899 Other long term (current) drug therapy: Secondary | ICD-10-CM | POA: Insufficient documentation

## 2018-11-10 DIAGNOSIS — N946 Dysmenorrhea, unspecified: Secondary | ICD-10-CM | POA: Insufficient documentation

## 2018-11-10 DIAGNOSIS — F419 Anxiety disorder, unspecified: Secondary | ICD-10-CM | POA: Insufficient documentation

## 2018-11-10 DIAGNOSIS — N921 Excessive and frequent menstruation with irregular cycle: Secondary | ICD-10-CM | POA: Insufficient documentation

## 2018-11-10 DIAGNOSIS — Z888 Allergy status to other drugs, medicaments and biological substances status: Secondary | ICD-10-CM | POA: Insufficient documentation

## 2018-11-10 DIAGNOSIS — I129 Hypertensive chronic kidney disease with stage 1 through stage 4 chronic kidney disease, or unspecified chronic kidney disease: Secondary | ICD-10-CM | POA: Insufficient documentation

## 2018-11-10 DIAGNOSIS — N183 Chronic kidney disease, stage 3 unspecified: Secondary | ICD-10-CM | POA: Insufficient documentation

## 2018-11-10 DIAGNOSIS — D649 Anemia, unspecified: Secondary | ICD-10-CM

## 2018-11-10 DIAGNOSIS — G629 Polyneuropathy, unspecified: Secondary | ICD-10-CM | POA: Insufficient documentation

## 2018-11-10 DIAGNOSIS — Z8541 Personal history of malignant neoplasm of cervix uteri: Secondary | ICD-10-CM | POA: Insufficient documentation

## 2018-11-10 DIAGNOSIS — M1990 Unspecified osteoarthritis, unspecified site: Secondary | ICD-10-CM | POA: Insufficient documentation

## 2018-11-10 DIAGNOSIS — K219 Gastro-esophageal reflux disease without esophagitis: Secondary | ICD-10-CM | POA: Insufficient documentation

## 2018-11-10 DIAGNOSIS — F329 Major depressive disorder, single episode, unspecified: Secondary | ICD-10-CM | POA: Insufficient documentation

## 2018-11-10 HISTORY — PX: DILATATION AND CURETTAGE/HYSTEROSCOPY WITH MINERVA: SHX6851

## 2018-11-10 LAB — URINALYSIS, ROUTINE W REFLEX MICROSCOPIC
Bilirubin Urine: NEGATIVE
Glucose, UA: >=500 mg/dL — AB
Hgb urine dipstick: NEGATIVE
Ketones, ur: NEGATIVE mg/dL
Nitrite: NEGATIVE
Protein, ur: NEGATIVE mg/dL
Specific Gravity, Urine: 1.03 (ref 1.005–1.030)
pH: 6 (ref 5.0–8.0)

## 2018-11-10 LAB — RAPID URINE DRUG SCREEN, HOSP PERFORMED
Amphetamines: NOT DETECTED
Barbiturates: NOT DETECTED
Benzodiazepines: NOT DETECTED
Cocaine: NOT DETECTED
Opiates: NOT DETECTED
Tetrahydrocannabinol: NOT DETECTED

## 2018-11-10 SURGERY — DILATATION AND CURETTAGE/HYSTEROSCOPY WITH MINERVA
Anesthesia: General | Site: Vagina

## 2018-11-10 MED ORDER — KETOROLAC TROMETHAMINE 10 MG PO TABS: 10.0000 mg | ORAL_TABLET | Freq: Three times a day (TID) | ORAL | 0 refills | Status: DC | PRN

## 2018-11-10 MED ORDER — CEFAZOLIN SODIUM-DEXTROSE 1-4 GM/50ML-% IV SOLN
INTRAVENOUS | Status: AC
  Filled 2018-11-10: qty 50

## 2018-11-10 MED ORDER — SODIUM CHLORIDE 0.9 % IR SOLN
Status: DC | PRN
  Administered 2018-11-10: 1000 mL

## 2018-11-10 MED ORDER — CEFAZOLIN SODIUM-DEXTROSE 2-4 GM/100ML-% IV SOLN: 2.0000 g | INTRAVENOUS | Status: DC

## 2018-11-10 MED ORDER — MEPERIDINE HCL 50 MG/ML IJ SOLN: 6.2500 mg | INTRAMUSCULAR | Status: DC | PRN

## 2018-11-10 MED ORDER — DEXAMETHASONE SODIUM PHOSPHATE 4 MG/ML IJ SOLN
INTRAMUSCULAR | Status: AC
  Filled 2018-11-10: qty 1

## 2018-11-10 MED ORDER — ROCURONIUM BROMIDE 10 MG/ML (PF) SYRINGE
PREFILLED_SYRINGE | INTRAVENOUS | Status: AC
  Filled 2018-11-10: qty 10

## 2018-11-10 MED ORDER — 0.9 % SODIUM CHLORIDE (POUR BTL) OPTIME
TOPICAL | Status: DC | PRN
  Administered 2018-11-10: 1000 mL

## 2018-11-10 MED ORDER — DEXAMETHASONE SODIUM PHOSPHATE 4 MG/ML IJ SOLN
INTRAMUSCULAR | Status: DC | PRN
  Administered 2018-11-10: 10 mg via INTRAVENOUS

## 2018-11-10 MED ORDER — FENTANYL CITRATE (PF) 100 MCG/2ML IJ SOLN
INTRAMUSCULAR | Status: DC | PRN
  Administered 2018-11-10 (×4): 50 ug via INTRAVENOUS

## 2018-11-10 MED ORDER — PROPOFOL 10 MG/ML IV BOLUS
INTRAVENOUS | Status: AC
  Filled 2018-11-10: qty 40

## 2018-11-10 MED ORDER — DEXAMETHASONE SODIUM PHOSPHATE 4 MG/ML IJ SOLN
INTRAMUSCULAR | Status: AC
  Filled 2018-11-10: qty 2

## 2018-11-10 MED ORDER — IPRATROPIUM-ALBUTEROL 0.5-2.5 (3) MG/3ML IN SOLN
RESPIRATORY_TRACT | Status: AC
  Filled 2018-11-10: qty 3

## 2018-11-10 MED ORDER — SUCCINYLCHOLINE CHLORIDE 200 MG/10ML IV SOSY
PREFILLED_SYRINGE | INTRAVENOUS | Status: AC
  Filled 2018-11-10: qty 10

## 2018-11-10 MED ORDER — HYDROMORPHONE HCL 1 MG/ML IJ SOLN
0.2500 mg | INTRAMUSCULAR | Status: DC | PRN
  Administered 2018-11-10 (×2): 0.5 mg via INTRAVENOUS
  Filled 2018-11-10 (×2): qty 0.5

## 2018-11-10 MED ORDER — LIDOCAINE 2% (20 MG/ML) 5 ML SYRINGE
INTRAMUSCULAR | Status: AC
  Filled 2018-11-10: qty 5

## 2018-11-10 MED ORDER — SUGAMMADEX SODIUM 200 MG/2ML IV SOLN
INTRAVENOUS | Status: DC | PRN
  Administered 2018-11-10: 200 mg via INTRAVENOUS

## 2018-11-10 MED ORDER — HYDROCODONE-ACETAMINOPHEN 5-325 MG PO TABS: 1.0000 | ORAL_TABLET | Freq: Four times a day (QID) | ORAL | 0 refills | Status: DC | PRN

## 2018-11-10 MED ORDER — LACTATED RINGERS IV SOLN
Freq: Once | INTRAVENOUS | Status: AC
  Administered 2018-11-10: 07:00:00 via INTRAVENOUS

## 2018-11-10 MED ORDER — FENTANYL CITRATE (PF) 250 MCG/5ML IJ SOLN
INTRAMUSCULAR | Status: AC
  Filled 2018-11-10: qty 5

## 2018-11-10 MED ORDER — ONDANSETRON 8 MG PO TBDP: 8.0000 mg | ORAL_TABLET | Freq: Three times a day (TID) | ORAL | 0 refills | Status: DC | PRN

## 2018-11-10 MED ORDER — LACTATED RINGERS IV SOLN
INTRAVENOUS | Status: DC | PRN
  Administered 2018-11-10: 07:00:00 via INTRAVENOUS

## 2018-11-10 MED ORDER — ONDANSETRON HCL 4 MG/2ML IJ SOLN
INTRAMUSCULAR | Status: DC | PRN
  Administered 2018-11-10: 4 mg via INTRAVENOUS

## 2018-11-10 MED ORDER — ROCURONIUM 10MG/ML (10ML) SYRINGE FOR MEDFUSION PUMP - OPTIME
INTRAVENOUS | Status: DC | PRN
  Administered 2018-11-10: 30 mg via INTRAVENOUS

## 2018-11-10 MED ORDER — LIDOCAINE HCL (CARDIAC) PF 50 MG/5ML IV SOSY
PREFILLED_SYRINGE | INTRAVENOUS | Status: DC | PRN
  Administered 2018-11-10 (×2): 50 mg via INTRAVENOUS

## 2018-11-10 MED ORDER — CEFAZOLIN SODIUM-DEXTROSE 1-4 GM/50ML-% IV SOLN: 1.0000 g | INTRAVENOUS | Status: DC

## 2018-11-10 MED ORDER — DEXTROSE 5 % IV SOLN
3.0000 g | Freq: Once | INTRAVENOUS | Status: DC
  Administered 2018-11-10: 08:00:00 3 g via INTRAVENOUS
  Filled 2018-11-10: qty 3000

## 2018-11-10 MED ORDER — ONDANSETRON HCL 4 MG/2ML IJ SOLN
INTRAMUSCULAR | Status: AC
  Filled 2018-11-10: qty 2

## 2018-11-10 MED ORDER — SUCCINYLCHOLINE 20MG/ML (10ML) SYRINGE FOR MEDFUSION PUMP - OPTIME
INTRAMUSCULAR | Status: DC | PRN
  Administered 2018-11-10: 120 mg via INTRAVENOUS

## 2018-11-10 MED ORDER — CEFAZOLIN SODIUM-DEXTROSE 2-4 GM/100ML-% IV SOLN
INTRAVENOUS | Status: AC
  Filled 2018-11-10: qty 100

## 2018-11-10 MED ORDER — KETOROLAC TROMETHAMINE 30 MG/ML IJ SOLN
30.0000 mg | Freq: Once | INTRAMUSCULAR | Status: AC
  Administered 2018-11-10: 30 mg via INTRAVENOUS
  Filled 2018-11-10: qty 1

## 2018-11-10 MED ORDER — PROPOFOL 10 MG/ML IV BOLUS
INTRAVENOUS | Status: DC | PRN
  Administered 2018-11-10: 200 mg via INTRAVENOUS

## 2018-11-10 MED ORDER — ONDANSETRON HCL 4 MG/2ML IJ SOLN
4.0000 mg | Freq: Once | INTRAMUSCULAR | Status: AC | PRN
  Administered 2018-11-10: 09:00:00 4 mg via INTRAVENOUS
  Filled 2018-11-10: qty 2

## 2018-11-10 SURGICAL SUPPLY — 27 items
CLOTH BEACON ORANGE TIMEOUT ST (SAFETY) ×3 IMPLANT
COVER LIGHT HANDLE STERIS (MISCELLANEOUS) ×6 IMPLANT
GAUZE 4X4 16PLY RFD (DISPOSABLE) ×6 IMPLANT
GLOVE BIO SURGEON STRL SZ7 (GLOVE) ×2 IMPLANT
GLOVE BIOGEL PI IND STRL 7.0 (GLOVE) ×2 IMPLANT
GLOVE BIOGEL PI IND STRL 8 (GLOVE) ×1 IMPLANT
GLOVE BIOGEL PI INDICATOR 7.0 (GLOVE) ×4
GLOVE BIOGEL PI INDICATOR 8 (GLOVE) ×2
GLOVE ECLIPSE 8.0 STRL XLNG CF (GLOVE) ×3 IMPLANT
GOWN STRL REUS W/TWL LRG LVL3 (GOWN DISPOSABLE) ×3 IMPLANT
GOWN STRL REUS W/TWL XL LVL3 (GOWN DISPOSABLE) ×3 IMPLANT
HANDPIECE ABLA MINERVA ENDO (MISCELLANEOUS) ×3 IMPLANT
INST SET HYSTEROSCOPY (KITS) ×3 IMPLANT
IV NS 1000ML (IV SOLUTION) ×3
IV NS 1000ML BAXH (IV SOLUTION) ×1 IMPLANT
KIT TURNOVER CYSTO (KITS) ×3 IMPLANT
MANIFOLD NEPTUNE II (INSTRUMENTS) ×3 IMPLANT
MARKER SKIN DUAL TIP RULER LAB (MISCELLANEOUS) ×3 IMPLANT
NS IRRIG 1000ML POUR BTL (IV SOLUTION) ×3 IMPLANT
PACK BASIC III (CUSTOM PROCEDURE TRAY) ×3
PACK SRG BSC III STRL LF ECLPS (CUSTOM PROCEDURE TRAY) ×1 IMPLANT
PAD ARMBOARD 7.5X6 YLW CONV (MISCELLANEOUS) ×3 IMPLANT
PAD TELFA 3X4 1S STER (GAUZE/BANDAGES/DRESSINGS) ×3 IMPLANT
SET BASIN LINEN APH (SET/KITS/TRAYS/PACK) ×3 IMPLANT
SET IRRIG Y TYPE TUR BLADDER L (SET/KITS/TRAYS/PACK) ×3 IMPLANT
SHEET LAVH (DRAPES) ×3 IMPLANT
YANKAUER SUCT BULB TIP 10FT TU (MISCELLANEOUS) ×3 IMPLANT

## 2018-11-10 NOTE — Anesthesia Postprocedure Evaluation (Signed)
Anesthesia Post Note  Patient: Kristi Martin  Procedure(s) Performed: DILATATION AND CURETTAGE/HYSTEROSCOPY WITH MINERVA (N/A Vagina )  Patient location during evaluation: PACU Anesthesia Type: General Level of consciousness: awake and alert and oriented Pain management: pain level controlled Vital Signs Assessment: post-procedure vital signs reviewed and stable Respiratory status: spontaneous breathing and non-rebreather facemask Cardiovascular status: blood pressure returned to baseline and stable Postop Assessment: no apparent nausea or vomiting Anesthesia complication: see transfer note.     Last Vitals:  Vitals:   11/10/18 0858 11/10/18 0910  BP: 103/60   Pulse: 80 89  Resp: 11 (!) 25  Temp:    SpO2: 100% 100%    Last Pain:  Vitals:   11/10/18 0858  TempSrc:   PainSc: 5                  Chalsey Leeth

## 2018-11-10 NOTE — Anesthesia Procedure Notes (Signed)
Procedure Name: Intubation Date/Time: 11/10/2018 7:39 AM Performed by: Ollen Bowl, CRNA Pre-anesthesia Checklist: Patient identified, Patient being monitored, Timeout performed, Emergency Drugs available and Suction available Patient Re-evaluated:Patient Re-evaluated prior to induction Oxygen Delivery Method: Circle system utilized Preoxygenation: Pre-oxygenation with 100% oxygen Induction Type: IV induction Ventilation: Mask ventilation without difficulty Laryngoscope Size: Mac and 3 Grade View: Grade I Tube type: Oral Tube size: 7.0 mm Number of attempts: 1 Airway Equipment and Method: Stylet Placement Confirmation: ETT inserted through vocal cords under direct vision,  positive ETCO2 and breath sounds checked- equal and bilateral Secured at: 22 cm Tube secured with: Tape Dental Injury: Teeth and Oropharynx as per pre-operative assessment

## 2018-11-10 NOTE — H&P (Signed)
Chief Complaint  Patient presents with  . Menorrhagia    had thickened endometrium on [place]  48 y.o. [id number] Patient's last menstrual period was 05/17/2018. The current method of family planning is tubal ligation.      Outpatient Encounter Medications as of 09/30/2018  Medication Sig  . calcium carbonate (TUMS - DOSED IN MG ELEMENTAL CALCIUM) 500 MG chewable tablet Chew 2 tablets by mouth 3 (three) times daily as needed for indigestion or heartburn.  . cholecalciferol (VITAMIN D) 1000 units tablet Take 1,000 Units by mouth daily.  . citalopram (CELEXA) 20 MG tablet Take 40 mg by mouth daily.   . meclizine (ANTIVERT) 25 MG tablet Take 1 tablet (25 mg total) by mouth 3 (three) times daily as needed for dizziness.  . Multiple Vitamin (MULTIVITAMIN WITH MINERALS) TABS tablet Take 1 tablet by mouth daily. WOMEN'S ONE A DAY  . traZODone (DESYREL) 100 MG tablet Take 300 mg by mouth at bedtime.   . medroxyPROGESTERone (PROVERA) 10 MG tablet Take 1 tablet (10 mg total) by mouth every 6 (six) hours for 10 days.  . megestrol (MEGACE) 40 MG tablet 3 tablets a day for 5 days, 2 tablets a day for 5 days then 1 tablet daily   No facility-administered encounter medications on file as of 09/30/2018.   Subjective  Pt with many years of heavy long periods  Bled from April to July daily heavy hemoglobin 10.2  Stopped for 6 weeks now  Sonogram with thickened endometrium 21 mm homogenous sonogram otherwise normal      Past Medical History:  Diagnosis Date  . Anemia   . Anxiety   . Asthma    as child  . Cancer ([place])    cervical cx at age 63  . Cervical cancer ([place])   . Chronic back pain   . Chronic kidney disease   . CKD (chronic kidney disease), symptom management only, stage 3 (moderate) ([place])   . DDD (degenerative disc disease), lumbar    Facet arthritis  . GERD (gastroesophageal reflux disease)   . Hypotension   . Left knee pain   . Migraine   . Peripheral neuropathy   . Sciatica   . Substance  abuse ([place])   . Vaginal Pap smear, abnormal         Past Surgical History:  Procedure Laterality Date  . CERVICAL CONIZATION W/BX N/A 04/01/2017   Procedure: LASER CONIZATION CERVIX WITH BIOPSY; Surgeon: Florian Buff, MD; Location: [facility]; Service: Gynecology; Laterality: N/A;  . CHOLECYSTECTOMY N/A 01/20/2013   Procedure: LAPAROSCOPIC CHOLECYSTECTOMY WITH INTRAOPERATIVE CHOLANGIOGRAM; Surgeon: Shann Medal, MD; Location: [facility]; Service: General; Laterality: N/A;  . TUBAL LIGATION             OB History     Gravida Para Term Preterm AB Living   9 7 5 2 2 7     SAB TAB Ectopic Multiple Live Births    2    1            Allergies  Allergen Reactions  . Benadryl [Diphenhydramine Hcl] Anaphylaxis, Swelling and Other (See Comments)    Back of throat closes  . Onion Anaphylaxis   Social History        Socioeconomic History  . Marital status: Single    Spouse name: Not on file  . Number of children: Not on file  . Years of education: Not on file  . Highest education level: Not on file  Occupational History  .  Not on file  Social Needs  . Financial resource strain: Not on file  . Food insecurity    Worry: Not on file    Inability: Not on file  . Transportation needs    Medical: Not on file    Non-medical: Not on file  Tobacco Use  . Smoking status: Never Smoker  . Smokeless tobacco: Never Used  Substance and Sexual Activity  . Alcohol use: Yes    Alcohol/week: 0.0 standard drinks    Comment: occasional  . Drug use: Not Currently    Types: "Crack" cocaine  . Sexual activity: Not Currently    Birth control/protection: Surgical  Lifestyle  . Physical activity    Days per week: Not on file    Minutes per session: Not on file  . Stress: Not on file  Relationships  . Social Herbalist on phone: Not on file    Gets together: Not on file    Attends religious service: Not on file    Active member of club or organization: Not on file    Attends  meetings of clubs or organizations: Not on file    Relationship status: Not on file  Other Topics Concern  . Not on file  Social History Narrative  . Not on file        Family History  Problem Relation Age of Onset  . Cancer Mother   . Colon cancer Mother   . Breast cancer Maternal Grandmother   . Heart disease Maternal Grandmother   . Cancer Maternal Grandmother   . Breast cancer Paternal Grandmother   . Heart disease Paternal Grandmother   . Hypertension Paternal Grandmother   . Alcohol abuse Father   . Drug abuse Father   . Heart disease Father   . Asthma Brother   . Cancer Paternal Grandfather   Medications:  Current Outpatient Medications:  . calcium carbonate (TUMS - DOSED IN MG ELEMENTAL CALCIUM) 500 MG chewable tablet, Chew 2 tablets by mouth 3 (three) times daily as needed for indigestion or heartburn., Disp: , Rfl:  . cholecalciferol (VITAMIN D) 1000 units tablet, Take 1,000 Units by mouth daily., Disp: , Rfl:  . citalopram (CELEXA) 20 MG tablet, Take 40 mg by mouth daily. , Disp: , Rfl:  . meclizine (ANTIVERT) 25 MG tablet, Take 1 tablet (25 mg total) by mouth 3 (three) times daily as needed for dizziness., Disp: 15 tablet, Rfl: 0  . Multiple Vitamin (MULTIVITAMIN WITH MINERALS) TABS tablet, Take 1 tablet by mouth daily. WOMEN'S ONE A DAY, Disp: , Rfl:  . traZODone (DESYREL) 100 MG tablet, Take 300 mg by mouth at bedtime. , Disp: , Rfl:  . medroxyPROGESTERone (PROVERA) 10 MG tablet, Take 1 tablet (10 mg total) by mouth every 6 (six) hours for 10 days., Disp: 40 tablet, Rfl: 0  . megestrol (MEGACE) 40 MG tablet, 3 tablets a day for 5 days, 2 tablets a day for 5 days then 1 tablet daily, Disp: 45 tablet, Rfl: 3  Objective  Blood pressure 119/77, pulse 78, height 5\' 6"  (1.676 m), weight 268 lb (121.6 kg), last menstrual period 05/17/2018.  General WDWN female NAD  Vulva: normal appearing vulva with no masses, tenderness or lesions  Vagina: normal mucosa, no discharge   Cervix: Normal no lesions  Uterus: normal size, contour, position, consistency, mobility, non-tender  Adnexa: ovaries:present, normal adnexa in size, nontender and no masses  Pertinent ROS  No burning with urination, frequency or urgency  No  nausea, vomiting or diarrhea  Nor fever chills or other constitutional symptoms  Labs or studies  Results for orders placed or performed during the hospital encounter of 11/10/18 (from the past 168 hour(s))  Rapid urine drug screen (hospital performed)   Collection Time: 11/10/18  6:27 AM  Result Value Ref Range   Opiates NONE DETECTED NONE DETECTED   Cocaine NONE DETECTED NONE DETECTED   Benzodiazepines NONE DETECTED NONE DETECTED   Amphetamines NONE DETECTED NONE DETECTED   Tetrahydrocannabinol NONE DETECTED NONE DETECTED   Barbiturates NONE DETECTED NONE DETECTED  Urinalysis, Routine w reflex microscopic   Collection Time: 11/10/18  6:27 AM  Result Value Ref Range   Color, Urine YELLOW YELLOW   APPearance HAZY (A) CLEAR   Specific Gravity, Urine 1.030 1.005 - 1.030   pH 6.0 5.0 - 8.0   Glucose, UA >=500 (A) NEGATIVE mg/dL   Hgb urine dipstick NEGATIVE NEGATIVE   Bilirubin Urine NEGATIVE NEGATIVE   Ketones, ur NEGATIVE NEGATIVE mg/dL   Protein, ur NEGATIVE NEGATIVE mg/dL   Nitrite NEGATIVE NEGATIVE   Leukocytes,Ua TRACE (A) NEGATIVE   RBC / HPF 0-5 0 - 5 RBC/hpf   WBC, UA 0-5 0 - 5 WBC/hpf   Bacteria, UA FEW (A) NONE SEEN   Squamous Epithelial / LPF 11-20 0 - 5   Mucus PRESENT    Budding Yeast PRESENT    Hyaline Casts, UA PRESENT   Results for orders placed or performed during the hospital encounter of 11/08/18 (from the past 168 hour(s))  CBC   Collection Time: 11/08/18  9:43 AM  Result Value Ref Range   WBC 6.3 4.0 - 10.5 K/uL   RBC 4.17 3.87 - 5.11 MIL/uL   Hemoglobin 10.6 (L) 12.0 - 15.0 g/dL   HCT 35.6 (L) 36.0 - 46.0 %   MCV 85.4 80.0 - 100.0 fL   MCH 25.4 (L) 26.0 - 34.0 pg   MCHC 29.8 (L) 30.0 - 36.0 g/dL   RDW 15.7  (H) 11.5 - 15.5 %   Platelets 310 150 - 400 K/uL   nRBC 0.0 0.0 - 0.2 %  Comprehensive metabolic panel   Collection Time: 11/08/18  9:43 AM  Result Value Ref Range   Sodium 139 135 - 145 mmol/L   Potassium 3.5 3.5 - 5.1 mmol/L   Chloride 107 98 - 111 mmol/L   CO2 24 22 - 32 mmol/L   Glucose, Bld 93 70 - 99 mg/dL   BUN 12 6 - 20 mg/dL   Creatinine, Ser 1.11 (H) 0.44 - 1.00 mg/dL   Calcium 8.3 (L) 8.9 - 10.3 mg/dL   Total Protein 6.1 (L) 6.5 - 8.1 g/dL   Albumin 2.8 (L) 3.5 - 5.0 g/dL   AST 15 15 - 41 U/L   ALT 20 0 - 44 U/L   Alkaline Phosphatase 66 38 - 126 U/L   Total Bilirubin <0.1 (L) 0.3 - 1.2 mg/dL   GFR calc non Af Amer 58 (L) >60 mL/min   GFR calc Af Amer >60 >60 mL/min   Anion gap 8 5 - 15  hCG, quantitative, pregnancy   Collection Time: 11/08/18  9:43 AM  Result Value Ref Range   hCG, Beta Chain, Quant, S <1 <5 mIU/mL  Rapid HIV screen (HIV 1/2 Ab+Ag)   Collection Time: 11/08/18  9:43 AM  Result Value Ref Range   HIV-1 P24 Antigen - HIV24 NON REACTIVE NON REACTIVE   HIV 1/2 Antibodies NON REACTIVE NON REACTIVE   Interpretation (  HIV Ag Ab)      A non reactive test result means that HIV 1 or HIV 2 antibodies and HIV 1 p24 antigen were not detected in the specimen.  Results for orders placed or performed during the hospital encounter of 11/08/18 (from the past 168 hour(s))  SARS CORONAVIRUS 2 (TAT 6-24 HRS) Nasopharyngeal Nasopharyngeal Swab   Collection Time: 11/08/18  8:12 AM   Specimen: Nasopharyngeal Swab  Result Value Ref Range   SARS Coronavirus 2 NEGATIVE NEGATIVE    Reviewed scans and labs  CLINICAL DATA:  Bleeding for 4 weeks, bloating.  EXAM: TRANSABDOMINAL AND TRANSVAGINAL ULTRASOUND OF PELVIS  TECHNIQUE: Both transabdominal and transvaginal ultrasound examinations of the pelvis were performed. Transabdominal technique was performed for global imaging of the pelvis including uterus, ovaries, adnexal regions, and pelvic cul-de-sac. It was  necessary to proceed with endovaginal exam following the transabdominal exam to visualize the adnexal structures and endometrial complex.  COMPARISON:  None  FINDINGS: Uterus  Measurements: 7.4 x 5.5 x 6 cm = volume: 130 mL. No fibroids or other mass visualized.  Endometrium  Thickness: 2.1 cm.  No focal abnormality visualized.  Right ovary  Measurements: 1.9 x 1.3 x 2.3 cm = volume: 2.9 mL. Normal appearance/no adnexal mass.  Left ovary  Not seen.  No adnexal mass identified.  Other findings  Small amount of free fluid in the cul-de-sac.  IMPRESSION: 1. Endometrium is abnormally thickened with a measurement of 2.1 cm. Endometrial hyperplasia and neoplasia are considerations. Consider endometrial sampling. 2. RIGHT ovary is grossly normal in appearance and there is no mass identified in the RIGHT adnexal region. 3. LEFT ovary is not seen but there is no mass identified in the LEFT adnexal region. 4. Small amount of free fluid in the pelvis, likely physiologic in nature.   Electronically Signed   By: Franki Cabot M.D.   On: 06/27/2018 11:44  Impression  Diagnoses this Encounter::    ICD-10-CM   1. Menometrorrhagia  N92.1   2. Dysmenorrhea  N94.6   3. 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 megestrol (MEGACE) 40 MG tablet    Sig: 3 tablets a day for 5 days, 2 tablets a day for 5 days then 1 tablet daily    Dispense: 45 tablet    Refill: 3  Labs or Scans Ordered:  No orders of the defined types were placed in this encounter.  Management::  >megestrol therapy to keep menstrual bleeding to a minimum  >schedule hysteroscopy uterine curettage Minerva ablation 11/10/2018   Florian Buff, MD 11/10/2018 7:11 AM

## 2018-11-10 NOTE — Discharge Instructions (Signed)
Endometrial Ablation Endometrial ablation is a procedure that destroys the thin inner layer of the lining of the uterus (endometrium). This procedure may be done:  To stop heavy periods.  To stop bleeding that is causing anemia.  To control irregular bleeding.  To treat bleeding caused by small tumors (fibroids) in the endometrium. This procedure is often an alternative to major surgery, such as removal of the uterus and cervix (hysterectomy). As a result of this procedure:  You may not be able to have children. However, if you are premenopausal (you have not gone through menopause): ? You may still have a small chance of getting pregnant. ? You will need to use a reliable method of birth control after the procedure to prevent pregnancy.  You may stop having a menstrual period, or you may have only a small amount of bleeding during your period. Menstruation may return several years after the procedure. Tell a health care provider about:  Any allergies you have.  All medicines you are taking, including vitamins, herbs, eye drops, creams, and over-the-counter medicines.  Any problems you or family members have had with the use of anesthetic medicines.  Any blood disorders you have.  Any surgeries you have had.  Any medical conditions you have. What are the risks? Generally, this is a safe procedure. However, problems may occur, including:  A hole (perforation) in the uterus or bowel.  Infection of the uterus, bladder, or vagina.  Bleeding.  Damage to other structures or organs.  An air bubble in the lung (air embolus).  Problems with pregnancy after the procedure.  Failure of the procedure.  Decreased ability to diagnose cancer in the endometrium. What happens before the procedure?  You will have tests of your endometrium to make sure there are no pre-cancerous cells or cancer cells present.  You may have an ultrasound of the uterus.  You may be given medicines to  thin the endometrium.  Ask your health care provider about: ? Changing or stopping your regular medicines. This is especially important if you take diabetes medicines or blood thinners. ? Taking medicines such as aspirin and ibuprofen. These medicines can thin your blood. Do not take these medicines before your procedure if your doctor tells you not to.  Plan to have someone take you home from the hospital or clinic. What happens during the procedure?   You will lie on an exam table with your feet and legs supported as in a pelvic exam.  To lower your risk of infection: ? Your health care team will wash or sanitize their hands and put on germ-free (sterile) gloves. ? Your genital area will be washed with soap.  An IV tube will be inserted into one of your veins.  You will be given a medicine to help you relax (sedative).  A surgical instrument with a light and camera (resectoscope) will be inserted into your vagina and moved into your uterus. This allows your surgeon to see inside your uterus.  Endometrial tissue will be removed using one of the following methods: ? Radiofrequency. This method uses a radiofrequency-alternating electric current to remove the endometrium. ? Cryotherapy. This method uses extreme cold to freeze the endometrium. ? Heated-free liquid. This method uses a heated saltwater (saline) solution to remove the endometrium. ? Microwave. This method uses high-energy microwaves to heat up the endometrium and remove it. ? Thermal balloon. This method involves inserting a catheter with a balloon tip into the uterus. The balloon tip is filled with   heated fluid to remove the endometrium. The procedure may vary among health care providers and hospitals. What happens after the procedure?  Your blood pressure, heart rate, breathing rate, and blood oxygen level will be monitored until the medicines you were given have worn off.  As tissue healing occurs, you may notice  vaginal bleeding for 4-6 weeks after the procedure. You may also experience: ? Cramps. ? Thin, watery vaginal discharge that is light pink or brown in color. ? A need to urinate more frequently than usual. ? Nausea.  Do not drive for 24 hours if you were given a sedative.  Do not have sex or insert anything into your vagina until your health care provider approves. Summary  Endometrial ablation is done to treat the many causes of heavy menstrual bleeding.  The procedure may be done only after medications have been tried to control the bleeding.  Plan to have someone take you home from the hospital or clinic. This information is not intended to replace advice given to you by your health care provider. Make sure you discuss any questions you have with your health care provider. Document Released: 11/30/2003 Document Revised: 07/07/2017 Document Reviewed: 02/07/2016 Elsevier Patient Education  2020 [employer]  

## 2018-11-10 NOTE — Transfer of Care (Signed)
Immediate Anesthesia Transfer of Care Note  Patient: Kristi Martin  Procedure(s) Performed: DILATATION AND CURETTAGE/HYSTEROSCOPY WITH MINERVA (N/A Vagina )  Patient Location: PACU  Anesthesia Type:General  Level of Consciousness: awake  Airway & Oxygen Therapy: Patient Spontanous Breathing  Post-op Assessment: Report given to RN  Post vital signs: Reviewed and stable  Last Vitals:  Vitals Value Taken Time  BP 103/60 11/10/18 0900  Temp 36.8 C 11/10/18 0839  Pulse 84 11/10/18 0901  Resp 18 11/10/18 0901  SpO2 100 % 11/10/18 0901  Vitals shown include unvalidated device data.  Last Pain:  Vitals:   11/10/18 0858  TempSrc:   PainSc: 5       Patients Stated Pain Goal: 5 ([id number])  Complications: On emergence patient"s airway very irritable, mild wheezing.  Duoneb given in recovery room and decadron, 10mg  IV.  VSS.

## 2018-11-10 NOTE — Op Note (Signed)
Preoperative diagnosis:  1.   Menometrorrhagia                                         2.  Dysmenorrhea                                         3.  Anemia   Postoperative diagnoses: Same as above   Procedure: Hysteroscopy, uterine curettage, endometrial ablation using Minerva  Surgeon: Florian Buff   Anesthesia: Laryngeal mask airway  Findings: The endometrium was normal. There were no fibroid or other abnormalities.  Description of operation: The patient was taken to the operating room and placed in the supine position. She underwent general anesthesia using the laryngeal mask airway. She was placed in the dorsal lithotomy position and prepped and draped in the usual sterile fashion. A Graves speculum was placed and the anterior cervical lip was grasped with a single-tooth tenaculum. The cervix was dilated serially to allow passage of the hysteroscope. Diagnostic hysteroscopy was performed and was found to be normal. A vigorous uterine curettage was then performed and all tissue sent to pathology for evaluation.  I then proceeded to perform the Minerva endometrial ablation.   The uterus sounded to 8 cm The handpiece was attached to the Minerva power source/machine and the handpiece passed the checklist. The array was squeezed down to remove all of the air present.  The array was then place into the endometrial cavity and deployed to a length of 5.0 cm. The handpiece confirmed appropriate width by being in the green portion of the visual dial. The cervical cuff was then inflated to the point the CO2 indicator was in the green. The endometrial integrity check was then performed and integrity sequence was confirmed x 2. The heating was then begun and carried out for a total of 2 minutes(which is standard therapy time). When the plasma cycle was finished,  the cervical cuff was deflated and the array was removed with tissue present on the silicon membrane. There was appropriate post Minerva  bleeding and uterine discharge.     All of the equipment worked well throughout the procedure.  The patient was awakened from anesthesia and taken to the recovery room in good stable condition all counts were correct. She received 3 g of Ancef and 30 mg of Toradol preoperatively. She will be discharged from the recovery room and followed up in the office in 1- 2 weeks.   She can expect 4 weeks of post procedure bloody watery discharge  Florian Buff, MD  11/10/2018 8:11 AM

## 2018-11-10 NOTE — Anesthesia Preprocedure Evaluation (Signed)
Anesthesia Evaluation  Patient identified by MRN, date of birth, ID band Patient awake    Reviewed: Allergy & Precautions, NPO status , Patient's Chart, lab work & pertinent test results  History of Anesthesia Complications Negative for: history of anesthetic complications  Airway Mallampati: II  TM Distance: >3 FB Neck ROM: Full    Dental  (+) Missing, Dental Advisory Given, Poor Dentition,    Pulmonary shortness of breath and with exertion, asthma ,    Pulmonary exam normal breath sounds clear to auscultation       Cardiovascular Exercise Tolerance: Poor METS: < 3 Mets + DOE  Normal cardiovascular exam Rhythm:Regular Rate:Normal  05-Aug-2018 17:46:10 [facility] ROUTINE RECORD Sinus rhythm Abnormal R-wave progression, early transition Minimal ST elevation, inferior leads since last tracing no significant change Confirmed by Daleen Bo [phone]) on 08/05/2018 5:50:55 PM   Neuro/Psych  Headaches, PSYCHIATRIC DISORDERS Anxiety Depression  Neuromuscular disease    GI/Hepatic GERD (not on meds, reflux today)  Poorly Controlled,(+)     substance abuse (last use 1 year ago)  cocaine use,   Endo/Other  Morbid obesity  Renal/GU Renal InsufficiencyRenal disease (as per patient kidney function has improved )     Musculoskeletal  (+) Arthritis  (back and neck pain),   Abdominal   Peds  Hematology  (+) anemia ,   Anesthesia Other Findings   Reproductive/Obstetrics                             Anesthesia Physical Anesthesia Plan  ASA: III  Anesthesia Plan: General   Post-op Pain Management:    Induction: Intravenous  PONV Risk Score and Plan: 4 or greater and Dexamethasone, Ondansetron and Midazolam  Airway Management Planned: Oral ETT  Additional Equipment:   Intra-op Plan:   Post-operative Plan: Extubation in OR  Informed Consent: I have reviewed the patients  History and Physical, chart, labs and discussed the procedure including the risks, benefits and alternatives for the proposed anesthesia with the patient or authorized representative who has indicated his/her understanding and acceptance.     Dental advisory given  Plan Discussed with: CRNA  Anesthesia Plan Comments:         Anesthesia Quick Evaluation

## 2018-11-11 ENCOUNTER — Encounter (HOSPITAL_COMMUNITY): Payer: Self-pay | Admitting: Obstetrics & Gynecology

## 2018-11-11 LAB — SURGICAL PATHOLOGY

## 2018-11-17 ENCOUNTER — Telehealth: Payer: Self-pay | Admitting: Obstetrics & Gynecology

## 2018-11-17 NOTE — Telephone Encounter (Signed)

## 2018-11-18 ENCOUNTER — Other Ambulatory Visit: Payer: Self-pay

## 2018-11-18 ENCOUNTER — Encounter: Payer: Self-pay | Admitting: Obstetrics & Gynecology

## 2018-11-18 ENCOUNTER — Ambulatory Visit (INDEPENDENT_AMBULATORY_CARE_PROVIDER_SITE_OTHER): Payer: Self-pay | Admitting: Obstetrics & Gynecology

## 2018-11-18 VITALS — BP 122/83 | HR 96 | Ht 66.0 in | Wt 273.0 lb

## 2018-11-18 DIAGNOSIS — N3941 Urge incontinence: Secondary | ICD-10-CM

## 2018-11-18 DIAGNOSIS — Z9889 Other specified postprocedural states: Secondary | ICD-10-CM

## 2018-11-18 MED ORDER — OXYBUTYNIN CHLORIDE ER 10 MG PO TB24: 10.0000 mg | ORAL_TABLET | Freq: Every day | ORAL | 11 refills | Status: DC

## 2018-11-18 NOTE — Progress Notes (Signed)
  HPI: Patient returns for routine postoperative follow-up having undergone hysteroscopy uterine curettage endometrial ablation on 11/10/2018.  The patient's immediate postoperative recovery has been unremarkable. Since hospital discharge the patient reports no surgery problems, chronic urge incontinence.   Current Outpatient Medications: amitriptyline (ELAVIL) 50 MG tablet, Take 50 mg by mouth at bedtime., Disp: , Rfl:  cholecalciferol (VITAMIN D) 1000 units tablet, Take 1,000 Units by mouth daily., Disp: , Rfl:  citalopram (CELEXA) 20 MG tablet, Take 40 mg by mouth daily. , Disp: , Rfl:   ketorolac (TORADOL) 10 MG tablet, Take 1 tablet (10 mg total) by mouth every 8 (eight) hours as needed., Disp: 15 tablet, Rfl: 0 meclizine (ANTIVERT) 25 MG tablet, Take 1 tablet (25 mg total) by mouth 3 (three) times daily as needed for dizziness., Disp: 15 tablet, Rfl: 0 Multiple Vitamin (MULTIVITAMIN WITH MINERALS) TABS tablet, Take 1 tablet by mouth daily. WOMEN'S ONE A DAY, Disp: , Rfl:  ondansetron (ZOFRAN ODT) 8 MG disintegrating tablet, Take 1 tablet (8 mg total) by mouth every 8 (eight) hours as needed for nausea or vomiting., Disp: 8 tablet, Rfl: 0 oxybutynin (DITROPAN XL) 10 MG 24 hr tablet, Take 1 tablet (10 mg total) by mouth at bedtime., Disp: 30 tablet, Rfl: 11  No current facility-administered medications for this visit.     Blood pressure 122/83, pulse 96, height 5\' 6"  (1.676 m), weight 273 lb (123.8 kg), last menstrual period 10/17/2018.  Physical Exam: Abdomen is soft Minimal vaginal discharge Uterus non tender  Diagnostic Tests:   Pathology: benign  Impression: S/p hysteroscopy uterine curettage endometrial ablation Urge incontinence/detrussor instability  Plan: Meds ordered this encounter  Medications  . oxybutynin (DITROPAN XL) 10 MG 24 hr tablet    Sig: Take 1 tablet (10 mg total) by mouth at bedtime.    Dispense:  30 tablet    Refill:  11     Follow up: prn     Florian Buff, MD

## 2018-11-22 ENCOUNTER — Other Ambulatory Visit: Payer: Self-pay | Admitting: Physician Assistant

## 2018-11-22 DIAGNOSIS — N189 Chronic kidney disease, unspecified: Secondary | ICD-10-CM

## 2018-11-22 DIAGNOSIS — D649 Anemia, unspecified: Secondary | ICD-10-CM

## 2018-11-22 DIAGNOSIS — Z13220 Encounter for screening for lipoid disorders: Secondary | ICD-10-CM

## 2018-12-06 ENCOUNTER — Ambulatory Visit: Payer: Self-pay | Admitting: Physician Assistant

## 2018-12-13 ENCOUNTER — Ambulatory Visit: Payer: Self-pay | Admitting: Physician Assistant

## 2018-12-13 ENCOUNTER — Encounter: Payer: Self-pay | Admitting: Physician Assistant

## 2018-12-13 DIAGNOSIS — E669 Obesity, unspecified: Secondary | ICD-10-CM

## 2018-12-13 DIAGNOSIS — F39 Unspecified mood [affective] disorder: Secondary | ICD-10-CM

## 2018-12-13 DIAGNOSIS — N189 Chronic kidney disease, unspecified: Secondary | ICD-10-CM

## 2018-12-13 DIAGNOSIS — D649 Anemia, unspecified: Secondary | ICD-10-CM

## 2018-12-13 NOTE — Progress Notes (Signed)
There were no vitals taken for this visit.   Subjective:    Patient ID: Kristi Martin, female    DOB: 03-25-1970, 48 y.o.   MRN: [id number]  HPI: Kristi Martin is a 48 y.o. female presenting on 12/13/2018 for No chief complaint on file.   HPI  This is a telemedicine appointment due to coronavirus pandemic.  It is via telephone as patient does not have a device with video capabilities.  I connected with  [name] on 12/13/18 by a video enabled telemedicine application and verified that I am speaking with the correct person using two identifiers.   I discussed the limitations of evaluation and management by telemedicine. The patient expressed understanding and agreed to proceed.   Patient is at home.  Provider is at office.    Patient is a 48 year old female with history of menorrhagia and mental health issues.  She says She is still going to [facility] for [place] issues.  She recently underwent hysteroscopy and ablation by Dr. Elonda Husky  for her menorrhagia and says she is doing well postoperatively.   Patient says she does not currently have Medicaid.  Patient did not get her fasting blood tests/labs done as requested.  At her recent gynecology appointment on 11/18/2018, her blood pressure was excellent at 122/83.    Patient says she is doing well today and has no complaints.   She says she is wearing a mask when she goes out in public.  She is no problems today.     Relevant past medical, surgical, family and social history reviewed and updated as indicated. Interim medical history since our last visit reviewed. Allergies and medications reviewed and updated.   Current Outpatient Medications:  .  amitriptyline (ELAVIL) 50 MG tablet, Take 50 mg by mouth at bedtime., Disp: , Rfl:  .  cholecalciferol (VITAMIN D) 1000 units tablet, Take 1,000 Units by mouth daily., Disp: , Rfl:  .  citalopram (CELEXA) 20 MG tablet, Take 40 mg by mouth daily. , Disp: , Rfl:  .   Multiple Vitamin (MULTIVITAMIN WITH MINERALS) TABS tablet, Take 1 tablet by mouth daily. WOMEN'S ONE A DAY, Disp: , Rfl:  .  ondansetron (ZOFRAN ODT) 8 MG disintegrating tablet, Take 1 tablet (8 mg total) by mouth every 8 (eight) hours as needed for nausea or vomiting., Disp: 8 tablet, Rfl: 0   Review of Systems  Per HPI unless specifically indicated above     Objective:    There were no vitals taken for this visit.  Wt Readings from Last 3 Encounters:  11/18/18 273 lb (123.8 kg)  11/10/18 271 lb (122.9 kg)  09/30/18 268 lb (121.6 kg)    Vitals with BMI 11/18/2018 11/10/2018 11/10/2018  Height 5\' 6"  - -  Weight 273 lbs - -  BMI 123XX123 - -  Systolic 123XX123 [id number] -  Diastolic 83 70 -  Pulse 96 80 79     Physical Exam Pulmonary:     Effort: No respiratory distress.  Neurological:     Mental Status: She is alert and oriented to person, place, and time.  Psychiatric:        Attention and Perception: Attention normal.        Speech: Speech normal.        Behavior: Behavior is cooperative.           Assessment & Plan:    Encounter Diagnoses  Name Primary?  Marland Kitchen Anemia, unspecified type Yes  . Chronic kidney  disease, unspecified CKD stage   . Mood disorder ([place])   . Obesity, unspecified classification, unspecified obesity type, unspecified whether serious comorbidity present      Patient is encouraged to get labs drawn this week.  She will be called with results.  Patient is encouraged to continue with treatment of mental health issues at [facility].  Screening mammogram is up-to-date.  Patient is to follow-up in 6 months.  She is to contact office sooner if needed.

## 2019-04-13 ENCOUNTER — Emergency Department (HOSPITAL_COMMUNITY)
Admission: EM | Admit: 2019-04-13 | Discharge: 2019-04-13 | Disposition: A | Discharge status: Home / Self Care | Payer: Self-pay | Attending: Emergency Medicine | Admitting: Emergency Medicine

## 2019-04-13 ENCOUNTER — Emergency Department (HOSPITAL_COMMUNITY): Payer: Self-pay

## 2019-04-13 ENCOUNTER — Other Ambulatory Visit: Payer: Self-pay

## 2019-04-13 ENCOUNTER — Encounter (HOSPITAL_COMMUNITY): Payer: Self-pay

## 2019-04-13 DIAGNOSIS — R079 Chest pain, unspecified: Secondary | ICD-10-CM

## 2019-04-13 DIAGNOSIS — R112 Nausea with vomiting, unspecified: Secondary | ICD-10-CM

## 2019-04-13 DIAGNOSIS — N183 Chronic kidney disease, stage 3 unspecified: Secondary | ICD-10-CM | POA: Insufficient documentation

## 2019-04-13 DIAGNOSIS — Z79899 Other long term (current) drug therapy: Secondary | ICD-10-CM | POA: Insufficient documentation

## 2019-04-13 DIAGNOSIS — J45909 Unspecified asthma, uncomplicated: Secondary | ICD-10-CM | POA: Insufficient documentation

## 2019-04-13 DIAGNOSIS — R05 Cough: Secondary | ICD-10-CM | POA: Insufficient documentation

## 2019-04-13 DIAGNOSIS — R6883 Chills (without fever): Secondary | ICD-10-CM | POA: Insufficient documentation

## 2019-04-13 DIAGNOSIS — R0789 Other chest pain: Secondary | ICD-10-CM | POA: Insufficient documentation

## 2019-04-13 DIAGNOSIS — Z20822 Contact with and (suspected) exposure to covid-19: Secondary | ICD-10-CM | POA: Insufficient documentation

## 2019-04-13 DIAGNOSIS — R0602 Shortness of breath: Secondary | ICD-10-CM

## 2019-04-13 DIAGNOSIS — R600 Localized edema: Secondary | ICD-10-CM | POA: Insufficient documentation

## 2019-04-13 DIAGNOSIS — Z8541 Personal history of malignant neoplasm of cervix uteri: Secondary | ICD-10-CM | POA: Insufficient documentation

## 2019-04-13 LAB — CBC WITH DIFFERENTIAL/PLATELET
Abs Immature Granulocytes: 0.01 10*3/uL (ref 0.00–0.07)
Basophils Absolute: 0 10*3/uL (ref 0.0–0.1)
Basophils Relative: 1 %
Eosinophils Absolute: 0.2 10*3/uL (ref 0.0–0.5)
Eosinophils Relative: 4 %
HCT: 36.7 % (ref 36.0–46.0)
Hemoglobin: 11.3 g/dL — ABNORMAL LOW (ref 12.0–15.0)
Immature Granulocytes: 0 %
Lymphocytes Relative: 25 %
Lymphs Abs: 1.2 10*3/uL (ref 0.7–4.0)
MCH: 26.6 pg (ref 26.0–34.0)
MCHC: 30.8 g/dL (ref 30.0–36.0)
MCV: 86.4 fL (ref 80.0–100.0)
Monocytes Absolute: 0.5 10*3/uL (ref 0.1–1.0)
Monocytes Relative: 10 %
Neutro Abs: 2.8 10*3/uL (ref 1.7–7.7)
Neutrophils Relative %: 60 %
Platelets: 317 10*3/uL (ref 150–400)
RBC: 4.25 MIL/uL (ref 3.87–5.11)
RDW: 14.8 % (ref 11.5–15.5)
WBC: 4.7 10*3/uL (ref 4.0–10.5)
nRBC: 0 % (ref 0.0–0.2)

## 2019-04-13 LAB — COMPREHENSIVE METABOLIC PANEL
ALT: 16 U/L (ref 0–44)
AST: 17 U/L (ref 15–41)
Albumin: 3.4 g/dL — ABNORMAL LOW (ref 3.5–5.0)
Alkaline Phosphatase: 76 U/L (ref 38–126)
Anion gap: 7 (ref 5–15)
BUN: 11 mg/dL (ref 6–20)
CO2: 26 mmol/L (ref 22–32)
Calcium: 8.6 mg/dL — ABNORMAL LOW (ref 8.9–10.3)
Chloride: 108 mmol/L (ref 98–111)
Creatinine, Ser: 1.23 mg/dL — ABNORMAL HIGH (ref 0.44–1.00)
GFR calc Af Amer: 59 mL/min — ABNORMAL LOW (ref >60)
GFR calc non Af Amer: 51 mL/min — ABNORMAL LOW (ref >60)
Glucose, Bld: 117 mg/dL — ABNORMAL HIGH (ref 70–99)
Potassium: 3.5 mmol/L (ref 3.5–5.1)
Sodium: 141 mmol/L (ref 135–145)
Total Bilirubin: 0.7 mg/dL (ref 0.3–1.2)
Total Protein: 6.9 g/dL (ref 6.5–8.1)

## 2019-04-13 LAB — LIPASE, BLOOD: Lipase: 27 U/L (ref 11–51)

## 2019-04-13 LAB — TROPONIN I (HIGH SENSITIVITY)
Troponin I (High Sensitivity): <2 ng/L (ref <18)
Troponin I (High Sensitivity): <2 ng/L (ref <18)

## 2019-04-13 LAB — BRAIN NATRIURETIC PEPTIDE: B Natriuretic Peptide: 36 pg/mL (ref 0.0–100.0)

## 2019-04-13 MED ORDER — METHOCARBAMOL 500 MG PO TABS
500.0000 mg | ORAL_TABLET | Freq: Once | ORAL | Status: AC
  Administered 2019-04-13: 500 mg via ORAL
  Filled 2019-04-13: qty 1

## 2019-04-13 MED ORDER — IOHEXOL 350 MG/ML SOLN
100.0000 mL | Freq: Once | INTRAVENOUS | Status: AC | PRN
  Administered 2019-04-13: 15:00:00 100 mL via INTRAVENOUS

## 2019-04-13 MED ORDER — ALBUTEROL SULFATE HFA 108 (90 BASE) MCG/ACT IN AERS: 1.0000 | INHALATION_SPRAY | Freq: Four times a day (QID) | RESPIRATORY_TRACT | 0 refills | Status: DC | PRN

## 2019-04-13 MED ORDER — LIDOCAINE 5 % EX PTCH
1.0000 | MEDICATED_PATCH | CUTANEOUS | Status: DC
  Administered 2019-04-13: 1 via TRANSDERMAL
  Filled 2019-04-13: qty 1

## 2019-04-13 MED ORDER — ONDANSETRON 4 MG PO TBDP: 4.0000 mg | ORAL_TABLET | Freq: Three times a day (TID) | ORAL | 0 refills | Status: DC | PRN

## 2019-04-13 MED ORDER — ONDANSETRON HCL 4 MG/2ML IJ SOLN
4.0000 mg | Freq: Once | INTRAMUSCULAR | Status: AC
  Administered 2019-04-13: 4 mg via INTRAVENOUS
  Filled 2019-04-13: qty 2

## 2019-04-13 MED ORDER — BENZONATATE 100 MG PO CAPS: 100.0000 mg | ORAL_CAPSULE | Freq: Three times a day (TID) | ORAL | 0 refills | Status: DC

## 2019-04-13 MED ORDER — MORPHINE SULFATE (PF) 4 MG/ML IV SOLN
4.0000 mg | Freq: Once | INTRAVENOUS | Status: AC
  Administered 2019-04-13: 13:00:00 4 mg via INTRAVENOUS
  Filled 2019-04-13: qty 1

## 2019-04-13 MED ORDER — METHOCARBAMOL 500 MG PO TABS: 500.0000 mg | ORAL_TABLET | Freq: Three times a day (TID) | ORAL | 0 refills | Status: DC | PRN

## 2019-04-13 NOTE — Discharge Instructions (Signed)
You were seen in the emergency department today for chest pain, trouble breathing, and vomiting. Your work-up in the emergency department has been overall reassuring. Your labs have been fairly normal and or similar to previous blood work you have had done-you have a degree of anemia and your kidney function was mildly elevated, please have these both rechecked by your primary care provider.. Your EKG and the enzyme we use to check your heart did not show an acute heart attack at this time. Your chest x-ray was normal.  Your CT scan did not show findings of a blood clot.  Overall we are unsure of the exact cause of your symptoms.  We are sending you home with the following medicine to help treat your symptoms: -Zofran: Take every 8 hours as needed for nausea and vomiting -Tessalon: Take every 8 hours as needed for cough - Robaxin is the muscle relaxer I have prescribed, this is meant to help with muscle tightness. Be aware that this medication may make you drowsy therefore the first time you take this it should be at a time you are in an environment where you can rest. Do not drive or operate heavy machinery when taking this medication. Do not drink alcohol or take other sedating medications with this medicine such as narcotics or benzodiazepines.  -Albuterol inhaler: Use 1 to 2 puffs every 4-6 hours as needed for trouble breathing/wheezing.  You make take Tylenol per over the counter dosing with these medications.   We have prescribed you new medication(s) today. Discuss the medications prescribed today with your pharmacist as they can have adverse effects and interactions with your other medicines including over the counter and prescribed medications. Seek medical evaluation if you start to experience new or abnormal symptoms after taking one of these medicines, seek care immediately if you start to experience difficulty breathing, feeling of your throat closing, facial swelling, or rash as these could  be indications of a more serious allergic reaction  We have tested you for COVID 19, we will call you within the next 48 hours if results are positive, you may also view these results on MyChart.   We are instructing patient's with COVID 19 or symptoms of COVID 19 to quarantine themselves for 14 days. You may be able to discontinue self quarantine if the following conditions are met:   Persons with COVID-19 who have symptoms and were directed to care for themselves at home may discontinue home isolation under the  following conditions: - It has been at least 7 days have passed since symptoms first appeared. - AND at least 3 days (72 hours) have passed since recovery defined as resolution of fever without the use of fever-reducing medications and improvement in respiratory symptoms (e.g., cough, shortness of breath)  Please follow the below quarantine instructions.   Please follow up with primary care within 3-5 days for re-evaluation- call prior to going to the office to make them aware of your symptoms as some offices are altering their method of seeing patients with COVID 19 symptoms. Return to the ER for new or worsening symptoms including but not limited to increased work of breathing, chest pain, passing out, inability to keep fluids down, or any other concerns.       Person Under Monitoring Name: Kristi Martin  Location: [address] [place] [address]   Infection Prevention Recommendations for Individuals Confirmed to have, or Being Evaluated for, 2019 Novel Coronavirus (COVID-19) Infection Who Receive Care at [facility]  Individuals who are confirmed to have, or are being evaluated for, COVID-19 should follow the prevention steps below until a healthcare provider or local or state health department says they can return to normal activities.  Stay home except to get medical care You should restrict activities outside your home, except for getting medical care. Do not go to  work, school, or public areas, and do not use public transportation or taxis.  Call ahead before visiting your doctor Before your medical appointment, call the healthcare provider and tell them that you have, or are being evaluated for, COVID-19 infection. This will help the healthcare provider's office take steps to keep other people from getting infected. Ask your healthcare provider to call the local or state health department.  Monitor your symptoms Seek prompt medical attention if your illness is worsening (e.g., difficulty breathing). Before going to your medical appointment, call the healthcare provider and tell them that you have, or are being evaluated for, COVID-19 infection. Ask your healthcare provider to call the local or state health department.  Wear a facemask You should wear a facemask that covers your nose and mouth when you are in the same room with other people and when you visit a healthcare provider. People who live with or visit you should also wear a facemask while they are in the same room with you.  Separate yourself from other people in your home As much as possible, you should stay in a different room from other people in your home. Also, you should use a separate bathroom, if available.  Avoid sharing household items You should not share dishes, drinking glasses, cups, eating utensils, towels, bedding, or other items with other people in your home. After using these items, you should wash them thoroughly with soap and water.  Cover your coughs and sneezes Cover your mouth and nose with a tissue when you cough or sneeze, or you can cough or sneeze into your sleeve. Throw used tissues in a lined trash can, and immediately wash your hands with soap and water for at least 20 seconds or use an alcohol-based hand rub.  Wash your [facility] your hands often and thoroughly with soap and water for at least 20 seconds. You can use an alcohol-based hand sanitizer if  soap and water are not available and if your hands are not visibly dirty. Avoid touching your eyes, nose, and mouth with unwashed hands.   Prevention Steps for Caregivers and Household Members of Individuals Confirmed to have, or Being Evaluated for, COVID-19 Infection Being Cared for in the Home  If you live with, or provide care at home for, a person confirmed to have, or being evaluated for, COVID-19 infection please follow these guidelines to prevent infection:  Follow healthcare provider's instructions Make sure that you understand and can help the patient follow any healthcare provider instructions for all care.  Provide for the patient's basic needs You should help the patient with basic needs in the home and provide support for getting groceries, prescriptions, and other personal needs.  Monitor the patient's symptoms If they are getting sicker, call his or her medical provider and tell them that the patient has, or is being evaluated for, COVID-19 infection. This will help the healthcare provider's office take steps to keep other people from getting infected. Ask the healthcare provider to call the local or state health department.  Limit the number of people who have contact with the patient If possible, have only one caregiver for the  patient. Other household members should stay in another home or place of residence. If this is not possible, they should stay in another room, or be separated from the patient as much as possible. Use a separate bathroom, if available. Restrict visitors who do not have an essential need to be in the home.  Keep older adults, very young children, and other sick people away from the patient Keep older adults, very young children, and those who have compromised immune systems or chronic health conditions away from the patient. This includes people with chronic heart, lung, or kidney conditions, diabetes, and cancer.  Ensure good ventilation Make  sure that shared spaces in the home have good air flow, such as from an air conditioner or an opened window, weather permitting.  Wash your hands often Wash your hands often and thoroughly with soap and water for at least 20 seconds. You can use an alcohol based hand sanitizer if soap and water are not available and if your hands are not visibly dirty. Avoid touching your eyes, nose, and mouth with unwashed hands. Use disposable paper towels to dry your hands. If not available, use dedicated cloth towels and replace them when they become wet.  Wear a facemask and gloves Wear a disposable facemask at all times in the room and gloves when you touch or have contact with the patient's blood, body fluids, and/or secretions or excretions, such as sweat, saliva, sputum, nasal mucus, vomit, urine, or feces.  Ensure the mask fits over your nose and mouth tightly, and do not touch it during use. Throw out disposable facemasks and gloves after using them. Do not reuse. Wash your hands immediately after removing your facemask and gloves. If your personal clothing becomes contaminated, carefully remove clothing and launder. Wash your hands after handling contaminated clothing. Place all used disposable facemasks, gloves, and other waste in a lined container before disposing them with other household waste. Remove gloves and wash your hands immediately after handling these items.  Do not share dishes, glasses, or other household items with the patient Avoid sharing household items. You should not share dishes, drinking glasses, cups, eating utensils, towels, bedding, or other items with a patient who is confirmed to have, or being evaluated for, COVID-19 infection. After the person uses these items, you should wash them thoroughly with soap and water.  Wash laundry thoroughly Immediately remove and wash clothes or bedding that have blood, body fluids, and/or secretions or excretions, such as sweat, saliva,  sputum, nasal mucus, vomit, urine, or feces, on them. Wear gloves when handling laundry from the patient. Read and follow directions on labels of laundry or clothing items and detergent. In general, wash and dry with the warmest temperatures recommended on the label.  Clean all areas the individual has used often Clean all touchable surfaces, such as counters, tabletops, doorknobs, bathroom fixtures, toilets, phones, keyboards, tablets, and bedside tables, every day. Also, clean any surfaces that may have blood, body fluids, and/or secretions or excretions on them. Wear gloves when cleaning surfaces the patient has come in contact with. Use a diluted bleach solution (e.g., dilute bleach with 1 part bleach and 10 parts water) or a household disinfectant with a label that says EPA-registered for coronaviruses. To make a bleach solution at home, add 1 tablespoon of bleach to 1 quart (4 cups) of water. For a larger supply, add  cup of bleach to 1 gallon (16 cups) of water. Read labels of cleaning products and follow recommendations provided on  product labels. Labels contain instructions for safe and effective use of the cleaning product including precautions you should take when applying the product, such as wearing gloves or eye protection and making sure you have good ventilation during use of the product. Remove gloves and wash hands immediately after cleaning.  Monitor yourself for signs and symptoms of illness Caregivers and household members are considered close contacts, should monitor their health, and will be asked to limit movement outside of the home to the extent possible. Follow the monitoring steps for close contacts listed on the symptom monitoring form.   ? If you have additional questions, contact your local health department or call the epidemiologist on call at [phone] (available 24/7). ? This guidance is subject to change. For the most up-to-date guidance from [facility], please  refer to their website: [email or web address]

## 2019-04-13 NOTE — ED Triage Notes (Signed)
Pt presents to ED with complaints of increased SOB since 0500, vomiting since last night. Pt states she is having left sided rib pain and it hurts to breath in and out.

## 2019-04-13 NOTE — ED Provider Notes (Signed)
[facility] Provider Note   CSN: [id number] Arrival date & time: 04/13/19  1211     History Chief Complaint  Patient presents with  . Shortness of Breath    Kristi Martin is a 49 y.o. female with a history of anxiety, anemia, asthma, cervical cancer, stage III CKD, GERD, and substance abuse who presents to the emergency department with complaints of L sided chest pain & dyspnea x 2-3 weeks. Patient states that about 2-3 weeks ago she was reaching over her head to put her shirt on when she developed quick onset pain to the L lower chest. States pain constant since onset, worse with deep breaths/coughing/movement, no alleviating factors. Reports associated dyspnea as well as dry cough. Last night pain seemed worse, she developed nausea with several episodes of emesis as well as chills. New and worsening sxs prompted ED visit.  Denies fever, hematemesis, melena, hematochezia, dysuria, unilateral leg pain/swelling, hemoptysis, recent surgery/trauma, recent long travel, hormone use, or hx of DVT/PE.  She does have history of cervical cancer in remission.   HPI     Past Medical History:  Diagnosis Date  . Anemia   . Anxiety   . Asthma    as child  . Cancer ([place])    cervical cx at age 83  . Cervical cancer ([place])   . Chronic back pain   . Chronic kidney disease   . CKD (chronic kidney disease), symptom management only, stage 3 (moderate)   . DDD (degenerative disc disease), lumbar    Facet arthritis  . GERD (gastroesophageal reflux disease)   . Hypotension   . Left knee pain   . Migraine   . Peripheral neuropathy   . Sciatica   . Substance abuse ([place])   . Vaginal Pap smear, abnormal     Patient Active Problem List   Diagnosis Date Noted  . Substance abuse ([place]) 05/27/2016  . MDD (major depressive disorder) 06/08/2015  . GAD (generalized anxiety disorder) 02/07/2015  . DDD (degenerative disc disease), lumbar 10/10/2014  . Facet arthropathy, lumbar  10/10/2014  . HGSIL (high grade squamous intraepithelial lesion) on Pap smear of cervix 08/04/2014  . Chronic back pain 07/27/2014  . GERD (gastroesophageal reflux disease) 07/27/2014  . Peripheral neuropathy 06/28/2014  . Obesity 06/28/2014  . Glucose intolerance (impaired glucose tolerance) 04/03/2014  . Chronic anemia 04/01/2014  . CKD (chronic kidney disease) stage 3, GFR 30-59 ml/min 04/01/2014    Past Surgical History:  Procedure Laterality Date  . CERVICAL CONIZATION W/BX N/A 04/01/2017   Procedure: LASER CONIZATION CERVIX WITH BIOPSY;  Surgeon: Florian Buff, MD;  Location: [facility];  Service: Gynecology;  Laterality: N/A;  . CHOLECYSTECTOMY N/A 01/20/2013   Procedure: LAPAROSCOPIC CHOLECYSTECTOMY WITH INTRAOPERATIVE CHOLANGIOGRAM;  Surgeon: Shann Medal, MD;  Location: [facility];  Service: General;  Laterality: N/A;  . DILATATION AND CURETTAGE/HYSTEROSCOPY WITH MINERVA N/A 11/10/2018   Procedure: DILATATION AND CURETTAGE/HYSTEROSCOPY WITH MINERVA;  Surgeon: Florian Buff, MD;  Location: [facility];  Service: Gynecology;  Laterality: N/A;  . TUBAL LIGATION       OB History    Gravida  9   Para  7   Term  5   Preterm  2   AB  2   Living  7     SAB  2   TAB      Ectopic      Multiple      Live Births  1  Family History  Problem Relation Age of Onset  . Cancer Mother   . Colon cancer Mother   . Breast cancer Maternal Grandmother   . Heart disease Maternal Grandmother   . Cancer Maternal Grandmother   . Breast cancer Paternal Grandmother   . Heart disease Paternal Grandmother   . Hypertension Paternal Grandmother   . Alcohol abuse Father   . Drug abuse Father   . Heart disease Father   . Asthma Brother   . Cancer Paternal Grandfather     Social History   Tobacco Use  . Smoking status: Never Smoker  . Smokeless tobacco: Never Used  Substance Use Topics  . Alcohol use: Yes    Alcohol/week: 0.0 standard drinks    Comment: occasional  .  Drug use: Not Currently    Types: "Crack" cocaine    Home Medications Prior to Admission medications   Medication Sig Start Date End Date Taking? Authorizing Provider  amitriptyline (ELAVIL) 50 MG tablet Take 50 mg by mouth at bedtime.    [provider]  cholecalciferol (VITAMIN D) 1000 units tablet Take 1,000 Units by mouth daily.    [provider]  citalopram (CELEXA) 20 MG tablet Take 40 mg by mouth daily.     [provider]  Multiple Vitamin (MULTIVITAMIN WITH MINERALS) TABS tablet Take 1 tablet by mouth daily. WOMEN'S ONE A DAY    [provider]  ondansetron (ZOFRAN ODT) 8 MG disintegrating tablet Take 1 tablet (8 mg total) by mouth every 8 (eight) hours as needed for nausea or vomiting. 11/10/18   Florian Buff, MD    Allergies    Benadryl [diphenhydramine hcl] and Onion  Review of Systems   Review of Systems  Constitutional: Positive for chills. Negative for fever.  Respiratory: Positive for cough and shortness of breath.   Cardiovascular: Positive for chest pain. Negative for leg swelling.  Gastrointestinal: Positive for nausea and vomiting. Negative for abdominal pain, blood in stool, constipation and diarrhea.  Genitourinary: Negative for dysuria.  Neurological: Negative for syncope, weakness and numbness.  All other systems reviewed and are negative.   Physical Exam Updated Vital Signs BP 131/89 (BP Location: Right Wrist)   Pulse 80   Temp 98.8 F (37.1 C) (Oral)   Resp (!) 22   Ht 5\' 6"  (1.676 m)   Wt 126.6 kg   SpO2 97%   BMI 45.03 kg/m   Physical Exam Vitals and nursing note reviewed.  Constitutional:      General: She is not in acute distress.    Appearance: She is well-developed. She is not toxic-appearing.  HENT:     Head: Normocephalic and atraumatic.  Eyes:     General:        Right eye: No discharge.        Left eye: No discharge.     Conjunctiva/sclera: Conjunctivae normal.  Cardiovascular:     Rate and  Rhythm: Normal rate and regular rhythm.     Comments: 2+ symmetric radial pulses. Pulmonary:     Effort: Pulmonary effort is normal. No respiratory distress.     Breath sounds: Normal breath sounds. No wheezing, rhonchi or rales.  Chest:     Chest wall: Tenderness (Left anterior and lateral lower chest wall without overlying skin changes, rashes, or palpable crepitus.) present.     Comments: L sided chest discomfort with LUE movement and twisting/turning of the trunk.  Abdominal:     General: There is no distension.  Palpations: Abdomen is soft.     Tenderness: There is no abdominal tenderness. There is no guarding or rebound.  Musculoskeletal:     Cervical back: Neck supple.     Comments: Trace to 1+ symmetric pitting edema to the bilateral lower legs without overlying erythema or warmth.  No palpable calf tenderness.  Skin:    General: Skin is warm and dry.     Findings: No rash.  Neurological:     Mental Status: She is alert.     Comments: Clear speech.   Psychiatric:        Behavior: Behavior normal.     ED Results / Procedures / Treatments   Labs (all labs ordered are listed, but only abnormal results are displayed) Labs Reviewed  COMPREHENSIVE METABOLIC PANEL - Abnormal; Notable for the following components:      Result Value   Glucose, Bld 117 (*)    Creatinine, Ser 1.23 (*)    Calcium 8.6 (*)    Albumin 3.4 (*)    GFR calc non Af Amer 51 (*)    GFR calc Af Amer 59 (*)    All other components within normal limits  CBC WITH DIFFERENTIAL/PLATELET - Abnormal; Notable for the following components:   Hemoglobin 11.3 (*)    All other components within normal limits  LIPASE, BLOOD  BRAIN NATRIURETIC PEPTIDE  TROPONIN I (HIGH SENSITIVITY)  TROPONIN I (HIGH SENSITIVITY)    EKG EKG Interpretation  Date/Time:  Wednesday April 13 2019 12:25:26 EST Ventricular Rate:  80 PR Interval:    QRS Duration: 66 QT Interval:  410 QTC Calculation: 473 R Axis:   38 Text  Interpretation: Sinus rhythm No significant change since prior 7/20 Confirmed by Aletta Edouard [phone]) on 04/13/2019 12:28:45 PM   Radiology DG Ribs Unilateral W/Chest Left  Result Date: 04/13/2019 CLINICAL DATA:  Chest pain EXAM: LEFT RIBS AND CHEST - 3+ VIEW COMPARISON:  August 05, 2018 FINDINGS: Frontal chest as well as oblique and cone-down rib images obtained. Lungs are clear. Heart size and pulmonary vascularity are normal. No adenopathy. No pleural effusion or pneumothorax.  No rib fracture evident. IMPRESSION: Lungs clear.  No evident rib fracture. Electronically Signed   By: Lowella Grip III M.D.   On: 04/13/2019 14:12   CT Angio Chest PE W/Cm &/Or Wo Cm  Result Date: 04/13/2019 CLINICAL DATA:  Shortness of breath, left-sided chest pain for 3 weeks EXAM: CT ANGIOGRAPHY CHEST WITH CONTRAST TECHNIQUE: Multidetector CT imaging of the chest was performed using the standard protocol during bolus administration of intravenous contrast. Multiplanar CT image reconstructions and MIPs were obtained to evaluate the vascular anatomy. CONTRAST:  172mL OMNIPAQUE IOHEXOL 350 MG/ML SOLN COMPARISON:  02/03/2016 FINDINGS: Cardiovascular: Satisfactory opacification of the pulmonary arteries to the segmental level. No evidence of pulmonary embolism. Normal heart size. No pericardial effusion. Mediastinum/Nodes: No enlarged mediastinal, hilar, or axillary lymph nodes. Thyroid gland, trachea, and esophagus demonstrate no significant findings. Lungs/Pleura: Subpleural 4 mm micro nodules within the right middle lobe and right lower lobe (series 6, images 60 and 69), unchanged from 2017 and are benign. No focal airspace consolidation, pleural effusion, or pneumothorax. No new pulmonary nodules. Upper Abdomen: No acute abnormality. Musculoskeletal: No chest wall abnormality. No acute or significant osseous findings. Review of the MIP images confirms the above findings. IMPRESSION: 1. No evidence of pulmonary embolism.  2. No acute findings within the chest. Electronically Signed   By: Davina Poke D.O.   On: 04/13/2019 15:19  Procedures Procedures (including critical care time)  Medications Ordered in ED Medications  lidocaine (LIDODERM) 5 % 1 patch (1 patch Transdermal Patch Applied 04/13/19 1540)  morphine 4 MG/ML injection 4 mg (4 mg Intravenous Given 04/13/19 1312)  ondansetron (ZOFRAN) injection 4 mg (4 mg Intravenous Given 04/13/19 1311)  iohexol (OMNIPAQUE) 350 MG/ML injection 100 mL (100 mLs Intravenous Contrast Given 04/13/19 1455)  methocarbamol (ROBAXIN) tablet 500 mg (500 mg Oral Given 04/13/19 1540)    ED Course  I have reviewed the triage vital signs and the nursing notes.  Pertinent labs & imaging results that were available during my care of the patient were reviewed by me and considered in my medical decision making (see chart for details).  Clinical Course as of Apr 13 1415  Wed Apr 12, 5725  239 49 year old female complaining of some left-sided chest pain possibly chest wall pain its been going on since earlier this morning.  Associated with some vomiting.  Patient looks visibly uncomfortable when doing any twisting or turning.  Initial troponin less than 2.  Waiting on labs and consideration for chest CT as has a history of cancer   [MB]    Clinical Course User Index [MB] Hayden Rasmussen, MD   ADIE HOECKER was evaluated in Emergency Department on 04/13/2019 for the symptoms described in the history of present illness. He/she was evaluated in the context of the global COVID-19 pandemic, which necessitated consideration that the patient might be at risk for infection with the SARS-CoV-2 virus that causes COVID-19. Institutional protocols and algorithms that pertain to the evaluation of patients at risk for COVID-19 are in a state of rapid change based on information released by regulatory bodies including the CDC and federal and state organizations. These policies and algorithms  were followed during the patient's care in the ED.  MDM Rules/Calculators/A&P                      Patient presents to the ED with complaints of 2-3 weeks of L sided chest/rib pain with dyspnea & cough with worsening sxs last night as well as development of N/V & chills. Patient nontoxic, resting comfortably without movement/palpation, vitals without significant abnormality. Heart RRR, lungs CTA- no wheezing, L chest wall tender to palpation w/ reproduction of pain with LUE movement & trunk rotation. Mild lower leg edema noted which is symmetric. EKG without significant ischemic changes, no STEMI, no significant change from prior- I personally interpreted & reviewed w/ Dr. Melina Copa.   CBC: Mild anemia similar to prior.  No leukocytosis. CMP: Mildly elevated creatinine, mildly worse from prior.  LFTs WNL.  No significant electrolyte derangement, mild hypocalcemia. Lipase: WNL Troponin: < 2, <2 CXR/rib xray: Personally interpreted and agree with radiologist impression.  No acute process.  No pneumothorax, fluid overload, rib fracture, or infiltrate.  Discussed findings & plan of care with supervising physician Dr. Melina Copa who has evaluated patient - recommends CTA of the chest which I am in agreement with- this was obtained and negative for PE or other acute process. EKG without acute ischemic changes, Troponins without elevation- doubt ACS. No signs of fluid overload on chest imaging, BNP WNL doubt acute CHF exacerbation. Imaging without infiltrate to suggest CAP. Abdomen remains without peritoneal signs. Patient tolerating PO in the ED, feeling somewhat improved, overall appears appropriate for discharge home at this time.  Unclear definitive etiology, this may be a viral process, will obtain COVID-19 swab, there may also be a degree  of musculoskeletal component to her chest pain given reproducibility with certain movements and palpation..  Will discharge home with supportive care and primary care  follow-up. I discussed results, treatment plan, need for follow-up, and return precautions with the patient. Provided opportunity for questions, patient confirmed understanding and is in agreement with plan.   This is a shared visit with supervising physician Dr. Melina Copa who has independently evaluated patient & provided guidance in evaluation/management/disposition, in agreement with care   Final Clinical Impression(s) / ED Diagnoses Final diagnoses:  Chest pain, unspecified type  Shortness of breath  Non-intractable vomiting with nausea, unspecified vomiting type    Rx / DC Orders ED Discharge Orders         Ordered    benzonatate (TESSALON) 100 MG capsule  Every 8 hours     04/13/19 1716    albuterol (VENTOLIN HFA) 108 (90 Base) MCG/ACT inhaler  Every 6 hours PRN     04/13/19 1716    methocarbamol (ROBAXIN) 500 MG tablet  Every 8 hours PRN     04/13/19 1716    ondansetron (ZOFRAN ODT) 4 MG disintegrating tablet  Every 8 hours PRN     04/13/19 1716           Amaryllis Dyke, PA-C 04/13/19 1716    Hayden Rasmussen, MD 04/13/19 [phone]

## 2019-04-14 LAB — SARS CORONAVIRUS 2 (TAT 6-24 HRS): SARS Coronavirus 2: NEGATIVE

## 2019-07-25 ENCOUNTER — Other Ambulatory Visit: Payer: Self-pay | Admitting: Physician Assistant

## 2019-07-25 DIAGNOSIS — R7309 Other abnormal glucose: Secondary | ICD-10-CM

## 2019-07-25 DIAGNOSIS — N189 Chronic kidney disease, unspecified: Secondary | ICD-10-CM

## 2019-07-25 DIAGNOSIS — D649 Anemia, unspecified: Secondary | ICD-10-CM

## 2019-07-25 DIAGNOSIS — Z13220 Encounter for screening for lipoid disorders: Secondary | ICD-10-CM

## 2019-07-25 DIAGNOSIS — Z131 Encounter for screening for diabetes mellitus: Secondary | ICD-10-CM

## 2019-07-28 ENCOUNTER — Ambulatory Visit: Payer: Self-pay | Admitting: Physician Assistant

## 2019-08-09 ENCOUNTER — Ambulatory Visit: Payer: Self-pay | Admitting: Physician Assistant

## 2019-08-16 ENCOUNTER — Other Ambulatory Visit (HOSPITAL_COMMUNITY)
Admission: RE | Admit: 2019-08-16 | Discharge: 2019-08-16 | Disposition: A | Discharge status: Home / Self Care | Payer: Self-pay | Source: Ambulatory Visit | Attending: Physician Assistant | Admitting: Physician Assistant

## 2019-08-16 ENCOUNTER — Other Ambulatory Visit: Payer: Self-pay

## 2019-08-16 DIAGNOSIS — R7309 Other abnormal glucose: Secondary | ICD-10-CM | POA: Insufficient documentation

## 2019-08-16 DIAGNOSIS — D649 Anemia, unspecified: Secondary | ICD-10-CM | POA: Insufficient documentation

## 2019-08-16 DIAGNOSIS — N189 Chronic kidney disease, unspecified: Secondary | ICD-10-CM | POA: Insufficient documentation

## 2019-08-16 DIAGNOSIS — Z13220 Encounter for screening for lipoid disorders: Secondary | ICD-10-CM | POA: Insufficient documentation

## 2019-08-16 DIAGNOSIS — Z131 Encounter for screening for diabetes mellitus: Secondary | ICD-10-CM | POA: Insufficient documentation

## 2019-08-16 LAB — COMPREHENSIVE METABOLIC PANEL
ALT: 13 U/L (ref 0–44)
AST: 13 U/L — ABNORMAL LOW (ref 15–41)
Albumin: 3.3 g/dL — ABNORMAL LOW (ref 3.5–5.0)
Alkaline Phosphatase: 80 U/L (ref 38–126)
Anion gap: 8 (ref 5–15)
BUN: 10 mg/dL (ref 6–20)
CO2: 25 mmol/L (ref 22–32)
Calcium: 8.5 mg/dL — ABNORMAL LOW (ref 8.9–10.3)
Chloride: 105 mmol/L (ref 98–111)
Creatinine, Ser: 1.08 mg/dL — ABNORMAL HIGH (ref 0.44–1.00)
GFR calc Af Amer: >60 mL/min (ref >60)
GFR calc non Af Amer: 60 mL/min — ABNORMAL LOW (ref >60)
Glucose, Bld: 118 mg/dL — ABNORMAL HIGH (ref 70–99)
Potassium: 3.5 mmol/L (ref 3.5–5.1)
Sodium: 138 mmol/L (ref 135–145)
Total Bilirubin: 0.2 mg/dL — ABNORMAL LOW (ref 0.3–1.2)
Total Protein: 6.6 g/dL (ref 6.5–8.1)

## 2019-08-16 LAB — CBC
HCT: 37.4 % (ref 36.0–46.0)
Hemoglobin: 11.5 g/dL — ABNORMAL LOW (ref 12.0–15.0)
MCH: 26 pg (ref 26.0–34.0)
MCHC: 30.7 g/dL (ref 30.0–36.0)
MCV: 84.6 fL (ref 80.0–100.0)
Platelets: 307 10*3/uL (ref 150–400)
RBC: 4.42 MIL/uL (ref 3.87–5.11)
RDW: 16.4 % — ABNORMAL HIGH (ref 11.5–15.5)
WBC: 6.8 10*3/uL (ref 4.0–10.5)
nRBC: 0 % (ref 0.0–0.2)

## 2019-08-16 LAB — LIPID PANEL
Cholesterol: 159 mg/dL (ref 0–200)
HDL: 39 mg/dL — ABNORMAL LOW (ref >40)
LDL Cholesterol: 92 mg/dL (ref 0–99)
Total CHOL/HDL Ratio: 4.1 RATIO
Triglycerides: 138 mg/dL (ref <150)
VLDL: 28 mg/dL (ref 0–40)

## 2019-08-16 LAB — HEMOGLOBIN A1C
Hgb A1c MFr Bld: 6.2 % — ABNORMAL HIGH (ref 4.8–5.6)
Mean Plasma Glucose: 131.24 mg/dL

## 2019-08-18 ENCOUNTER — Encounter: Payer: Self-pay | Admitting: Physician Assistant

## 2019-08-18 ENCOUNTER — Other Ambulatory Visit: Payer: Self-pay

## 2019-08-18 ENCOUNTER — Ambulatory Visit: Payer: Self-pay | Admitting: Physician Assistant

## 2019-08-18 VITALS — BP 100/66 | HR 85 | Temp 97.7°F | Ht 66.0 in | Wt 278.0 lb

## 2019-08-18 DIAGNOSIS — N189 Chronic kidney disease, unspecified: Secondary | ICD-10-CM

## 2019-08-18 DIAGNOSIS — R7303 Prediabetes: Secondary | ICD-10-CM

## 2019-08-18 DIAGNOSIS — Z1239 Encounter for other screening for malignant neoplasm of breast: Secondary | ICD-10-CM

## 2019-08-18 DIAGNOSIS — R32 Unspecified urinary incontinence: Secondary | ICD-10-CM

## 2019-08-18 LAB — POCT URINALYSIS DIPSTICK
Bilirubin, UA: NEGATIVE
Blood, UA: NEGATIVE
Glucose, UA: NEGATIVE
Ketones, UA: NEGATIVE
Leukocytes, UA: NEGATIVE
Nitrite, UA: NEGATIVE
Protein, UA: POSITIVE — AB
Spec Grav, UA: 1.025 (ref 1.010–1.025)
Urobilinogen, UA: 0.2 E.U./dL
pH, UA: 6 (ref 5.0–8.0)

## 2019-08-18 MED ORDER — FESOTERODINE FUMARATE ER 4 MG PO TB24: 4.0000 mg | ORAL_TABLET | Freq: Every day | ORAL | 0 refills | Status: DC

## 2019-08-18 NOTE — Progress Notes (Signed)
BP 100/66   Pulse 85   Temp 97.7 F (36.5 C)   Ht 5\' 6"  (1.676 m)   Wt 278 lb (126.1 kg)   SpO2 96%   BMI 44.87 kg/m    Subjective:    Patient ID: Kristi Martin, female    DOB: Apr 23, 1970, 49 y.o.   MRN: [id number]  HPI: Kristi Martin is a 49 y.o. female presenting on 08/18/2019 for Follow-up   HPI    Pt had a negative covid 19 screening questionnaire.     Pt is 84yoF who presents today for routine follow up  Pt got covid vaccination done  She is still going to [facility] for [place] issues  She is staying at hotel for now.  She doesn't know where she will be permanently.  She says that is stressful but she is doing better because she got out of a bad living situation.    Pt c/o incontence since her endometrial ablation surgery.  When she laughs, she pees.  She says when she gets the urge, she can't make it to the bathroom in time.  She is s/p ablation and is loving not having menses any more.   She is trying to swim for exercise and usually goes 1/week.      Relevant past medical, surgical, family and social history reviewed and updated as indicated. Interim medical history since our last visit reviewed. Allergies and medications reviewed and updated.   Current Outpatient Medications:  .  albuterol (VENTOLIN HFA) 108 (90 Base) MCG/ACT inhaler, Inhale 1-2 puffs into the lungs every 6 (six) hours as needed for wheezing or shortness of breath., Disp: 6.7 g, Rfl: 0 .  amitriptyline (ELAVIL) 50 MG tablet, Take 50 mg by mouth at bedtime., Disp: , Rfl:  .  cholecalciferol (VITAMIN D) 1000 units tablet, Take 1,000 Units by mouth daily., Disp: , Rfl:  .  citalopram (CELEXA) 20 MG tablet, Take 20 mg by mouth daily. , Disp: , Rfl:  .  Multiple Vitamin (MULTIVITAMIN WITH MINERALS) TABS tablet, Take 1 tablet by mouth daily. WOMEN'S ONE A DAY, Disp: , Rfl:      Review of Systems  Per HPI unless specifically indicated above     Objective:    BP 100/66   Pulse 85    Temp 97.7 F (36.5 C)   Ht 5\' 6"  (1.676 m)   Wt 278 lb (126.1 kg)   SpO2 96%   BMI 44.87 kg/m   Wt Readings from Last 3 Encounters:  08/18/19 278 lb (126.1 kg)  04/13/19 279 lb (126.6 kg)  11/18/18 273 lb (123.8 kg)    Physical Exam Vitals reviewed.  Constitutional:      General: She is not in acute distress.    Appearance: She is well-developed. She is not toxic-appearing.  HENT:     Head: Normocephalic and atraumatic.  Cardiovascular:     Rate and Rhythm: Normal rate and regular rhythm.  Pulmonary:     Effort: Pulmonary effort is normal.     Breath sounds: Normal breath sounds.  Abdominal:     General: Bowel sounds are normal.     Palpations: Abdomen is soft. There is no mass.     Tenderness: There is no abdominal tenderness.  Musculoskeletal:     Cervical back: Neck supple.     Right lower leg: No edema.     Left lower leg: No edema.  Lymphadenopathy:     Cervical: No cervical adenopathy.  Skin:  General: Skin is warm and dry.  Neurological:     Mental Status: She is alert and oriented to person, place, and time.  Psychiatric:        Behavior: Behavior normal.     Comments: Pt is very pleasant and appears much happier than at previous office visits     Results for orders placed or performed during the hospital encounter of 08/16/19  Hemoglobin A1c  Result Value Ref Range   Hgb A1c MFr Bld 6.2 (H) 4.8 - 5.6 %   Mean Plasma Glucose 131.24 mg/dL  Lipid panel  Result Value Ref Range   Cholesterol 159 0 - 200 mg/dL   Triglycerides 138 <150 mg/dL   HDL 39 (L) >40 mg/dL   Total CHOL/HDL Ratio 4.1 RATIO   VLDL 28 0 - 40 mg/dL   LDL Cholesterol 92 0 - 99 mg/dL  Comprehensive metabolic panel  Result Value Ref Range   Sodium 138 135 - 145 mmol/L   Potassium 3.5 3.5 - 5.1 mmol/L   Chloride 105 98 - 111 mmol/L   CO2 25 22 - 32 mmol/L   Glucose, Bld 118 (H) 70 - 99 mg/dL   BUN 10 6 - 20 mg/dL   Creatinine, Ser 1.08 (H) 0.44 - 1.00 mg/dL   Calcium 8.5 (L) 8.9 -  10.3 mg/dL   Total Protein 6.6 6.5 - 8.1 g/dL   Albumin 3.3 (L) 3.5 - 5.0 g/dL   AST 13 (L) 15 - 41 U/L   ALT 13 0 - 44 U/L   Alkaline Phosphatase 80 38 - 126 U/L   Total Bilirubin 0.2 (L) 0.3 - 1.2 mg/dL   GFR calc non Af Amer 60 (L) >60 mL/min   GFR calc Af Amer >60 >60 mL/min   Anion gap 8 5 - 15  CBC  Result Value Ref Range   WBC 6.8 4.0 - 10.5 K/uL   RBC 4.42 3.87 - 5.11 MIL/uL   Hemoglobin 11.5 (L) 12.0 - 15.0 g/dL   HCT 37.4 36 - 46 %   MCV 84.6 80.0 - 100.0 fL   MCH 26.0 26.0 - 34.0 pg   MCHC 30.7 30.0 - 36.0 g/dL   RDW 16.4 (H) 11.5 - 15.5 %   Platelets 307 150 - 400 K/uL   nRBC 0.0 0.0 - 0.2 %      Assessment & Plan:    -Reviewed labs with pt -Counseled weight with healthy diet and regular exercise -refer for screening mammogram -pt to Continue with [facility] for [facility] issues -pt was given Samples toviaz 4mg  which she will take for 21 days then check renal function.  Pt states understanding.  She will follow up in office in one month.  She is to contact office sooner prn

## 2019-08-18 NOTE — Patient Instructions (Signed)
Urinary Incontinence  Urinary incontinence refers to a condition in which a person is unable to control where and when to pass urine. A person with this condition will urinate when he or she does not mean to (involuntarily). What are the causes? This condition may be caused by:  Medicines.  Infections.  Constipation.  Overactive bladder muscles.  Weak bladder muscles.  Weak pelvic floor muscles. These muscles provide support for the bladder, intestine, and, in women, the uterus.  Enlarged prostate in men. The prostate is a gland near the bladder. When it gets too big, it can pinch the urethra. With the urethra blocked, the bladder can weaken and lose the ability to empty properly.  Surgery.  Emotional factors, such as anxiety, stress, or post-traumatic stress disorder (PTSD).  Pelvic organ prolapse. This happens in women when organs shift out of place and into the vagina. This shift can prevent the bladder and urethra from working properly. What increases the risk? The following factors may make you more likely to develop this condition:  Older age.  Obesity and physical inactivity.  Pregnancy and childbirth.  Menopause.  Diseases that affect the nerves or spinal cord (neurological diseases).  Long-term (chronic) coughing. This can increase pressure on the bladder and pelvic floor muscles. What are the signs or symptoms? Symptoms may vary depending on the type of urinary incontinence you have. They include:  A sudden urge to urinate, but passing urine involuntarily before you can get to a bathroom (urge incontinence).  Suddenly passing urine with any activity that forces urine to pass, such as coughing, laughing, exercise, or sneezing (stress incontinence).  Needing to urinate often, but urinating only a small amount, or constantly dribbling urine (overflow incontinence).  Urinating because you cannot get to the bathroom in time due to a physical disability, such as  arthritis or injury, or communication and thinking problems, such as Alzheimer disease (functional incontinence). How is this diagnosed? This condition may be diagnosed based on:  Your medical history.  A physical exam.  Tests, such as: ? Urine tests. ? X-rays of your kidney and bladder. ? Ultrasound. ? CT scan. ? Cystoscopy. In this procedure, a health care provider inserts a tube with a light and camera (cystoscope) through the urethra and into the bladder in order to check for problems. ? Urodynamic testing. These tests assess how well the bladder, urethra, and sphincter can store and release urine. There are different types of urodynamic tests, and they vary depending on what the test is measuring. To help diagnose your condition, your health care provider may recommend that you keep a log of when you urinate and how much you urinate. How is this treated? Treatment for this condition depends on the type of incontinence that you have and its cause. Treatment may include:  Lifestyle changes, such as: ? Quitting smoking. ? Maintaining a healthy weight. ? Staying active. Try to get 150 minutes of moderate-intensity exercise every week. Ask your health care provider which activities are safe for you. ? Eating a healthy diet.  Avoid high-fat foods, like fried foods.  Avoid refined carbohydrates like white bread and white rice.  Limit how much alcohol and caffeine you drink.  Increase your fiber intake. Foods such as fresh fruits, vegetables, beans, and whole grains are healthy sources of fiber.  Pelvic floor muscle exercises.  Bladder training, such as lengthening the amount of time between bathroom breaks, or using the bathroom at regular intervals.  Using techniques to suppress bladder urges.   This can include distraction techniques or controlled breathing exercises.  Medicines to relax the bladder muscles and prevent bladder spasms.  Medicines to help slow or prevent the  growth of a man's prostate.  Botox injections. These can help relax the bladder muscles.  Using pulses of electricity to help change bladder reflexes (electrical nerve stimulation).  For women, using a medical device to prevent urine leaks. This is a small, tampon-like, disposable device that is inserted into the urethra.  Injecting collagen or carbon beads (bulking agents) into the urinary sphincter. These can help thicken tissue and close the bladder opening.  Surgery. Follow these instructions at home: Lifestyle  Limit alcohol and caffeine. These can fill your bladder quickly and irritate it.  Keep yourself clean to help prevent odors and skin damage. Ask your doctor about special skin creams and cleansers that can protect the skin from urine.  Consider wearing pads or adult diapers. Make sure to change them regularly, and always change them right after experiencing incontinence. General instructions  Take over-the-counter and prescription medicines only as told by your health care provider.  Use the bathroom about every 3-4 hours, even if you do not feel the need to urinate. Try to empty your bladder completely every time. After urinating, wait a minute. Then try to urinate again.  Make sure you are in a relaxed position while urinating.  If your incontinence is caused by nerve problems, keep a log of the medicines you take and the times you go to the bathroom.  Keep all follow-up visits as told by your health care provider. This is important. Contact a health care provider if:  You have pain that gets worse.  Your incontinence gets worse. Get help right away if:  You have a fever or chills.  You are unable to urinate.  You have redness in your groin area or down your legs. Summary  Urinary incontinence refers to a condition in which a person is unable to control where and when to pass urine.  This condition may be caused by medicines, infection, weak bladder  muscles, weak pelvic floor muscles, enlargement of the prostate (in men), or surgery.  The following factors increase your risk for developing this condition: older age, obesity, pregnancy and childbirth, menopause, neurological diseases, and chronic coughing.  There are several types of urinary incontinence. They include urge incontinence, stress incontinence, overflow incontinence, and functional incontinence.  This condition is usually treated first with lifestyle and behavioral changes, such as quitting smoking, eating a healthier diet, and doing regular pelvic floor exercises. Other treatment options include medicines, bulking agents, medical devices, electrical nerve stimulation, or surgery. This information is not intended to replace advice given to you by your health care provider. Make sure you discuss any questions you have with your health care provider. Document Revised: 01/30/2017 Document Reviewed: 05/01/2016 Elsevier Patient Education  [place].  -----------------------------------------   Calorie Counting for [employer] Calories are units of energy. Your body needs a certain amount of calories from food to keep you going throughout the day. When you eat more calories than your body needs, your body stores the extra calories as fat. When you eat fewer calories than your body needs, your body burns fat to get the energy it needs. Calorie counting means keeping track of how many calories you eat and drink each day. Calorie counting can be helpful if you need to lose weight. If you make sure to eat fewer calories than your body needs,  you should lose weight. Ask your health care provider what a healthy weight is for you. For calorie counting to work, you will need to eat the right number of calories in a day in order to lose a healthy amount of weight per week. A dietitian can help you determine how many calories you need in a day and will give you suggestions on how to reach  your calorie goal.  A healthy amount of weight to lose per week is usually 1-2 lb (0.5-0.9 kg). This usually means that your daily calorie intake should be reduced by 500-750 calories.  Eating 1,200 - 1,500 calories per day can help most women lose weight.  Eating 1,500 - 1,800 calories per day can help most men lose weight. What is my plan? My goal is to have __________ calories per day. If I have this many calories per day, I should lose around __________ pounds per week. What do I need to know about calorie counting? In order to meet your daily calorie goal, you will need to:  Find out how many calories are in each food you would like to eat. Try to do this before you eat.  Decide how much of the food you plan to eat.  Write down what you ate and how many calories it had. Doing this is called keeping a food log. To successfully lose weight, it is important to balance calorie counting with a healthy lifestyle that includes regular activity. Aim for 150 minutes of moderate exercise (such as walking) or 75 minutes of vigorous exercise (such as running) each week. Where do I find calorie information?  The number of calories in a food can be found on a Nutrition Facts label. If a food does not have a Nutrition Facts label, try to look up the calories online or ask your dietitian for help. Remember that calories are listed per serving. If you choose to have more than one serving of a food, you will have to multiply the calories per serving by the amount of servings you plan to eat. For example, the label on a package of bread might say that a serving size is 1 slice and that there are 90 calories in a serving. If you eat 1 slice, you will have eaten 90 calories. If you eat 2 slices, you will have eaten 180 calories. How do I keep a food log? Immediately after each meal, record the following information in your food log:  What you ate. Don't forget to include toppings, sauces, and other extras  on the food.  How much you ate. This can be measured in cups, ounces, or number of items.  How many calories each food and drink had.  The total number of calories in the meal. Keep your food log near you, such as in a small notebook in your pocket, or use a mobile app or website. Some programs will calculate calories for you and show you how many calories you have left for the day to meet your goal. What are some calorie counting tips?   Use your calories on foods and drinks that will fill you up and not leave you hungry: ? Some examples of foods that fill you up are nuts and nut butters, vegetables, lean proteins, and high-fiber foods like whole grains. High-fiber foods are foods with more than 5 g fiber per serving. ? Drinks such as sodas, specialty coffee drinks, alcohol, and juices have a lot of calories, yet do not fill you  up.  Eat nutritious foods and avoid empty calories. Empty calories are calories you get from foods or beverages that do not have many vitamins or protein, such as candy, sweets, and soda. It is better to have a nutritious high-calorie food (such as an avocado) than a food with few nutrients (such as a bag of chips).  Know how many calories are in the foods you eat most often. This will help you calculate calorie counts faster.  Pay attention to calories in drinks. Low-calorie drinks include water and unsweetened drinks.  Pay attention to nutrition labels for "low fat" or "fat free" foods. These foods sometimes have the same amount of calories or more calories than the full fat versions. They also often have added sugar, starch, or salt, to make up for flavor that was removed with the fat.  Find a way of tracking calories that works for you. Get creative. Try different apps or programs if writing down calories does not work for you. What are some portion control tips?  Know how many calories are in a serving. This will help you know how many servings of a certain  food you can have.  Use a measuring cup to measure serving sizes. You could also try weighing out portions on a kitchen scale. With time, you will be able to estimate serving sizes for some foods.  Take some time to put servings of different foods on your favorite plates, bowls, and cups so you know what a serving looks like.  Try not to eat straight from a bag or box. Doing this can lead to overeating. Put the amount you would like to eat in a cup or on a plate to make sure you are eating the right portion.  Use smaller plates, glasses, and bowls to prevent overeating.  Try not to multitask (for example, watch TV or use your computer) while eating. If it is time to eat, sit down at a table and enjoy your food. This will help you to know when you are full. It will also help you to be aware of what you are eating and how much you are eating. What are tips for following this plan? Reading food labels  Check the calorie count compared to the serving size. The serving size may be smaller than what you are used to eating.  Check the source of the calories. Make sure the food you are eating is high in vitamins and protein and low in saturated and trans fats. Shopping  Read nutrition labels while you shop. This will help you make healthy decisions before you decide to purchase your food.  Make a grocery list and stick to it. Cooking  Try to cook your favorite foods in a healthier way. For example, try baking instead of frying.  Use low-fat dairy products. Meal planning  Use more fruits and vegetables. Half of your plate should be fruits and vegetables.  Include lean proteins like poultry and fish. How do I count calories when eating out?  Ask for smaller portion sizes.  Consider sharing an entree and sides instead of getting your own entree.  If you get your own entree, eat only half. Ask for a box at the beginning of your meal and put the rest of your entree in it so you are not  tempted to eat it.  If calories are listed on the menu, choose the lower calorie options.  Choose dishes that include vegetables, fruits, whole grains, low-fat dairy products, and  lean protein.  Choose items that are boiled, broiled, grilled, or steamed. Stay away from items that are buttered, battered, fried, or served with cream sauce. Items labeled "crispy" are usually fried, unless stated otherwise.  Choose water, low-fat milk, unsweetened iced tea, or other drinks without added sugar. If you want an alcoholic beverage, choose a lower calorie option such as a glass of wine or light beer.  Ask for dressings, sauces, and syrups on the side. These are usually high in calories, so you should limit the amount you eat.  If you want a salad, choose a garden salad and ask for grilled meats. Avoid extra toppings like bacon, cheese, or fried items. Ask for the dressing on the side, or ask for olive oil and vinegar or lemon to use as dressing.  Estimate how many servings of a food you are given. For example, a serving of cooked rice is  cup or about the size of half a baseball. Knowing serving sizes will help you be aware of how much food you are eating at restaurants. The list below tells you how big or small some common portion sizes are based on everyday objects: ? 1 oz--4 stacked dice. ? 3 oz--1 deck of cards. ? 1 tsp--1 die. ? 1 Tbsp-- a ping-pong ball. ? 2 Tbsp--1 ping-pong ball. ?  cup-- baseball. ? 1 cup--1 baseball. Summary  Calorie counting means keeping track of how many calories you eat and drink each day. If you eat fewer calories than your body needs, you should lose weight.  A healthy amount of weight to lose per week is usually 1-2 lb (0.5-0.9 kg). This usually means reducing your daily calorie intake by 500-750 calories.  The number of calories in a food can be found on a Nutrition Facts label. If a food does not have a Nutrition Facts label, try to look up the calories  online or ask your dietitian for help.  Use your calories on foods and drinks that will fill you up, and not on foods and drinks that will leave you hungry.  Use smaller plates, glasses, and bowls to prevent overeating. This information is not intended to replace advice given to you by your health care provider. Make sure you discuss any questions you have with your health care provider. Document Revised: 10/09/2017 Document Reviewed: 12/21/2015 Elsevier Patient Education  [place].

## 2019-09-07 ENCOUNTER — Other Ambulatory Visit: Payer: Self-pay | Admitting: Student

## 2019-09-07 DIAGNOSIS — Z1239 Encounter for other screening for malignant neoplasm of breast: Secondary | ICD-10-CM

## 2019-09-15 ENCOUNTER — Ambulatory Visit (HOSPITAL_COMMUNITY): Admission: RE | Admit: 2019-09-15 | Payer: Self-pay | Source: Ambulatory Visit

## 2019-09-19 ENCOUNTER — Other Ambulatory Visit (HOSPITAL_COMMUNITY)
Admission: RE | Admit: 2019-09-19 | Discharge: 2019-09-19 | Disposition: A | Discharge status: Home / Self Care | Payer: Self-pay | Source: Ambulatory Visit | Attending: Physician Assistant | Admitting: Physician Assistant

## 2019-09-19 DIAGNOSIS — N189 Chronic kidney disease, unspecified: Secondary | ICD-10-CM | POA: Insufficient documentation

## 2019-09-19 LAB — BASIC METABOLIC PANEL
Anion gap: 8 (ref 5–15)
BUN: 12 mg/dL (ref 6–20)
CO2: 23 mmol/L (ref 22–32)
Calcium: 8.4 mg/dL — ABNORMAL LOW (ref 8.9–10.3)
Chloride: 108 mmol/L (ref 98–111)
Creatinine, Ser: 1.03 mg/dL — ABNORMAL HIGH (ref 0.44–1.00)
GFR calc Af Amer: >60 mL/min (ref >60)
GFR calc non Af Amer: >60 mL/min (ref >60)
Glucose, Bld: 114 mg/dL — ABNORMAL HIGH (ref 70–99)
Potassium: 3.8 mmol/L (ref 3.5–5.1)
Sodium: 139 mmol/L (ref 135–145)

## 2019-09-20 ENCOUNTER — Ambulatory Visit: Payer: Self-pay | Admitting: Physician Assistant

## 2019-09-20 ENCOUNTER — Encounter: Payer: Self-pay | Admitting: Physician Assistant

## 2019-09-20 DIAGNOSIS — R32 Unspecified urinary incontinence: Secondary | ICD-10-CM

## 2019-09-20 DIAGNOSIS — R202 Paresthesia of skin: Secondary | ICD-10-CM

## 2019-09-20 MED ORDER — PREDNISONE 10 MG PO TABS: ORAL_TABLET | ORAL | 0 refills | Status: DC

## 2019-09-20 MED ORDER — OXYBUTYNIN CHLORIDE 5 MG PO TABS: 5.0000 mg | ORAL_TABLET | Freq: Two times a day (BID) | ORAL | 0 refills | Status: DC

## 2019-09-20 NOTE — Progress Notes (Signed)
There were no vitals taken for this visit.   Subjective:    Patient ID: Kristi Martin, female    DOB: 10/16/1970, 49 y.o.   MRN: [id number]  HPI: Kristi Martin is a 49 y.o. female presenting on 09/20/2019 for No chief complaint on file.   HPI   This is a telemedicine appointment through [employer] due to coronavirus pandemic.  I connected with  Kristi Martin on 09/20/19 by a video enabled telemedicine application and verified that I am speaking with the correct person using two identifiers.   I discussed the limitations of evaluation and management by telemedicine. The patient expressed understanding and agreed to proceed.  Pt is in her parked car somewhere.  Provider is working from home office.    Pt is 23yoF who was started on toviaz at last appointment for her incontinence.  She says the [place] didn't help her incontinence.  Also she says she Has been having tingling. L arm which usually lasts aout 10 minutes.   No sob.  Some nausea.  Mouth is watery.  If she moves her arm and shakes her hand it gets better.  She says she has pain that Goes up to neck and down to hand.  Also having pain in her neck.  This has been going about 4 days.    She tried advil without improvement.    She had unremarkalbe CT of the  c-spine in 2018.     Relevant past medical, surgical, family and social history reviewed and updated as indicated. Interim medical history since our last visit reviewed. Allergies and medications reviewed and updated.   Current Outpatient Medications:    albuterol (VENTOLIN HFA) 108 (90 Base) MCG/ACT inhaler, Inhale 1-2 puffs into the lungs every 6 (six) hours as needed for wheezing or shortness of breath., Disp: 6.7 g, Rfl: 0   amitriptyline (ELAVIL) 50 MG tablet, Take 50 mg by mouth at bedtime., Disp: , Rfl:    cholecalciferol (VITAMIN D) 1000 units tablet, Take 1,000 Units by mouth daily., Disp: , Rfl:    citalopram (CELEXA) 20 MG tablet, Take 20 mg by mouth  daily. , Disp: , Rfl:    Multiple Vitamin (MULTIVITAMIN WITH MINERALS) TABS tablet, Take 1 tablet by mouth daily. WOMEN'S ONE A DAY, Disp: , Rfl:    fesoterodine (TOVIAZ) 4 MG TB24 tablet, Take 1 tablet (4 mg total) by mouth daily. (Patient not taking: Reported on 09/20/2019), Disp: 21 tablet, Rfl: 0    Review of Systems  Per HPI unless specifically indicated above     Objective:    There were no vitals taken for this visit.  Wt Readings from Last 3 Encounters:  08/18/19 278 lb (126.1 kg)  04/13/19 279 lb (126.6 kg)  11/18/18 273 lb (123.8 kg)    Physical Exam Constitutional:      General: She is not in acute distress.    Appearance: She is obese. She is not ill-appearing.  HENT:     Head: Normocephalic and atraumatic.  Pulmonary:     Effort: No respiratory distress.  Neurological:     Mental Status: She is alert and oriented to person, place, and time.  Psychiatric:        Behavior: Behavior normal.     Results for orders placed or performed during the hospital encounter of 84/16/60  Basic metabolic panel  Result Value Ref Range   Sodium 139 135 - 145 mmol/L   Potassium 3.8 3.5 - 5.1 mmol/L  Chloride 108 98 - 111 mmol/L   CO2 23 22 - 32 mmol/L   Glucose, Bld 114 (H) 70 - 99 mg/dL   BUN 12 6 - 20 mg/dL   Creatinine, Ser 1.03 (H) 0.44 - 1.00 mg/dL   Calcium 8.4 (L) 8.9 - 10.3 mg/dL   GFR calc non Af Amer >60 >60 mL/min   GFR calc Af Amer >60 >60 mL/min   Anion gap 8 5 - 15      Assessment & Plan:   Encounter Diagnoses  Name Primary?   Urinary incontinence, unspecified type Yes   Paresthesia of left arm      -reviewed labs with pt -Trial of ditropan for incontinence -prednisone taper for arm symptoms -pt to follow up 4 wk to recheck incontinence on ditropan.  Pt to contact office sooner prn worsening or new symptoms

## 2019-10-03 ENCOUNTER — Ambulatory Visit: Payer: Self-pay | Admitting: Physician Assistant

## 2019-10-13 ENCOUNTER — Other Ambulatory Visit: Payer: Self-pay

## 2019-10-13 ENCOUNTER — Ambulatory Visit: Payer: Self-pay | Admitting: Physician Assistant

## 2019-10-13 ENCOUNTER — Encounter: Payer: Self-pay | Admitting: Physician Assistant

## 2019-10-13 VITALS — BP 106/80 | HR 102 | Temp 97.9°F | Ht 66.0 in | Wt 276.1 lb

## 2019-10-13 DIAGNOSIS — R202 Paresthesia of skin: Secondary | ICD-10-CM

## 2019-10-13 DIAGNOSIS — Z1239 Encounter for other screening for malignant neoplasm of breast: Secondary | ICD-10-CM

## 2019-10-13 DIAGNOSIS — R32 Unspecified urinary incontinence: Secondary | ICD-10-CM

## 2019-10-13 DIAGNOSIS — M542 Cervicalgia: Secondary | ICD-10-CM

## 2019-10-13 MED ORDER — CYCLOBENZAPRINE HCL 10 MG PO TABS: 10.0000 mg | ORAL_TABLET | Freq: Three times a day (TID) | ORAL | 0 refills | Status: DC | PRN

## 2019-10-13 NOTE — Progress Notes (Signed)
BP 106/80   Pulse (!) 102   Temp 97.9 F (36.6 C)   Ht 5\' 6"  (1.676 m)   Wt 276 lb 1.6 oz (125.2 kg)   SpO2 99%   BMI 44.56 kg/m    Subjective:    Patient ID: Kristi Martin, female    DOB: 01/11/1971, 49 y.o.   MRN: [id number]  HPI: Kristi Martin is a 49 y.o. female presenting on 10/13/2019 for Urinary Incontinence   HPI   Pt had a negative covid 19 screening questionnaire.   Chief Complaint  Patient presents with  . Urinary Incontinence    Trial of toviaz didn't help her incontinence.At appt 8/17 she was given rx ditropan.   She says it is not working.  She says she doesn't feel urge to urinate, she is just wet.   Sometimes she voids when she coughs.  LMP last year - had ablation last year  Pt reports that her L arm tingling is worse.  she says prednisone didn't help.   Her [employer] financial assistance expired.        Relevant past medical, surgical, family and social history reviewed and updated as indicated. Interim medical history since our last visit reviewed. Allergies and medications reviewed and updated.   Current Outpatient Medications:  .  albuterol (VENTOLIN HFA) 108 (90 Base) MCG/ACT inhaler, Inhale 1-2 puffs into the lungs every 6 (six) hours as needed for wheezing or shortness of breath., Disp: 6.7 g, Rfl: 0 .  amitriptyline (ELAVIL) 50 MG tablet, Take 50 mg by mouth at bedtime., Disp: , Rfl:  .  cholecalciferol (VITAMIN D) 1000 units tablet, Take 1,000 Units by mouth daily., Disp: , Rfl:  .  Multiple Vitamin (MULTIVITAMIN WITH MINERALS) TABS tablet, Take 1 tablet by mouth daily. WOMEN'S ONE A DAY, Disp: , Rfl:  .  oxybutynin (DITROPAN) 5 MG tablet, Take 1 tablet (5 mg total) by mouth 2 (two) times daily., Disp: 60 tablet, Rfl: 0 .  citalopram (CELEXA) 20 MG tablet, Take 20 mg by mouth daily.  (Patient not taking: Reported on 10/13/2019), Disp: , Rfl:      Review of Systems  Per HPI unless specifically indicated above      Objective:    BP 106/80   Pulse (!) 102   Temp 97.9 F (36.6 C)   Ht 5\' 6"  (1.676 m)   Wt 276 lb 1.6 oz (125.2 kg)   SpO2 99%   BMI 44.56 kg/m   Wt Readings from Last 3 Encounters:  10/13/19 276 lb 1.6 oz (125.2 kg)  08/18/19 278 lb (126.1 kg)  04/13/19 279 lb (126.6 kg)    Physical Exam Vitals reviewed.  Constitutional:      Appearance: She is well-developed.  HENT:     Head: Normocephalic and atraumatic.  Cardiovascular:     Rate and Rhythm: Normal rate and regular rhythm.  Pulmonary:     Effort: Pulmonary effort is normal.     Breath sounds: Normal breath sounds.  Abdominal:     General: Bowel sounds are normal.     Palpations: Abdomen is soft. There is no mass.     Tenderness: There is no abdominal tenderness.  Musculoskeletal:     Cervical back: Neck supple. Tenderness present. No rigidity. Muscular tenderness present. No spinous process tenderness. Normal range of motion.     Right lower leg: No edema.     Left lower leg: No edema.  Lymphadenopathy:     Cervical: No  cervical adenopathy.  Skin:    General: Skin is warm and dry.  Neurological:     Mental Status: She is alert and oriented to person, place, and time.  Psychiatric:        Behavior: Behavior normal.           Assessment & Plan:    Encounter Diagnoses  Name Primary?  . Paresthesia of left arm Yes  . Neck pain   . Urinary incontinence, unspecified type   . Morbid obesity ([place])   . Encounter for screening for malignant neoplasm of breast, unspecified screening modality       -will update Xray c-spine -pt is given cafa/application for [employer] financial assistance -Refer urologist for incontinence that has not improved with treatment -Trial of muscle relaxers. And heat for neck -refer for screening mammogram -pt to follow up 1 month.  She is to contact office sooner prn

## 2019-10-13 NOTE — Patient Instructions (Signed)
DASH Eating Plan DASH stands for "Dietary Approaches to Stop Hypertension." The DASH eating plan is a healthy eating plan that has been shown to reduce high blood pressure (hypertension). It may also reduce your risk for type 2 diabetes, heart disease, and stroke. The DASH eating plan may also help with weight loss. What are tips for following this plan?  General guidelines  Avoid eating more than 2,300 mg (milligrams) of salt (sodium) a day. If you have hypertension, you may need to reduce your sodium intake to 1,500 mg a day.  Limit alcohol intake to no more than 1 drink a day for nonpregnant women and 2 drinks a day for men. One drink equals 12 oz of beer, 5 oz of wine, or 1 oz of hard liquor.  Work with your health care provider to maintain a healthy body weight or to lose weight. Ask what an ideal weight is for you.  Get at least 30 minutes of exercise that causes your heart to beat faster (aerobic exercise) most days of the week. Activities may include walking, swimming, or biking.  Work with your health care provider or diet and nutrition specialist (dietitian) to adjust your eating plan to your individual calorie needs. Reading food labels   Check food labels for the amount of sodium per serving. Choose foods with less than 5 percent of the Daily Value of sodium. Generally, foods with less than 300 mg of sodium per serving fit into this eating plan.  To find whole grains, look for the word "whole" as the first word in the ingredient list. Shopping  Buy products labeled as "low-sodium" or "no salt added."  Buy fresh foods. Avoid canned foods and premade or frozen meals. Cooking  Avoid adding salt when cooking. Use salt-free seasonings or herbs instead of table salt or sea salt. Check with your health care provider or pharmacist before using salt substitutes.  Do not fry foods. Cook foods using healthy methods such as baking, boiling, grilling, and broiling instead.  Cook with  heart-healthy oils, such as olive, canola, soybean, or sunflower oil. Meal planning  Eat a balanced diet that includes: ? 5 or more servings of fruits and vegetables each day. At each meal, try to fill half of your plate with fruits and vegetables. ? Up to 6-8 servings of whole grains each day. ? Less than 6 oz of lean meat, poultry, or fish each day. A 3-oz serving of meat is about the same size as a deck of cards. One egg equals 1 oz. ? 2 servings of low-fat dairy each day. ? A serving of nuts, seeds, or beans 5 times each week. ? Heart-healthy fats. Healthy fats called Omega-3 fatty acids are found in foods such as flaxseeds and coldwater fish, like sardines, salmon, and mackerel.  Limit how much you eat of the following: ? Canned or prepackaged foods. ? Food that is high in trans fat, such as fried foods. ? Food that is high in saturated fat, such as fatty meat. ? Sweets, desserts, sugary drinks, and other foods with added sugar. ? Full-fat dairy products.  Do not salt foods before eating.  Try to eat at least 2 vegetarian meals each week.  Eat more home-cooked food and less restaurant, buffet, and fast food.  When eating at a restaurant, ask that your food be prepared with less salt or no salt, if possible. What foods are recommended? The items listed may not be a complete list. Talk with your dietitian about   what dietary choices are best for you. Grains Whole-grain or whole-wheat bread. Whole-grain or whole-wheat pasta. Brown rice. Oatmeal. Quinoa. Bulgur. Whole-grain and low-sodium cereals. Pita bread. Low-fat, low-sodium crackers. Whole-wheat flour tortillas. Vegetables Fresh or frozen vegetables (raw, steamed, roasted, or grilled). Low-sodium or reduced-sodium tomato and vegetable juice. Low-sodium or reduced-sodium tomato sauce and tomato paste. Low-sodium or reduced-sodium canned vegetables. Fruits All fresh, dried, or frozen fruit. Canned fruit in natural juice (without  added sugar). Meat and other protein foods Skinless chicken or turkey. Ground chicken or turkey. Pork with fat trimmed off. Fish and seafood. Egg whites. Dried beans, peas, or lentils. Unsalted nuts, nut butters, and seeds. Unsalted canned beans. Lean cuts of beef with fat trimmed off. Low-sodium, lean deli meat. Dairy Low-fat (1%) or fat-free (skim) milk. Fat-free, low-fat, or reduced-fat cheeses. Nonfat, low-sodium ricotta or cottage cheese. Low-fat or nonfat yogurt. Low-fat, low-sodium cheese. Fats and oils Soft margarine without trans fats. Vegetable oil. Low-fat, reduced-fat, or light mayonnaise and salad dressings (reduced-sodium). Canola, safflower, olive, soybean, and sunflower oils. Avocado. Seasoning and other foods Herbs. Spices. Seasoning mixes without salt. Unsalted popcorn and pretzels. Fat-free sweets. What foods are not recommended? The items listed may not be a complete list. Talk with your dietitian about what dietary choices are best for you. Grains Baked goods made with fat, such as croissants, muffins, or some breads. Dry pasta or rice meal packs. Vegetables Creamed or fried vegetables. Vegetables in a cheese sauce. Regular canned vegetables (not low-sodium or reduced-sodium). Regular canned tomato sauce and paste (not low-sodium or reduced-sodium). Regular tomato and vegetable juice (not low-sodium or reduced-sodium). Pickles. Olives. Fruits Canned fruit in a light or heavy syrup. Fried fruit. Fruit in cream or butter sauce. Meat and other protein foods Fatty cuts of meat. Ribs. Fried meat. Bacon. Sausage. Bologna and other processed lunch meats. Salami. Fatback. Hotdogs. Bratwurst. Salted nuts and seeds. Canned beans with added salt. Canned or smoked fish. Whole eggs or egg yolks. Chicken or turkey with skin. Dairy Whole or 2% milk, cream, and half-and-half. Whole or full-fat cream cheese. Whole-fat or sweetened yogurt. Full-fat cheese. Nondairy creamers. Whipped toppings.  Processed cheese and cheese spreads. Fats and oils Butter. Stick margarine. Lard. Shortening. Ghee. Bacon fat. Tropical oils, such as coconut, palm kernel, or palm oil. Seasoning and other foods Salted popcorn and pretzels. Onion salt, garlic salt, seasoned salt, table salt, and sea salt. Worcestershire sauce. Tartar sauce. Barbecue sauce. Teriyaki sauce. Soy sauce, including reduced-sodium. Steak sauce. Canned and packaged gravies. Fish sauce. Oyster sauce. Cocktail sauce. Horseradish that you find on the shelf. Ketchup. Mustard. Meat flavorings and tenderizers. Bouillon cubes. Hot sauce and Tabasco sauce. Premade or packaged marinades. Premade or packaged taco seasonings. Relishes. Regular salad dressings. Where to find more information:  National Heart, Lung, and Blood Institute: www.nhlbi.nih.gov  American Heart Association: www.heart.org Summary  The DASH eating plan is a healthy eating plan that has been shown to reduce high blood pressure (hypertension). It may also reduce your risk for type 2 diabetes, heart disease, and stroke.  With the DASH eating plan, you should limit salt (sodium) intake to 2,300 mg a day. If you have hypertension, you may need to reduce your sodium intake to 1,500 mg a day.  When on the DASH eating plan, aim to eat more fresh fruits and vegetables, whole grains, lean proteins, low-fat dairy, and heart-healthy fats.  Work with your health care provider or diet and nutrition specialist (dietitian) to adjust your eating plan to your   individual calorie needs. This information is not intended to replace advice given to you by your health care provider. Make sure you discuss any questions you have with your health care provider. Document Revised: 01/02/2017 Document Reviewed: 01/14/2016 Elsevier Patient Education  2020 [employer] Inc.  

## 2019-11-09 ENCOUNTER — Other Ambulatory Visit: Payer: Self-pay | Admitting: Student

## 2019-11-09 DIAGNOSIS — Z1239 Encounter for other screening for malignant neoplasm of breast: Secondary | ICD-10-CM

## 2019-11-10 ENCOUNTER — Ambulatory Visit: Payer: Self-pay | Admitting: Physician Assistant

## 2019-11-14 ENCOUNTER — Ambulatory Visit (HOSPITAL_COMMUNITY): Payer: Self-pay

## 2019-11-14 ENCOUNTER — Encounter (HOSPITAL_COMMUNITY): Payer: Self-pay

## 2019-11-16 ENCOUNTER — Ambulatory Visit: Payer: Self-pay | Admitting: Physician Assistant

## 2019-11-16 ENCOUNTER — Encounter: Payer: Self-pay | Admitting: Physician Assistant

## 2019-11-16 ENCOUNTER — Other Ambulatory Visit: Payer: Self-pay

## 2019-11-16 VITALS — BP 100/72 | HR 96 | Temp 97.7°F | Ht 66.0 in | Wt 275.8 lb

## 2019-11-16 DIAGNOSIS — M542 Cervicalgia: Secondary | ICD-10-CM

## 2019-11-16 DIAGNOSIS — R32 Unspecified urinary incontinence: Secondary | ICD-10-CM

## 2019-11-16 DIAGNOSIS — R202 Paresthesia of skin: Secondary | ICD-10-CM

## 2019-11-16 NOTE — Progress Notes (Signed)
BP 100/72   Pulse 96   Temp 97.7 F (36.5 C)   Ht 5\' 6"  (1.676 m)   Wt 275 lb 12.8 oz (125.1 kg)   SpO2 96%   BMI 44.52 kg/m    Subjective:    Patient ID: Kristi Martin, female    DOB: 1970-05-11, 49 y.o.   MRN: [id number]  HPI: Kristi Martin is a 49 y.o. female presenting on 11/16/2019 for Follow-up   HPI  Pt had a negative covid 19 screening questionnaire.   Pt is 49yoF here for follow up on neck pain and tingling.    She has an Upcoming appt with urologist later this month.  Pt didn't submit CAFA[facility] financial assistance application that was given to her at 10/13/19 appointment.  Pt was no-show to mammogram appt earlier this week  Pt didn't get xrays as ordered 9/9 for neck pain and paresthesias.  Pt is Still going to [facility] for [place] issues  She is still living in the motel.   It is stressful but better than the situation she was living in prior to moving into the motel.   New thing- she says her ankles are bothering her.  She says the are swelling and they hurt when they swell.  They're okay in the morning and get worse as the day progresses.       Relevant past medical, surgical, family and social history reviewed and updated as indicated. Interim medical history since our last visit reviewed. Allergies and medications reviewed and updated.   Current Outpatient Medications:  .  albuterol (VENTOLIN HFA) 108 (90 Base) MCG/ACT inhaler, Inhale 1-2 puffs into the lungs every 6 (six) hours as needed for wheezing or shortness of breath., Disp: 6.7 g, Rfl: 0 .  amitriptyline (ELAVIL) 50 MG tablet, Take 50 mg by mouth at bedtime., Disp: , Rfl:  .  cholecalciferol (VITAMIN D) 1000 units tablet, Take 1,000 Units by mouth daily., Disp: , Rfl:  .  citalopram (CELEXA) 20 MG tablet, Take 20 mg by mouth daily. , Disp: , Rfl:  .  Multiple Vitamin (MULTIVITAMIN WITH MINERALS) TABS tablet, Take 1 tablet by mouth daily. WOMEN'S ONE A DAY, Disp: , Rfl:  .   cyclobenzaprine (FLEXERIL) 10 MG tablet, Take 1 tablet (10 mg total) by mouth 3 (three) times daily as needed for muscle spasms. (Patient not taking: Reported on 11/16/2019), Disp: 30 tablet, Rfl: 0 .  oxybutynin (DITROPAN) 5 MG tablet, Take 1 tablet (5 mg total) by mouth 2 (two) times daily. (Patient not taking: Reported on 11/16/2019), Disp: 60 tablet, Rfl: 0     Review of Systems  Per HPI unless specifically indicated above     Objective:    BP 100/72   Pulse 96   Temp 97.7 F (36.5 C)   Ht 5\' 6"  (1.676 m)   Wt 275 lb 12.8 oz (125.1 kg)   SpO2 96%   BMI 44.52 kg/m   Wt Readings from Last 3 Encounters:  11/16/19 275 lb 12.8 oz (125.1 kg)  10/13/19 276 lb 1.6 oz (125.2 kg)  08/18/19 278 lb (126.1 kg)    Physical Exam Vitals reviewed.  Constitutional:      General: She is not in acute distress.    Appearance: She is well-developed. She is obese. She is not ill-appearing.  HENT:     Head: Normocephalic and atraumatic.  Cardiovascular:     Rate and Rhythm: Normal rate and regular rhythm.  Pulmonary:  Effort: Pulmonary effort is normal.     Breath sounds: Normal breath sounds.  Abdominal:     General: Bowel sounds are normal.     Palpations: Abdomen is soft. There is no mass.     Tenderness: There is no abdominal tenderness.  Musculoskeletal:     Cervical back: Neck supple.     Right lower leg: No edema.     Left lower leg: No edema.  Lymphadenopathy:     Cervical: No cervical adenopathy.  Skin:    General: Skin is warm and dry.  Neurological:     Mental Status: She is alert and oriented to person, place, and time.  Psychiatric:        Behavior: Behavior normal.          Assessment & Plan:    Encounter Diagnoses  Name Primary?  . Neck pain Yes  . Paresthesia of left arm   . Urinary incontinence, unspecified type   . Morbid obesity ([place])    ' -discussed with pt that she needs to get her neck xray done and her CAFA submitted if she wants to move  forward with investigation into neck and tingling -pt would like to wait for now and not reschedule the mammogram due to so much going on presently -counseled pt on edema.  Recommended elevation of feet when seated, regular exercise like walking and weight reduction -pt to follow up with urology as scheduled.  Urged pt to call ahead of time to cancel if she was going to be unable to make it to the appointment. -pt will be scheduled to [facility] in 1 month to review xrays and check neck/tingling

## 2019-11-28 ENCOUNTER — Ambulatory Visit (HOSPITAL_COMMUNITY)
Admission: RE | Admit: 2019-11-28 | Discharge: 2019-11-28 | Disposition: A | Discharge status: Home / Self Care | Payer: Self-pay | Source: Ambulatory Visit | Attending: Physician Assistant | Admitting: Physician Assistant

## 2019-11-28 ENCOUNTER — Other Ambulatory Visit: Payer: Self-pay

## 2019-11-28 DIAGNOSIS — R202 Paresthesia of skin: Secondary | ICD-10-CM | POA: Insufficient documentation

## 2019-11-28 DIAGNOSIS — M542 Cervicalgia: Secondary | ICD-10-CM | POA: Insufficient documentation

## 2019-12-02 ENCOUNTER — Ambulatory Visit: Payer: Self-pay | Admitting: Urology

## 2019-12-02 DIAGNOSIS — R32 Unspecified urinary incontinence: Secondary | ICD-10-CM

## 2019-12-16 ENCOUNTER — Emergency Department (HOSPITAL_COMMUNITY)
Admission: EM | Admit: 2019-12-16 | Discharge: 2019-12-16 | Disposition: A | Discharge status: Home / Self Care | Payer: Self-pay | Attending: Emergency Medicine | Admitting: Emergency Medicine

## 2019-12-16 ENCOUNTER — Other Ambulatory Visit: Payer: Self-pay

## 2019-12-16 ENCOUNTER — Ambulatory Visit: Payer: Self-pay | Admitting: Urology

## 2019-12-16 ENCOUNTER — Emergency Department (HOSPITAL_COMMUNITY): Payer: Self-pay

## 2019-12-16 ENCOUNTER — Encounter (HOSPITAL_COMMUNITY): Payer: Self-pay

## 2019-12-16 DIAGNOSIS — W19XXXA Unspecified fall, initial encounter: Secondary | ICD-10-CM

## 2019-12-16 DIAGNOSIS — R112 Nausea with vomiting, unspecified: Secondary | ICD-10-CM

## 2019-12-16 DIAGNOSIS — R1030 Lower abdominal pain, unspecified: Secondary | ICD-10-CM

## 2019-12-16 DIAGNOSIS — Z8541 Personal history of malignant neoplasm of cervix uteri: Secondary | ICD-10-CM | POA: Insufficient documentation

## 2019-12-16 DIAGNOSIS — Y92002 Bathroom of unspecified non-institutional (private) residence single-family (private) house as the place of occurrence of the external cause: Secondary | ICD-10-CM | POA: Insufficient documentation

## 2019-12-16 DIAGNOSIS — J45909 Unspecified asthma, uncomplicated: Secondary | ICD-10-CM | POA: Insufficient documentation

## 2019-12-16 DIAGNOSIS — N183 Chronic kidney disease, stage 3 unspecified: Secondary | ICD-10-CM | POA: Insufficient documentation

## 2019-12-16 DIAGNOSIS — R42 Dizziness and giddiness: Secondary | ICD-10-CM | POA: Insufficient documentation

## 2019-12-16 LAB — CBC WITH DIFFERENTIAL/PLATELET
Abs Immature Granulocytes: 0.06 10*3/uL (ref 0.00–0.07)
Basophils Absolute: 0 10*3/uL (ref 0.0–0.1)
Basophils Relative: 0 %
Eosinophils Absolute: 0 10*3/uL (ref 0.0–0.5)
Eosinophils Relative: 0 %
HCT: 44.6 % (ref 36.0–46.0)
Hemoglobin: 13.7 g/dL (ref 12.0–15.0)
Immature Granulocytes: 1 %
Lymphocytes Relative: 8 %
Lymphs Abs: 0.9 10*3/uL (ref 0.7–4.0)
MCH: 26.1 pg (ref 26.0–34.0)
MCHC: 30.7 g/dL (ref 30.0–36.0)
MCV: 85 fL (ref 80.0–100.0)
Monocytes Absolute: 0.7 10*3/uL (ref 0.1–1.0)
Monocytes Relative: 6 %
Neutro Abs: 10.5 10*3/uL — ABNORMAL HIGH (ref 1.7–7.7)
Neutrophils Relative %: 85 %
Platelets: 323 10*3/uL (ref 150–400)
RBC: 5.25 MIL/uL — ABNORMAL HIGH (ref 3.87–5.11)
RDW: 16.1 % — ABNORMAL HIGH (ref 11.5–15.5)
WBC: 12.2 10*3/uL — ABNORMAL HIGH (ref 4.0–10.5)
nRBC: 0 % (ref 0.0–0.2)

## 2019-12-16 LAB — COMPREHENSIVE METABOLIC PANEL
ALT: 13 U/L (ref 0–44)
AST: 13 U/L — ABNORMAL LOW (ref 15–41)
Albumin: 4 g/dL (ref 3.5–5.0)
Alkaline Phosphatase: 90 U/L (ref 38–126)
Anion gap: 9 (ref 5–15)
BUN: 11 mg/dL (ref 6–20)
CO2: 25 mmol/L (ref 22–32)
Calcium: 9.1 mg/dL (ref 8.9–10.3)
Chloride: 104 mmol/L (ref 98–111)
Creatinine, Ser: 1.13 mg/dL — ABNORMAL HIGH (ref 0.44–1.00)
GFR, Estimated: 59 mL/min — ABNORMAL LOW (ref >60)
Glucose, Bld: 126 mg/dL — ABNORMAL HIGH (ref 70–99)
Potassium: 3.7 mmol/L (ref 3.5–5.1)
Sodium: 138 mmol/L (ref 135–145)
Total Bilirubin: 0.4 mg/dL (ref 0.3–1.2)
Total Protein: 8.3 g/dL — ABNORMAL HIGH (ref 6.5–8.1)

## 2019-12-16 LAB — URINALYSIS, ROUTINE W REFLEX MICROSCOPIC
Bacteria, UA: NONE SEEN
Bilirubin Urine: NEGATIVE
Glucose, UA: NEGATIVE mg/dL
Ketones, ur: 5 mg/dL — AB
Leukocytes,Ua: NEGATIVE
Nitrite: NEGATIVE
Protein, ur: NEGATIVE mg/dL
Specific Gravity, Urine: 1.009 (ref 1.005–1.030)
pH: 7 (ref 5.0–8.0)

## 2019-12-16 LAB — LIPASE, BLOOD: Lipase: 24 U/L (ref 11–51)

## 2019-12-16 MED ORDER — MORPHINE SULFATE (PF) 4 MG/ML IV SOLN
4.0000 mg | Freq: Once | INTRAVENOUS | Status: AC
  Administered 2019-12-16: 4 mg via INTRAVENOUS
  Filled 2019-12-16: qty 1

## 2019-12-16 MED ORDER — IOHEXOL 300 MG/ML  SOLN
100.0000 mL | Freq: Once | INTRAMUSCULAR | Status: AC | PRN
  Administered 2019-12-16: 100 mL via INTRAVENOUS

## 2019-12-16 MED ORDER — SODIUM CHLORIDE 0.9 % IV BOLUS
500.0000 mL | Freq: Once | INTRAVENOUS | Status: AC
  Administered 2019-12-16: 500 mL via INTRAVENOUS

## 2019-12-16 MED ORDER — ONDANSETRON HCL 4 MG/2ML IJ SOLN
4.0000 mg | Freq: Once | INTRAMUSCULAR | Status: AC
  Administered 2019-12-16: 4 mg via INTRAVENOUS
  Filled 2019-12-16: qty 2

## 2019-12-16 MED ORDER — ONDANSETRON 4 MG PO TBDP: 4.0000 mg | ORAL_TABLET | Freq: Three times a day (TID) | ORAL | 0 refills | Status: DC | PRN

## 2019-12-16 MED ORDER — DICYCLOMINE HCL 20 MG PO TABS: 20.0000 mg | ORAL_TABLET | Freq: Three times a day (TID) | ORAL | 0 refills | Status: DC | PRN

## 2019-12-16 NOTE — ED Triage Notes (Signed)
Pt in to be seen for lower abdominal pain since last night and vomiting today. Pt in the waiting room and walked into the waiting room and fell while in the BR . States she got dizzy and not sure if she passed out

## 2019-12-16 NOTE — ED Notes (Signed)
Hx substance abuse   Reports severe abd pain since yesterday   No call to PCP  In ED by EMS [facility] in [place] in [place]  To back   meds given   Awaiting lab and CT abd

## 2019-12-16 NOTE — Discharge Instructions (Signed)

## 2019-12-16 NOTE — ED Provider Notes (Signed)
Emergency Department Provider Note   I have reviewed the triage vital signs and the nursing notes.   HISTORY  Chief Complaint Fall and Abdominal Pain   HPI Kristi Martin is a 49 y.o. female with past medical history reviewed below presents to the emergency department with severe abdominal pain starting last night with associated vomiting.  She describes a central, lower abdominal discomfort.  She denies dysuria, hesitancy, urgency.  She is not experiencing vaginal bleeding or discharge.  No radiation to the back or flanks.  No chest pain or shortness of breath.  She arrived to the emergency department and went to the bathroom because she felt like she needed to vomit.  She feels like she got dizzy and fell.  She does not believe she fully lost consciousness. No sick contacts. No respiratory viral symptoms.    Past Medical History:  Diagnosis Date  . Anemia   . Anxiety   . Asthma    as child  . Cancer ([place])    cervical cx at age 32  . Cervical cancer ([place])   . Chronic back pain   . Chronic kidney disease   . CKD (chronic kidney disease), symptom management only, stage 3 (moderate) ([place])   . DDD (degenerative disc disease), lumbar    Facet arthritis  . GERD (gastroesophageal reflux disease)   . Hypotension   . Left knee pain   . Migraine   . Peripheral neuropathy   . Sciatica   . Substance abuse ([place])   . Vaginal Pap smear, abnormal     Patient Active Problem List   Diagnosis Date Noted  . Substance abuse ([place]) 05/27/2016  . MDD (major depressive disorder) 06/08/2015  . GAD (generalized anxiety disorder) 02/07/2015  . DDD (degenerative disc disease), lumbar 10/10/2014  . Facet arthropathy, lumbar 10/10/2014  . HGSIL (high grade squamous intraepithelial lesion) on Pap smear of cervix 08/04/2014  . Chronic back pain 07/27/2014  . GERD (gastroesophageal reflux disease) 07/27/2014  . Peripheral neuropathy 06/28/2014  . Obesity 06/28/2014  . Glucose intolerance  (impaired glucose tolerance) 04/03/2014  . Chronic anemia 04/01/2014  . CKD (chronic kidney disease) stage 3, GFR 30-59 ml/min ([place]) 04/01/2014    Past Surgical History:  Procedure Laterality Date  . CERVICAL CONIZATION W/BX N/A 04/01/2017   Procedure: LASER CONIZATION CERVIX WITH BIOPSY;  Surgeon: Florian Buff, MD;  Location: [facility];  Service: Gynecology;  Laterality: N/A;  . CHOLECYSTECTOMY N/A 01/20/2013   Procedure: LAPAROSCOPIC CHOLECYSTECTOMY WITH INTRAOPERATIVE CHOLANGIOGRAM;  Surgeon: Shann Medal, MD;  Location: [facility];  Service: General;  Laterality: N/A;  . DILATATION AND CURETTAGE/HYSTEROSCOPY WITH MINERVA N/A 11/10/2018   Procedure: DILATATION AND CURETTAGE/HYSTEROSCOPY WITH MINERVA;  Surgeon: Florian Buff, MD;  Location: [facility];  Service: Gynecology;  Laterality: N/A;  . TUBAL LIGATION      Allergies Benadryl [diphenhydramine hcl] and Onion  Family History  Problem Relation Age of Onset  . Cancer Mother   . Colon cancer Mother   . Breast cancer Maternal Grandmother   . Heart disease Maternal Grandmother   . Cancer Maternal Grandmother   . Breast cancer Paternal Grandmother   . Heart disease Paternal Grandmother   . Hypertension Paternal Grandmother   . Alcohol abuse Father   . Drug abuse Father   . Heart disease Father   . Asthma Brother   . Cancer Paternal Grandfather     Social History Social History   Tobacco Use  . Smoking status: Never  Smoker  . Smokeless tobacco: Never Used  Vaping Use  . Vaping Use: Never used  Substance Use Topics  . Alcohol use: Yes    Alcohol/week: 0.0 standard drinks    Comment: occasional  . Drug use: Not Currently    Types: "Crack" cocaine    Review of Systems  Constitutional: No fever/chills. Positive lightheadedness and fall.  Eyes: No visual changes. ENT: No sore throat. Cardiovascular: Denies chest pain. Respiratory: Denies shortness of breath. Gastrointestinal: Positive lower abdominal pain. Positive  nausea and vomiting.  No diarrhea.  No constipation. Genitourinary: Negative for dysuria. Musculoskeletal: Negative for back pain. Skin: Negative for rash. Neurological: Negative for focal weakness or numbness. Positive HA since fall.   10-point ROS otherwise negative.  ____________________________________________   PHYSICAL EXAM:  VITAL SIGNS: ED Triage Vitals  Enc Vitals Group     BP 12/16/19 1704 (!) 141/106     Pulse Rate 12/16/19 1704 94     Resp 12/16/19 1704 20     Temp 12/16/19 1704 98.8 F (37.1 C)     Temp Source 12/16/19 1704 Oral     SpO2 12/16/19 1704 97 %     Weight 12/16/19 1703 270 lb (122.5 kg)     Height 12/16/19 1703 5\' 6"  (1.676 m)   Constitutional: Alert and oriented. Patient laying on her right side in stretcher hallway. Appears uncomfortable but able to provide a history and cooperate with exam.  Eyes: Conjunctivae are normal.  Head: Atraumatic. Nose: No congestion/rhinnorhea. Mouth/Throat: Mucous membranes are moist.   Neck: No stridor.   Cardiovascular: Normal rate, regular rhythm. Good peripheral circulation. Grossly normal heart sounds.   Respiratory: Normal respiratory effort.  No retractions. Lungs CTAB. Gastrointestinal: Soft with diffuse tenderness throughout slightly worse throughout the lower abdomen. No focal tenderness, rebound, or guarding. No distention.  Musculoskeletal: No lower extremity tenderness nor edema. No gross deformities of extremities. Neurologic:  Normal speech and language. Moving extremities equally.  Skin:  Skin is warm, dry and intact. No rash noted.  ____________________________________________   LABS (all labs ordered are listed, but only abnormal results are displayed)  Labs Reviewed  COMPREHENSIVE METABOLIC PANEL - Abnormal; Notable for the following components:      Result Value   Glucose, Bld 126 (*)    Creatinine, Ser 1.13 (*)    Total Protein 8.3 (*)    AST 13 (*)    GFR, Estimated 59 (*)    All other  components within normal limits  CBC WITH DIFFERENTIAL/PLATELET - Abnormal; Notable for the following components:   WBC 12.2 (*)    RBC 5.25 (*)    RDW 16.1 (*)    Neutro Abs 10.5 (*)    All other components within normal limits  URINALYSIS, ROUTINE W REFLEX MICROSCOPIC - Abnormal; Notable for the following components:   Color, Urine STRAW (*)    Hgb urine dipstick SMALL (*)    Ketones, ur 5 (*)    All other components within normal limits  URINE CULTURE  LIPASE, BLOOD   ____________________________________________  EKG   EKG Interpretation  Date/Time:  Friday December 16 2019 17:45:49 EST Ventricular Rate:  89 PR Interval:    QRS Duration: 71 QT Interval:  370 QTC Calculation: 451 R Axis:   56 Text Interpretation: Sinus rhythm No significant change since last tracing No STEMI Confirmed by Nanda Quinton [phone]) on 12/16/2019 5:48:19 PM       ____________________________________________  RADIOLOGY  CT Head Wo Contrast  Result Date:  12/16/2019 CLINICAL DATA:  49 year old female with head trauma. EXAM: CT HEAD WITHOUT CONTRAST TECHNIQUE: Contiguous axial images were obtained from the base of the skull through the vertex without intravenous contrast. COMPARISON:  Head CT dated 08/05/2018. FINDINGS: Brain: The ventricles and sulci appropriate size for patient's age. The gray-white matter discrimination is preserved. There is no acute intracranial hemorrhage. No mass effect midline shift. No extra-axial fluid collection. Vascular: No hyperdense vessel or unexpected calcification. Skull: Normal. Negative for fracture or focal lesion. Sinuses/Orbits: No acute finding. Other: None IMPRESSION: Unremarkable noncontrast CT of the brain. Electronically Signed   By: Anner Crete M.D.   On: 12/16/2019 20:51   CT ABDOMEN PELVIS W CONTRAST  Result Date: 12/16/2019 CLINICAL DATA:  Lower abdominal pain, vomiting, dizziness EXAM: CT ABDOMEN AND PELVIS WITH CONTRAST TECHNIQUE:  Multidetector CT imaging of the abdomen and pelvis was performed using the standard protocol following bolus administration of intravenous contrast. CONTRAST:  163mL OMNIPAQUE IOHEXOL 300 MG/ML  SOLN COMPARISON:  07/24/2018 FINDINGS: Lower chest: No acute pleural or parenchymal lung disease. Hepatobiliary: No focal liver abnormality is seen. Status post cholecystectomy. No biliary dilatation. Pancreas: Unremarkable. No pancreatic ductal dilatation or surrounding inflammatory changes. Spleen: Normal in size without focal abnormality. Adrenals/Urinary Tract: Adrenal glands are unremarkable. Kidneys are normal, without renal calculi, focal lesion, or hydronephrosis. Bladder is unremarkable. Stomach/Bowel: No bowel obstruction or ileus. Minimal diverticulosis of the descending colon. Normal appendix right lower quadrant. No bowel wall thickening or inflammatory change. Vascular/Lymphatic: No significant vascular findings are present. No enlarged abdominal or pelvic lymph nodes. Reproductive: There is fluid within the endometrial cavity, please correlate with menstrual history. Normal follicles within the ovaries. Other: No free fluid or free gas. No abdominal wall hernia. Musculoskeletal: No acute or destructive bony lesions. Reconstructed images demonstrate no additional findings. IMPRESSION: 1. No acute intra-abdominal or intrapelvic process. 2. Minimal diverticulosis without diverticulitis. 3. Fluid within the endometrial cavity, please correlate with menstrual history. Further evaluation with pelvic ultrasound may be useful. Electronically Signed   By: Randa Ngo M.D.   On: 12/16/2019 20:55    ____________________________________________   PROCEDURES  Procedure(s) performed:   Procedures  none ____________________________________________   INITIAL IMPRESSION / ASSESSMENT AND PLAN / ED COURSE  Pertinent labs & imaging results that were available during my care of the patient were reviewed by me  and considered in my medical decision making (see chart for details).   Patient presents to the emergency department evaluation of abdominal pain with vomiting.  She became lightheaded and fell while in the emergency department.  Did not see outward sign of head trauma or focal neurologic deficits.  She is complaining now severe headache and so plan for CT imaging of the head as well as abdomen pelvis.  Patient has not had similar ED visits in the past upon my brief chart review.  No peritoneal findings.  No UTI symptoms.  Patient has had a hysterectomy.   09:25 PM  CT imaging and lab work reviewed.  No acute findings.  EKG interpreted by me as above with no acute ischemic findings or other arrhythmias.  Nonspecific findings of fluid in the endometrial cavity.  Patient is not having vaginal bleeding or discharge symptoms.  Plan for supportive care and fluids to advance diet as tolerated.  Patient to call her PCP on Monday for follow-up appointment.  Discussed ED return precautions.  Patient is feeling much better and comfortable with the plan at discharge. ____________________________________________  FINAL CLINICAL IMPRESSION(S) /  ED DIAGNOSES  Final diagnoses:  Lower abdominal pain  Non-intractable vomiting with nausea, unspecified vomiting type  Fall, initial encounter     MEDICATIONS GIVEN DURING THIS VISIT:  Medications  sodium chloride 0.9 % bolus 500 mL (500 mLs Intravenous New Bag/Given 12/16/19 1833)  morphine 4 MG/ML injection 4 mg (4 mg Intravenous Given 12/16/19 1834)  ondansetron (ZOFRAN) injection 4 mg (4 mg Intravenous Given 12/16/19 1835)  iohexol (OMNIPAQUE) 300 MG/ML solution 100 mL (100 mLs Intravenous Contrast Given 12/16/19 2009)     NEW OUTPATIENT MEDICATIONS STARTED DURING THIS VISIT:  New Prescriptions   DICYCLOMINE (BENTYL) 20 MG TABLET    Take 1 tablet (20 mg total) by mouth 3 (three) times daily as needed for spasms (abdominal cramping).   ONDANSETRON (ZOFRAN  ODT) 4 MG DISINTEGRATING TABLET    Take 1 tablet (4 mg total) by mouth every 8 (eight) hours as needed.    Note:  This document was prepared using Dragon voice recognition software and may include unintentional dictation errors.  Nanda Quinton, MD, [facility] Emergency Medicine    Carlus Stay, [facility], MD 12/17/19 1235

## 2019-12-17 ENCOUNTER — Encounter (HOSPITAL_COMMUNITY): Payer: Self-pay | Admitting: *Deleted

## 2019-12-17 ENCOUNTER — Emergency Department (HOSPITAL_COMMUNITY): Payer: Medicaid Other

## 2019-12-17 ENCOUNTER — Emergency Department (HOSPITAL_COMMUNITY)
Admission: EM | Admit: 2019-12-17 | Discharge: 2019-12-17 | Disposition: A | Discharge status: Home / Self Care | Payer: Medicaid Other | Attending: Emergency Medicine | Admitting: Emergency Medicine

## 2019-12-17 DIAGNOSIS — Z86001 Personal history of in-situ neoplasm of cervix uteri: Secondary | ICD-10-CM | POA: Insufficient documentation

## 2019-12-17 DIAGNOSIS — Z79899 Other long term (current) drug therapy: Secondary | ICD-10-CM | POA: Insufficient documentation

## 2019-12-17 DIAGNOSIS — N183 Chronic kidney disease, stage 3 unspecified: Secondary | ICD-10-CM | POA: Insufficient documentation

## 2019-12-17 DIAGNOSIS — J45909 Unspecified asthma, uncomplicated: Secondary | ICD-10-CM | POA: Insufficient documentation

## 2019-12-17 DIAGNOSIS — R102 Pelvic and perineal pain: Secondary | ICD-10-CM | POA: Insufficient documentation

## 2019-12-17 LAB — COMPREHENSIVE METABOLIC PANEL
ALT: 85 U/L — ABNORMAL HIGH (ref 0–44)
AST: 120 U/L — ABNORMAL HIGH (ref 15–41)
Albumin: 3.6 g/dL (ref 3.5–5.0)
Alkaline Phosphatase: 128 U/L — ABNORMAL HIGH (ref 38–126)
Anion gap: 8 (ref 5–15)
BUN: 11 mg/dL (ref 6–20)
CO2: 27 mmol/L (ref 22–32)
Calcium: 8.8 mg/dL — ABNORMAL LOW (ref 8.9–10.3)
Chloride: 103 mmol/L (ref 98–111)
Creatinine, Ser: 1.06 mg/dL — ABNORMAL HIGH (ref 0.44–1.00)
GFR, Estimated: >60 mL/min (ref >60)
Glucose, Bld: 131 mg/dL — ABNORMAL HIGH (ref 70–99)
Potassium: 3.5 mmol/L (ref 3.5–5.1)
Sodium: 138 mmol/L (ref 135–145)
Total Bilirubin: 0.4 mg/dL (ref 0.3–1.2)
Total Protein: 7.7 g/dL (ref 6.5–8.1)

## 2019-12-17 LAB — CBC
HCT: 39.9 % (ref 36.0–46.0)
Hemoglobin: 12.3 g/dL (ref 12.0–15.0)
MCH: 26.3 pg (ref 26.0–34.0)
MCHC: 30.8 g/dL (ref 30.0–36.0)
MCV: 85.3 fL (ref 80.0–100.0)
Platelets: 292 10*3/uL (ref 150–400)
RBC: 4.68 MIL/uL (ref 3.87–5.11)
RDW: 16.4 % — ABNORMAL HIGH (ref 11.5–15.5)
WBC: 8.4 10*3/uL (ref 4.0–10.5)
nRBC: 0 % (ref 0.0–0.2)

## 2019-12-17 LAB — RAPID URINE DRUG SCREEN, HOSP PERFORMED
Amphetamines: NOT DETECTED
Barbiturates: NOT DETECTED
Benzodiazepines: NOT DETECTED
Cocaine: POSITIVE — AB
Opiates: POSITIVE — AB
Tetrahydrocannabinol: NOT DETECTED

## 2019-12-17 LAB — URINALYSIS, ROUTINE W REFLEX MICROSCOPIC
Bacteria, UA: NONE SEEN
Bilirubin Urine: NEGATIVE
Glucose, UA: NEGATIVE mg/dL
Ketones, ur: NEGATIVE mg/dL
Leukocytes,Ua: NEGATIVE
Nitrite: NEGATIVE
Protein, ur: 30 mg/dL — AB
Specific Gravity, Urine: 1.021 (ref 1.005–1.030)
pH: 6 (ref 5.0–8.0)

## 2019-12-17 LAB — WET PREP, GENITAL
Sperm: NONE SEEN
Trich, Wet Prep: NONE SEEN
Yeast Wet Prep HPF POC: NONE SEEN

## 2019-12-17 LAB — LIPASE, BLOOD: Lipase: 25 U/L (ref 11–51)

## 2019-12-17 MED ORDER — DOXYCYCLINE HYCLATE 100 MG PO CAPS: 100.0000 mg | ORAL_CAPSULE | Freq: Two times a day (BID) | ORAL | 0 refills | Status: DC

## 2019-12-17 MED ORDER — METRONIDAZOLE 500 MG PO TABS: 500.0000 mg | ORAL_TABLET | Freq: Two times a day (BID) | ORAL | 0 refills | Status: DC

## 2019-12-17 MED ORDER — SODIUM CHLORIDE 0.9 % IV SOLN
100.0000 mg | Freq: Once | INTRAVENOUS | Status: AC
  Administered 2019-12-17: 100 mg via INTRAVENOUS
  Filled 2019-12-17 (×2): qty 100

## 2019-12-17 MED ORDER — HYDROMORPHONE HCL 1 MG/ML IJ SOLN
1.0000 mg | Freq: Once | INTRAMUSCULAR | Status: AC
  Administered 2019-12-17: 1 mg via INTRAVENOUS
  Filled 2019-12-17: qty 1

## 2019-12-17 MED ORDER — HYDROMORPHONE HCL 1 MG/ML IJ SOLN
0.5000 mg | Freq: Once | INTRAMUSCULAR | Status: AC
  Administered 2019-12-17: 0.5 mg via INTRAVENOUS
  Filled 2019-12-17: qty 1

## 2019-12-17 MED ORDER — SODIUM CHLORIDE 0.9 % IV BOLUS
1000.0000 mL | Freq: Once | INTRAVENOUS | Status: AC
  Administered 2019-12-17: 1000 mL via INTRAVENOUS

## 2019-12-17 MED ORDER — ONDANSETRON HCL 4 MG/2ML IJ SOLN
4.0000 mg | Freq: Once | INTRAMUSCULAR | Status: AC
  Administered 2019-12-17: 4 mg via INTRAVENOUS
  Filled 2019-12-17: qty 2

## 2019-12-17 MED ORDER — SODIUM CHLORIDE 0.9 % IV SOLN
2.0000 g | Freq: Once | INTRAVENOUS | Status: AC
  Administered 2019-12-17: 2 g via INTRAVENOUS
  Filled 2019-12-17: qty 20

## 2019-12-17 NOTE — ED Notes (Signed)
I assumed care of this patient at this time. At the time of assuming care, pt arrives to assigned bed, required x2 person assist to transfer from wheelchair to bed.Pt is restless, moaning, and gripping stomach. Bed is locked in the lowest position, side rails x2, call bell within reach. This RN attempted and assessment but pt is rolling and not answering questions verbally.

## 2019-12-17 NOTE — Discharge Instructions (Addendum)
It was our pleasure to provide your ER care today - we hope that you feel better.  Take antibiotics (doxycycline and flagyl) as prescribed.   Take acetaminophen or ibuprofen as need.  Your ct scan was read as showing: Distention of the endometrial canal of the uterus, measuring approximately 2 cm, etiology uncertain. Further evaluation with pelvic ultrasound is recommended.  Return tomorrow AM from 8a-9a for pelvic ultrasound, and re-check.  Return to ER right away if worse, new symptoms, high fevers, worsening or intractable pain, persistent vomiting, or other concern.   You were given pain medication in the ER - no driving for the next 6 hours.

## 2019-12-17 NOTE — ED Notes (Signed)
Pt transported to CT by CT staff at this time.

## 2019-12-17 NOTE — ED Notes (Signed)
Pt transported to room for Pelvic exam at this time.

## 2019-12-17 NOTE — ED Provider Notes (Signed)
[facility] EMERGENCY DEPARTMENT Provider Note   CSN: [id number] Arrival date & time: 12/17/19  1509     History Chief Complaint  Patient presents with  . Abdominal Pain    Kristi Martin is a 49 y.o. female.  Patient c/o lower abd pain for the past two days. Symptoms acute onset, moderate-severe, constant, persistent, non radiating. No back or flank pain. No dysuria or hematuria. No vaginal discharge or bleeding. No trauma or fall. Remote hx cholecystectomy. No hx kidney stones. Denies fever or chills. Normal appetite. No nausea/vomiting. Having normal bms.   The history is provided by the patient.  Abdominal Pain Associated symptoms: no chest pain, no cough, no diarrhea, no dysuria, no fever, no hematuria, no shortness of breath, no sore throat and no vomiting        Past Medical History:  Diagnosis Date  . Anemia   . Anxiety   . Asthma    as child  . Cancer ([place])    cervical cx at age 52  . Cervical cancer ([place])   . Chronic back pain   . Chronic kidney disease   . CKD (chronic kidney disease), symptom management only, stage 3 (moderate) ([place])   . DDD (degenerative disc disease), lumbar    Facet arthritis  . GERD (gastroesophageal reflux disease)   . Hypotension   . Left knee pain   . Migraine   . Peripheral neuropathy   . Sciatica   . Substance abuse ([place])   . Vaginal Pap smear, abnormal     Patient Active Problem List   Diagnosis Date Noted  . Substance abuse ([place]) 05/27/2016  . MDD (major depressive disorder) 06/08/2015  . GAD (generalized anxiety disorder) 02/07/2015  . DDD (degenerative disc disease), lumbar 10/10/2014  . Facet arthropathy, lumbar 10/10/2014  . HGSIL (high grade squamous intraepithelial lesion) on Pap smear of cervix 08/04/2014  . Chronic back pain 07/27/2014  . GERD (gastroesophageal reflux disease) 07/27/2014  . Peripheral neuropathy 06/28/2014  . Obesity 06/28/2014  . Glucose intolerance (impaired glucose tolerance) 04/03/2014   . Chronic anemia 04/01/2014  . CKD (chronic kidney disease) stage 3, GFR 30-59 ml/min ([place]) 04/01/2014    Past Surgical History:  Procedure Laterality Date  . CERVICAL CONIZATION W/BX N/A 04/01/2017   Procedure: LASER CONIZATION CERVIX WITH BIOPSY;  Surgeon: Florian Buff, MD;  Location: [facility];  Service: Gynecology;  Laterality: N/A;  . CHOLECYSTECTOMY N/A 01/20/2013   Procedure: LAPAROSCOPIC CHOLECYSTECTOMY WITH INTRAOPERATIVE CHOLANGIOGRAM;  Surgeon: Shann Medal, MD;  Location: [facility];  Service: General;  Laterality: N/A;  . DILATATION AND CURETTAGE/HYSTEROSCOPY WITH MINERVA N/A 11/10/2018   Procedure: DILATATION AND CURETTAGE/HYSTEROSCOPY WITH MINERVA;  Surgeon: Florian Buff, MD;  Location: [facility];  Service: Gynecology;  Laterality: N/A;  . TUBAL LIGATION       OB History    Gravida  9   Para  7   Term  5   Preterm  2   AB  2   Living  7     SAB  2   TAB      Ectopic      Multiple      Live Births  1           Family History  Problem Relation Age of Onset  . Cancer Mother   . Colon cancer Mother   . Breast cancer Maternal Grandmother   . Heart disease Maternal Grandmother   . Cancer Maternal Grandmother   .  Breast cancer Paternal Grandmother   . Heart disease Paternal Grandmother   . Hypertension Paternal Grandmother   . Alcohol abuse Father   . Drug abuse Father   . Heart disease Father   . Asthma Brother   . Cancer Paternal Grandfather     Social History   Tobacco Use  . Smoking status: Never Smoker  . Smokeless tobacco: Never Used  Vaping Use  . Vaping Use: Never used  Substance Use Topics  . Alcohol use: Yes    Alcohol/week: 0.0 standard drinks    Comment: occasional  . Drug use: Not Currently    Types: "Crack" cocaine    Home Medications Prior to Admission medications   Medication Sig Start Date End Date Taking? Authorizing Provider  albuterol (VENTOLIN HFA) 108 (90 Base) MCG/ACT inhaler Inhale 1-2 puffs into the lungs  every 6 (six) hours as needed for wheezing or shortness of breath. 04/13/19   Petrucelli, Samantha R, PA-C  amitriptyline (ELAVIL) 50 MG tablet Take 50 mg by mouth at bedtime.    [provider]  cholecalciferol (VITAMIN D) 1000 units tablet Take 1,000 Units by mouth daily.    [provider]  cyclobenzaprine (FLEXERIL) 10 MG tablet Take 1 tablet (10 mg total) by mouth 3 (three) times daily as needed for muscle spasms. Patient not taking: Reported on 11/16/2019 10/13/19   Soyla Dryer, PA-C  dicyclomine (BENTYL) 20 MG tablet Take 1 tablet (20 mg total) by mouth 3 (three) times daily as needed for spasms (abdominal cramping). 12/16/19   Long, Wonda Olds, MD  Multiple Vitamin (MULTIVITAMIN WITH MINERALS) TABS tablet Take 1 tablet by mouth daily. WOMEN'S ONE A DAY    [provider]  ondansetron (ZOFRAN ODT) 4 MG disintegrating tablet Take 1 tablet (4 mg total) by mouth every 8 (eight) hours as needed. 12/16/19   Long, Wonda Olds, MD  oxybutynin (DITROPAN) 5 MG tablet Take 1 tablet (5 mg total) by mouth 2 (two) times daily. 09/20/19   Soyla Dryer, PA-C    Allergies    Benadryl [diphenhydramine hcl] and Onion  Review of Systems   Review of Systems  Constitutional: Negative for fever.  HENT: Negative for sore throat.   Eyes: Negative for redness.  Respiratory: Negative for cough and shortness of breath.   Cardiovascular: Negative for chest pain.  Gastrointestinal: Positive for abdominal pain. Negative for diarrhea and vomiting.  Endocrine: Negative for polyuria.  Genitourinary: Negative for dysuria, flank pain and hematuria.  Musculoskeletal: Negative for back pain and neck pain.  Skin: Negative for rash.  Neurological: Negative for headaches.  Hematological: Does not bruise/bleed easily.  Psychiatric/Behavioral: Negative for confusion.    Physical Exam Updated Vital Signs BP (!) 149/103 (BP Location: Left Wrist)   Pulse 89   Temp (!) 97.4 F (36.3 C) (Oral)    Resp 18   Ht 1.676 m (5\' 6" )   Wt 122.5 kg   SpO2 94%   BMI 43.58 kg/m   Physical Exam Vitals and nursing note reviewed.  Constitutional:      Appearance: Normal appearance. She is well-developed.  HENT:     Head: Atraumatic.     Nose: Nose normal.     Mouth/Throat:     Mouth: Mucous membranes are moist.  Eyes:     General: No scleral icterus.    Conjunctiva/sclera: Conjunctivae normal.  Neck:     Trachea: No tracheal deviation.  Cardiovascular:     Rate and Rhythm: Normal rate and regular rhythm.  Pulses: Normal pulses.     Heart sounds: Normal heart sounds. No murmur heard.  No friction rub. No gallop.   Pulmonary:     Effort: Pulmonary effort is normal. No respiratory distress.     Breath sounds: Normal breath sounds.  Abdominal:     General: Bowel sounds are normal. There is no distension.     Palpations: Abdomen is soft. There is no mass.     Tenderness: There is abdominal tenderness. There is no guarding or rebound.     Hernia: No hernia is present.     Comments: Mid to lower abdominal tenderness.   Genitourinary:    Comments: No cva tenderness.  Musculoskeletal:        General: No swelling.     Cervical back: Normal range of motion and neck supple. No rigidity. No muscular tenderness.  Skin:    General: Skin is warm and dry.     Findings: No rash.  Neurological:     Mental Status: She is alert.     Comments: Alert, speech normal.   Psychiatric:        Mood and Affect: Mood normal.     ED Results / Procedures / Treatments   Labs (all labs ordered are listed, but only abnormal results are displayed) Results for orders placed or performed during the hospital encounter of 12/17/19  Wet prep, genital   Specimen: Cervix  Result Value Ref Range   Yeast Wet Prep HPF POC NONE SEEN NONE SEEN   Trich, Wet Prep NONE SEEN NONE SEEN   Clue Cells Wet Prep HPF POC PRESENT (A) NONE SEEN   WBC, Wet Prep HPF POC FEW (A) NONE SEEN   Sperm NONE SEEN   CBC    Result Value Ref Range   WBC 8.4 4.0 - 10.5 K/uL   RBC 4.68 3.87 - 5.11 MIL/uL   Hemoglobin 12.3 12.0 - 15.0 g/dL   HCT 39.9 36 - 46 %   MCV 85.3 80.0 - 100.0 fL   MCH 26.3 26.0 - 34.0 pg   MCHC 30.8 30.0 - 36.0 g/dL   RDW 16.4 (H) 11.5 - 15.5 %   Platelets 292 150 - 400 K/uL   nRBC 0.0 0.0 - 0.2 %  Comprehensive metabolic panel  Result Value Ref Range   Sodium 138 135 - 145 mmol/L   Potassium 3.5 3.5 - 5.1 mmol/L   Chloride 103 98 - 111 mmol/L   CO2 27 22 - 32 mmol/L   Glucose, Bld 131 (H) 70 - 99 mg/dL   BUN 11 6 - 20 mg/dL   Creatinine, Ser 1.06 (H) 0.44 - 1.00 mg/dL   Calcium 8.8 (L) 8.9 - 10.3 mg/dL   Total Protein 7.7 6.5 - 8.1 g/dL   Albumin 3.6 3.5 - 5.0 g/dL   AST 120 (H) 15 - 41 U/L   ALT 85 (H) 0 - 44 U/L   Alkaline Phosphatase 128 (H) 38 - 126 U/L   Total Bilirubin 0.4 0.3 - 1.2 mg/dL   GFR, Estimated >60 >60 mL/min   Anion gap 8 5 - 15  Lipase, blood  Result Value Ref Range   Lipase 25 11 - 51 U/L  Urinalysis, Routine w reflex microscopic Urine, Clean Catch  Result Value Ref Range   Color, Urine YELLOW YELLOW   APPearance CLEAR CLEAR   Specific Gravity, Urine 1.021 1.005 - 1.030   pH 6.0 5.0 - 8.0   Glucose, UA NEGATIVE NEGATIVE mg/dL   Hgb  urine dipstick SMALL (A) NEGATIVE   Bilirubin Urine NEGATIVE NEGATIVE   Ketones, ur NEGATIVE NEGATIVE mg/dL   Protein, ur 30 (A) NEGATIVE mg/dL   Nitrite NEGATIVE NEGATIVE   Leukocytes,Ua NEGATIVE NEGATIVE   RBC / HPF 0-5 0 - 5 RBC/hpf   WBC, UA 0-5 0 - 5 WBC/hpf   Bacteria, UA NONE SEEN NONE SEEN   Squamous Epithelial / LPF 0-5 0 - 5   Mucus PRESENT   Rapid urine drug screen (hospital performed)  Result Value Ref Range   Opiates POSITIVE (A) NONE DETECTED   Cocaine POSITIVE (A) NONE DETECTED   Benzodiazepines NONE DETECTED NONE DETECTED   Amphetamines NONE DETECTED NONE DETECTED   Tetrahydrocannabinol NONE DETECTED NONE DETECTED   Barbiturates NONE DETECTED NONE DETECTED   CT ABDOMEN PELVIS WO  CONTRAST  Result Date: 12/17/2019 CLINICAL DATA:  Right lower quadrant abdominal pain. Appendicitis suspected. EXAM: CT ABDOMEN AND PELVIS WITHOUT CONTRAST TECHNIQUE: Multidetector CT imaging of the abdomen and pelvis was performed following the standard protocol without IV contrast. COMPARISON:  December 16, 2019 FINDINGS: Lower chest: Hazy subpleural airspace opacity seen in the right lung base. Hepatobiliary: No focal liver abnormality is seen. Status post cholecystectomy. No biliary dilatation. Pancreas: Unremarkable. No pancreatic ductal dilatation or surrounding inflammatory changes. Spleen: Normal in size without focal abnormality. Adrenals/Urinary Tract: Adrenal glands are unremarkable. Kidneys are normal, without renal calculi, focal lesion, or hydronephrosis. Bladder is unremarkable. Stomach/Bowel: Stomach is within normal limits. Appendix appears normal. No evidence of bowel wall thickening, distention, or inflammatory changes. Scattered colonic diverticulosis, no evidence of diverticulitis. Vascular/Lymphatic: No significant vascular findings are present. No enlarged abdominal or pelvic lymph nodes. Reproductive: The bilateral adnexa are unremarkable, accounting for lack of IV contrast. There is a distension of the endometrial canal measuring approximately 2 cm. Other: No abdominal wall hernia or abnormality. Small amount of free pelvic fluid. Musculoskeletal: No acute or significant osseous findings. IMPRESSION: 1. No evidence of acute abnormalities within the solid abdominal organs. 2. Normal appendix. 3. Scattered colonic diverticulosis without evidence of diverticulitis. 4. Distention of the endometrial canal of the uterus, measuring approximately 2 cm, etiology uncertain. Further evaluation with pelvic ultrasound is recommended. 5. Small amount of free pelvic fluid, nonspecific. 6. Hazy subpleural airspace opacity in the right lung base, which may represent atelectasis or early  infectious/inflammatory changes. Electronically Signed   By: Fidela Salisbury M.D.   On: 12/17/2019 18:26   DG Cervical Spine Complete  Result Date: 11/28/2019 CLINICAL DATA:  Pain which begins in neck radiating to left arm with tingling and numbness in left hand for 1 month EXAM: CERVICAL SPINE - COMPLETE 4+ VIEW COMPARISON:  None. FINDINGS: No fracture, height loss or traumatic listhesis. Dens is intact. Lateral masses of C1 are well apposed to those of C2. Minimal spondylitic and facet degenerative changes with early uncinate spurring maximal C4-C6. May result in some mild bilateral foraminal narrowing at the C5-6 level. No significant central canal impingement is evident radiographically. Soft tissues are unremarkable. Airways patent. No acute abnormality in the upper chest or imaged lung apices. IMPRESSION: 1. Minimal spondylitic and facet degenerative changes maximal C4-C6 with possible mild bilateral foraminal narrowing at C5-6. 2. No acute osseous abnormalities. Electronically Signed   By: Lovena Le M.D.   On: 11/28/2019 21:59   CT Head Wo Contrast  Result Date: 12/16/2019 CLINICAL DATA:  49 year old female with head trauma. EXAM: CT HEAD WITHOUT CONTRAST TECHNIQUE: Contiguous axial images were obtained from the base of the  skull through the vertex without intravenous contrast. COMPARISON:  Head CT dated 08/05/2018. FINDINGS: Brain: The ventricles and sulci appropriate size for patient's age. The gray-white matter discrimination is preserved. There is no acute intracranial hemorrhage. No mass effect midline shift. No extra-axial fluid collection. Vascular: No hyperdense vessel or unexpected calcification. Skull: Normal. Negative for fracture or focal lesion. Sinuses/Orbits: No acute finding. Other: None IMPRESSION: Unremarkable noncontrast CT of the brain. Electronically Signed   By: Anner Crete M.D.   On: 12/16/2019 20:51   CT ABDOMEN PELVIS W CONTRAST  Result Date:  12/16/2019 CLINICAL DATA:  Lower abdominal pain, vomiting, dizziness EXAM: CT ABDOMEN AND PELVIS WITH CONTRAST TECHNIQUE: Multidetector CT imaging of the abdomen and pelvis was performed using the standard protocol following bolus administration of intravenous contrast. CONTRAST:  12mL OMNIPAQUE IOHEXOL 300 MG/ML  SOLN COMPARISON:  07/24/2018 FINDINGS: Lower chest: No acute pleural or parenchymal lung disease. Hepatobiliary: No focal liver abnormality is seen. Status post cholecystectomy. No biliary dilatation. Pancreas: Unremarkable. No pancreatic ductal dilatation or surrounding inflammatory changes. Spleen: Normal in size without focal abnormality. Adrenals/Urinary Tract: Adrenal glands are unremarkable. Kidneys are normal, without renal calculi, focal lesion, or hydronephrosis. Bladder is unremarkable. Stomach/Bowel: No bowel obstruction or ileus. Minimal diverticulosis of the descending colon. Normal appendix right lower quadrant. No bowel wall thickening or inflammatory change. Vascular/Lymphatic: No significant vascular findings are present. No enlarged abdominal or pelvic lymph nodes. Reproductive: There is fluid within the endometrial cavity, please correlate with menstrual history. Normal follicles within the ovaries. Other: No free fluid or free gas. No abdominal wall hernia. Musculoskeletal: No acute or destructive bony lesions. Reconstructed images demonstrate no additional findings. IMPRESSION: 1. No acute intra-abdominal or intrapelvic process. 2. Minimal diverticulosis without diverticulitis. 3. Fluid within the endometrial cavity, please correlate with menstrual history. Further evaluation with pelvic ultrasound may be useful. Electronically Signed   By: Randa Ngo M.D.   On: 12/16/2019 20:55    EKG None  Radiology CT ABDOMEN PELVIS WO CONTRAST  Result Date: 12/17/2019 CLINICAL DATA:  Right lower quadrant abdominal pain. Appendicitis suspected. EXAM: CT ABDOMEN AND PELVIS WITHOUT  CONTRAST TECHNIQUE: Multidetector CT imaging of the abdomen and pelvis was performed following the standard protocol without IV contrast. COMPARISON:  December 16, 2019 FINDINGS: Lower chest: Hazy subpleural airspace opacity seen in the right lung base. Hepatobiliary: No focal liver abnormality is seen. Status post cholecystectomy. No biliary dilatation. Pancreas: Unremarkable. No pancreatic ductal dilatation or surrounding inflammatory changes. Spleen: Normal in size without focal abnormality. Adrenals/Urinary Tract: Adrenal glands are unremarkable. Kidneys are normal, without renal calculi, focal lesion, or hydronephrosis. Bladder is unremarkable. Stomach/Bowel: Stomach is within normal limits. Appendix appears normal. No evidence of bowel wall thickening, distention, or inflammatory changes. Scattered colonic diverticulosis, no evidence of diverticulitis. Vascular/Lymphatic: No significant vascular findings are present. No enlarged abdominal or pelvic lymph nodes. Reproductive: The bilateral adnexa are unremarkable, accounting for lack of IV contrast. There is a distension of the endometrial canal measuring approximately 2 cm. Other: No abdominal wall hernia or abnormality. Small amount of free pelvic fluid. Musculoskeletal: No acute or significant osseous findings. IMPRESSION: 1. No evidence of acute abnormalities within the solid abdominal organs. 2. Normal appendix. 3. Scattered colonic diverticulosis without evidence of diverticulitis. 4. Distention of the endometrial canal of the uterus, measuring approximately 2 cm, etiology uncertain. Further evaluation with pelvic ultrasound is recommended. 5. Small amount of free pelvic fluid, nonspecific. 6. Hazy subpleural airspace opacity in the right lung base, which may  represent atelectasis or early infectious/inflammatory changes. Electronically Signed   By: Fidela Salisbury M.D.   On: 12/17/2019 18:26   CT Head Wo Contrast  Result Date:  12/16/2019 CLINICAL DATA:  49 year old female with head trauma. EXAM: CT HEAD WITHOUT CONTRAST TECHNIQUE: Contiguous axial images were obtained from the base of the skull through the vertex without intravenous contrast. COMPARISON:  Head CT dated 08/05/2018. FINDINGS: Brain: The ventricles and sulci appropriate size for patient's age. The gray-white matter discrimination is preserved. There is no acute intracranial hemorrhage. No mass effect midline shift. No extra-axial fluid collection. Vascular: No hyperdense vessel or unexpected calcification. Skull: Normal. Negative for fracture or focal lesion. Sinuses/Orbits: No acute finding. Other: None IMPRESSION: Unremarkable noncontrast CT of the brain. Electronically Signed   By: Anner Crete M.D.   On: 12/16/2019 20:51   CT ABDOMEN PELVIS W CONTRAST  Result Date: 12/16/2019 CLINICAL DATA:  Lower abdominal pain, vomiting, dizziness EXAM: CT ABDOMEN AND PELVIS WITH CONTRAST TECHNIQUE: Multidetector CT imaging of the abdomen and pelvis was performed using the standard protocol following bolus administration of intravenous contrast. CONTRAST:  161mL OMNIPAQUE IOHEXOL 300 MG/ML  SOLN COMPARISON:  07/24/2018 FINDINGS: Lower chest: No acute pleural or parenchymal lung disease. Hepatobiliary: No focal liver abnormality is seen. Status post cholecystectomy. No biliary dilatation. Pancreas: Unremarkable. No pancreatic ductal dilatation or surrounding inflammatory changes. Spleen: Normal in size without focal abnormality. Adrenals/Urinary Tract: Adrenal glands are unremarkable. Kidneys are normal, without renal calculi, focal lesion, or hydronephrosis. Bladder is unremarkable. Stomach/Bowel: No bowel obstruction or ileus. Minimal diverticulosis of the descending colon. Normal appendix right lower quadrant. No bowel wall thickening or inflammatory change. Vascular/Lymphatic: No significant vascular findings are present. No enlarged abdominal or pelvic lymph nodes.  Reproductive: There is fluid within the endometrial cavity, please correlate with menstrual history. Normal follicles within the ovaries. Other: No free fluid or free gas. No abdominal wall hernia. Musculoskeletal: No acute or destructive bony lesions. Reconstructed images demonstrate no additional findings. IMPRESSION: 1. No acute intra-abdominal or intrapelvic process. 2. Minimal diverticulosis without diverticulitis. 3. Fluid within the endometrial cavity, please correlate with menstrual history. Further evaluation with pelvic ultrasound may be useful. Electronically Signed   By: Randa Ngo M.D.   On: 12/16/2019 20:55    Procedures Procedures (including critical care time)  Medications Ordered in ED Medications  sodium chloride 0.9 % bolus 1,000 mL (has no administration in time range)  HYDROmorphone (DILAUDID) injection 1 mg (has no administration in time range)  ondansetron (ZOFRAN) injection 4 mg (has no administration in time range)    ED Course  I have reviewed the triage vital signs and the nursing notes.  Pertinent labs & imaging results that were available during my care of the patient were reviewed by me and considered in my medical decision making (see chart for details).    MDM Rules/Calculators/A&P                         Iv ns bolus. Stat labs. Dilaudid iv. zofran iv.  Reviewed nursing notes and prior charts for additional history. Pt with labs and ct yesterday - no definite acute process then.   Labs reviewed/interpreted by me - wbc normal. lfts mildly increased.  Patients pain is lower abd/pelvis, no ruq pain or tenderness.   On pelvic exam, marked cmt, mild yellowish discharge. No fb. No focal adnexal tenderness. Pt indicates is sexually active. No known std exposure.  Abx ordered. Rocephin iv.  Doxy iv.   CT reviewed/interpreted by me - endometr abn, no other acute process. Will have return in AM for u/s and recheck as not currently available now.       Final  Clinical Impression(s) / ED Diagnoses Final diagnoses:  None    Rx / DC Orders ED Discharge Orders    None       Lajean Saver, MD 12/18/19 1535

## 2019-12-17 NOTE — ED Triage Notes (Signed)
Pt with worsening abd pain, reported that pt was seen last night for same.

## 2019-12-18 LAB — URINE CULTURE: Culture: <10000 — AB

## 2019-12-19 LAB — GC/CHLAMYDIA PROBE AMP (~~LOC~~) NOT AT ARMC
Chlamydia: NEGATIVE
Comment: NEGATIVE
Comment: NORMAL
Neisseria Gonorrhea: NEGATIVE

## 2019-12-21 ENCOUNTER — Encounter: Payer: Self-pay | Admitting: Physician Assistant

## 2019-12-21 ENCOUNTER — Ambulatory Visit: Payer: Self-pay | Admitting: Physician Assistant

## 2019-12-21 VITALS — BP 110/80 | HR 101 | Temp 97.2°F | Ht 66.0 in | Wt 271.8 lb

## 2019-12-21 DIAGNOSIS — Z5320 Procedure and treatment not carried out because of patient's decision for unspecified reasons: Secondary | ICD-10-CM

## 2019-12-21 DIAGNOSIS — M542 Cervicalgia: Secondary | ICD-10-CM

## 2019-12-21 DIAGNOSIS — R202 Paresthesia of skin: Secondary | ICD-10-CM

## 2019-12-21 MED ORDER — PREDNISONE 20 MG PO TABS: 20.0000 mg | ORAL_TABLET | Freq: Two times a day (BID) | ORAL | 0 refills | Status: DC

## 2019-12-21 NOTE — Progress Notes (Signed)
BP 110/80   Pulse (!) 101   Temp (!) 97.2 F (36.2 C)   Ht 5\' 6"  (1.676 m)   Wt 271 lb 12 oz (123.3 kg)   SpO2 97%   BMI 43.86 kg/m    Subjective:    Patient ID: Kristi Martin, female    DOB: 1970-12-21, 49 y.o.   MRN: [id number]  HPI: Kristi Martin is a 49 y.o. female presenting on 12/21/2019 for Neck Pain and Numbness (tingling down left arm to L hand)   HPI    Pt had a negative covid 19 screening questionnaire.    Pt is 49yoF with appt to follow up on neck pain and tingling. Her symptoms persist.   She submitted her [employer] financial assistance.  She has more papers she needs to submit in order for her application to be complete.   Pt was seen in ER this past weekend.  She says Her abdominal pain is doing better     Relevant past medical, surgical, family and social history reviewed and updated as indicated. Interim medical history since our last visit reviewed. Allergies and medications reviewed and updated.   Current Outpatient Medications:  .  albuterol (VENTOLIN HFA) 108 (90 Base) MCG/ACT inhaler, Inhale 1-2 puffs into the lungs every 6 (six) hours as needed for wheezing or shortness of breath., Disp: 6.7 g, Rfl: 0 .  amitriptyline (ELAVIL) 50 MG tablet, Take 50 mg by mouth at bedtime., Disp: , Rfl:  .  dicyclomine (BENTYL) 20 MG tablet, Take 1 tablet (20 mg total) by mouth 3 (three) times daily as needed for spasms (abdominal cramping)., Disp: 20 tablet, Rfl: 0 .  doxycycline (VIBRAMYCIN) 100 MG capsule, Take 1 capsule (100 mg total) by mouth 2 (two) times daily., Disp: 28 capsule, Rfl: 0 .  Ferrous Sulfate (IRON PO), Take 1 tablet by mouth daily., Disp: , Rfl:  .  metroNIDAZOLE (FLAGYL) 500 MG tablet, Take 1 tablet (500 mg total) by mouth 2 (two) times daily., Disp: 28 tablet, Rfl: 0 .  Multiple Vitamin (MULTIVITAMIN WITH MINERALS) TABS tablet, Take 1 tablet by mouth daily. WOMEN'S ONE A DAY, Disp: , Rfl:  .  ondansetron (ZOFRAN ODT) 4 MG  disintegrating tablet, Take 1 tablet (4 mg total) by mouth every 8 (eight) hours as needed., Disp: 20 tablet, Rfl: 0 .  oxybutynin (DITROPAN) 5 MG tablet, Take 1 tablet (5 mg total) by mouth 2 (two) times daily., Disp: 60 tablet, Rfl: 0 .  Probiotic Product (PROBIOTIC PO), Take 1 tablet by mouth daily., Disp: , Rfl:  .  cholecalciferol (VITAMIN D) 1000 units tablet, Take 1,000 Units by mouth daily. (Patient not taking: Reported on 12/21/2019), Disp: , Rfl:  .  cyclobenzaprine (FLEXERIL) 10 MG tablet, Take 1 tablet (10 mg total) by mouth 3 (three) times daily as needed for muscle spasms. (Patient not taking: Reported on 11/16/2019), Disp: 30 tablet, Rfl: 0    Review of Systems  Per HPI unless specifically indicated above     Objective:    BP 110/80   Pulse (!) 101   Temp (!) 97.2 F (36.2 C)   Ht 5\' 6"  (1.676 m)   Wt 271 lb 12 oz (123.3 kg)   SpO2 97%   BMI 43.86 kg/m   Wt Readings from Last 3 Encounters:  12/21/19 271 lb 12 oz (123.3 kg)  12/17/19 270 lb (122.5 kg)  12/16/19 270 lb (122.5 kg)    Physical Exam Vitals reviewed.  Constitutional:  General: She is not in acute distress.    Appearance: She is well-developed. She is obese. She is not ill-appearing.  HENT:     Head: Normocephalic and atraumatic.  Cardiovascular:     Rate and Rhythm: Normal rate and regular rhythm.  Pulmonary:     Effort: Pulmonary effort is normal.     Breath sounds: Normal breath sounds.  Abdominal:     General: Bowel sounds are normal.     Palpations: Abdomen is soft. There is no mass.     Tenderness: There is no abdominal tenderness.  Musculoskeletal:     Cervical back: Neck supple.  Lymphadenopathy:     Cervical: No cervical adenopathy.  Skin:    General: Skin is warm and dry.  Neurological:     Mental Status: She is alert and oriented to person, place, and time.  Psychiatric:        Behavior: Behavior normal.            Assessment & Plan:    Encounter Diagnoses   Name Primary?  . Neck pain Yes  . Paresthesia of left arm   . Morbid obesity ([place])   . Screening mammography declined       -reviewed results neck xray with pt -Course prednisone.  Did encourage pt to avoid using cocaine while on this medication -Refer orthopedics (has seen Dr Luna Glasgow in the past) -Pt urged to complete paperwork for cafa. -pt still wants to wait until next appointment before getting referred for screening mammogram -pt to follow up 3 months.  She is to contact office sooner prn

## 2020-01-25 ENCOUNTER — Ambulatory Visit: Payer: Self-pay | Admitting: Urology

## 2020-02-07 ENCOUNTER — Other Ambulatory Visit: Payer: Self-pay

## 2020-02-07 ENCOUNTER — Emergency Department (HOSPITAL_COMMUNITY): Payer: HRSA Program

## 2020-02-07 ENCOUNTER — Encounter (HOSPITAL_COMMUNITY): Payer: Self-pay | Admitting: Emergency Medicine

## 2020-02-07 DIAGNOSIS — N179 Acute kidney failure, unspecified: Secondary | ICD-10-CM | POA: Insufficient documentation

## 2020-02-07 DIAGNOSIS — N183 Chronic kidney disease, stage 3 unspecified: Secondary | ICD-10-CM | POA: Diagnosis not present

## 2020-02-07 DIAGNOSIS — D631 Anemia in chronic kidney disease: Secondary | ICD-10-CM | POA: Insufficient documentation

## 2020-02-07 DIAGNOSIS — Z79899 Other long term (current) drug therapy: Secondary | ICD-10-CM | POA: Insufficient documentation

## 2020-02-07 DIAGNOSIS — J1282 Pneumonia due to coronavirus disease 2019: Secondary | ICD-10-CM | POA: Insufficient documentation

## 2020-02-07 DIAGNOSIS — U071 COVID-19: Principal | ICD-10-CM | POA: Insufficient documentation

## 2020-02-07 DIAGNOSIS — J9601 Acute respiratory failure with hypoxia: Secondary | ICD-10-CM | POA: Insufficient documentation

## 2020-02-07 DIAGNOSIS — J45909 Unspecified asthma, uncomplicated: Secondary | ICD-10-CM | POA: Diagnosis not present

## 2020-02-07 DIAGNOSIS — R0789 Other chest pain: Secondary | ICD-10-CM | POA: Diagnosis present

## 2020-02-07 DIAGNOSIS — E876 Hypokalemia: Secondary | ICD-10-CM | POA: Insufficient documentation

## 2020-02-07 DIAGNOSIS — R079 Chest pain, unspecified: Secondary | ICD-10-CM

## 2020-02-07 NOTE — ED Triage Notes (Signed)
Pt with c/o L sided chest pain that started today. Pt states she also passed out 3 times today with "LOC". Pt with cough x 3 days, vomiting x 2 days, chills, and headache. Unsure of any Covid exposure but states she has been vaccinated.

## 2020-02-08 ENCOUNTER — Observation Stay (HOSPITAL_COMMUNITY)
Admission: EM | Admit: 2020-02-08 | Discharge: 2020-02-09 | Disposition: A | Discharge status: Home / Self Care | Payer: HRSA Program | Attending: Internal Medicine | Admitting: Internal Medicine

## 2020-02-08 DIAGNOSIS — R0902 Hypoxemia: Secondary | ICD-10-CM

## 2020-02-08 DIAGNOSIS — E669 Obesity, unspecified: Secondary | ICD-10-CM | POA: Diagnosis present

## 2020-02-08 DIAGNOSIS — R55 Syncope and collapse: Secondary | ICD-10-CM

## 2020-02-08 DIAGNOSIS — U071 COVID-19: Secondary | ICD-10-CM | POA: Diagnosis not present

## 2020-02-08 DIAGNOSIS — R0602 Shortness of breath: Secondary | ICD-10-CM

## 2020-02-08 DIAGNOSIS — R079 Chest pain, unspecified: Secondary | ICD-10-CM

## 2020-02-08 DIAGNOSIS — J1282 Pneumonia due to coronavirus disease 2019: Secondary | ICD-10-CM | POA: Diagnosis not present

## 2020-02-08 DIAGNOSIS — G8929 Other chronic pain: Secondary | ICD-10-CM | POA: Diagnosis present

## 2020-02-08 DIAGNOSIS — F411 Generalized anxiety disorder: Secondary | ICD-10-CM | POA: Diagnosis present

## 2020-02-08 DIAGNOSIS — K219 Gastro-esophageal reflux disease without esophagitis: Secondary | ICD-10-CM | POA: Diagnosis present

## 2020-02-08 DIAGNOSIS — G629 Polyneuropathy, unspecified: Secondary | ICD-10-CM

## 2020-02-08 DIAGNOSIS — F1910 Other psychoactive substance abuse, uncomplicated: Secondary | ICD-10-CM | POA: Diagnosis present

## 2020-02-08 DIAGNOSIS — D649 Anemia, unspecified: Secondary | ICD-10-CM | POA: Diagnosis present

## 2020-02-08 DIAGNOSIS — M5136 Other intervertebral disc degeneration, lumbar region: Secondary | ICD-10-CM | POA: Diagnosis present

## 2020-02-08 DIAGNOSIS — N183 Chronic kidney disease, stage 3 unspecified: Secondary | ICD-10-CM | POA: Diagnosis present

## 2020-02-08 DIAGNOSIS — J189 Pneumonia, unspecified organism: Secondary | ICD-10-CM | POA: Diagnosis present

## 2020-02-08 DIAGNOSIS — M549 Dorsalgia, unspecified: Secondary | ICD-10-CM | POA: Diagnosis present

## 2020-02-08 LAB — CBC
HCT: 41.9 % (ref 36.0–46.0)
Hemoglobin: 12.9 g/dL (ref 12.0–15.0)
MCH: 26.8 pg (ref 26.0–34.0)
MCHC: 30.8 g/dL (ref 30.0–36.0)
MCV: 86.9 fL (ref 80.0–100.0)
Platelets: 244 10*3/uL (ref 150–400)
RBC: 4.82 MIL/uL (ref 3.87–5.11)
RDW: 16.1 % — ABNORMAL HIGH (ref 11.5–15.5)
WBC: 3.6 10*3/uL — ABNORMAL LOW (ref 4.0–10.5)
nRBC: 0 % (ref 0.0–0.2)

## 2020-02-08 LAB — BASIC METABOLIC PANEL
Anion gap: 10 (ref 5–15)
BUN: 12 mg/dL (ref 6–20)
CO2: 27 mmol/L (ref 22–32)
Calcium: 8.6 mg/dL — ABNORMAL LOW (ref 8.9–10.3)
Chloride: 102 mmol/L (ref 98–111)
Creatinine, Ser: 1.4 mg/dL — ABNORMAL HIGH (ref 0.44–1.00)
GFR, Estimated: 46 mL/min — ABNORMAL LOW (ref >60)
Glucose, Bld: 115 mg/dL — ABNORMAL HIGH (ref 70–99)
Potassium: 3.4 mmol/L — ABNORMAL LOW (ref 3.5–5.1)
Sodium: 139 mmol/L (ref 135–145)

## 2020-02-08 LAB — TRIGLYCERIDES: Triglycerides: 98 mg/dL (ref <150)

## 2020-02-08 LAB — LACTATE DEHYDROGENASE: LDH: 236 U/L — ABNORMAL HIGH (ref 98–192)

## 2020-02-08 LAB — LACTIC ACID, PLASMA: Lactic Acid, Venous: 1 mmol/L (ref 0.5–1.9)

## 2020-02-08 LAB — PROCALCITONIN: Procalcitonin: <0.1 ng/mL

## 2020-02-08 LAB — C-REACTIVE PROTEIN: CRP: 2.6 mg/dL — ABNORMAL HIGH (ref <1.0)

## 2020-02-08 LAB — FERRITIN: Ferritin: 32 ng/mL (ref 11–307)

## 2020-02-08 LAB — D-DIMER, QUANTITATIVE: D-Dimer, Quant: 0.41 ug/mL-FEU (ref 0.00–0.50)

## 2020-02-08 LAB — FIBRINOGEN: Fibrinogen: 451 mg/dL (ref 210–475)

## 2020-02-08 LAB — TROPONIN I (HIGH SENSITIVITY)
Troponin I (High Sensitivity): 4 ng/L (ref <18)
Troponin I (High Sensitivity): 4 ng/L (ref <18)

## 2020-02-08 LAB — POC SARS CORONAVIRUS 2 AG -  ED: SARS Coronavirus 2 Ag: POSITIVE — AB

## 2020-02-08 LAB — HIV ANTIBODY (ROUTINE TESTING W REFLEX): HIV Screen 4th Generation wRfx: NONREACTIVE

## 2020-02-08 MED ORDER — SODIUM CHLORIDE 0.9 % IV SOLN
1.0000 g | Freq: Once | INTRAVENOUS | Status: AC
  Administered 2020-02-08: 1 g via INTRAVENOUS
  Filled 2020-02-08: qty 10

## 2020-02-08 MED ORDER — IPRATROPIUM-ALBUTEROL 20-100 MCG/ACT IN AERS
1.0000 | INHALATION_SPRAY | Freq: Four times a day (QID) | RESPIRATORY_TRACT | Status: DC
  Administered 2020-02-08 (×3): 1 via RESPIRATORY_TRACT
  Filled 2020-02-08: qty 4

## 2020-02-08 MED ORDER — PANTOPRAZOLE SODIUM 40 MG PO TBEC
40.0000 mg | DELAYED_RELEASE_TABLET | Freq: Every day | ORAL | Status: DC
  Administered 2020-02-08 – 2020-02-09 (×2): 40 mg via ORAL
  Filled 2020-02-08 (×2): qty 1

## 2020-02-08 MED ORDER — OXYBUTYNIN CHLORIDE 5 MG PO TABS
5.0000 mg | ORAL_TABLET | Freq: Two times a day (BID) | ORAL | Status: DC
  Administered 2020-02-08 – 2020-02-09 (×3): 5 mg via ORAL
  Filled 2020-02-08 (×3): qty 1

## 2020-02-08 MED ORDER — ASCORBIC ACID 500 MG PO TABS
500.0000 mg | ORAL_TABLET | Freq: Every day | ORAL | Status: DC
  Administered 2020-02-08 – 2020-02-09 (×2): 500 mg via ORAL
  Filled 2020-02-08 (×2): qty 1

## 2020-02-08 MED ORDER — SODIUM CHLORIDE 0.9 % IV SOLN: INTRAVENOUS | Status: DC

## 2020-02-08 MED ORDER — ENOXAPARIN SODIUM 60 MG/0.6ML ~~LOC~~ SOLN
60.0000 mg | SUBCUTANEOUS | Status: DC
  Administered 2020-02-08 – 2020-02-09 (×2): 60 mg via SUBCUTANEOUS
  Filled 2020-02-08 (×2): qty 0.6

## 2020-02-08 MED ORDER — HYDROCOD POLST-CPM POLST ER 10-8 MG/5ML PO SUER: 5.0000 mL | Freq: Two times a day (BID) | ORAL | Status: DC | PRN

## 2020-02-08 MED ORDER — POTASSIUM CHLORIDE CRYS ER 20 MEQ PO TBCR
40.0000 meq | EXTENDED_RELEASE_TABLET | Freq: Once | ORAL | Status: AC
  Administered 2020-02-08: 40 meq via ORAL
  Filled 2020-02-08: qty 2

## 2020-02-08 MED ORDER — SODIUM CHLORIDE 0.9 % IV SOLN
100.0000 mg | INTRAVENOUS | Status: AC
  Administered 2020-02-08 (×2): 100 mg via INTRAVENOUS
  Filled 2020-02-08 (×2): qty 20

## 2020-02-08 MED ORDER — ENOXAPARIN SODIUM 40 MG/0.4ML ~~LOC~~ SOLN
40.0000 mg | SUBCUTANEOUS | Status: DC
  Filled 2020-02-08: qty 0.4

## 2020-02-08 MED ORDER — ZINC SULFATE 220 (50 ZN) MG PO CAPS
220.0000 mg | ORAL_CAPSULE | Freq: Every day | ORAL | Status: DC
  Administered 2020-02-08 – 2020-02-09 (×2): 220 mg via ORAL
  Filled 2020-02-08 (×2): qty 1

## 2020-02-08 MED ORDER — ONDANSETRON HCL 4 MG/2ML IJ SOLN
4.0000 mg | Freq: Once | INTRAMUSCULAR | Status: AC
  Administered 2020-02-08: 4 mg via INTRAVENOUS
  Filled 2020-02-08: qty 2

## 2020-02-08 MED ORDER — SODIUM CHLORIDE 0.9 % IV SOLN
500.0000 mg | Freq: Once | INTRAVENOUS | Status: AC
  Administered 2020-02-08: 500 mg via INTRAVENOUS
  Filled 2020-02-08: qty 500

## 2020-02-08 MED ORDER — OXYCODONE HCL 5 MG PO TABS
5.0000 mg | ORAL_TABLET | ORAL | Status: DC | PRN
  Administered 2020-02-08: 5 mg via ORAL
  Filled 2020-02-08: qty 1

## 2020-02-08 MED ORDER — PREDNISONE 20 MG PO TABS: 50.0000 mg | ORAL_TABLET | Freq: Every day | ORAL | Status: DC

## 2020-02-08 MED ORDER — ONDANSETRON HCL 4 MG/2ML IJ SOLN: 4.0000 mg | Freq: Four times a day (QID) | INTRAMUSCULAR | Status: DC | PRN

## 2020-02-08 MED ORDER — GUAIFENESIN-DM 100-10 MG/5ML PO SYRP
10.0000 mL | ORAL_SOLUTION | ORAL | Status: DC | PRN
  Administered 2020-02-08: 10 mL via ORAL
  Filled 2020-02-08: qty 10

## 2020-02-08 MED ORDER — BISACODYL 5 MG PO TBEC: 5.0000 mg | DELAYED_RELEASE_TABLET | Freq: Every day | ORAL | Status: DC | PRN

## 2020-02-08 MED ORDER — ACETAMINOPHEN 325 MG PO TABS: 650.0000 mg | ORAL_TABLET | Freq: Four times a day (QID) | ORAL | Status: DC | PRN

## 2020-02-08 MED ORDER — SODIUM CHLORIDE 0.9 % IV SOLN
100.0000 mg | Freq: Every day | INTRAVENOUS | Status: DC
  Administered 2020-02-09: 100 mg via INTRAVENOUS
  Filled 2020-02-08: qty 20

## 2020-02-08 MED ORDER — SODIUM CHLORIDE 0.9 % IV BOLUS
1000.0000 mL | Freq: Once | INTRAVENOUS | Status: AC
  Administered 2020-02-08: 1000 mL via INTRAVENOUS

## 2020-02-08 MED ORDER — DICYCLOMINE HCL 10 MG PO CAPS: 20.0000 mg | ORAL_CAPSULE | Freq: Three times a day (TID) | ORAL | Status: DC | PRN

## 2020-02-08 MED ORDER — FERROUS SULFATE 325 (65 FE) MG PO TABS
325.0000 mg | ORAL_TABLET | Freq: Every day | ORAL | Status: DC
  Administered 2020-02-08 – 2020-02-09 (×2): 325 mg via ORAL
  Filled 2020-02-08 (×2): qty 1

## 2020-02-08 MED ORDER — SODIUM CHLORIDE 0.9 % IV BOLUS
500.0000 mL | Freq: Once | INTRAVENOUS | Status: AC
  Administered 2020-02-08: 500 mL via INTRAVENOUS

## 2020-02-08 MED ORDER — AMITRIPTYLINE HCL 25 MG PO TABS
50.0000 mg | ORAL_TABLET | Freq: Every day | ORAL | Status: DC
  Administered 2020-02-08: 50 mg via ORAL
  Filled 2020-02-08 (×2): qty 1
  Filled 2020-02-08: qty 2

## 2020-02-08 MED ORDER — METHYLPREDNISOLONE SODIUM SUCC 125 MG IJ SOLR
0.5000 mg/kg | Freq: Two times a day (BID) | INTRAMUSCULAR | Status: DC
  Administered 2020-02-08 – 2020-02-09 (×3): 63.75 mg via INTRAVENOUS
  Filled 2020-02-08 (×3): qty 2

## 2020-02-08 MED ORDER — ONDANSETRON HCL 4 MG PO TABS: 4.0000 mg | ORAL_TABLET | Freq: Four times a day (QID) | ORAL | Status: DC | PRN

## 2020-02-08 NOTE — ED Provider Notes (Signed)
[facility] EMERGENCY DEPARTMENT Provider Note   CSN: [id number] Arrival date & time: 02/07/20  2303   Time seen 5:55 AM  History Chief Complaint  Patient presents with  . Chest Pain    Kristi Martin is a 50 y.o. female.  HPI   Patient states 3 days ago she started having a dry cough, fever up to 102 on 1 4.  She denies sore throat but has had rhinorrhea.  She has had nausea and vomited twice.  She denies diarrhea.  She states she has central chest pain when she coughs.  She denies having left-sided chest pain.  She states she has been short of breath since she started feeling bad 3 days ago.  She states today, January 4 she was at the store talking to the checkout person and the next thing she knew she had passed out.  She had no warning or dizziness or lightheadedness before hand.  She states she went to the store to get some bottled water because everybody else was at work.  Patient has had the Materna vaccine x2.  PCP Soyla Dryer, PA-C   Past Medical History:  Diagnosis Date  . Anemia   . Anxiety   . Asthma    as child  . Cancer ([place])    cervical cx at age 61  . Cervical cancer ([place])   . Chronic back pain   . Chronic kidney disease   . CKD (chronic kidney disease), symptom management only, stage 3 (moderate) ([place])   . DDD (degenerative disc disease), lumbar    Facet arthritis  . GERD (gastroesophageal reflux disease)   . Hypotension   . Left knee pain   . Migraine   . Peripheral neuropathy   . Sciatica   . Substance abuse ([place])   . Vaginal Pap smear, abnormal     Patient Active Problem List   Diagnosis Date Noted  . Pneumonia 02/08/2020  . Substance abuse ([place]) 05/27/2016  . MDD (major depressive disorder) 06/08/2015  . GAD (generalized anxiety disorder) 02/07/2015  . DDD (degenerative disc disease), lumbar 10/10/2014  . Facet arthropathy, lumbar 10/10/2014  . HGSIL (high grade squamous intraepithelial lesion) on Pap smear of cervix 08/04/2014  .  Chronic back pain 07/27/2014  . GERD (gastroesophageal reflux disease) 07/27/2014  . Peripheral neuropathy 06/28/2014  . Obesity 06/28/2014  . Glucose intolerance (impaired glucose tolerance) 04/03/2014  . Chronic anemia 04/01/2014  . CKD (chronic kidney disease) stage 3, GFR 30-59 ml/min ([place]) 04/01/2014    Past Surgical History:  Procedure Laterality Date  . CERVICAL CONIZATION W/BX N/A 04/01/2017   Procedure: LASER CONIZATION CERVIX WITH BIOPSY;  Surgeon: Florian Buff, MD;  Location: [facility];  Service: Gynecology;  Laterality: N/A;  . CHOLECYSTECTOMY N/A 01/20/2013   Procedure: LAPAROSCOPIC CHOLECYSTECTOMY WITH INTRAOPERATIVE CHOLANGIOGRAM;  Surgeon: Shann Medal, MD;  Location: [facility];  Service: General;  Laterality: N/A;  . DILATATION AND CURETTAGE/HYSTEROSCOPY WITH MINERVA N/A 11/10/2018   Procedure: DILATATION AND CURETTAGE/HYSTEROSCOPY WITH MINERVA;  Surgeon: Florian Buff, MD;  Location: [facility];  Service: Gynecology;  Laterality: N/A;  . TUBAL LIGATION       OB History    Gravida  9   Para  7   Term  5   Preterm  2   AB  2   Living  7     SAB  2   IAB      Ectopic      Multiple  Live Births  1           Family History  Problem Relation Age of Onset  . Cancer Mother   . Colon cancer Mother   . Breast cancer Maternal Grandmother   . Heart disease Maternal Grandmother   . Cancer Maternal Grandmother   . Breast cancer Paternal Grandmother   . Heart disease Paternal Grandmother   . Hypertension Paternal Grandmother   . Alcohol abuse Father   . Drug abuse Father   . Heart disease Father   . Asthma Brother   . Cancer Paternal Grandfather     Social History   Tobacco Use  . Smoking status: Never Smoker  . Smokeless tobacco: Never Used  Vaping Use  . Vaping Use: Never used  Substance Use Topics  . Alcohol use: Yes    Alcohol/week: 0.0 standard drinks    Comment: occasional  . Drug use: Not Currently    Types: "Crack" cocaine     Home Medications Prior to Admission medications   Medication Sig Start Date End Date Taking? Authorizing Provider  albuterol (VENTOLIN HFA) 108 (90 Base) MCG/ACT inhaler Inhale 1-2 puffs into the lungs every 6 (six) hours as needed for wheezing or shortness of breath. 04/13/19   Petrucelli, Samantha R, PA-C  amitriptyline (ELAVIL) 50 MG tablet Take 50 mg by mouth at bedtime.    [provider]  cholecalciferol (VITAMIN D) 1000 units tablet Take 1,000 Units by mouth daily. Patient not taking: Reported on 12/21/2019    [provider]  cyclobenzaprine (FLEXERIL) 10 MG tablet Take 1 tablet (10 mg total) by mouth 3 (three) times daily as needed for muscle spasms. Patient not taking: Reported on 11/16/2019 10/13/19   Soyla Dryer, PA-C  dicyclomine (BENTYL) 20 MG tablet Take 1 tablet (20 mg total) by mouth 3 (three) times daily as needed for spasms (abdominal cramping). 12/16/19   Long, Wonda Olds, MD  doxycycline (VIBRAMYCIN) 100 MG capsule Take 1 capsule (100 mg total) by mouth 2 (two) times daily. 12/17/19   Lajean Saver, MD  Ferrous Sulfate (IRON PO) Take 1 tablet by mouth daily.    [provider]  metroNIDAZOLE (FLAGYL) 500 MG tablet Take 1 tablet (500 mg total) by mouth 2 (two) times daily. 12/17/19   Lajean Saver, MD  Multiple Vitamin (MULTIVITAMIN WITH MINERALS) TABS tablet Take 1 tablet by mouth daily. WOMEN'S ONE A DAY    [provider]  ondansetron (ZOFRAN ODT) 4 MG disintegrating tablet Take 1 tablet (4 mg total) by mouth every 8 (eight) hours as needed. 12/16/19   Long, Wonda Olds, MD  oxybutynin (DITROPAN) 5 MG tablet Take 1 tablet (5 mg total) by mouth 2 (two) times daily. 09/20/19   Soyla Dryer, PA-C  predniSONE (DELTASONE) 20 MG tablet Take 1 tablet (20 mg total) by mouth 2 (two) times daily with a meal. 12/21/19   Soyla Dryer, PA-C  Probiotic Product (PROBIOTIC PO) Take 1 tablet by mouth daily.    [provider]     Allergies    Benadryl [diphenhydramine hcl] and Onion  Review of Systems   Review of Systems  All other systems reviewed and are negative.   Physical Exam Updated Vital Signs BP 99/70 (BP Location: Right Arm)   Pulse 92   Temp 98.7 F (37.1 C) (Oral)   Resp 17   Ht 5\' 6"  (1.676 m)   Wt 127.9 kg   SpO2 97%   BMI 45.52 kg/m   Physical Exam  Vitals and nursing note reviewed.  Constitutional:      General: She is not in acute distress.    Appearance: She is obese.     Comments: Patient looks weak and like she feels bad  HENT:     Head: Normocephalic and atraumatic.     Right Ear: External ear normal.     Left Ear: External ear normal.  Eyes:     Extraocular Movements: Extraocular movements intact.     Conjunctiva/sclera: Conjunctivae normal.     Pupils: Pupils are equal, round, and reactive to light.  Cardiovascular:     Rate and Rhythm: Normal rate and regular rhythm.     Pulses: Normal pulses.     Heart sounds: Normal heart sounds.  Pulmonary:     Effort: Pulmonary effort is normal. No respiratory distress.     Breath sounds: Normal breath sounds. No stridor. No wheezing, rhonchi or rales.  Musculoskeletal:        General: Normal range of motion.     Cervical back: Normal range of motion.  Skin:    General: Skin is warm and dry.  Neurological:     Mental Status: She is alert.     Cranial Nerves: Cranial nerves are intact.     Comments: Patient appears diffusely weak  Psychiatric:        Mood and Affect: Affect is flat.        Speech: Speech is delayed.        Behavior: Behavior is slowed.       ED Results / Procedures / Treatments   Labs (all labs ordered are listed, but only abnormal results are displayed) Results for orders placed or performed during the hospital encounter of [id number]  Basic metabolic panel  Result Value Ref Range   Sodium 139 135 - 145 mmol/L   Potassium 3.4 (L) 3.5 - 5.1 mmol/L   Chloride 102 98 - 111 mmol/L   CO2 27 22 - 32  mmol/L   Glucose, Bld 115 (H) 70 - 99 mg/dL   BUN 12 6 - 20 mg/dL   Creatinine, Ser 1.40 (H) 0.44 - 1.00 mg/dL   Calcium 8.6 (L) 8.9 - 10.3 mg/dL   GFR, Estimated 46 (L) >60 mL/min   Anion gap 10 5 - 15  CBC  Result Value Ref Range   WBC 3.6 (L) 4.0 - 10.5 K/uL   RBC 4.82 3.87 - 5.11 MIL/uL   Hemoglobin 12.9 12.0 - 15.0 g/dL   HCT 41.9 36.0 - 46.0 %   MCV 86.9 80.0 - 100.0 fL   MCH 26.8 26.0 - 34.0 pg   MCHC 30.8 30.0 - 36.0 g/dL   RDW 16.1 (H) 11.5 - 15.5 %   Platelets 244 150 - 400 K/uL   nRBC 0.0 0.0 - 0.2 %  Lactic acid, plasma  Result Value Ref Range   Lactic Acid, Venous 1.0 0.5 - 1.9 mmol/L  D-dimer, quantitative  Result Value Ref Range   D-Dimer, Quant 0.41 0.00 - 0.50 ug/mL-FEU  Lactate dehydrogenase  Result Value Ref Range   LDH 236 (H) 98 - 192 U/L  Fibrinogen  Result Value Ref Range   Fibrinogen 451 210 - 475 mg/dL  Troponin I (High Sensitivity)  Result Value Ref Range   Troponin I (High Sensitivity) 4 <18 ng/L  Troponin I (High Sensitivity)  Result Value Ref Range   Troponin I (High Sensitivity) 4 <18 ng/L   Laboratory interpretation all normal except acute renal insufficiency, low  total white blood cell count consistent with viral illness    EKG EKG Interpretation  Date/Time:  Tuesday February 07 2020 23:12:42 EST Ventricular Rate:  101 PR Interval:  146 QRS Duration: 74 QT Interval:  340 QTC Calculation: 440 R Axis:   52 Text Interpretation: Sinus tachycardia Otherwise normal ECG Since last tracing rate faster 16 Dec 2019 Confirmed by Rolland Porter [phone]) on 02/07/2020 11:30:09 PM   Radiology DG Chest Port 1 View  Result Date: 02/08/2020 CLINICAL DATA:  Left-sided chest pain, fever, cough and chills. EXAM: PORTABLE CHEST 1 VIEW COMPARISON:  April 13, 2019 FINDINGS: Decreased lung volumes are seen which is likely secondary to the degree of patient inspiration. Very mild areas of atelectasis and/or early infiltrate are seen along the periphery of the  bilateral lung bases. There is no evidence of a pleural effusion or pneumothorax. The heart size and mediastinal contours are within normal limits. Multilevel degenerative changes are noted throughout the thoracic spine. Radiopaque surgical clips are seen overlying the right upper quadrant. IMPRESSION: Very mild bibasilar atelectasis and/or early infiltrate. Electronically Signed   By: Virgina Norfolk M.D.   On: 02/08/2020 00:21    Procedures .Critical Care Performed by: Rolland Porter, MD Authorized by: Rolland Porter, MD   Critical care provider statement:    Critical care time (minutes):  39   Critical care was necessary to treat or prevent imminent or life-threatening deterioration of the following conditions:  Respiratory failure   Critical care was time spent personally by me on the following activities:  Examination of patient, obtaining history from patient or surrogate, ordering and review of laboratory studies, ordering and review of radiographic studies, pulse oximetry and re-evaluation of patient's condition   Care discussed with: admitting provider     (including critical care time)  Medications Ordered in ED Medications  cefTRIAXone (ROCEPHIN) 1 g in sodium chloride 0.9 % 100 mL IVPB (1 g Intravenous New Bag/Given 02/08/20 0730)  azithromycin (ZITHROMAX) 500 mg in sodium chloride 0.9 % 250 mL IVPB (has no administration in time range)  sodium chloride 0.9 % bolus 1,000 mL (1,000 mLs Intravenous New Bag/Given 02/08/20 0651)  sodium chloride 0.9 % bolus 500 mL (500 mLs Intravenous New Bag/Given 02/08/20 0657)  ondansetron (ZOFRAN) injection 4 mg (4 mg Intravenous Given 02/08/20 [id number])    ED Course  I have reviewed the triage vital signs and the nursing notes.  Pertinent labs & imaging results that were available during my care of the patient were reviewed by me and considered in my medical decision making (see chart for details).    MDM Rules/Calculators/A&P                           When I went in to see patient her pulse ox was 96% on room air however after we talked a while her pulse ox dropped to 87%.  This was at about 6:07 AM.  Nursing staff was notified and patient was placed on oxygen.  She was tested for Covid, she was started on community-acquired pneumonia antibiotics due to her abnormal chest x-ray.  Additional laboratory tests was ordered.  Nursing staff document her oxygen got down to 85% on room air.  7:29 AM Dr. Wynetta Emery, hospitalist will admit.  7:32 AM patient's point-of-care Covid test is positive.  Kristi Martin was evaluated in Emergency Department on 02/08/2020 for the symptoms described in the history of present illness. She was evaluated in the  context of the global COVID-19 pandemic, which necessitated consideration that the patient might be at risk for infection with the SARS-CoV-2 virus that causes COVID-19. Institutional protocols and algorithms that pertain to the evaluation of patients at risk for COVID-19 are in a state of rapid change based on information released by regulatory bodies including the CDC and federal and state organizations. These policies and algorithms were followed during the patient's care in the ED.   Final Clinical Impression(s) / ED Diagnoses Final diagnoses:  Hypoxia  Syncope, unspecified syncope type  Pneumonia due to COVID-19 virus    Rx / DC Orders  Plan admission  Devoria Albe, MD, Concha Pyo, MD 02/08/20 [phone]

## 2020-02-08 NOTE — Plan of Care (Signed)

## 2020-02-08 NOTE — ED Notes (Signed)
Pt 85% on room air. Pt was placed on 2L [place]. O2 is now 100%. Will continue to monitor.

## 2020-02-08 NOTE — ED Notes (Signed)
Pt placed on purewick 

## 2020-02-08 NOTE — H&P (Addendum)
History and Physical  [place] [id number] DOB: Oct 08, 1970 DOA: 02/08/2020  PCP: Soyla Dryer, PA-C  Patient coming from: Home   I have personally briefly reviewed patient's old medical records in [place]  Chief Complaint: SOB  HPI: Kristi Martin is a 50 y.o. female with medical history significant for polysubstance abuse, generalized anxiety disorder, cervical cancer, chronic back pain, stage III CKD, peripheral neuropathy and other past medical history detailed below presented to the emergency department complaining of 1 day of cough, chest congestion, fever up to 102.  She says that she is vaccinated for Covid but has not been boosted.  She had the Moderna vaccine.  The patient reports that she had a syncopal episode when she was out shopping yesterday where she passed out.  She reports that she has had nausea and vomited twice.  She denies diarrhea.  She has poor appetite, malaise and is not eating or drinking well.  ED Course: Patient was hypoxic on arrival with a pulse ox of 84% on room air.  She was placed on 2 L nasal cannula with improvement in oxygen saturation to 97%.  Her chest x-ray had abnormal findings of bilateral  atelectasis and early infiltrate.  Her SARS 2 coronavirus test was positive.  Her WBC was 3.6.  Lactic acid 1.0, CRP 2.6.  D-dimer 0.41 fibrinogen 451.  LDH 236.  Potassium 3.4, creatinine 1.40, glucose 115, calcium 8.6.  Admission was requested for further management.  Review of Systems: Review of Systems  Constitutional: Positive for chills, diaphoresis, fever and malaise/fatigue.  HENT: Positive for congestion, sinus pain and sore throat. Negative for hearing loss and tinnitus.   Eyes: Negative.   Respiratory: Positive for cough, sputum production, shortness of breath and wheezing.   Cardiovascular: Negative.   Gastrointestinal: Positive for heartburn and nausea. Negative for blood in stool and melena.  Genitourinary:  Negative.   Musculoskeletal: Positive for joint pain and myalgias.  Skin: Negative.   Neurological: Positive for dizziness, loss of consciousness and weakness. Negative for focal weakness.  Endo/Heme/Allergies: Negative.   Psychiatric/Behavioral: Positive for depression. Negative for hallucinations, memory loss, substance abuse and suicidal ideas. The patient is not nervous/anxious and does not have insomnia.   All other systems reviewed and are negative.  Past Medical History:  Diagnosis Date  . Anemia   . Anxiety   . Asthma    as child  . Cancer ([place])    cervical cx at age 46  . Cervical cancer ([place])   . Chronic back pain   . Chronic kidney disease   . CKD (chronic kidney disease), symptom management only, stage 3 (moderate) ([place])   . DDD (degenerative disc disease), lumbar    Facet arthritis  . GERD (gastroesophageal reflux disease)   . Hypotension   . Left knee pain   . Migraine   . Peripheral neuropathy   . Sciatica   . Substance abuse ([place])   . Vaginal Pap smear, abnormal     Past Surgical History:  Procedure Laterality Date  . CERVICAL CONIZATION W/BX N/A 04/01/2017   Procedure: LASER CONIZATION CERVIX WITH BIOPSY;  Surgeon: Florian Buff, MD;  Location: [facility];  Service: Gynecology;  Laterality: N/A;  . CHOLECYSTECTOMY N/A 01/20/2013   Procedure: LAPAROSCOPIC CHOLECYSTECTOMY WITH INTRAOPERATIVE CHOLANGIOGRAM;  Surgeon: Shann Medal, MD;  Location: [facility];  Service: General;  Laterality: N/A;  . DILATATION AND CURETTAGE/HYSTEROSCOPY WITH MINERVA N/A 11/10/2018   Procedure: DILATATION AND  CURETTAGE/HYSTEROSCOPY WITH MINERVA;  Surgeon: Florian Buff, MD;  Location: [facility];  Service: Gynecology;  Laterality: N/A;  . TUBAL LIGATION       reports that she has never smoked. She has never used smokeless tobacco. She reports current alcohol use. She reports previous drug use. Drug: "Crack" cocaine.  Allergies  Allergen Reactions  . Benadryl [Diphenhydramine Hcl]  Anaphylaxis, Swelling and Other (See Comments)    Back of throat closes  . Onion Anaphylaxis    Family History  Problem Relation Age of Onset  . Cancer Mother   . Colon cancer Mother   . Breast cancer Maternal Grandmother   . Heart disease Maternal Grandmother   . Cancer Maternal Grandmother   . Breast cancer Paternal Grandmother   . Heart disease Paternal Grandmother   . Hypertension Paternal Grandmother   . Alcohol abuse Father   . Drug abuse Father   . Heart disease Father   . Asthma Brother   . Cancer Paternal Grandfather      Prior to Admission medications   Medication Sig Start Date End Date Taking? Authorizing Provider  albuterol (VENTOLIN HFA) 108 (90 Base) MCG/ACT inhaler Inhale 1-2 puffs into the lungs every 6 (six) hours as needed for wheezing or shortness of breath. 04/13/19   Petrucelli, Samantha R, PA-C  amitriptyline (ELAVIL) 50 MG tablet Take 50 mg by mouth at bedtime.    [provider]  cholecalciferol (VITAMIN D) 1000 units tablet Take 1,000 Units by mouth daily. Patient not taking: Reported on 12/21/2019    [provider]  cyclobenzaprine (FLEXERIL) 10 MG tablet Take 1 tablet (10 mg total) by mouth 3 (three) times daily as needed for muscle spasms. Patient not taking: Reported on 11/16/2019 10/13/19   Soyla Dryer, PA-C  dicyclomine (BENTYL) 20 MG tablet Take 1 tablet (20 mg total) by mouth 3 (three) times daily as needed for spasms (abdominal cramping). 12/16/19   Long, Wonda Olds, MD  Ferrous Sulfate (IRON PO) Take 1 tablet by mouth daily.    [provider]  Multiple Vitamin (MULTIVITAMIN WITH MINERALS) TABS tablet Take 1 tablet by mouth daily. WOMEN'S ONE A DAY    [provider]  ondansetron (ZOFRAN ODT) 4 MG disintegrating tablet Take 1 tablet (4 mg total) by mouth every 8 (eight) hours as needed. 12/16/19   Long, Wonda Olds, MD  oxybutynin (DITROPAN) 5 MG tablet Take 1 tablet (5 mg total) by mouth 2 (two) times daily.  09/20/19   Soyla Dryer, PA-C  predniSONE (DELTASONE) 20 MG tablet Take 1 tablet (20 mg total) by mouth 2 (two) times daily with a meal. 12/21/19   Soyla Dryer, PA-C  Probiotic Product (PROBIOTIC PO) Take 1 tablet by mouth daily.    [provider]    Physical Exam: Vitals:   02/07/20 2329 02/08/20 0603 02/08/20 0637 02/08/20 0708  BP:   116/70 99/70  Pulse:  95 88 92  Resp:   18 17  Temp:    98.7 F (37.1 C)  TempSrc:    Oral  SpO2:  96% (!) 85% 97%  Weight: 127.9 kg     Height: 5\' 6"  (1.676 m)      Constitutional: Appears chronically ill lying in the bed, easily arousable, NAD, calm, comfortable Eyes: PERRL, lids and conjunctivae normal ENMT: Mucous membranes are moist. Posterior pharynx clear of any exudate or lesions.  Neck: normal, supple, no masses, no thyromegaly Respiratory: shallow BS bilateral, no wheezing, no crackles. Normal respiratory effort.  No accessory muscle use.  Cardiovascular: Regular rate and rhythm, no murmurs / rubs / gallops. No extremity edema. 2+ pedal pulses. No carotid bruits.  Abdomen: no tenderness, no masses palpated. No hepatosplenomegaly. Bowel sounds positive.  Musculoskeletal: no clubbing / cyanosis. No joint deformity upper and lower extremities. Good ROM, no contractures. Normal muscle tone.  Skin: no rashes, lesions, ulcers. No induration Neurologic: CN 2-12 grossly intact. Sensation intact, DTR normal. Strength 5/5 in all 4.  Psychiatric: Normal judgment and insight. Alert and oriented x 3. Normal mood.   Labs on Admission: I have personally reviewed following labs and imaging studies  CBC: Recent Labs  Lab 02/08/20 0204  WBC 3.6*  HGB 12.9  HCT 41.9  MCV 86.9  PLT [id number]   Basic Metabolic Panel: Recent Labs  Lab 02/08/20 0204  NA 139  K 3.4*  CL 102  CO2 27  GLUCOSE 115*  BUN 12  CREATININE 1.40*  CALCIUM 8.6*   GFR: Estimated Creatinine Clearance: 65.1 mL/min (A) (by C-G formula based on SCr of 1.4 mg/dL  (H)). Liver Function Tests: No results for input(s): AST, ALT, ALKPHOS, BILITOT, PROT, ALBUMIN in the last 168 hours. No results for input(s): LIPASE, AMYLASE in the last 168 hours. No results for input(s): AMMONIA in the last 168 hours. Coagulation Profile: No results for input(s): INR, PROTIME in the last 168 hours. Cardiac Enzymes: No results for input(s): CKTOTAL, CKMB, CKMBINDEX, TROPONINI in the last 168 hours. BNP (last 3 results) No results for input(s): PROBNP in the last 8760 hours. HbA1C: No results for input(s): HGBA1C in the last 72 hours. CBG: No results for input(s): GLUCAP in the last 168 hours. Lipid Profile: Recent Labs    02/08/20 0557  TRIG 98   Thyroid Function Tests: No results for input(s): TSH, T4TOTAL, FREET4, T3FREE, THYROIDAB in the last 72 hours. Anemia Panel: Recent Labs    02/08/20 0557  FERRITIN 32   Urine analysis:    Component Value Date/Time   COLORURINE YELLOW 12/17/2019 1531   APPEARANCEUR CLEAR 12/17/2019 1531   LABSPEC 1.021 12/17/2019 1531   PHURINE 6.0 12/17/2019 1531   GLUCOSEU NEGATIVE 12/17/2019 1531   HGBUR SMALL (A) 12/17/2019 1531   BILIRUBINUR NEGATIVE 12/17/2019 1531   BILIRUBINUR NEG 08/18/2019 1001   KETONESUR NEGATIVE 12/17/2019 1531   PROTEINUR 30 (A) 12/17/2019 1531   UROBILINOGEN 0.2 08/18/2019 1001   UROBILINOGEN 0.2 10/05/2014 1308   NITRITE NEGATIVE 12/17/2019 1531   LEUKOCYTESUR NEGATIVE 12/17/2019 1531    Radiological Exams on Admission: DG Chest Port 1 View  Result Date: 02/08/2020 CLINICAL DATA:  Left-sided chest pain, fever, cough and chills. EXAM: PORTABLE CHEST 1 VIEW COMPARISON:  April 13, 2019 FINDINGS: Decreased lung volumes are seen which is likely secondary to the degree of patient inspiration. Very mild areas of atelectasis and/or early infiltrate are seen along the periphery of the bilateral lung bases. There is no evidence of a pleural effusion or pneumothorax. The heart size and mediastinal  contours are within normal limits. Multilevel degenerative changes are noted throughout the thoracic spine. Radiopaque surgical clips are seen overlying the right upper quadrant. IMPRESSION: Very mild bibasilar atelectasis and/or early infiltrate. Electronically Signed   By: Virgina Norfolk M.D.   On: 02/08/2020 00:21    Assessment/Plan Principal Problem:   Pneumonia due to COVID-19 virus Active Problems:   Chronic anemia   CKD (chronic kidney disease) stage 3, GFR 30-59 ml/min (HCC)   Peripheral neuropathy   Obesity   Chronic back  pain   GERD (gastroesophageal reflux disease)   DDD (degenerative disc disease), lumbar   GAD (generalized anxiety disorder)   Substance abuse (HCC)   Pneumonia  1. Acute respiratory failure with hypoxia secondary to Covid pneumonia-patient presents with some infiltrates on chest x-ray and hypoxia requiring supplemental oxygen.  Patient has been started on remdesivir per pharmacy and the Covid treatment protocol has been started with Solu-Medrol and supplemental vitamins and oxygen and will continue to follow clinical course.  Follow and trend inflammatory markers.  Placed under observation.  If improves, possibly discharge home tomorrow to complete remdesivir outpatient.    2. H/o of polysubstance abuse - checking urine toxicology screen.  3. GERD - protonix ordered for GI protection.  4. CKD - stable now, following creatinine.  5. AKI - prerenal from poor oral intake, treating with IV hydration, recheck in AM.  6. Hypokalemia - oral replacement given, recheck in AM.   DVT prophylaxis: enoxaparin   Code Status: Full   Family Communication: patient updated with plan of care and verbalized understanding  Disposition Plan: Home   Consults called: n/a  Admission status: OBV   Coltin Casher MD [facility] How to contact the [facility] Attending or Consulting provider 7A - 7P or covering provider during after hours 7P -7A, for this patient?  1. Check the  care team in [facility] and look for a) attending/consulting TR[facility] provider listed and b) the [facility] team listed 2. Log into [email or web address] and use [place]'s universal password to access. If you do not have the password, please contact the hospital operator. 3. Locate the [facility] provider you are looking for under [facility] and page to a number that you can be directly reached. 4. If you still have difficulty reaching the provider, please page the [facility] (Director on Call) for the Hospitalists listed on amion for assistance.   If 7PM-7AM, please contact night-coverage [email or web address] Password [facility]  02/08/2020, 8:13 AM

## 2020-02-09 ENCOUNTER — Observation Stay (HOSPITAL_COMMUNITY): Payer: HRSA Program

## 2020-02-09 DIAGNOSIS — J1282 Pneumonia due to coronavirus disease 2019: Secondary | ICD-10-CM

## 2020-02-09 DIAGNOSIS — U071 COVID-19: Secondary | ICD-10-CM

## 2020-02-09 DIAGNOSIS — F1910 Other psychoactive substance abuse, uncomplicated: Secondary | ICD-10-CM | POA: Diagnosis not present

## 2020-02-09 LAB — CBC WITH DIFFERENTIAL/PLATELET
Abs Immature Granulocytes: 0.01 10*3/uL (ref 0.00–0.07)
Basophils Absolute: 0 10*3/uL (ref 0.0–0.1)
Basophils Relative: 0 %
Eosinophils Absolute: 0 10*3/uL (ref 0.0–0.5)
Eosinophils Relative: 0 %
HCT: 37.2 % (ref 36.0–46.0)
Hemoglobin: 11.3 g/dL — ABNORMAL LOW (ref 12.0–15.0)
Immature Granulocytes: 0 %
Lymphocytes Relative: 23 %
Lymphs Abs: 0.8 10*3/uL (ref 0.7–4.0)
MCH: 26.7 pg (ref 26.0–34.0)
MCHC: 30.4 g/dL (ref 30.0–36.0)
MCV: 87.7 fL (ref 80.0–100.0)
Monocytes Absolute: 0.5 10*3/uL (ref 0.1–1.0)
Monocytes Relative: 14 %
Neutro Abs: 2.1 10*3/uL (ref 1.7–7.7)
Neutrophils Relative %: 63 %
Platelets: 246 10*3/uL (ref 150–400)
RBC: 4.24 MIL/uL (ref 3.87–5.11)
RDW: 16.2 % — ABNORMAL HIGH (ref 11.5–15.5)
WBC: 3.4 10*3/uL — ABNORMAL LOW (ref 4.0–10.5)
nRBC: 0 % (ref 0.0–0.2)

## 2020-02-09 LAB — COMPREHENSIVE METABOLIC PANEL
ALT: 42 U/L (ref 0–44)
AST: 30 U/L (ref 15–41)
Albumin: 2.8 g/dL — ABNORMAL LOW (ref 3.5–5.0)
Alkaline Phosphatase: 66 U/L (ref 38–126)
Anion gap: 8 (ref 5–15)
BUN: 10 mg/dL (ref 6–20)
CO2: 24 mmol/L (ref 22–32)
Calcium: 8 mg/dL — ABNORMAL LOW (ref 8.9–10.3)
Chloride: 111 mmol/L (ref 98–111)
Creatinine, Ser: 1.05 mg/dL — ABNORMAL HIGH (ref 0.44–1.00)
GFR, Estimated: >60 mL/min (ref >60)
Glucose, Bld: 166 mg/dL — ABNORMAL HIGH (ref 70–99)
Potassium: 3.9 mmol/L (ref 3.5–5.1)
Sodium: 143 mmol/L (ref 135–145)
Total Bilirubin: 0.2 mg/dL — ABNORMAL LOW (ref 0.3–1.2)
Total Protein: 6.4 g/dL — ABNORMAL LOW (ref 6.5–8.1)

## 2020-02-09 LAB — C-REACTIVE PROTEIN: CRP: 1.5 mg/dL — ABNORMAL HIGH (ref <1.0)

## 2020-02-09 LAB — FERRITIN: Ferritin: 32 ng/mL (ref 11–307)

## 2020-02-09 LAB — PHOSPHORUS: Phosphorus: 2.3 mg/dL — ABNORMAL LOW (ref 2.5–4.6)

## 2020-02-09 LAB — D-DIMER, QUANTITATIVE: D-Dimer, Quant: 0.48 ug/mL-FEU (ref 0.00–0.50)

## 2020-02-09 LAB — MAGNESIUM: Magnesium: 2.1 mg/dL (ref 1.7–2.4)

## 2020-02-09 MED ORDER — GUAIFENESIN-DM 100-10 MG/5ML PO SYRP: 10.0000 mL | ORAL_SOLUTION | ORAL | 0 refills | Status: DC | PRN

## 2020-02-09 MED ORDER — PANTOPRAZOLE SODIUM 40 MG PO TBEC: 40.0000 mg | DELAYED_RELEASE_TABLET | Freq: Every day | ORAL | 0 refills | Status: DC

## 2020-02-09 MED ORDER — IPRATROPIUM-ALBUTEROL 20-100 MCG/ACT IN AERS
1.0000 | INHALATION_SPRAY | Freq: Two times a day (BID) | RESPIRATORY_TRACT | Status: DC
  Administered 2020-02-09: 1 via RESPIRATORY_TRACT

## 2020-02-09 MED ORDER — PREDNISONE 50 MG PO TABS: 50.0000 mg | ORAL_TABLET | Freq: Every day | ORAL | 0 refills | Status: DC

## 2020-02-09 MED ORDER — ALBUTEROL SULFATE HFA 108 (90 BASE) MCG/ACT IN AERS: 2.0000 | INHALATION_SPRAY | RESPIRATORY_TRACT | Status: DC | PRN

## 2020-02-09 MED ORDER — SALINE SPRAY 0.65 % NA SOLN: 1.0000 | NASAL | Status: DC | PRN

## 2020-02-09 MED ORDER — ALBUTEROL SULFATE HFA 108 (90 BASE) MCG/ACT IN AERS: 1.0000 | INHALATION_SPRAY | Freq: Four times a day (QID) | RESPIRATORY_TRACT | 0 refills | Status: AC | PRN

## 2020-02-09 NOTE — Plan of Care (Signed)
Problem: Education: Goal: Knowledge of General Education information will improve Description: Including pain rating scale, medication(s)/side effects and non-pharmacologic comfort measures 02/09/2020 1417 by Sheela Stack, RN Outcome: Adequate for Discharge 02/09/2020 0740 by Sheela Stack, RN Outcome: Progressing   Problem: Health Behavior/Discharge Planning: Goal: Ability to manage health-related needs will improve 02/09/2020 1417 by Sheela Stack, RN Outcome: Adequate for Discharge 02/09/2020 0740 by Sheela Stack, RN Outcome: Progressing   Problem: Clinical Measurements: Goal: Ability to maintain clinical measurements within normal limits will improve 02/09/2020 1417 by Sheela Stack, RN Outcome: Adequate for Discharge 02/09/2020 0740 by Sheela Stack, RN Outcome: Progressing Goal: Will remain free from infection 02/09/2020 1417 by Sheela Stack, RN Outcome: Adequate for Discharge 02/09/2020 0740 by Sheela Stack, RN Outcome: Progressing Goal: Diagnostic test results will improve 02/09/2020 1417 by Sheela Stack, RN Outcome: Adequate for Discharge 02/09/2020 0740 by Sheela Stack, RN Outcome: Progressing Goal: Respiratory complications will improve 02/09/2020 1417 by Sheela Stack, RN Outcome: Adequate for Discharge 02/09/2020 0740 by Sheela Stack, RN Outcome: Progressing Goal: Cardiovascular complication will be avoided 02/09/2020 1417 by Sheela Stack, RN Outcome: Adequate for Discharge 02/09/2020 0740 by Sheela Stack, RN Outcome: Progressing   Problem: Activity: Goal: Risk for activity intolerance will decrease 02/09/2020 1417 by Sheela Stack, RN Outcome: Adequate for Discharge 02/09/2020 0740 by Sheela Stack, RN Outcome: Progressing   Problem: Nutrition: Goal: Adequate nutrition will be maintained 02/09/2020 1417 by Sheela Stack, RN Outcome: Adequate for Discharge 02/09/2020 0740 by Sheela Stack, RN Outcome: Progressing   Problem:  Coping: Goal: Level of anxiety will decrease 02/09/2020 1417 by Sheela Stack, RN Outcome: Adequate for Discharge 02/09/2020 0740 by Sheela Stack, RN Outcome: Progressing   Problem: Elimination: Goal: Will not experience complications related to bowel motility 02/09/2020 1417 by Sheela Stack, RN Outcome: Adequate for Discharge 02/09/2020 0740 by Sheela Stack, RN Outcome: Progressing Goal: Will not experience complications related to urinary retention 02/09/2020 1417 by Sheela Stack, RN Outcome: Adequate for Discharge 02/09/2020 0740 by Sheela Stack, RN Outcome: Progressing   Problem: Pain Managment: Goal: General experience of comfort will improve 02/09/2020 1417 by Sheela Stack, RN Outcome: Adequate for Discharge 02/09/2020 0740 by Sheela Stack, RN Outcome: Progressing   Problem: Safety: Goal: Ability to remain free from injury will improve 02/09/2020 1417 by Sheela Stack, RN Outcome: Adequate for Discharge 02/09/2020 0740 by Sheela Stack, RN Outcome: Progressing   Problem: Skin Integrity: Goal: Risk for impaired skin integrity will decrease 02/09/2020 1417 by Sheela Stack, RN Outcome: Adequate for Discharge 02/09/2020 0740 by Sheela Stack, RN Outcome: Progressing   Problem: Education: Goal: Knowledge of risk factors and measures for prevention of condition will improve 02/09/2020 1417 by Sheela Stack, RN Outcome: Adequate for Discharge 02/09/2020 0740 by Sheela Stack, RN Outcome: Progressing   Problem: Coping: Goal: Psychosocial and spiritual needs will be supported 02/09/2020 1417 by Sheela Stack, RN Outcome: Adequate for Discharge 02/09/2020 0740 by Sheela Stack, RN Outcome: Progressing   Problem: Respiratory: Goal: Will maintain a patent airway 02/09/2020 1417 by Sheela Stack, RN Outcome: Adequate for Discharge 02/09/2020 0740 by Sheela Stack, RN Outcome: Progressing Goal: Complications related to the disease process, condition or  treatment will be avoided or minimized 02/09/2020 1417 by Sheela Stack, RN Outcome: Adequate for Discharge 02/09/2020 0740 by Sheela Stack, RN Outcome:  Progressing

## 2020-02-09 NOTE — Discharge Instructions (Signed)
You are scheduled for an outpatient Remdesivir infusion at 4:30pm on Friday 1/7, Saturday 1/8, and Monday 1/9 at [facility]. Please park at [address] [place], [place], as staff will be escorting you through the east entrance of the hospital. Appointments take approximately 45 minutes.    The address for the infusion clinic site is:  --GPS address is [address] - the parking is located near [employer] building where you will see  COVID19 Infusion feather banner marking the entrance to parking.   (see photos below)            --Enter into the 2nd entrance where the "wave, flag banner" is at the road. Turn into this 2nd entrance and immediately turn left to park in 1 of the 5 parking spots.   --Please stay in your car and call the desk for assistance inside [phone].   The day of your visit you should: Marland Kitchen Get plenty of rest the night before and drink plenty of water . Eat a light meal/snack before coming and take your medications as prescribed  . Wear warm, comfortable clothes with a shirt that can roll-up over the elbow (will need IV start).  . Wear a mask  . Consider bringing some activity to help pass the time  You will have to quarantine until 02/28/20

## 2020-02-09 NOTE — TOC Initial Note (Signed)
Transition of Care [facility]) - Initial/Assessment Note    Patient Details  Name: Kristi Martin MRN: [id number] Date of Birth: April 28, 1970  Transition of Care [facility]) CM/SW Contact:    Annice Needy, LCSW Phone Number: 02/09/2020, 2:16 PM  Clinical Narrative:                 Patient from home. Admitted Covid+. Uninsured. Has PCP. In need of home oxygen. Charity oxygen provided by [employer].   Expected Discharge Plan: Home/Self Care Barriers to Discharge: No Barriers Identified   Patient Goals and CMS Choice Patient states their goals for this hospitalization and ongoing recovery are:: return home      Expected Discharge Plan and Services Expected Discharge Plan: Home/Self Care     Post Acute Care Choice: Durable Medical Equipment Living arrangements for the past 2 months: Single Family Home                   DME Agency: [employer] Date DME Agency Contacted: 02/09/20 Time DME Agency Contacted: [phone] Representative spoke with at DME Agency: [facility] [employer]            Prior Living Arrangements/Services Living arrangements for the past 2 months: Single Family Home   Patient language and need for interpreter reviewed:: Yes Do you feel safe going back to the place where you live?: Yes      Need for Family Participation in Patient Care: Yes (Comment) Care giver support system in place?: Yes (comment)   Criminal Activity/Legal Involvement Pertinent to Current Situation/Hospitalization: No - Comment as needed  Activities of Daily Living      Permission Sought/Granted                  Emotional Assessment     Affect (typically observed): Appropriate Orientation: : Oriented to Self,Oriented to Place,Oriented to  Time,Oriented to Situation Alcohol / Substance Use: Not Applicable Psych Involvement: No (comment)  Admission diagnosis:  Shortness of breath [R06.02] Pneumonia [J18.9] Hypoxia [R09.02] Chest pain [R07.9] Syncope, unspecified syncope type [R55] Pneumonia due  to COVID-19 virus [U07.1, J12.82] Patient Active Problem List   Diagnosis Date Noted  . Pneumonia 02/08/2020  . Pneumonia due to COVID-19 virus 02/08/2020  . Substance abuse (HCC) 05/27/2016  . MDD (major depressive disorder) 06/08/2015  . GAD (generalized anxiety disorder) 02/07/2015  . DDD (degenerative disc disease), lumbar 10/10/2014  . Facet arthropathy, lumbar 10/10/2014  . HGSIL (high grade squamous intraepithelial lesion) on Pap smear of cervix 08/04/2014  . Chronic back pain 07/27/2014  . GERD (gastroesophageal reflux disease) 07/27/2014  . Peripheral neuropathy 06/28/2014  . Obesity 06/28/2014  . Glucose intolerance (impaired glucose tolerance) 04/03/2014  . Chronic anemia 04/01/2014  . CKD (chronic kidney disease) stage 3, GFR 30-59 ml/min (HCC) 04/01/2014   PCP:  Jacquelin Hawking, PA-C Pharmacy:   [facility] [address] [place] - [address] [place] [address] Phone: [phone] Fax: [phone]     Social Determinants of Health (SDOH) Interventions    Readmission Risk Interventions No flowsheet data found.

## 2020-02-09 NOTE — Progress Notes (Signed)
Patient scheduled for outpatient Remdesivir infusions at 4:30pm on Friday 1/7, Saturday 1/8, and Monday 1/10 at [facility]. Please inform the patient to park at [place], as staff will be escorting the patient through the [place] entrance of the hospital. Appointments take approximately 45 minutes.    There is a wave flag banner located near the entrance on [address]. Turn into this entrance and immediately turn left or right and park in 1 of the 10 designated Covid Infusion Parking spots. There is a phone number on the sign, please call and let the staff know what spot you are in and we will come out and get you. For questions call [phone].  Thanks.

## 2020-02-09 NOTE — Progress Notes (Signed)
SATURATION QUALIFICATIONS: (This note is used to comply with regulatory documentation for home oxygen)  Patient Saturations on Room Air at Rest = 93%  Patient Saturations on Room Air while Ambulating = 87%  Patient Saturations on 2 Liters of oxygen while Ambulating = 91%  Please briefly explain why patient needs home oxygen:to maintain O2 sats greater than 90% on room air.  Manya Silvas, RN

## 2020-02-09 NOTE — Plan of Care (Signed)

## 2020-02-09 NOTE — Discharge Summary (Signed)
Physician Discharge Summary  Kristi Martin HYQ:[id number] DOB: 1970-08-28 DOA: 02/08/2020  PCP: Jacquelin Hawking, PA-C  Admit date: 02/08/2020 Discharge date: 02/09/2020  Admitted From: Home Disposition: Home  Recommendations for Outpatient Follow-up:  1. Follow up with PCP in 1-2 weeks 2. Please obtain BMP/CBC in one week 3. Patient has been set up with the infusion center to complete her course of remdesivir  Home Health: Oxygen at 2 L Equipment/Devices:  Discharge Condition: Stable CODE STATUS:full code Diet recommendation: Heart healthy  Brief/Interim Summary: HPI: Kristi Martin is a 50 y.o. female with medical history significant for polysubstance abuse, generalized anxiety disorder, cervical cancer, chronic back pain, stage III CKD, peripheral neuropathy and other past medical history detailed below presented to the emergency department complaining of 1 day of cough, chest congestion, fever up to 102.  She says that she is vaccinated for Covid but has not been boosted.  She had the Moderna vaccine.  The patient reports that she had a syncopal episode when she was out shopping yesterday where she passed out.  She reports that she has had nausea and vomited twice.  She denies diarrhea.  She has poor appetite, malaise and is not eating or drinking well.  Discharge Diagnoses:  Principal Problem:   Pneumonia due to COVID-19 virus Active Problems:   Chronic anemia   CKD (chronic kidney disease) stage 3, GFR 30-59 ml/min (HCC)   Peripheral neuropathy   Obesity   Chronic back pain   GERD (gastroesophageal reflux disease)   DDD (degenerative disc disease), lumbar   GAD (generalized anxiety disorder)   Substance abuse (HCC)   Pneumonia  1.  Acute respiratory failure with hypoxia secondary to COVID-19 pneumonia.  Patient was treated with intravenous remdesivir and steroids.  Overall respiratory status has improved.  Inflammatory markers are trending down.  She has been transitioned  to a course of prednisone.  She will complete her remdesivir as an outpatient.  She will be discharged home with 2 L of oxygen which can be further titrated down as an outpatient   2.acute kidney injury on chronic kidney disease.  Overall renal function has normalized with IV hydration.  3.  Hypokalemia.  Replaced    Discharge Instructions  Discharge Instructions    Diet - low sodium heart healthy   Complete by: As directed    Increase activity slowly   Complete by: As directed      Allergies as of 02/09/2020      Reactions   Benadryl [diphenhydramine Hcl] Anaphylaxis, Swelling, Other (See Comments)   Back of throat closes   Onion Anaphylaxis      Medication List    STOP taking these medications   cyclobenzaprine 10 MG tablet Commonly known as: FLEXERIL     TAKE these medications   albuterol 108 (90 Base) MCG/ACT inhaler Commonly known as: VENTOLIN HFA Inhale 1-2 puffs into the lungs every 6 (six) hours as needed for wheezing or shortness of breath.   dicyclomine 20 MG tablet Commonly known as: BENTYL Take 1 tablet (20 mg total) by mouth 3 (three) times daily as needed for spasms (abdominal cramping).   guaiFENesin-dextromethorphan 100-10 MG/5ML syrup Commonly known as: ROBITUSSIN DM Take 10 mLs by mouth every 4 (four) hours as needed for cough.   IRON PO Take 1 tablet by mouth daily.   multivitamin with minerals Tabs tablet Take 1 tablet by mouth daily. WOMEN'S ONE A DAY   ondansetron 4 MG disintegrating tablet Commonly known as: Zofran ODT  Take 1 tablet (4 mg total) by mouth every 8 (eight) hours as needed.   oxybutynin 5 MG tablet Commonly known as: DITROPAN Take 1 tablet (5 mg total) by mouth 2 (two) times daily.   pantoprazole 40 MG tablet Commonly known as: PROTONIX Take 1 tablet (40 mg total) by mouth daily. Start taking on: February 10, 2020   predniSONE 50 MG tablet Commonly known as: DELTASONE Take 1 tablet (50 mg total) by mouth daily. Start  taking on: February 11, 2020 What changed:   medication strength  how much to take  when to take this  These instructions start on February 11, 2020. If you are unsure what to do until then, ask your doctor or other care provider.   PROBIOTIC PO Take 1 tablet by mouth daily.       Allergies  Allergen Reactions  . Benadryl [Diphenhydramine Hcl] Anaphylaxis, Swelling and Other (See Comments)    Back of throat closes  . Onion Anaphylaxis    Consultations:     Procedures/Studies: Portable chest 1 View  Result Date: 02/09/2020 CLINICAL DATA:  Shortness of breath.  COVID positive EXAM: PORTABLE CHEST 1 VIEW COMPARISON:  Yesterday FINDINGS: Patchy bilateral pneumonia which has progressed from yesterday. Low lung volumes. Stable heart size and mediastinal contours. No visible effusion or pneumothorax. IMPRESSION: Low volume chest with mildly progressive bilateral pneumonia. Electronically Signed   By: Monte Fantasia M.D.   On: 02/09/2020 05:34   DG Chest Port 1 View  Result Date: 02/08/2020 CLINICAL DATA:  Left-sided chest pain, fever, cough and chills. EXAM: PORTABLE CHEST 1 VIEW COMPARISON:  April 13, 2019 FINDINGS: Decreased lung volumes are seen which is likely secondary to the degree of patient inspiration. Very mild areas of atelectasis and/or early infiltrate are seen along the periphery of the bilateral lung bases. There is no evidence of a pleural effusion or pneumothorax. The heart size and mediastinal contours are within normal limits. Multilevel degenerative changes are noted throughout the thoracic spine. Radiopaque surgical clips are seen overlying the right upper quadrant. IMPRESSION: Very mild bibasilar atelectasis and/or early infiltrate. Electronically Signed   By: Virgina Norfolk M.D.   On: 02/08/2020 00:21       Subjective: Feeling better today.  She does have some mild epistaxis, overall shortness of breath improving  Discharge Exam: Vitals:   02/08/20 2100  02/09/20 0535 02/09/20 0831 02/09/20 1345  BP: 105/75 (!) 148/90  (!) 114/95  Pulse: 92 83  85  Resp: 20 20  (!) 22  Temp: 98.1 F (36.7 C) 98.2 F (36.8 C)    TempSrc: Oral Oral    SpO2: 90% 97% 96% 99%  Weight:      Height:        General: Pt is alert, awake, not in acute distress Cardiovascular: RRR, S1/S2 +, no rubs, no gallops Respiratory: CTA bilaterally, no wheezing, no rhonchi Abdominal: Soft, NT, ND, bowel sounds + Extremities: no edema, no cyanosis    The results of significant diagnostics from this hospitalization (including imaging, microbiology, ancillary and laboratory) are listed below for reference.     Microbiology: Recent Results (from the past 240 hour(s))  Blood culture (routine x 2)     Status: None (Preliminary result)   Collection Time: 02/08/20  6:08 AM   Specimen: BLOOD RIGHT ARM  Result Value Ref Range Status   Specimen Description BLOOD RIGHT ARM  Final   Special Requests   Final    BOTTLES DRAWN AEROBIC AND ANAEROBIC  Blood Culture adequate volume   Culture   Final    NO GROWTH 1 DAY Performed at [facility], [address], [place], [place] [address]    Report Status PENDING  Incomplete  Blood culture (routine x 2)     Status: None (Preliminary result)   Collection Time: 02/08/20  6:13 AM   Specimen: BLOOD  Result Value Ref Range Status   Specimen Description BLOOD LEFT ANTECUBITAL  Final   Special Requests   Final    BOTTLES DRAWN AEROBIC AND ANAEROBIC Blood Culture adequate volume   Culture   Final    NO GROWTH 1 DAY Performed at [facility], [address] [place], [place] [address]    Report Status PENDING  Incomplete     Labs: BNP (last 3 results) Recent Labs    04/13/19 1318  BNP 123XX123   Basic Metabolic Panel: Recent Labs  Lab 02/08/20 0204 02/09/20 0750  NA 139 143  K 3.4* 3.9  CL 102 111  CO2 27 24  GLUCOSE 115* 166*  BUN 12 10  CREATININE 1.40* 1.05*  CALCIUM 8.6* 8.0*  MG  --  2.1  PHOS  --  2.3*   Liver  Function Tests: Recent Labs  Lab 02/09/20 0750  AST 30  ALT 42  ALKPHOS 66  BILITOT 0.2*  PROT 6.4*  ALBUMIN 2.8*   No results for input(s): LIPASE, AMYLASE in the last 168 hours. No results for input(s): AMMONIA in the last 168 hours. CBC: Recent Labs  Lab 02/08/20 0204 02/09/20 0750  WBC 3.6* 3.4*  NEUTROABS  --  2.1  HGB 12.9 11.3*  HCT 41.9 37.2  MCV 86.9 87.7  PLT 244 246   Cardiac Enzymes: No results for input(s): CKTOTAL, CKMB, CKMBINDEX, TROPONINI in the last 168 hours. BNP: Invalid input(s): POCBNP CBG: No results for input(s): GLUCAP in the last 168 hours. D-Dimer Recent Labs    02/08/20 0557 02/09/20 0750  DDIMER 0.41 0.48   Hgb A1c No results for input(s): HGBA1C in the last 72 hours. Lipid Profile Recent Labs    02/08/20 0557  TRIG 98   Thyroid function studies No results for input(s): TSH, T4TOTAL, T3FREE, THYROIDAB in the last 72 hours.  Invalid input(s): FREET3 Anemia work up Recent Labs    02/08/20 0557 02/09/20 0750  FERRITIN 32 32   Urinalysis    Component Value Date/Time   COLORURINE YELLOW 12/17/2019 1531   APPEARANCEUR CLEAR 12/17/2019 1531   LABSPEC 1.021 12/17/2019 1531   PHURINE 6.0 12/17/2019 1531   GLUCOSEU NEGATIVE 12/17/2019 1531   HGBUR SMALL (A) 12/17/2019 1531   BILIRUBINUR NEGATIVE 12/17/2019 1531   BILIRUBINUR NEG 08/18/2019 1001   KETONESUR NEGATIVE 12/17/2019 1531   PROTEINUR 30 (A) 12/17/2019 1531   UROBILINOGEN 0.2 08/18/2019 1001   UROBILINOGEN 0.2 10/05/2014 1308   NITRITE NEGATIVE 12/17/2019 1531   LEUKOCYTESUR NEGATIVE 12/17/2019 1531   Sepsis Labs Invalid input(s): PROCALCITONIN,  WBC,  LACTICIDVEN Microbiology Recent Results (from the past 240 hour(s))  Blood culture (routine x 2)     Status: None (Preliminary result)   Collection Time: 02/08/20  6:08 AM   Specimen: BLOOD RIGHT ARM  Result Value Ref Range Status   Specimen Description BLOOD RIGHT ARM  Final   Special Requests   Final     BOTTLES DRAWN AEROBIC AND ANAEROBIC Blood Culture adequate volume   Culture   Final    NO GROWTH 1 DAY Performed at [facility], [address],  [place], [place] [address]    Report Status PENDING  Incomplete  Blood culture (routine x 2)     Status: None (Preliminary result)   Collection Time: 02/08/20  6:13 AM   Specimen: BLOOD  Result Value Ref Range Status   Specimen Description BLOOD LEFT ANTECUBITAL  Final   Special Requests   Final    BOTTLES DRAWN AEROBIC AND ANAEROBIC Blood Culture adequate volume   Culture   Final    NO GROWTH 1 DAY Performed at [facility], [address], [place], [place] [address]    Report Status PENDING  Incomplete     Time coordinating discharge: 33mins  SIGNED:   Kathie Dike, MD  [facility] 02/09/2020, 11:17 PM   If 7PM-7AM, please contact night-coverage [email or web address]

## 2020-02-10 ENCOUNTER — Ambulatory Visit (HOSPITAL_COMMUNITY)
Admit: 2020-02-10 | Discharge: 2020-02-10 | Disposition: A | Discharge status: Home / Self Care | Payer: Medicaid Other | Source: Ambulatory Visit | Attending: Pulmonary Disease | Admitting: Pulmonary Disease

## 2020-02-10 DIAGNOSIS — U071 COVID-19: Secondary | ICD-10-CM | POA: Insufficient documentation

## 2020-02-10 DIAGNOSIS — J1282 Pneumonia due to coronavirus disease 2019: Secondary | ICD-10-CM | POA: Insufficient documentation

## 2020-02-10 MED ORDER — FAMOTIDINE IN NACL 20-0.9 MG/50ML-% IV SOLN: 20.0000 mg | Freq: Once | INTRAVENOUS | Status: DC | PRN

## 2020-02-10 MED ORDER — SODIUM CHLORIDE 0.9 % IV SOLN
100.0000 mg | Freq: Once | INTRAVENOUS | Status: AC
  Administered 2020-02-10: 100 mg via INTRAVENOUS

## 2020-02-10 MED ORDER — SODIUM CHLORIDE 0.9 % IV SOLN: INTRAVENOUS | Status: DC | PRN

## 2020-02-10 MED ORDER — METHYLPREDNISOLONE SODIUM SUCC 125 MG IJ SOLR: 125.0000 mg | Freq: Once | INTRAMUSCULAR | Status: DC | PRN

## 2020-02-10 MED ORDER — ALBUTEROL SULFATE HFA 108 (90 BASE) MCG/ACT IN AERS: 2.0000 | INHALATION_SPRAY | Freq: Once | RESPIRATORY_TRACT | Status: DC | PRN

## 2020-02-10 MED ORDER — EPINEPHRINE 0.3 MG/0.3ML IJ SOAJ: 0.3000 mg | Freq: Once | INTRAMUSCULAR | Status: DC | PRN

## 2020-02-10 NOTE — Discharge Instructions (Signed)
10 Things You Can Do to Manage Your COVID-19 Symptoms at Home If you have possible or confirmed COVID-19: 1. Stay home from work and school. And stay away from other public places. If you must go out, avoid using any kind of public transportation, ridesharing, or taxis. 2. Monitor your symptoms carefully. If your symptoms get worse, call your healthcare provider immediately. 3. Get rest and stay hydrated. 4. If you have a medical appointment, call the healthcare provider ahead of time and tell them that you have or may have COVID-19. 5. For medical emergencies, call 911 and notify the dispatch personnel that you have or may have COVID-19. 6. Cover your cough and sneezes with a tissue or use the inside of your elbow. 7. Wash your hands often with soap and water for at least 20 seconds or clean your hands with an alcohol-based hand sanitizer that contains at least 60% alcohol. 8. As much as possible, stay in a specific room and away from other people in your home. Also, you should use a separate bathroom, if available. If you need to be around other people in or outside of the home, wear a mask. 9. Avoid sharing personal items with other people in your household, like dishes, towels, and bedding. 10. Clean all surfaces that are touched often, like counters, tabletops, and doorknobs. Use household cleaning sprays or wipes according to the label instructions. cdc.gov/coronavirus 08/04/2018 This information is not intended to replace advice given to you by your health care provider. Make sure you discuss any questions you have with your health care provider. Document Revised: 01/06/2019 Document Reviewed: 01/06/2019 Elsevier Patient Education  2020 Elsevier Inc.  If you have any questions or concerns after the infusion please call the Advanced Practice Provider on call at [phone]. This number is ONLY intended for your use regarding questions or concerns about the infusion post-treatment  side-effects.  Please do not provide this number to others for use. For return to work notes please contact your primary care provider.   If someone you know is interested in receiving treatment please have them call the COVID hotline at [phone].    

## 2020-02-10 NOTE — Progress Notes (Signed)
  Diagnosis: COVID-19  Physician: Dr. Patrick Wright   Procedure: [facility]fusion [facility] Med: remdesivir infusion - Provided patient with remdesivir fact sheet for patients, parents and caregivers prior to infusion.  Complications: No immediate complications noted.  Discharge: Discharged home   Kristi Martin 02/10/2020   

## 2020-02-11 ENCOUNTER — Ambulatory Visit (HOSPITAL_COMMUNITY)
Admit: 2020-02-11 | Discharge: 2020-02-11 | Disposition: A | Discharge status: Home / Self Care | Payer: Medicaid Other | Attending: Pulmonary Disease | Admitting: Pulmonary Disease

## 2020-02-11 MED ORDER — EPINEPHRINE 0.3 MG/0.3ML IJ SOAJ: 0.3000 mg | Freq: Once | INTRAMUSCULAR | Status: DC | PRN

## 2020-02-11 MED ORDER — FAMOTIDINE IN NACL 20-0.9 MG/50ML-% IV SOLN: 20.0000 mg | Freq: Once | INTRAVENOUS | Status: DC | PRN

## 2020-02-11 MED ORDER — METHYLPREDNISOLONE SODIUM SUCC 125 MG IJ SOLR: 125.0000 mg | Freq: Once | INTRAMUSCULAR | Status: DC | PRN

## 2020-02-11 MED ORDER — SODIUM CHLORIDE 0.9 % IV SOLN: INTRAVENOUS | Status: DC | PRN

## 2020-02-11 MED ORDER — SODIUM CHLORIDE 0.9 % IV SOLN
100.0000 mg | Freq: Once | INTRAVENOUS | Status: AC
  Administered 2020-02-11: 100 mg via INTRAVENOUS

## 2020-02-11 MED ORDER — ALBUTEROL SULFATE HFA 108 (90 BASE) MCG/ACT IN AERS: 2.0000 | INHALATION_SPRAY | Freq: Once | RESPIRATORY_TRACT | Status: DC | PRN

## 2020-02-11 NOTE — Progress Notes (Signed)
Patient reviewed Fact Sheet for Patients, Parents, and Caregivers for Emergency Use Authorization (EUA) of remdesivir for the Treatment of Coronavirus. Patient also reviewed and is agreeable to the estimated cost of treatment. Patient is agreeable to proceed.    

## 2020-02-11 NOTE — Discharge Instructions (Signed)
10 Things You Can Do to Manage Your COVID-19 Symptoms at Home If you have possible or confirmed COVID-19: 1. Stay home from work and school. And stay away from other public places. If you must go out, avoid using any kind of public transportation, ridesharing, or taxis. 2. Monitor your symptoms carefully. If your symptoms get worse, call your healthcare provider immediately. 3. Get rest and stay hydrated. 4. If you have a medical appointment, call the healthcare provider ahead of time and tell them that you have or may have COVID-19. 5. For medical emergencies, call 911 and notify the dispatch personnel that you have or may have COVID-19. 6. Cover your cough and sneezes with a tissue or use the inside of your elbow. 7. Wash your hands often with soap and water for at least 20 seconds or clean your hands with an alcohol-based hand sanitizer that contains at least 60% alcohol. 8. As much as possible, stay in a specific room and away from other people in your home. Also, you should use a separate bathroom, if available. If you need to be around other people in or outside of the home, wear a mask. 9. Avoid sharing personal items with other people in your household, like dishes, towels, and bedding. 10. Clean all surfaces that are touched often, like counters, tabletops, and doorknobs. Use household cleaning sprays or wipes according to the label instructions. cdc.gov/coronavirus 08/04/2018 This information is not intended to replace advice given to you by your health care provider. Make sure you discuss any questions you have with your health care provider. Document Revised: 01/06/2019 Document Reviewed: 01/06/2019 Elsevier Patient Education  2020 Elsevier Inc.  If you have any questions or concerns after the infusion please call the Advanced Practice Provider on call at [phone]. This number is ONLY intended for your use regarding questions or concerns about the infusion post-treatment  side-effects.  Please do not provide this number to others for use. For return to work notes please contact your primary care provider.   If someone you know is interested in receiving treatment please have them call the COVID hotline at [phone].    

## 2020-02-11 NOTE — Progress Notes (Signed)
  Diagnosis: COVID-19  Physician: Dr. Asencion Noble  Procedure: [facility] Infusion Clinic Med: remdesivir infusion - Provided patient with remdesivir fact sheet for patients, parents and caregivers prior to infusion.  Complications: No immediate complications noted.  Discharge: Discharged home   Kristi Martin 02/11/2020

## 2020-02-13 ENCOUNTER — Ambulatory Visit (HOSPITAL_COMMUNITY)
Admit: 2020-02-13 | Discharge: 2020-02-13 | Disposition: A | Discharge status: Home / Self Care | Payer: Medicaid Other | Attending: Pulmonary Disease | Admitting: Pulmonary Disease

## 2020-02-13 LAB — CULTURE, BLOOD (ROUTINE X 2)
Culture: NO GROWTH
Culture: NO GROWTH
Special Requests: ADEQUATE
Special Requests: ADEQUATE

## 2020-02-13 MED ORDER — EPINEPHRINE 0.3 MG/0.3ML IJ SOAJ: 0.3000 mg | Freq: Once | INTRAMUSCULAR | Status: DC | PRN

## 2020-02-13 MED ORDER — ALBUTEROL SULFATE HFA 108 (90 BASE) MCG/ACT IN AERS: 2.0000 | INHALATION_SPRAY | Freq: Once | RESPIRATORY_TRACT | Status: DC | PRN

## 2020-02-13 MED ORDER — SODIUM CHLORIDE 0.9 % IV SOLN
100.0000 mg | Freq: Once | INTRAVENOUS | Status: AC
  Administered 2020-02-13: 100 mg via INTRAVENOUS

## 2020-02-13 MED ORDER — METHYLPREDNISOLONE SODIUM SUCC 125 MG IJ SOLR: 125.0000 mg | Freq: Once | INTRAMUSCULAR | Status: DC | PRN

## 2020-02-13 MED ORDER — SODIUM CHLORIDE 0.9 % IV SOLN: INTRAVENOUS | Status: DC | PRN

## 2020-02-13 MED ORDER — FAMOTIDINE IN NACL 20-0.9 MG/50ML-% IV SOLN: 20.0000 mg | Freq: Once | INTRAVENOUS | Status: DC | PRN

## 2020-02-13 NOTE — Progress Notes (Signed)
  Diagnosis: COVID-19  Physician: Dr. Joya Gaskins  Procedure: [facility] Infusion Clinic Med: remdesivir infusion - Provided patient with remdesivir fact sheet for patients, parents and caregivers prior to infusion.  Complications: No immediate complications noted.  Discharge: Discharged home   Kristi Martin 02/13/2020

## 2020-02-13 NOTE — Discharge Instructions (Signed)
If you have any questions or concerns after the infusion please call the Advanced Practice Provider on call at [phone]. This number is ONLY intended for your use regarding questions or concerns about the infusion post-treatment side-effects.  Please do not provide this number to others for use. For return to work notes please contact your primary care provider.   If someone you know is interested in receiving treatment please have them call the [place] hotline at [phone].    10 Things You Can Do to Manage Your COVID-19 Symptoms at Home If you have possible or confirmed COVID-19: 1. Stay home except to get medical care. 2. Monitor your symptoms carefully. If your symptoms get worse, call your healthcare provider immediately. 3. Get rest and stay hydrated. 4. If you have a medical appointment, call the healthcare provider ahead of time and tell them that you have or may have COVID-19. 5. For medical emergencies, call 911 and notify the dispatch personnel that you have or may have COVID-19. 6. Cover your cough and sneezes with a tissue or use the inside of your elbow. 7. Wash your hands often with soap and water for at least 20 seconds or clean your hands with an alcohol-based hand sanitizer that contains at least 60% alcohol. 8. As much as possible, stay in a specific room and away from other people in your home. Also, you should use a separate bathroom, if available. If you need to be around other people in or outside of the home, wear a mask. 9. Avoid sharing personal items with other people in your household, like dishes, towels, and bedding. 10. Clean all surfaces that are touched often, like counters, tabletops, and doorknobs. Use household cleaning sprays or wipes according to the label instructions. [email or web address].[email or web address] 08/19/2019 This information is not intended to replace advice given to you by your health care provider. Make sure you discuss any questions you have with your health  care provider. Document Revised: 12/05/2019 Document Reviewed: 12/05/2019 Elsevier Patient Education  2021 [employer].

## 2020-03-14 NOTE — Addendum Note (Signed)
Encounter addended by: Paul Dykes, RN on: 03/14/2020 6:27 PM  Actions taken: Charge Capture section accepted

## 2020-03-14 NOTE — Addendum Note (Signed)
Encounter addended by: Paul Dykes, RN on: 03/14/2020 6:11 PM  Actions taken: Charge Capture section accepted

## 2020-03-14 NOTE — Addendum Note (Signed)
Encounter addended by: Paul Dykes, RN on: 03/14/2020 6:23 PM  Actions taken: Charge Capture section accepted

## 2020-04-02 ENCOUNTER — Other Ambulatory Visit: Payer: Self-pay

## 2020-04-02 ENCOUNTER — Encounter: Payer: Self-pay | Admitting: Physician Assistant

## 2020-04-02 ENCOUNTER — Encounter (INDEPENDENT_AMBULATORY_CARE_PROVIDER_SITE_OTHER): Payer: Self-pay | Admitting: *Deleted

## 2020-04-02 ENCOUNTER — Ambulatory Visit: Payer: Self-pay | Admitting: Physician Assistant

## 2020-04-02 VITALS — BP 130/82 | HR 80 | Temp 97.3°F | Wt 265.5 lb

## 2020-04-02 DIAGNOSIS — F39 Unspecified mood [affective] disorder: Secondary | ICD-10-CM

## 2020-04-02 DIAGNOSIS — Z1239 Encounter for other screening for malignant neoplasm of breast: Secondary | ICD-10-CM

## 2020-04-02 DIAGNOSIS — R1110 Vomiting, unspecified: Secondary | ICD-10-CM

## 2020-04-02 DIAGNOSIS — R32 Unspecified urinary incontinence: Secondary | ICD-10-CM

## 2020-04-02 DIAGNOSIS — M25572 Pain in left ankle and joints of left foot: Secondary | ICD-10-CM

## 2020-04-02 DIAGNOSIS — Z1211 Encounter for screening for malignant neoplasm of colon: Secondary | ICD-10-CM

## 2020-04-02 MED ORDER — OXYBUTYNIN CHLORIDE 5 MG PO TABS: 5.0000 mg | ORAL_TABLET | Freq: Two times a day (BID) | ORAL | 0 refills | Status: DC

## 2020-04-02 MED ORDER — ONDANSETRON 4 MG PO TBDP: 4.0000 mg | ORAL_TABLET | Freq: Three times a day (TID) | ORAL | 0 refills | Status: AC | PRN

## 2020-04-02 MED ORDER — PANTOPRAZOLE SODIUM 40 MG PO TBEC: 40.0000 mg | DELAYED_RELEASE_TABLET | Freq: Every day | ORAL | 0 refills | Status: DC

## 2020-04-02 MED ORDER — DICYCLOMINE HCL 20 MG PO TABS: 20.0000 mg | ORAL_TABLET | Freq: Three times a day (TID) | ORAL | 0 refills | Status: DC | PRN

## 2020-04-02 NOTE — Progress Notes (Signed)
BP 130/82   Pulse 80   Temp (!) 97.3 F (36.3 C)   SpO2 100%    Subjective:    Patient ID: Kristi Martin, female    DOB: [birth year], 50 y.o.   MRN: [id number]  HPI: Kristi Martin is a 50 y.o. female presenting on 04/02/2020 for Follow-up   HPI   Pt had a negative covid 19 screening questionnaire.   Pt is 62yoF who presents for routine follow up.  She Had covid last month and says she is not back to 100 %.  She received remdesivir infusion.  She is still living at [facility].  She is still looking for a different place to live.    She says her Mood is still swinging.  She is still going to [facility] for [place] issues.  She Didn't make it to her mammogram appointment but is now ready to get rescheduled for that.    She is eating.  She is trying to get back to walking.    She says she is having emesis daily.  She says it occurs Right after she eats.  She says it is every time after she eats.  She says she was having emesis before covid but now it's more.   It's been over a month that she has been having emesis.  She says she is Not having dysphagia.  She had hysteroscopy and ablation in 2020.    Pt says her Left ankle has been hurting some for the past 3 or so days.  She is unaware of injury.       Relevant past medical, surgical, family and social history reviewed and updated as indicated. Interim medical history since our last visit reviewed. Allergies and medications reviewed and updated.   Current Outpatient Medications:  .  albuterol (VENTOLIN HFA) 108 (90 Base) MCG/ACT inhaler, Inhale 1-2 puffs into the lungs every 6 (six) hours as needed for wheezing or shortness of breath., Disp: 6.7 g, Rfl: 0 .  Ascorbic Acid (VITAMIN C PO), Take by mouth., Disp: , Rfl:  .  Ferrous Sulfate (IRON PO), Take 1 tablet by mouth daily., Disp: , Rfl:  .  Multiple Vitamin (MULTIVITAMIN WITH MINERALS) TABS tablet, Take 1 tablet by mouth daily. WOMEN'S ONE A DAY, Disp: , Rfl:  .  pantoprazole  (PROTONIX) 40 MG tablet, Take 1 tablet (40 mg total) by mouth daily., Disp: 30 tablet, Rfl: 0 .  Probiotic Product (PROBIOTIC PO), Take 1 tablet by mouth daily., Disp: , Rfl:  .  VITAMIN D PO, Take by mouth., Disp: , Rfl:  .  dicyclomine (BENTYL) 20 MG tablet, Take 1 tablet (20 mg total) by mouth 3 (three) times daily as needed for spasms (abdominal cramping). (Patient not taking: Reported on 04/02/2020), Disp: 20 tablet, Rfl: 0 .  ondansetron (ZOFRAN ODT) 4 MG disintegrating tablet, Take 1 tablet (4 mg total) by mouth every 8 (eight) hours as needed. (Patient not taking: No sig reported), Disp: 20 tablet, Rfl: 0 .  oxybutynin (DITROPAN) 5 MG tablet, Take 1 tablet (5 mg total) by mouth 2 (two) times daily. (Patient not taking: No sig reported), Disp: 60 tablet, Rfl: 0    Review of Systems  Per HPI unless specifically indicated above     Objective:    BP 130/82   Pulse 80   Temp (!) 97.3 F (36.3 C)   SpO2 100%   Wt Readings from Last 3 Encounters:  02/07/20 282 lb (127.9 kg)  12/21/19 271  lb 12 oz (123.3 kg)  12/17/19 270 lb (122.5 kg)    Last Weight  Most recent update: 04/02/2020  9:55 AM   Weight  120.4 kg (265 lb 8 oz)               Physical Exam Constitutional:      General: She is not in acute distress.    Appearance: She is obese. She is not ill-appearing.  HENT:     Head: Normocephalic and atraumatic.  Cardiovascular:     Rate and Rhythm: Normal rate and regular rhythm.  Pulmonary:     Effort: Pulmonary effort is normal. No respiratory distress.     Breath sounds: Normal breath sounds. No wheezing or rhonchi.  Abdominal:     General: Bowel sounds are normal.     Palpations: Abdomen is soft. There is no mass.     Tenderness: There is no abdominal tenderness. There is no guarding or rebound.  Musculoskeletal:     Cervical back: Neck supple.     Right lower leg: No edema.     Left lower leg: Normal. No edema.     Left ankle: Tenderness present. Normal range  of motion.     Left foot: Normal range of motion. Normal pulse.     Comments: Soft tissue tenderness left lateral ankle, inferior to the malleolus  Lymphadenopathy:     Cervical: No cervical adenopathy.  Neurological:     Mental Status: She is alert and oriented to person, place, and time.     Motor: No weakness or tremor.     Gait: Gait normal.              Assessment & Plan:    Encounter Diagnoses  Name Primary?  . Non-intractable vomiting, presence of nausea not specified, unspecified vomiting type Yes  . Urinary incontinence, unspecified type   . Mood disorder ([place])   . Encounter for screening for malignant neoplasm of breast, unspecified screening modality   . Screen for colon cancer   . Morbid obesity ([place])   . Acute left ankle pain      -encouraged pt to get covid booster -will reschedule pt's screening mammogram -will Update PAP at next appointment -pt is given ifobt for colon cancer screening -refilled oxybutynin,  Bentyl,   zofran  -pt to continue with [facility] for MH issues.  -Refer to GI for persistent emesis -pt to ice, elevate the ankle.  Likely contusion, possible bursitis -She doesn't know if she has [employer] financial assistance .  She was given another application and told that [facility] can help her if needed  -pt to follow up 2 months.  She is to contact office sooner prn

## 2020-04-10 ENCOUNTER — Other Ambulatory Visit: Payer: Self-pay | Admitting: Physician Assistant

## 2020-04-10 DIAGNOSIS — Z1211 Encounter for screening for malignant neoplasm of colon: Secondary | ICD-10-CM

## 2020-04-10 LAB — IFOBT (OCCULT BLOOD): IFOBT: NEGATIVE

## 2020-04-16 ENCOUNTER — Encounter (HOSPITAL_COMMUNITY): Payer: Self-pay

## 2020-04-16 ENCOUNTER — Ambulatory Visit (HOSPITAL_COMMUNITY): Payer: Medicaid Other

## 2020-04-19 ENCOUNTER — Encounter (INDEPENDENT_AMBULATORY_CARE_PROVIDER_SITE_OTHER): Payer: Self-pay | Admitting: *Deleted

## 2020-04-24 ENCOUNTER — Telehealth: Payer: Self-pay | Admitting: Obstetrics & Gynecology

## 2020-04-24 NOTE — Telephone Encounter (Signed)
PT had an ablation in 2020 and should not have a period,  says for the last 3 days she has had heavy bleeding and lower abdominal pain. No appointments available until after 05/04/20.

## 2020-04-24 NOTE — Telephone Encounter (Signed)
Patient states she has started having heavy vaginal bleeding and has not had any bleeding since she had her ablation in 2020.  Informed patient that the ablation is not 100% in stopping the bleeding and that bleeding can return.  Advised to make an appointment for evaluation. Pt verbalized understanding and appt made.

## 2020-05-03 ENCOUNTER — Ambulatory Visit: Payer: Self-pay | Admitting: Adult Health

## 2020-05-30 ENCOUNTER — Encounter: Payer: Self-pay | Admitting: Physician Assistant

## 2020-05-30 ENCOUNTER — Other Ambulatory Visit: Payer: Self-pay

## 2020-05-30 ENCOUNTER — Ambulatory Visit: Payer: Medicaid Other | Admitting: Physician Assistant

## 2020-05-30 VITALS — BP 146/92 | HR 97 | Temp 97.8°F

## 2020-05-30 DIAGNOSIS — R1084 Generalized abdominal pain: Secondary | ICD-10-CM

## 2020-05-30 NOTE — Progress Notes (Signed)
BP (!) 146/92   Pulse 97   Temp 97.8 F (36.6 C)   SpO2 99%    Subjective:    Patient ID: Kristi Martin, female    DOB: 1970-08-14, 50 y.o.   MRN: [id number]  HPI: Kristi Martin is a 50 y.o. female presenting on 05/30/2020 for No chief complaint on file.   HPI   Pt had a negative covid 19 screening questionnaire.   Pt is 55yoF who presents for routine gyn with PAP.  Pt c/o abd pain today.  She doesn't feel well today.  It started yesterday.  No emesis or diarrhea.  She is having cramping.   She is tolerating PO intake but doesn't have much appetite..  She is not having vaginal bleeding at this time.  Oral intake okay.    She was referred at her February appt here to GI.  She has appt with GI in June.  She had appt with gyn in [place] for abnormal bleeding but she was a no-show to that appointment.  She does not want to do her pap today.    She has not gotten covid booster but is planning to.  She is still having some stress with her living situation.     Relevant past medical, surgical, family and social history reviewed and updated as indicated. Interim medical history since our last visit reviewed. Allergies and medications reviewed and updated.   Current Outpatient Medications:  .  albuterol (VENTOLIN HFA) 108 (90 Base) MCG/ACT inhaler, Inhale 1-2 puffs into the lungs every 6 (six) hours as needed for wheezing or shortness of breath., Disp: 6.7 g, Rfl: 0 .  Ascorbic Acid (VITAMIN C PO), Take by mouth., Disp: , Rfl:  .  dicyclomine (BENTYL) 20 MG tablet, Take 1 tablet (20 mg total) by mouth 3 (three) times daily as needed for spasms (abdominal cramping)., Disp: 20 tablet, Rfl: 0 .  Ferrous Sulfate (IRON PO), Take 1 tablet by mouth daily., Disp: , Rfl:  .  Multiple Vitamin (MULTIVITAMIN WITH MINERALS) TABS tablet, Take 1 tablet by mouth daily. WOMEN'S ONE A DAY, Disp: , Rfl:  .  ondansetron (ZOFRAN ODT) 4 MG disintegrating tablet, Take 1 tablet (4 mg total) by mouth  every 8 (eight) hours as needed., Disp: 20 tablet, Rfl: 0 .  oxybutynin (DITROPAN) 5 MG tablet, Take 1 tablet (5 mg total) by mouth 2 (two) times daily., Disp: 60 tablet, Rfl: 0 .  pantoprazole (PROTONIX) 40 MG tablet, Take 1 tablet (40 mg total) by mouth daily., Disp: 30 tablet, Rfl: 0 .  Probiotic Product (PROBIOTIC PO), Take 1 tablet by mouth daily., Disp: , Rfl:  .  VITAMIN D PO, Take by mouth., Disp: , Rfl:      Review of Systems  Per HPI unless specifically indicated above     Objective:    BP (!) 146/92   Pulse 97   Temp 97.8 F (36.6 C)   SpO2 99%   Wt Readings from Last 3 Encounters:  04/02/20 265 lb 8 oz (120.4 kg)  02/07/20 282 lb (127.9 kg)  12/21/19 271 lb 12 oz (123.3 kg)    Physical Exam Vitals reviewed.  Constitutional:      General: She is not in acute distress.    Appearance: She is well-developed. She is obese. She is not toxic-appearing.  HENT:     Head: Normocephalic and atraumatic.  Cardiovascular:     Rate and Rhythm: Normal rate and regular rhythm.  Pulmonary:  Effort: Pulmonary effort is normal.     Breath sounds: Normal breath sounds.  Abdominal:     General: Bowel sounds are normal.     Palpations: Abdomen is soft. There is no fluid wave, mass or pulsatile mass.     Tenderness: There is generalized abdominal tenderness. There is no guarding or rebound.     Comments: Mild generalized tenderness without rebound or guarding  Musculoskeletal:     Cervical back: Neck supple.     Right lower leg: No edema.     Left lower leg: No edema.  Lymphadenopathy:     Cervical: No cervical adenopathy.  Skin:    General: Skin is warm and dry.  Neurological:     Mental Status: She is alert and oriented to person, place, and time.  Psychiatric:        Behavior: Behavior normal.            Assessment & Plan:     Encounter Diagnosis  Name Primary?  . Generalized abdominal pain Yes      Pt is counseled on bland diet and push fluids  until abdominal pain improves.  She has appointment with GI that she is encouraged to keep.  Discussed reasons why she would need to go to ER.  She is encouraged to reschedule with gyn.  Pt will follow up here in 2 months.  She is encouraged to contact this office for worsening or new symptoms

## 2020-05-31 ENCOUNTER — Ambulatory Visit: Payer: Self-pay | Admitting: Physician Assistant

## 2020-07-03 ENCOUNTER — Ambulatory Visit (INDEPENDENT_AMBULATORY_CARE_PROVIDER_SITE_OTHER): Payer: Medicaid Other | Admitting: Gastroenterology

## 2020-07-16 ENCOUNTER — Other Ambulatory Visit: Payer: Self-pay | Admitting: Physician Assistant

## 2020-07-30 ENCOUNTER — Encounter: Payer: Self-pay | Admitting: Physician Assistant

## 2020-07-30 ENCOUNTER — Ambulatory Visit: Payer: Medicaid Other | Admitting: Physician Assistant

## 2020-07-30 ENCOUNTER — Other Ambulatory Visit: Payer: Self-pay

## 2020-07-30 VITALS — BP 122/86 | HR 88 | Temp 96.8°F

## 2020-07-30 DIAGNOSIS — R1084 Generalized abdominal pain: Secondary | ICD-10-CM

## 2020-07-30 DIAGNOSIS — Z91199 Patient's noncompliance with other medical treatment and regimen due to unspecified reason: Secondary | ICD-10-CM

## 2020-07-30 DIAGNOSIS — R7989 Other specified abnormal findings of blood chemistry: Secondary | ICD-10-CM

## 2020-07-30 DIAGNOSIS — N189 Chronic kidney disease, unspecified: Secondary | ICD-10-CM

## 2020-07-30 DIAGNOSIS — Z1239 Encounter for other screening for malignant neoplasm of breast: Secondary | ICD-10-CM

## 2020-07-30 DIAGNOSIS — E669 Obesity, unspecified: Secondary | ICD-10-CM

## 2020-07-30 DIAGNOSIS — R7303 Prediabetes: Secondary | ICD-10-CM

## 2020-07-30 DIAGNOSIS — F39 Unspecified mood [affective] disorder: Secondary | ICD-10-CM

## 2020-07-30 NOTE — Progress Notes (Signed)
BP 122/86   Pulse 88   Temp (!) 96.8 F (36 C)   SpO2 96%    Subjective:    Patient ID: Kristi Martin, female    DOB: 03/31/1970, 50 y.o.   MRN: [id number]  HPI: Kristi Martin is a 50 y.o. female presenting on 07/30/2020 for No chief complaint on file.   HPI     Pt had a negative covid 19 screening questionnaire.  Pt is 63yoF who presents for routine follow up.  She is going to [facility].  She didn't bring her meds with her.  She says her stomach is improved.  She has appt with GI in October.    She cancelled several appts with Gi due to she was trying to get her financial assistance in place.   She says she has been overwhelmed with funerals recently.   Pt was a no-show to gyn in march when she had follow up scheduled for vaginal bleeding.  She was a no-show to mammogram appt in March.      Relevant past medical, surgical, family and social history reviewed and updated as indicated. Interim medical history since our last visit reviewed. Allergies and medications reviewed and updated.   Current Outpatient Medications:    albuterol (VENTOLIN HFA) 108 (90 Base) MCG/ACT inhaler, Inhale 1-2 puffs into the lungs every 6 (six) hours as needed for wheezing or shortness of breath., Disp: 6.7 g, Rfl: 0   AMITRIPTYLINE HCL PO, Take by mouth., Disp: , Rfl:    Ascorbic Acid (VITAMIN C PO), Take by mouth., Disp: , Rfl:    Calcium Carbonate Antacid (TUMS E-X PO), Take by mouth., Disp: , Rfl:    Citalopram Hydrobromide (CELEXA PO), Take by mouth., Disp: , Rfl:    Multiple Vitamin (MULTIVITAMIN WITH MINERALS) TABS tablet, Take 1 tablet by mouth daily. WOMEN'S ONE A DAY, Disp: , Rfl:    VITAMIN D PO, Take by mouth., Disp: , Rfl:    VITAMIN E PO, Take by mouth., Disp: , Rfl:    dicyclomine (BENTYL) 20 MG tablet, Take 1 tablet (20 mg total) by mouth 3 (three) times daily as needed for spasms (abdominal cramping)., Disp: 20 tablet, Rfl: 0   Ferrous Sulfate (IRON PO), Take  1 tablet by mouth daily., Disp: , Rfl:    ondansetron (ZOFRAN ODT) 4 MG disintegrating tablet, Take 1 tablet (4 mg total) by mouth every 8 (eight) hours as needed., Disp: 20 tablet, Rfl: 0   oxybutynin (DITROPAN) 5 MG tablet, Take 1 tablet (5 mg total) by mouth 2 (two) times daily., Disp: 60 tablet, Rfl: 0   pantoprazole (PROTONIX) 40 MG tablet, Take 1 tablet (40 mg total) by mouth daily., Disp: 30 tablet, Rfl: 0   Probiotic Product (PROBIOTIC PO), Take 1 tablet by mouth daily., Disp: , Rfl:     Review of Systems  Per HPI unless specifically indicated above     Objective:    BP 122/86   Pulse 88   Temp (!) 96.8 F (36 C)   SpO2 96%   Wt Readings from Last 3 Encounters:  04/02/20 265 lb 8 oz (120.4 kg)  02/07/20 282 lb (127.9 kg)  12/21/19 271 lb 12 oz (123.3 kg)    Physical Exam Vitals reviewed.  Constitutional:      Appearance: She is well-developed.  HENT:     Head: Normocephalic and atraumatic.  Cardiovascular:     Rate and Rhythm: Normal rate and regular rhythm.  Pulmonary:  Effort: Pulmonary effort is normal.     Breath sounds: Normal breath sounds.  Abdominal:     General: Bowel sounds are normal.     Palpations: Abdomen is soft. There is no mass.     Tenderness: There is no abdominal tenderness.  Musculoskeletal:     Cervical back: Neck supple.  Lymphadenopathy:     Cervical: No cervical adenopathy.  Skin:    General: Skin is warm and dry.  Neurological:     Mental Status: She is alert and oriented to person, place, and time.  Psychiatric:        Behavior: Behavior normal.            Assessment & Plan:    Encounter Diagnoses  Name Primary?   Generalized abdominal pain Yes   Prediabetes    Elevated LFTs    Chronic kidney disease, unspecified CKD stage    Encounter for screening for malignant neoplasm of breast, unspecified screening modality    Mood disorder ([place])    Obesity, unspecified classification, unspecified obesity type,  unspecified whether serious comorbidity present    Personal history of noncompliance with medical treatment, presenting hazards to health      She is encouraged to call gyn to update PAP (she has [place] medicaid)  Encouraged covid booster.  Will re-refer for screening mammogram  Pt to see GI as scheduled  Will Update labs.  Pt will be called  with results  Pt to follow up 3 months.  She is to contact office sooner prn

## 2020-08-02 ENCOUNTER — Ambulatory Visit (INDEPENDENT_AMBULATORY_CARE_PROVIDER_SITE_OTHER): Payer: Medicaid Other | Admitting: Gastroenterology

## 2020-08-22 ENCOUNTER — Other Ambulatory Visit: Payer: Self-pay | Admitting: Physician Assistant

## 2020-08-22 ENCOUNTER — Other Ambulatory Visit (HOSPITAL_COMMUNITY): Payer: Self-pay | Admitting: Physician Assistant

## 2020-08-22 DIAGNOSIS — Z1239 Encounter for other screening for malignant neoplasm of breast: Secondary | ICD-10-CM

## 2020-08-22 DIAGNOSIS — Z1231 Encounter for screening mammogram for malignant neoplasm of breast: Secondary | ICD-10-CM

## 2020-09-04 ENCOUNTER — Other Ambulatory Visit: Payer: Self-pay | Admitting: Physician Assistant

## 2020-09-04 ENCOUNTER — Telehealth: Payer: Self-pay

## 2020-09-04 MED ORDER — OXYBUTYNIN CHLORIDE 5 MG PO TABS: 5.0000 mg | ORAL_TABLET | Freq: Two times a day (BID) | ORAL | 2 refills | Status: DC

## 2020-09-04 NOTE — Telephone Encounter (Signed)
Pt called for Oxybutinin refill, info given to provider to refill at [facility] in [place].

## 2020-09-07 ENCOUNTER — Ambulatory Visit (HOSPITAL_COMMUNITY): Payer: Medicaid Other

## 2020-09-07 ENCOUNTER — Encounter (HOSPITAL_COMMUNITY): Payer: Self-pay

## 2020-10-01 ENCOUNTER — Other Ambulatory Visit: Payer: Self-pay | Admitting: Physician Assistant

## 2020-10-01 MED ORDER — OXYBUTYNIN CHLORIDE 5 MG PO TABS: 5.0000 mg | ORAL_TABLET | Freq: Two times a day (BID) | ORAL | 2 refills | Status: DC

## 2020-10-03 ENCOUNTER — Ambulatory Visit: Payer: Medicaid Other | Admitting: Adult Health

## 2020-10-30 ENCOUNTER — Ambulatory Visit: Payer: Medicaid Other | Admitting: Physician Assistant

## 2020-10-30 ENCOUNTER — Encounter: Payer: Self-pay | Admitting: Physician Assistant

## 2020-10-30 ENCOUNTER — Other Ambulatory Visit: Payer: Self-pay

## 2020-10-30 DIAGNOSIS — F39 Unspecified mood [affective] disorder: Secondary | ICD-10-CM

## 2020-10-30 DIAGNOSIS — R7303 Prediabetes: Secondary | ICD-10-CM

## 2020-10-30 DIAGNOSIS — N939 Abnormal uterine and vaginal bleeding, unspecified: Secondary | ICD-10-CM

## 2020-10-30 DIAGNOSIS — Z1231 Encounter for screening mammogram for malignant neoplasm of breast: Secondary | ICD-10-CM

## 2020-10-30 DIAGNOSIS — N189 Chronic kidney disease, unspecified: Secondary | ICD-10-CM

## 2020-10-30 NOTE — Progress Notes (Deleted)
mo
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
## 2020-11-12 ENCOUNTER — Ambulatory Visit (HOSPITAL_COMMUNITY): Payer: Medicaid Other

## 2020-12-03 ENCOUNTER — Encounter (INDEPENDENT_AMBULATORY_CARE_PROVIDER_SITE_OTHER): Payer: Self-pay | Admitting: Gastroenterology

## 2020-12-03 ENCOUNTER — Ambulatory Visit (INDEPENDENT_AMBULATORY_CARE_PROVIDER_SITE_OTHER): Payer: Medicaid Other | Admitting: Gastroenterology

## 2020-12-10 ENCOUNTER — Other Ambulatory Visit: Payer: Self-pay

## 2020-12-10 ENCOUNTER — Ambulatory Visit (HOSPITAL_COMMUNITY)
Admission: RE | Admit: 2020-12-10 | Discharge: 2020-12-10 | Disposition: A | Discharge status: Home / Self Care | Payer: Self-pay | Source: Ambulatory Visit | Attending: Physician Assistant | Admitting: Physician Assistant

## 2020-12-10 DIAGNOSIS — Z1231 Encounter for screening mammogram for malignant neoplasm of breast: Secondary | ICD-10-CM | POA: Insufficient documentation

## 2020-12-11 ENCOUNTER — Other Ambulatory Visit (HOSPITAL_COMMUNITY): Payer: Self-pay | Admitting: Physician Assistant

## 2020-12-11 DIAGNOSIS — R928 Other abnormal and inconclusive findings on diagnostic imaging of breast: Secondary | ICD-10-CM

## 2020-12-18 ENCOUNTER — Ambulatory Visit (HOSPITAL_COMMUNITY): Payer: Self-pay

## 2020-12-18 ENCOUNTER — Inpatient Hospital Stay (HOSPITAL_COMMUNITY): Admission: RE | Admit: 2020-12-18 | Payer: Medicaid Other | Source: Ambulatory Visit

## 2020-12-20 ENCOUNTER — Other Ambulatory Visit: Payer: Self-pay

## 2020-12-20 ENCOUNTER — Ambulatory Visit (HOSPITAL_COMMUNITY)
Admission: RE | Admit: 2020-12-20 | Discharge: 2020-12-20 | Disposition: A | Discharge status: Home / Self Care | Payer: Self-pay | Source: Ambulatory Visit | Attending: Physician Assistant | Admitting: Physician Assistant

## 2020-12-20 DIAGNOSIS — R928 Other abnormal and inconclusive findings on diagnostic imaging of breast: Secondary | ICD-10-CM | POA: Insufficient documentation

## 2020-12-31 ENCOUNTER — Ambulatory Visit: Payer: Medicaid Other | Admitting: Physician Assistant

## 2021-02-28 ENCOUNTER — Other Ambulatory Visit: Payer: Self-pay | Admitting: Physician Assistant

## 2021-02-28 DIAGNOSIS — R7989 Other specified abnormal findings of blood chemistry: Secondary | ICD-10-CM

## 2021-02-28 DIAGNOSIS — R7303 Prediabetes: Secondary | ICD-10-CM

## 2021-02-28 DIAGNOSIS — D649 Anemia, unspecified: Secondary | ICD-10-CM

## 2021-02-28 DIAGNOSIS — N189 Chronic kidney disease, unspecified: Secondary | ICD-10-CM

## 2021-03-11 ENCOUNTER — Ambulatory Visit: Payer: Medicaid Other | Admitting: Physician Assistant

## 2021-03-11 ENCOUNTER — Other Ambulatory Visit: Payer: Self-pay

## 2021-03-11 ENCOUNTER — Encounter: Payer: Self-pay | Admitting: Physician Assistant

## 2021-03-11 VITALS — BP 107/87 | HR 96 | Temp 97.0°F | Wt 257.0 lb

## 2021-03-11 DIAGNOSIS — R202 Paresthesia of skin: Secondary | ICD-10-CM

## 2021-03-11 DIAGNOSIS — M25512 Pain in left shoulder: Secondary | ICD-10-CM

## 2021-03-11 MED ORDER — PREDNISONE 10 MG PO TABS: ORAL_TABLET | ORAL | 0 refills | Status: DC
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
## 2021-03-25 ENCOUNTER — Emergency Department (HOSPITAL_COMMUNITY)
Admission: EM | Admit: 2021-03-25 | Discharge: 2021-03-26 | Disposition: A | Discharge status: Home / Self Care | Payer: Self-pay | Attending: Emergency Medicine | Admitting: Emergency Medicine

## 2021-03-25 ENCOUNTER — Encounter (HOSPITAL_COMMUNITY): Payer: Self-pay | Admitting: Emergency Medicine

## 2021-03-25 ENCOUNTER — Emergency Department (HOSPITAL_COMMUNITY): Payer: Self-pay

## 2021-03-25 DIAGNOSIS — F1190 Opioid use, unspecified, uncomplicated: Secondary | ICD-10-CM | POA: Insufficient documentation

## 2021-03-25 DIAGNOSIS — Z7951 Long term (current) use of inhaled steroids: Secondary | ICD-10-CM | POA: Insufficient documentation

## 2021-03-25 DIAGNOSIS — Z8541 Personal history of malignant neoplasm of cervix uteri: Secondary | ICD-10-CM | POA: Insufficient documentation

## 2021-03-25 DIAGNOSIS — J45909 Unspecified asthma, uncomplicated: Secondary | ICD-10-CM | POA: Insufficient documentation

## 2021-03-25 DIAGNOSIS — Z79899 Other long term (current) drug therapy: Secondary | ICD-10-CM | POA: Insufficient documentation

## 2021-03-25 DIAGNOSIS — M25512 Pain in left shoulder: Secondary | ICD-10-CM | POA: Insufficient documentation

## 2021-03-25 DIAGNOSIS — R7401 Elevation of levels of liver transaminase levels: Secondary | ICD-10-CM | POA: Insufficient documentation

## 2021-03-25 DIAGNOSIS — F1910 Other psychoactive substance abuse, uncomplicated: Secondary | ICD-10-CM

## 2021-03-25 DIAGNOSIS — M25511 Pain in right shoulder: Secondary | ICD-10-CM | POA: Insufficient documentation

## 2021-03-25 DIAGNOSIS — R7989 Other specified abnormal findings of blood chemistry: Secondary | ICD-10-CM

## 2021-03-25 DIAGNOSIS — N183 Chronic kidney disease, stage 3 unspecified: Secondary | ICD-10-CM | POA: Insufficient documentation

## 2021-03-25 DIAGNOSIS — R000 Tachycardia, unspecified: Secondary | ICD-10-CM | POA: Insufficient documentation

## 2021-03-25 DIAGNOSIS — R400 Somnolence: Secondary | ICD-10-CM | POA: Insufficient documentation

## 2021-03-25 DIAGNOSIS — Z20822 Contact with and (suspected) exposure to covid-19: Secondary | ICD-10-CM | POA: Insufficient documentation

## 2021-03-25 DIAGNOSIS — R1010 Upper abdominal pain, unspecified: Secondary | ICD-10-CM | POA: Insufficient documentation

## 2021-03-25 DIAGNOSIS — F1490 Cocaine use, unspecified, uncomplicated: Secondary | ICD-10-CM | POA: Insufficient documentation

## 2021-03-25 MED ORDER — ACETAMINOPHEN 500 MG PO TABS
1000.0000 mg | ORAL_TABLET | Freq: Once | ORAL | Status: AC
  Administered 2021-03-25: 1000 mg via ORAL
  Filled 2021-03-25: qty 2

## 2021-03-25 MED ORDER — SODIUM CHLORIDE 0.9 % IV BOLUS
1000.0000 mL | Freq: Once | INTRAVENOUS | Status: AC
  Administered 2021-03-26: 1000 mL via INTRAVENOUS

## 2021-03-25 NOTE — ED Triage Notes (Signed)
Pt c/o chronic left shoulder pain, pt seen by PCP on 03/11/21 for same states they gave her prednisone but the pain hasn't improved.

## 2021-03-25 NOTE — ED Notes (Signed)
Pt currently asleep in waiting room, NAD. Will continue to monitor.
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
## 2021-03-26 ENCOUNTER — Emergency Department (HOSPITAL_COMMUNITY): Payer: Self-pay

## 2021-03-26 LAB — BLOOD GAS, ARTERIAL
Acid-Base Excess: 2.5 mmol/L — ABNORMAL HIGH (ref 0.0–2.0)
Bicarbonate: 26.3 mmol/L (ref 20.0–28.0)
Drawn by: 38235
FIO2: 21 %
O2 Saturation: 96.9 %
Patient temperature: 37
pCO2 arterial: 37 mmHg (ref 32–48)
pH, Arterial: 7.46 — ABNORMAL HIGH (ref 7.35–7.45)
pO2, Arterial: 75 mmHg — ABNORMAL LOW (ref 83–108)

## 2021-03-26 LAB — CBC WITH DIFFERENTIAL/PLATELET
Abs Immature Granulocytes: 0.05 10*3/uL (ref 0.00–0.07)
Basophils Absolute: 0.1 10*3/uL (ref 0.0–0.1)
Basophils Relative: 1 %
Eosinophils Absolute: 0 10*3/uL (ref 0.0–0.5)
Eosinophils Relative: 0 %
HCT: 43.7 % (ref 36.0–46.0)
Hemoglobin: 13.9 g/dL (ref 12.0–15.0)
Immature Granulocytes: 1 %
Lymphocytes Relative: 13 %
Lymphs Abs: 1.3 10*3/uL (ref 0.7–4.0)
MCH: 28.9 pg (ref 26.0–34.0)
MCHC: 31.8 g/dL (ref 30.0–36.0)
MCV: 90.9 fL (ref 80.0–100.0)
Monocytes Absolute: 1.2 10*3/uL — ABNORMAL HIGH (ref 0.1–1.0)
Monocytes Relative: 11 %
Neutro Abs: 7.7 10*3/uL (ref 1.7–7.7)
Neutrophils Relative %: 74 %
Platelets: 289 10*3/uL (ref 150–400)
RBC: 4.81 MIL/uL (ref 3.87–5.11)
RDW: 13.9 % (ref 11.5–15.5)
WBC: 10.3 10*3/uL (ref 4.0–10.5)
nRBC: 0 % (ref 0.0–0.2)

## 2021-03-26 LAB — URINALYSIS, ROUTINE W REFLEX MICROSCOPIC
Bacteria, UA: NONE SEEN
Bilirubin Urine: NEGATIVE
Glucose, UA: NEGATIVE mg/dL
Ketones, ur: NEGATIVE mg/dL
Leukocytes,Ua: NEGATIVE
Nitrite: NEGATIVE
Protein, ur: NEGATIVE mg/dL
Specific Gravity, Urine: 1.018 (ref 1.005–1.030)
pH: 6 (ref 5.0–8.0)

## 2021-03-26 LAB — RAPID URINE DRUG SCREEN, HOSP PERFORMED
Amphetamines: NOT DETECTED
Barbiturates: NOT DETECTED
Benzodiazepines: NOT DETECTED
Cocaine: POSITIVE — AB
Opiates: POSITIVE — AB
Tetrahydrocannabinol: NOT DETECTED

## 2021-03-26 LAB — GRAM STAIN: Gram Stain: NONE SEEN

## 2021-03-26 LAB — CSF CELL COUNT WITH DIFFERENTIAL
RBC Count, CSF: 1 /mm3 — ABNORMAL HIGH
RBC Count, CSF: 43 /mm3 — ABNORMAL HIGH
Tube #: 1
Tube #: 4
WBC, CSF: 1 /mm3 (ref 0–5)
WBC, CSF: 2 /mm3 (ref 0–5)

## 2021-03-26 LAB — COMPREHENSIVE METABOLIC PANEL
ALT: 324 U/L — ABNORMAL HIGH (ref 0–44)
AST: 324 U/L — ABNORMAL HIGH (ref 15–41)
Albumin: 3.5 g/dL (ref 3.5–5.0)
Alkaline Phosphatase: 269 U/L — ABNORMAL HIGH (ref 38–126)
Anion gap: 9 (ref 5–15)
BUN: 15 mg/dL (ref 6–20)
CO2: 27 mmol/L (ref 22–32)
Calcium: 8.8 mg/dL — ABNORMAL LOW (ref 8.9–10.3)
Chloride: 103 mmol/L (ref 98–111)
Creatinine, Ser: 1.16 mg/dL — ABNORMAL HIGH (ref 0.44–1.00)
GFR, Estimated: 57 mL/min — ABNORMAL LOW (ref >60)
Glucose, Bld: 113 mg/dL — ABNORMAL HIGH (ref 70–99)
Potassium: 3.8 mmol/L (ref 3.5–5.1)
Sodium: 139 mmol/L (ref 135–145)
Total Bilirubin: 1 mg/dL (ref 0.3–1.2)
Total Protein: 7.5 g/dL (ref 6.5–8.1)

## 2021-03-26 LAB — HEPATITIS PANEL, ACUTE
HCV Ab: NONREACTIVE
Hep A IgM: NONREACTIVE
Hep B C IgM: NONREACTIVE
Hepatitis B Surface Ag: NONREACTIVE

## 2021-03-26 LAB — AMMONIA: Ammonia: 14 umol/L (ref 9–35)

## 2021-03-26 LAB — RESP PANEL BY RT-PCR (FLU A&B, COVID) ARPGX2
Influenza A by PCR: NEGATIVE
Influenza B by PCR: NEGATIVE
SARS Coronavirus 2 by RT PCR: NEGATIVE

## 2021-03-26 LAB — LIPASE, BLOOD: Lipase: 41 U/L (ref 11–51)

## 2021-03-26 LAB — ETHANOL: Alcohol, Ethyl (B): <10 mg/dL (ref <10)

## 2021-03-26 LAB — LACTIC ACID, PLASMA: Lactic Acid, Venous: 1 mmol/L (ref 0.5–1.9)

## 2021-03-26 LAB — GLUCOSE, CSF: Glucose, CSF: 73 mg/dL — ABNORMAL HIGH (ref 40–70)

## 2021-03-26 LAB — PROTEIN, CSF: Total  Protein, CSF: 26 mg/dL (ref 15–45)

## 2021-03-26 LAB — ACETAMINOPHEN LEVEL: Acetaminophen (Tylenol), Serum: 11 ug/mL (ref 10–30)

## 2021-03-26 MED ORDER — NALOXONE HCL 0.4 MG/ML IJ SOLN
0.4000 mg | Freq: Once | INTRAMUSCULAR | Status: AC
Start: 2021-03-26 — End: 2021-03-26
  Administered 2021-03-26: 0.4 mg via INTRAVENOUS
  Filled 2021-03-26: qty 1

## 2021-03-26 MED ORDER — IOHEXOL 300 MG/ML  SOLN
100.0000 mL | Freq: Once | INTRAMUSCULAR | Status: AC | PRN
  Administered 2021-03-26: 100 mL via INTRAVENOUS
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
## 2021-03-29 LAB — CSF CULTURE W GRAM STAIN
Culture: NO GROWTH
Gram Stain: NONE SEEN

## 2021-04-01 LAB — CULTURE, BLOOD (ROUTINE X 2)
Culture: NO GROWTH
Culture: NO GROWTH
Special Requests: ADEQUATE
Special Requests: ADEQUATE

## 2022-04-24 ENCOUNTER — Ambulatory Visit (HOSPITAL_COMMUNITY)
Admission: RE | Admit: 2022-04-24 | Discharge: 2022-04-24 | Disposition: A | Discharge status: Home / Self Care | Payer: Medicaid Other | Source: Ambulatory Visit | Attending: Internal Medicine | Admitting: Internal Medicine

## 2022-04-24 ENCOUNTER — Other Ambulatory Visit (HOSPITAL_COMMUNITY)
Admission: RE | Admit: 2022-04-24 | Discharge: 2022-04-24 | Disposition: A | Discharge status: Home / Self Care | Payer: Medicaid Other | Source: Ambulatory Visit | Attending: Internal Medicine | Admitting: Internal Medicine

## 2022-04-24 ENCOUNTER — Other Ambulatory Visit (HOSPITAL_COMMUNITY): Payer: Self-pay | Admitting: Internal Medicine

## 2022-04-24 DIAGNOSIS — Z79899 Other long term (current) drug therapy: Secondary | ICD-10-CM | POA: Insufficient documentation

## 2022-04-24 DIAGNOSIS — N183 Chronic kidney disease, stage 3 unspecified: Secondary | ICD-10-CM | POA: Insufficient documentation

## 2022-04-24 DIAGNOSIS — E559 Vitamin D deficiency, unspecified: Secondary | ICD-10-CM | POA: Insufficient documentation

## 2022-04-24 DIAGNOSIS — R739 Hyperglycemia, unspecified: Secondary | ICD-10-CM | POA: Insufficient documentation

## 2022-04-24 DIAGNOSIS — Z0001 Encounter for general adult medical examination with abnormal findings: Secondary | ICD-10-CM | POA: Insufficient documentation

## 2022-04-24 DIAGNOSIS — M25512 Pain in left shoulder: Secondary | ICD-10-CM | POA: Diagnosis not present

## 2022-04-24 DIAGNOSIS — M19012 Primary osteoarthritis, left shoulder: Secondary | ICD-10-CM | POA: Diagnosis not present

## 2022-04-24 DIAGNOSIS — E039 Hypothyroidism, unspecified: Secondary | ICD-10-CM | POA: Insufficient documentation

## 2022-04-24 LAB — BASIC METABOLIC PANEL
Anion gap: 8 (ref 5–15)
BUN: 14 mg/dL (ref 6–20)
CO2: 26 mmol/L (ref 22–32)
Calcium: 8.3 mg/dL — ABNORMAL LOW (ref 8.9–10.3)
Chloride: 103 mmol/L (ref 98–111)
Creatinine, Ser: 0.9 mg/dL (ref 0.44–1.00)
GFR, Estimated: >60 mL/min (ref >60)
Glucose, Bld: 102 mg/dL — ABNORMAL HIGH (ref 70–99)
Potassium: 3.3 mmol/L — ABNORMAL LOW (ref 3.5–5.1)
Sodium: 137 mmol/L (ref 135–145)

## 2022-04-24 LAB — HEPATIC FUNCTION PANEL
ALT: 14 U/L (ref 0–44)
AST: 15 U/L (ref 15–41)
Albumin: 3.6 g/dL (ref 3.5–5.0)
Alkaline Phosphatase: 67 U/L (ref 38–126)
Bilirubin, Direct: 0.1 mg/dL (ref 0.0–0.2)
Indirect Bilirubin: 0.5 mg/dL (ref 0.3–0.9)
Total Bilirubin: 0.6 mg/dL (ref 0.3–1.2)
Total Protein: 7.2 g/dL (ref 6.5–8.1)

## 2022-04-24 LAB — CBC WITH DIFFERENTIAL/PLATELET
Abs Immature Granulocytes: 0.02 10*3/uL (ref 0.00–0.07)
Basophils Absolute: 0 10*3/uL (ref 0.0–0.1)
Basophils Relative: 1 %
Eosinophils Absolute: 0.1 10*3/uL (ref 0.0–0.5)
Eosinophils Relative: 2 %
HCT: 44.6 % (ref 36.0–46.0)
Hemoglobin: 14.6 g/dL (ref 12.0–15.0)
Immature Granulocytes: 0 %
Lymphocytes Relative: 28 %
Lymphs Abs: 1.3 10*3/uL (ref 0.7–4.0)
MCH: 28.7 pg (ref 26.0–34.0)
MCHC: 32.7 g/dL (ref 30.0–36.0)
MCV: 87.8 fL (ref 80.0–100.0)
Monocytes Absolute: 0.4 10*3/uL (ref 0.1–1.0)
Monocytes Relative: 9 %
Neutro Abs: 2.9 10*3/uL (ref 1.7–7.7)
Neutrophils Relative %: 60 %
Platelets: 220 10*3/uL (ref 150–400)
RBC: 5.08 MIL/uL (ref 3.87–5.11)
RDW: 13.2 % (ref 11.5–15.5)
WBC: 4.8 10*3/uL (ref 4.0–10.5)
nRBC: 0 % (ref 0.0–0.2)

## 2022-04-24 LAB — LIPID PANEL
Cholesterol: 172 mg/dL (ref 0–200)
HDL: 46 mg/dL (ref >40)
LDL Cholesterol: 103 mg/dL — ABNORMAL HIGH (ref 0–99)
Total CHOL/HDL Ratio: 3.7 RATIO
Triglycerides: 114 mg/dL (ref <150)
VLDL: 23 mg/dL (ref 0–40)

## 2022-04-24 LAB — TSH: TSH: 5.272 u[IU]/mL — ABNORMAL HIGH (ref 0.350–4.500)

## 2022-04-24 LAB — HEMOGLOBIN A1C
Hgb A1c MFr Bld: 6.1 % — ABNORMAL HIGH (ref 4.8–5.6)
Mean Plasma Glucose: 128 mg/dL

## 2022-04-24 LAB — VITAMIN D 25 HYDROXY (VIT D DEFICIENCY, FRACTURES): Vit D, 25-Hydroxy: 29.91 ng/mL — ABNORMAL LOW (ref 30–100)

## 2022-04-24 LAB — T4, FREE: Free T4: 0.96 ng/dL (ref 0.61–1.12)

## 2022-07-22 DIAGNOSIS — F4310 Post-traumatic stress disorder, unspecified: Secondary | ICD-10-CM | POA: Diagnosis not present

## 2022-07-22 DIAGNOSIS — F331 Major depressive disorder, recurrent, moderate: Secondary | ICD-10-CM | POA: Diagnosis not present

## 2022-07-22 DIAGNOSIS — M545 Low back pain, unspecified: Secondary | ICD-10-CM | POA: Diagnosis not present

## 2022-07-22 DIAGNOSIS — E039 Hypothyroidism, unspecified: Secondary | ICD-10-CM | POA: Diagnosis not present

## 2022-08-29 ENCOUNTER — Other Ambulatory Visit (HOSPITAL_COMMUNITY): Payer: Self-pay | Admitting: Internal Medicine

## 2022-08-29 ENCOUNTER — Other Ambulatory Visit (HOSPITAL_COMMUNITY)
Admission: RE | Admit: 2022-08-29 | Discharge: 2022-08-29 | Disposition: A | Discharge status: Home / Self Care | Payer: 59 | Source: Ambulatory Visit | Attending: Internal Medicine | Admitting: Internal Medicine

## 2022-08-29 ENCOUNTER — Ambulatory Visit (HOSPITAL_COMMUNITY)
Admission: RE | Admit: 2022-08-29 | Discharge: 2022-08-29 | Disposition: A | Discharge status: Home / Self Care | Payer: 59 | Source: Ambulatory Visit | Attending: Internal Medicine | Admitting: Internal Medicine

## 2022-08-29 DIAGNOSIS — M545 Low back pain, unspecified: Secondary | ICD-10-CM

## 2022-08-29 DIAGNOSIS — M47816 Spondylosis without myelopathy or radiculopathy, lumbar region: Secondary | ICD-10-CM | POA: Diagnosis not present

## 2022-08-29 DIAGNOSIS — Z0001 Encounter for general adult medical examination with abnormal findings: Secondary | ICD-10-CM | POA: Insufficient documentation

## 2022-08-29 DIAGNOSIS — E039 Hypothyroidism, unspecified: Secondary | ICD-10-CM | POA: Insufficient documentation

## 2022-08-29 LAB — BASIC METABOLIC PANEL
Anion gap: 8 (ref 5–15)
BUN: 19 mg/dL (ref 6–20)
CO2: 25 mmol/L (ref 22–32)
Calcium: 8.4 mg/dL — ABNORMAL LOW (ref 8.9–10.3)
Chloride: 105 mmol/L (ref 98–111)
Creatinine, Ser: 1.05 mg/dL — ABNORMAL HIGH (ref 0.44–1.00)
GFR, Estimated: >60 mL/min (ref >60)
Glucose, Bld: 125 mg/dL — ABNORMAL HIGH (ref 70–99)
Potassium: 3.7 mmol/L (ref 3.5–5.1)
Sodium: 138 mmol/L (ref 135–145)

## 2022-08-29 LAB — TSH: TSH: 4.211 u[IU]/mL (ref 0.350–4.500)

## 2022-08-29 LAB — T4, FREE: Free T4: 0.88 ng/dL (ref 0.61–1.12)

## 2022-09-03 ENCOUNTER — Ambulatory Visit: Payer: 59 | Admitting: Orthopaedic Surgery

## 2022-09-09 ENCOUNTER — Encounter: Payer: Self-pay | Admitting: Orthopaedic Surgery

## 2022-09-09 ENCOUNTER — Ambulatory Visit: Payer: 59 | Admitting: Orthopaedic Surgery

## 2022-09-09 VITALS — BP 145/97 | HR 91 | Ht 66.0 in | Wt 266.4 lb

## 2022-09-09 DIAGNOSIS — M5442 Lumbago with sciatica, left side: Secondary | ICD-10-CM | POA: Diagnosis not present

## 2022-09-09 DIAGNOSIS — M5441 Lumbago with sciatica, right side: Secondary | ICD-10-CM | POA: Diagnosis not present

## 2022-09-09 DIAGNOSIS — G8929 Other chronic pain: Secondary | ICD-10-CM

## 2022-09-09 MED ORDER — HYDROCODONE-ACETAMINOPHEN 5-325 MG PO TABS: 1.0000 | ORAL_TABLET | ORAL | 0 refills | Status: AC | PRN

## 2022-09-09 NOTE — Progress Notes (Signed)
Subjective:    Patient ID: Kristi Martin, female    DOB: February 17, 1970, 52 y.o.   MRN: [id number]  HPI She has had lower back pain for several months, getting worse over the last several weeks.  She saw Dr. Felecia Shelling and had X-rays of the lumbar spine on 08-29-22 which were negative. She was placed on prednisone dose pack by Dr. Felecia Shelling two weeks ago and it helped only slightly.  She has pain in the mid lower back that goes to both feet.  She awakens with lower back pain at night. She has no trauma, no weakness.  She tries to be active but cannot now.    I have independently reviewed and interpreted x-rays of this patient done at another site by another physician or qualified health professional.    Review of Systems  Constitutional:  Positive for activity change.  Musculoskeletal:  Positive for arthralgias, back pain and myalgias.  All other systems reviewed and are negative. For Review of Systems, all other systems reviewed and are negative.  The following is a summary of the past history medically, past history surgically, known current medicines, social history and family history.  This information is gathered electronically by the computer from prior information and documentation.  I review this each visit and have found including this information at this point in the chart is beneficial and informative.   Past Medical History:  Diagnosis Date   Anemia    Anxiety    Asthma    as child   Cancer (HCC)    cervical cx at age 21   Cervical cancer (HCC)    Chronic back pain    Chronic kidney disease    CKD (chronic kidney disease), symptom management only, stage 3 (moderate) (HCC)    DDD (degenerative disc disease), lumbar    Facet arthritis   GERD (gastroesophageal reflux disease)    Hypotension    Left knee pain    Migraine    Peripheral neuropathy    Sciatica    Substance abuse (HCC)    Vaginal Pap smear, abnormal     Past Surgical History:  Procedure Laterality Date    CERVICAL CONIZATION W/BX N/A 04/01/2017   Procedure: LASER CONIZATION CERVIX WITH BIOPSY;  Surgeon: Lazaro Arms, MD;  Location: [facility];  Service: Gynecology;  Laterality: N/A;   CHOLECYSTECTOMY N/A 01/20/2013   Procedure: LAPAROSCOPIC CHOLECYSTECTOMY WITH INTRAOPERATIVE CHOLANGIOGRAM;  Surgeon: Kandis Cocking, MD;  Location: [facility];  Service: General;  Laterality: N/A;   DILATATION AND CURETTAGE/HYSTEROSCOPY WITH MINERVA N/A 11/10/2018   Procedure: DILATATION AND CURETTAGE/HYSTEROSCOPY WITH MINERVA;  Surgeon: Lazaro Arms, MD;  Location: [facility];  Service: Gynecology;  Laterality: N/A;   TUBAL LIGATION      Current Outpatient Medications on File Prior to Visit  Medication Sig Dispense Refill   albuterol (VENTOLIN HFA) 108 (90 Base) MCG/ACT inhaler Inhale 1-2 puffs into the lungs every 6 (six) hours as needed for wheezing or shortness of breath. 6.7 g 0   AMITRIPTYLINE HCL PO Take by mouth.     Ascorbic Acid (VITAMIN C PO) Take by mouth.     Calcium Carbonate Antacid (TUMS E-X PO) Take by mouth.     Citalopram Hydrobromide (CELEXA PO) Take by mouth.     dicyclomine (BENTYL) 20 MG tablet Take 1 tablet (20 mg total) by mouth 3 (three) times daily as needed for spasms (abdominal cramping). 20 tablet 0   Ferrous Sulfate (IRON PO) Take 1 tablet  by mouth daily.     Multiple Vitamin (MULTIVITAMIN WITH MINERALS) TABS tablet Take 1 tablet by mouth daily. WOMEN'S ONE A DAY     ondansetron (ZOFRAN ODT) 4 MG disintegrating tablet Take 1 tablet (4 mg total) by mouth every 8 (eight) hours as needed. 20 tablet 0   oxybutynin (DITROPAN) 5 MG tablet Take 1 tablet (5 mg total) by mouth 2 (two) times daily. 60 tablet 2   pantoprazole (PROTONIX) 40 MG tablet Take 1 tablet (40 mg total) by mouth daily. 30 tablet 0   predniSONE (DELTASONE) 10 MG tablet Day 1 take 6 tablets po qam. Day 2 take 5 tablets po qam. Day 3 take 4 tablets po qam. Day 4 take 3 tablets po qam. Day 5 take 2 tablets po qam. Day 6 take 1 tablet  po qam. 21 tablet 0   Probiotic Product (PROBIOTIC PO) Take 1 tablet by mouth daily.     VITAMIN D PO Take by mouth.     VITAMIN E PO Take by mouth.     No current facility-administered medications on file prior to visit.    Social History   Socioeconomic History   Marital status: Single    Spouse name: Not on file   Number of children: Not on file   Years of education: Not on file   Highest education level: Not on file  Occupational History   Not on file  Tobacco Use   Smoking status: Never   Smokeless tobacco: Never  Vaping Use   Vaping status: Never Used  Substance and Sexual Activity   Alcohol use: Yes    Alcohol/week: 0.0 standard drinks of alcohol    Comment: occasional   Drug use: Not Currently    Types: "Crack" cocaine   Sexual activity: Not Currently    Birth control/protection: Surgical  Other Topics Concern   Not on file  Social History Narrative   Not on file   Social Determinants of Health   Financial Resource Strain: Not on file  Food Insecurity: Not on file  Transportation Needs: Not on file  Physical Activity: Not on file  Stress: Not on file  Social Connections: Not on file  Intimate Partner Violence: Not on file    Family History  Problem Relation Age of Onset   Cancer Mother    Colon cancer Mother    Breast cancer Maternal Grandmother    Heart disease Maternal Grandmother    Cancer Maternal Grandmother    Breast cancer Paternal Grandmother    Heart disease Paternal Grandmother    Hypertension Paternal Grandmother    Alcohol abuse Father    Drug abuse Father    Heart disease Father    Asthma Brother    Cancer Paternal Grandfather     BP (!) 145/97   Pulse 91   Ht 5\' 6"  (1.676 m)   Wt 266 lb 6 oz (120.8 kg)   BMI 42.99 kg/m   Body mass index is 42.99 kg/m.      Objective:   Physical Exam Vitals and nursing note reviewed. Exam conducted with a chaperone present.  Constitutional:      Appearance: She is well-developed.   HENT:     Head: Normocephalic and atraumatic.  Eyes:     Conjunctiva/sclera: Conjunctivae normal.     Pupils: Pupils are equal, round, and reactive to light.  Cardiovascular:     Rate and Rhythm: Normal rate and regular rhythm.  Pulmonary:     Effort: Pulmonary  effort is normal.  Abdominal:     Palpations: Abdomen is soft.  Musculoskeletal:       Arms:     Cervical back: Normal range of motion and neck supple.  Skin:    General: Skin is warm and dry.  Neurological:     Mental Status: She is alert and oriented to person, place, and time.     Cranial Nerves: No cranial nerve deficit.     Motor: No abnormal muscle tone.     Coordination: Coordination normal.     Deep Tendon Reflexes: Reflexes are normal and symmetric. Reflexes normal.  Psychiatric:        Behavior: Behavior normal.        Thought Content: Thought content normal.        Judgment: Judgment normal.           Assessment & Plan:   Encounter Diagnosis  Name Primary?   Chronic bilateral low back pain with bilateral sciatica Yes   I will begin PT for her.  I will give pain medicine.  I was going to give naprosyn but she is taking a medicine contraindicated to take with the Naprosyn.  Return in one month.  Consider MRI.  Call if any problem.  Precautions discussed.  Electronically Signed Darreld Mclean, MD 8/6/20249:59 AM

## 2022-10-07 ENCOUNTER — Ambulatory Visit: Payer: 59 | Admitting: Orthopaedic Surgery

## 2022-10-09 ENCOUNTER — Other Ambulatory Visit: Payer: Self-pay

## 2022-10-09 ENCOUNTER — Ambulatory Visit (HOSPITAL_COMMUNITY): Payer: 59 | Attending: Orthopaedic Surgery

## 2022-10-09 DIAGNOSIS — G8929 Other chronic pain: Secondary | ICD-10-CM | POA: Diagnosis not present

## 2022-10-09 DIAGNOSIS — M5442 Lumbago with sciatica, left side: Secondary | ICD-10-CM | POA: Insufficient documentation

## 2022-10-09 DIAGNOSIS — M545 Low back pain, unspecified: Secondary | ICD-10-CM | POA: Insufficient documentation

## 2022-10-09 DIAGNOSIS — M5441 Lumbago with sciatica, right side: Secondary | ICD-10-CM | POA: Insufficient documentation

## 2022-10-09 DIAGNOSIS — R262 Difficulty in walking, not elsewhere classified: Secondary | ICD-10-CM | POA: Diagnosis not present
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
## 2022-10-15 ENCOUNTER — Encounter (HOSPITAL_COMMUNITY): Payer: Self-pay

## 2022-10-15 ENCOUNTER — Encounter (HOSPITAL_COMMUNITY): Payer: 59 | Admitting: Physical Therapy

## 2022-10-15 ENCOUNTER — Encounter (HOSPITAL_COMMUNITY): Payer: 59

## 2022-10-15 ENCOUNTER — Ambulatory Visit (HOSPITAL_COMMUNITY): Payer: 59 | Admitting: Physical Therapy

## 2022-10-17 ENCOUNTER — Ambulatory Visit (HOSPITAL_COMMUNITY): Payer: 59

## 2022-10-17 DIAGNOSIS — G8929 Other chronic pain: Secondary | ICD-10-CM | POA: Diagnosis not present

## 2022-10-17 DIAGNOSIS — M545 Low back pain, unspecified: Secondary | ICD-10-CM

## 2022-10-17 DIAGNOSIS — M5442 Lumbago with sciatica, left side: Secondary | ICD-10-CM | POA: Diagnosis not present

## 2022-10-17 DIAGNOSIS — R262 Difficulty in walking, not elsewhere classified: Secondary | ICD-10-CM

## 2022-10-17 DIAGNOSIS — M5441 Lumbago with sciatica, right side: Secondary | ICD-10-CM | POA: Diagnosis not present
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
## 2022-10-21 ENCOUNTER — Encounter (HOSPITAL_COMMUNITY): Payer: 59 | Admitting: Physical Therapy

## 2022-10-22 DIAGNOSIS — E559 Vitamin D deficiency, unspecified: Secondary | ICD-10-CM | POA: Diagnosis not present

## 2022-10-22 DIAGNOSIS — Z23 Encounter for immunization: Secondary | ICD-10-CM | POA: Diagnosis not present

## 2022-10-22 DIAGNOSIS — N183 Chronic kidney disease, stage 3 unspecified: Secondary | ICD-10-CM | POA: Diagnosis not present

## 2022-10-22 DIAGNOSIS — F331 Major depressive disorder, recurrent, moderate: Secondary | ICD-10-CM | POA: Diagnosis not present

## 2022-10-23 ENCOUNTER — Telehealth (HOSPITAL_COMMUNITY): Payer: Self-pay | Admitting: Physical Therapy

## 2022-10-23 ENCOUNTER — Encounter (HOSPITAL_COMMUNITY): Payer: 59 | Admitting: Physical Therapy

## 2022-10-23 NOTE — Telephone Encounter (Signed)
First no show.   Pt forgot about her appointment.  Therapist reminded pt of her appointment on Monday.  Virgina Organ, PT CLT [phone]

## 2022-10-27 ENCOUNTER — Telehealth (HOSPITAL_COMMUNITY): Payer: Self-pay

## 2022-10-27 ENCOUNTER — Encounter (HOSPITAL_COMMUNITY): Payer: 59

## 2022-10-27 NOTE — Telephone Encounter (Signed)
No show #2, called and spoke to pt who stated she thought her apt was on Tuesday. Reminded next apt date and time for next apt and no show policy details with need to schedule apt one at a time with verbalized understanding.   Becky Sax, LPTA/CLT; [place] [phone]

## 2022-10-29 ENCOUNTER — Ambulatory Visit (HOSPITAL_COMMUNITY): Payer: 59 | Admitting: Physical Therapy

## 2022-10-29 DIAGNOSIS — G8929 Other chronic pain: Secondary | ICD-10-CM | POA: Diagnosis not present

## 2022-10-29 DIAGNOSIS — M5442 Lumbago with sciatica, left side: Secondary | ICD-10-CM | POA: Diagnosis not present

## 2022-10-29 DIAGNOSIS — M5441 Lumbago with sciatica, right side: Secondary | ICD-10-CM | POA: Diagnosis not present

## 2022-10-29 DIAGNOSIS — M545 Low back pain, unspecified: Secondary | ICD-10-CM

## 2022-10-29 DIAGNOSIS — R262 Difficulty in walking, not elsewhere classified: Secondary | ICD-10-CM | POA: Diagnosis not present
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
## 2022-11-03 ENCOUNTER — Encounter (HOSPITAL_COMMUNITY): Payer: 59

## 2022-11-06 ENCOUNTER — Encounter (HOSPITAL_COMMUNITY): Payer: 59

## 2022-11-10 ENCOUNTER — Encounter (HOSPITAL_COMMUNITY): Payer: 59 | Admitting: Physical Therapy

## 2022-11-10 ENCOUNTER — Other Ambulatory Visit (HOSPITAL_COMMUNITY)
Admission: RE | Admit: 2022-11-10 | Discharge: 2022-11-10 | Disposition: A | Discharge status: Home / Self Care | Payer: 59 | Source: Ambulatory Visit | Attending: Internal Medicine | Admitting: Internal Medicine

## 2022-11-10 DIAGNOSIS — E559 Vitamin D deficiency, unspecified: Secondary | ICD-10-CM | POA: Diagnosis not present

## 2022-11-10 LAB — VITAMIN D 25 HYDROXY (VIT D DEFICIENCY, FRACTURES): Vit D, 25-Hydroxy: 45.08 ng/mL (ref 30–100)

## 2022-11-12 ENCOUNTER — Encounter (HOSPITAL_COMMUNITY): Payer: 59

## 2022-11-13 ENCOUNTER — Encounter (HOSPITAL_COMMUNITY): Payer: Self-pay

## 2022-11-13 ENCOUNTER — Telehealth: Payer: Self-pay | Admitting: Orthopaedic Surgery

## 2022-11-13 ENCOUNTER — Ambulatory Visit (HOSPITAL_COMMUNITY): Payer: 59 | Admitting: Physical Therapy

## 2022-11-13 DIAGNOSIS — M545 Low back pain, unspecified: Secondary | ICD-10-CM | POA: Insufficient documentation

## 2022-11-13 DIAGNOSIS — G8929 Other chronic pain: Secondary | ICD-10-CM | POA: Insufficient documentation

## 2022-11-13 DIAGNOSIS — R262 Difficulty in walking, not elsewhere classified: Secondary | ICD-10-CM | POA: Insufficient documentation

## 2022-11-13 DIAGNOSIS — M5441 Lumbago with sciatica, right side: Secondary | ICD-10-CM | POA: Insufficient documentation

## 2022-11-13 DIAGNOSIS — M5442 Lumbago with sciatica, left side: Secondary | ICD-10-CM | POA: Insufficient documentation

## 2022-11-13 MED ORDER — HYDROCODONE-ACETAMINOPHEN 5-325 MG PO TABS: ORAL_TABLET | ORAL | 0 refills | Status: AC

## 2022-11-13 NOTE — Therapy (Signed)
PHYSICAL THERAPY DISCHARGE SUMMARY      Visits from Start of Care: 3  Pt came to department, was signed in prior to pt stating that she came to cancel her appointment and that she was going to her MD and let him know that she was not going to therapy any longer as it was not helping.  Pt seen for 2 visits only as the first visit was an evaluation.    Current functional level related to goals / functional outcomes: Pt self discharged states PT is no helping    Remaining deficits: Unknown as pt did not return for a reassessment    Education / Equipment: HEP   Patient agrees to discharge. Patient goals were not met. Patient is being discharged due to the patient's request.  Virgina Organ, PT CLT [phone]

## 2022-11-13 NOTE — Telephone Encounter (Signed)
DR. Hilda Lias   Patient came in the office to quest a refill on her pain medicine.   HYDROcodone-acetaminophen (NORCO/VICODIN) 5-325 MG tablet   Pharmacy: [facility]

## 2023-01-06 ENCOUNTER — Other Ambulatory Visit: Payer: Self-pay

## 2023-01-06 ENCOUNTER — Encounter (HOSPITAL_COMMUNITY): Payer: Self-pay | Admitting: Emergency Medicine

## 2023-01-06 ENCOUNTER — Emergency Department (HOSPITAL_COMMUNITY): Payer: 59

## 2023-01-06 ENCOUNTER — Observation Stay (HOSPITAL_COMMUNITY)
Admission: EM | Admit: 2023-01-06 | Discharge: 2023-01-07 | Discharge status: Against Medical Advice | Payer: 59 | Attending: Internal Medicine | Admitting: Internal Medicine

## 2023-01-06 DIAGNOSIS — Z6841 Body Mass Index (BMI) 40.0 and over, adult: Secondary | ICD-10-CM | POA: Diagnosis not present

## 2023-01-06 DIAGNOSIS — Z79899 Other long term (current) drug therapy: Secondary | ICD-10-CM | POA: Insufficient documentation

## 2023-01-06 DIAGNOSIS — N3001 Acute cystitis with hematuria: Secondary | ICD-10-CM | POA: Insufficient documentation

## 2023-01-06 DIAGNOSIS — F329 Major depressive disorder, single episode, unspecified: Secondary | ICD-10-CM | POA: Diagnosis present

## 2023-01-06 DIAGNOSIS — E66813 Obesity, class 3: Secondary | ICD-10-CM | POA: Insufficient documentation

## 2023-01-06 DIAGNOSIS — N39498 Other specified urinary incontinence: Secondary | ICD-10-CM | POA: Diagnosis not present

## 2023-01-06 DIAGNOSIS — A419 Sepsis, unspecified organism: Secondary | ICD-10-CM | POA: Insufficient documentation

## 2023-01-06 DIAGNOSIS — R0602 Shortness of breath: Secondary | ICD-10-CM | POA: Diagnosis not present

## 2023-01-06 DIAGNOSIS — M545 Low back pain, unspecified: Secondary | ICD-10-CM | POA: Diagnosis not present

## 2023-01-06 DIAGNOSIS — J189 Pneumonia, unspecified organism: Principal | ICD-10-CM | POA: Diagnosis present

## 2023-01-06 DIAGNOSIS — R918 Other nonspecific abnormal finding of lung field: Secondary | ICD-10-CM | POA: Diagnosis not present

## 2023-01-06 DIAGNOSIS — F32A Depression, unspecified: Secondary | ICD-10-CM | POA: Diagnosis not present

## 2023-01-06 DIAGNOSIS — E876 Hypokalemia: Secondary | ICD-10-CM | POA: Insufficient documentation

## 2023-01-06 DIAGNOSIS — R739 Hyperglycemia, unspecified: Secondary | ICD-10-CM | POA: Insufficient documentation

## 2023-01-06 DIAGNOSIS — G629 Polyneuropathy, unspecified: Secondary | ICD-10-CM

## 2023-01-06 DIAGNOSIS — R32 Unspecified urinary incontinence: Secondary | ICD-10-CM | POA: Insufficient documentation

## 2023-01-06 DIAGNOSIS — N390 Urinary tract infection, site not specified: Secondary | ICD-10-CM | POA: Insufficient documentation

## 2023-01-06 LAB — CBC WITH DIFFERENTIAL/PLATELET
Abs Immature Granulocytes: 0.03 10*3/uL (ref 0.00–0.07)
Basophils Absolute: 0 10*3/uL (ref 0.0–0.1)
Basophils Relative: 0 %
Eosinophils Absolute: 0.1 10*3/uL (ref 0.0–0.5)
Eosinophils Relative: 1 %
HCT: 42.7 % (ref 36.0–46.0)
Hemoglobin: 13.6 g/dL (ref 12.0–15.0)
Immature Granulocytes: 0 %
Lymphocytes Relative: 12 %
Lymphs Abs: 1.1 10*3/uL (ref 0.7–4.0)
MCH: 28.9 pg (ref 26.0–34.0)
MCHC: 31.9 g/dL (ref 30.0–36.0)
MCV: 90.9 fL (ref 80.0–100.0)
Monocytes Absolute: 1 10*3/uL (ref 0.1–1.0)
Monocytes Relative: 11 %
Neutro Abs: 6.7 10*3/uL (ref 1.7–7.7)
Neutrophils Relative %: 76 %
Platelets: 192 10*3/uL (ref 150–400)
RBC: 4.7 MIL/uL (ref 3.87–5.11)
RDW: 13.5 % (ref 11.5–15.5)
WBC: 8.8 10*3/uL (ref 4.0–10.5)
nRBC: 0 % (ref 0.0–0.2)

## 2023-01-06 LAB — URINALYSIS, ROUTINE W REFLEX MICROSCOPIC
Bilirubin Urine: NEGATIVE
Glucose, UA: NEGATIVE mg/dL
Ketones, ur: NEGATIVE mg/dL
Nitrite: POSITIVE — AB
Protein, ur: 30 mg/dL — AB
Specific Gravity, Urine: 1.011 (ref 1.005–1.030)
WBC, UA: >50 WBC/hpf (ref 0–5)
pH: 6 (ref 5.0–8.0)

## 2023-01-06 LAB — BASIC METABOLIC PANEL
Anion gap: 11 (ref 5–15)
BUN: 9 mg/dL (ref 6–20)
CO2: 25 mmol/L (ref 22–32)
Calcium: 8.4 mg/dL — ABNORMAL LOW (ref 8.9–10.3)
Chloride: 105 mmol/L (ref 98–111)
Creatinine, Ser: 1.06 mg/dL — ABNORMAL HIGH (ref 0.44–1.00)
GFR, Estimated: >60 mL/min (ref >60)
Glucose, Bld: 157 mg/dL — ABNORMAL HIGH (ref 70–99)
Potassium: 3.4 mmol/L — ABNORMAL LOW (ref 3.5–5.1)
Sodium: 141 mmol/L (ref 135–145)

## 2023-01-06 MED ORDER — SODIUM CHLORIDE 0.9 % IV BOLUS
1000.0000 mL | Freq: Once | INTRAVENOUS | Status: AC
  Administered 2023-01-06: 1000 mL via INTRAVENOUS

## 2023-01-06 MED ORDER — HYDROMORPHONE HCL 1 MG/ML IJ SOLN
0.5000 mg | Freq: Once | INTRAMUSCULAR | Status: AC
  Administered 2023-01-06: 0.5 mg via INTRAVENOUS
  Filled 2023-01-06: qty 0.5

## 2023-01-06 MED ORDER — SODIUM CHLORIDE 0.9 % IV SOLN
500.0000 mg | Freq: Once | INTRAVENOUS | Status: AC
  Administered 2023-01-06: 500 mg via INTRAVENOUS
  Filled 2023-01-06: qty 5

## 2023-01-06 MED ORDER — SODIUM CHLORIDE 0.9 % IV SOLN
2.0000 g | Freq: Once | INTRAVENOUS | Status: AC
  Administered 2023-01-06: 2 g via INTRAVENOUS
  Filled 2023-01-06: qty 20

## 2023-01-06 NOTE — ED Triage Notes (Addendum)
Pt c/o all over body pain/shob x 2 days. Pt has been incontinent of urine. Pt states she just found her best friend dead and keep repeating "I don't know what I'm going to do". When asked is she feels like she wants to hurt/kill self or wish she was dead, pt states "I don't know what to do". Pt very tearful.

## 2023-01-06 NOTE — ED Provider Notes (Signed)
[place] EMERGENCY DEPARTMENT AT [facility] Provider Note   CSN: [id number] Arrival date & time: 01/06/23  1347     History {Add pertinent medical, surgical, social history, OB history to HPI:1} Chief Complaint  Patient presents with   Pain    Kristi Martin is a 52 y.o. female.   Fever      Home Medications Prior to Admission medications   Medication Sig Start Date End Date Taking? Authorizing Provider  albuterol (VENTOLIN HFA) 108 (90 Base) MCG/ACT inhaler Inhale 1-2 puffs into the lungs every 6 (six) hours as needed for wheezing or shortness of breath. 02/09/20  Yes Erick Blinks, MD  AMITRIPTYLINE HCL PO Take 1 tablet by mouth daily.   Yes [provider]  Ascorbic Acid (VITAMIN C PO) Take 1 tablet by mouth daily.   Yes [provider]  Calcium Carbonate Antacid (TUMS E-X PO) Take 1 tablet by mouth daily.   Yes [provider]  Citalopram Hydrobromide (CELEXA PO) Take 1 tablet by mouth daily.   Yes [provider]  Ferrous Sulfate (IRON PO) Take 1 tablet by mouth daily.   Yes [provider]  HYDROcodone-acetaminophen (NORCO/VICODIN) 5-325 MG tablet One tablet by mouth every six hours as needed for pain.  Seven day limit Patient taking differently: Take 1 tablet by mouth every 6 (six) hours as needed for moderate pain (pain score 4-6). One tablet by mouth every six hours as needed for pain.  Seven day limit 11/13/22  Yes Darreld Mclean, MD  Multiple Vitamin (MULTIVITAMIN WITH MINERALS) TABS tablet Take 1 tablet by mouth daily. WOMEN'S ONE A DAY   Yes [provider]  ondansetron (ZOFRAN ODT) 4 MG disintegrating tablet Take 1 tablet (4 mg total) by mouth every 8 (eight) hours as needed. 04/02/20  Yes Jacquelin Hawking, PA-C  Probiotic Product (PROBIOTIC PO) Take 1 tablet by mouth daily.   Yes [provider]  sertraline (ZOLOFT) 25 MG tablet Take 25 mg by mouth daily. 08/21/22  Yes [provider]  Vitamin D, Ergocalciferol, (DRISDOL) 1.25 MG (50000 UNIT) CAPS capsule Take 50,000 Units by mouth once a week. 08/17/22  Yes [provider]      Allergies    Benadryl [diphenhydramine hcl] and Onion    Review of Systems   Review of Systems  Constitutional:  Positive for fever.    Physical Exam Updated Vital Signs BP 110/81   Pulse (!) 110   Temp 100.3 F (37.9 C) (Oral)   Resp 16   SpO2 95%  Physical Exam  ED Results / Procedures / Treatments   Labs (all labs ordered are listed, but only abnormal results are displayed) Labs Reviewed  BASIC METABOLIC PANEL - Abnormal; Notable for the following components:      Result Value   Potassium 3.4 (*)    Glucose, Bld 157 (*)    Creatinine, Ser 1.06 (*)    Calcium 8.4 (*)    All other components within normal limits  URINALYSIS, ROUTINE W REFLEX MICROSCOPIC - Abnormal; Notable for the following components:   APPearance HAZY (*)    Hgb urine dipstick SMALL (*)    Protein, ur 30 (*)    Nitrite POSITIVE (*)    Leukocytes,Ua LARGE (*)    Bacteria, UA MANY (*)    All other components within normal limits  URINE CULTURE  RESP PANEL BY RT-PCR (RSV, FLU A&B, COVID)  [id number]  CBC WITH DIFFERENTIAL/PLATELET    EKG None  Radiology DG Chest 2 View  Result Date: 01/06/2023 CLINICAL DATA:  Shortness of breath for 2 days EXAM: CHEST - 2 VIEW COMPARISON:  03/25/2021 FINDINGS: Low volume AP examination. The heart size and mediastinal contours are within normal limits. Mild diffuse interstitial opacity. Disc degenerative disease of the thoracic spine. IMPRESSION: Low volume AP examination with mild diffuse interstitial opacity, suggestive of edema or atypical/viral infection, appearance possibly exaggerated by low volume technique and bronchovascular crowding. No focal airspace opacity. Electronically Signed   By: Jearld Lesch M.D.   On: 01/06/2023 14:59    Procedures Procedures  {Document cardiac monitor, telemetry assessment  procedure when appropriate:1}  Medications Ordered in ED Medications  azithromycin (ZITHROMAX) 500 mg in sodium chloride 0.9 % 250 mL IVPB (500 mg Intravenous New Bag/Given 01/06/23 2330)  sodium chloride 0.9 % bolus 1,000 mL (0 mLs Intravenous Stopped 01/06/23 2247)  cefTRIAXone (ROCEPHIN) 2 g in sodium chloride 0.9 % 100 mL IVPB (0 g Intravenous Stopped 01/06/23 2242)  HYDROmorphone (DILAUDID) injection 0.5 mg (0.5 mg Intravenous Given 01/06/23 2054)    ED Course/ Medical Decision Making/ A&P   {   Click here for ABCD2, HEART and other calculatorsREFRESH Note before signing :1}                              Medical Decision Making Amount and/or Complexity of Data Reviewed Labs: ordered. Radiology: ordered.  Risk Prescription drug management. Decision regarding hospitalization.   Patient with urinary tract infection and pneumonia.  She will be admitted to medicine  {Document critical care time when appropriate:1} {Document review of labs and clinical decision tools ie heart score, Chads2Vasc2 etc:1}  {Document your independent review of radiology images, and any outside records:1} {Document your discussion with family members, caretakers, and with consultants:1} {Document social determinants of health affecting pt's care:1} {Document your decision making why or why not admission, treatments were needed:1} Final Clinical Impression(s) / ED Diagnoses Final diagnoses:  Community acquired pneumonia, unspecified laterality  Acute cystitis with hematuria    Rx / DC Orders ED Discharge Orders     None

## 2023-01-07 ENCOUNTER — Inpatient Hospital Stay (HOSPITAL_COMMUNITY): Payer: 59

## 2023-01-07 DIAGNOSIS — R32 Unspecified urinary incontinence: Secondary | ICD-10-CM

## 2023-01-07 DIAGNOSIS — M5127 Other intervertebral disc displacement, lumbosacral region: Secondary | ICD-10-CM | POA: Diagnosis not present

## 2023-01-07 DIAGNOSIS — A419 Sepsis, unspecified organism: Secondary | ICD-10-CM | POA: Diagnosis not present

## 2023-01-07 DIAGNOSIS — M5440 Lumbago with sciatica, unspecified side: Secondary | ICD-10-CM

## 2023-01-07 DIAGNOSIS — E876 Hypokalemia: Secondary | ICD-10-CM | POA: Diagnosis not present

## 2023-01-07 DIAGNOSIS — R739 Hyperglycemia, unspecified: Secondary | ICD-10-CM

## 2023-01-07 DIAGNOSIS — N390 Urinary tract infection, site not specified: Secondary | ICD-10-CM | POA: Diagnosis not present

## 2023-01-07 DIAGNOSIS — G629 Polyneuropathy, unspecified: Secondary | ICD-10-CM

## 2023-01-07 DIAGNOSIS — E66813 Obesity, class 3: Secondary | ICD-10-CM | POA: Insufficient documentation

## 2023-01-07 DIAGNOSIS — J189 Pneumonia, unspecified organism: Secondary | ICD-10-CM

## 2023-01-07 DIAGNOSIS — F32A Depression, unspecified: Secondary | ICD-10-CM | POA: Diagnosis not present

## 2023-01-07 DIAGNOSIS — C539 Malignant neoplasm of cervix uteri, unspecified: Secondary | ICD-10-CM | POA: Diagnosis not present

## 2023-01-07 DIAGNOSIS — M5136 Other intervertebral disc degeneration, lumbar region with discogenic back pain only: Secondary | ICD-10-CM | POA: Diagnosis not present

## 2023-01-07 DIAGNOSIS — M5125 Other intervertebral disc displacement, thoracolumbar region: Secondary | ICD-10-CM | POA: Diagnosis not present

## 2023-01-07 DIAGNOSIS — M545 Low back pain, unspecified: Secondary | ICD-10-CM | POA: Insufficient documentation

## 2023-01-07 LAB — CBC
HCT: 42.5 % (ref 36.0–46.0)
Hemoglobin: 13.6 g/dL (ref 12.0–15.0)
MCH: 28.9 pg (ref 26.0–34.0)
MCHC: 32 g/dL (ref 30.0–36.0)
MCV: 90.2 fL (ref 80.0–100.0)
Platelets: 207 10*3/uL (ref 150–400)
RBC: 4.71 MIL/uL (ref 3.87–5.11)
RDW: 13.2 % (ref 11.5–15.5)
WBC: 5.9 10*3/uL (ref 4.0–10.5)
nRBC: 0 % (ref 0.0–0.2)

## 2023-01-07 LAB — HIV ANTIBODY (ROUTINE TESTING W REFLEX): HIV Screen 4th Generation wRfx: NONREACTIVE

## 2023-01-07 LAB — COMPREHENSIVE METABOLIC PANEL
ALT: 24 U/L (ref 0–44)
AST: 20 U/L (ref 15–41)
Albumin: 3 g/dL — ABNORMAL LOW (ref 3.5–5.0)
Alkaline Phosphatase: 69 U/L (ref 38–126)
Anion gap: 9 (ref 5–15)
BUN: 9 mg/dL (ref 6–20)
CO2: 22 mmol/L (ref 22–32)
Calcium: 8.6 mg/dL — ABNORMAL LOW (ref 8.9–10.3)
Chloride: 108 mmol/L (ref 98–111)
Creatinine, Ser: 0.92 mg/dL (ref 0.44–1.00)
GFR, Estimated: >60 mL/min (ref >60)
Glucose, Bld: 165 mg/dL — ABNORMAL HIGH (ref 70–99)
Potassium: 4 mmol/L (ref 3.5–5.1)
Sodium: 139 mmol/L (ref 135–145)
Total Bilirubin: 0.3 mg/dL (ref <1.2)
Total Protein: 7 g/dL (ref 6.5–8.1)

## 2023-01-07 LAB — PHOSPHORUS: Phosphorus: 2.6 mg/dL (ref 2.5–4.6)

## 2023-01-07 LAB — RESP PANEL BY RT-PCR (RSV, FLU A&B, COVID)  RVPGX2
Influenza A by PCR: NEGATIVE
Influenza B by PCR: NEGATIVE
Resp Syncytial Virus by PCR: NEGATIVE
SARS Coronavirus 2 by RT PCR: NEGATIVE

## 2023-01-07 LAB — MAGNESIUM: Magnesium: 2.3 mg/dL (ref 1.7–2.4)

## 2023-01-07 MED ORDER — ACETAMINOPHEN 650 MG RE SUPP: 650.0000 mg | Freq: Four times a day (QID) | RECTAL | Status: DC | PRN

## 2023-01-07 MED ORDER — ENOXAPARIN SODIUM 60 MG/0.6ML IJ SOSY
60.0000 mg | PREFILLED_SYRINGE | INTRAMUSCULAR | Status: DC
  Administered 2023-01-07: 60 mg via SUBCUTANEOUS
  Filled 2023-01-07: qty 0.6

## 2023-01-07 MED ORDER — SODIUM CHLORIDE 0.9 % IV SOLN: 1.0000 g | INTRAVENOUS | Status: DC

## 2023-01-07 MED ORDER — METHYLPREDNISOLONE SODIUM SUCC 40 MG IJ SOLR
40.0000 mg | Freq: Once | INTRAMUSCULAR | Status: AC
  Administered 2023-01-07: 40 mg via INTRAVENOUS
  Filled 2023-01-07: qty 1

## 2023-01-07 MED ORDER — ALBUTEROL SULFATE (2.5 MG/3ML) 0.083% IN NEBU: 3.0000 mL | INHALATION_SOLUTION | Freq: Four times a day (QID) | RESPIRATORY_TRACT | Status: DC | PRN

## 2023-01-07 MED ORDER — ONDANSETRON HCL 4 MG PO TABS: 4.0000 mg | ORAL_TABLET | Freq: Four times a day (QID) | ORAL | Status: DC | PRN

## 2023-01-07 MED ORDER — GADOBUTROL 1 MMOL/ML IV SOLN
10.0000 mL | Freq: Once | INTRAVENOUS | Status: AC | PRN
  Administered 2023-01-07: 10 mL via INTRAVENOUS

## 2023-01-07 MED ORDER — ONDANSETRON HCL 4 MG/2ML IJ SOLN: 4.0000 mg | Freq: Four times a day (QID) | INTRAMUSCULAR | Status: DC | PRN

## 2023-01-07 MED ORDER — POTASSIUM CHLORIDE CRYS ER 20 MEQ PO TBCR
40.0000 meq | EXTENDED_RELEASE_TABLET | Freq: Once | ORAL | Status: AC
  Administered 2023-01-07: 40 meq via ORAL
  Filled 2023-01-07: qty 2

## 2023-01-07 MED ORDER — DEXTROSE 5 % IV SOLN
500.0000 mg | INTRAVENOUS | Status: DC
  Filled 2023-01-07: qty 5

## 2023-01-07 MED ORDER — LORAZEPAM 2 MG/ML IJ SOLN
1.0000 mg | Freq: Once | INTRAMUSCULAR | Status: AC
  Administered 2023-01-07: 1 mg via INTRAVENOUS
  Filled 2023-01-07: qty 1

## 2023-01-07 MED ORDER — OXYCODONE HCL 5 MG PO TABS: 5.0000 mg | ORAL_TABLET | ORAL | Status: DC | PRN

## 2023-01-07 MED ORDER — DM-GUAIFENESIN ER 30-600 MG PO TB12
1.0000 | ORAL_TABLET | Freq: Two times a day (BID) | ORAL | Status: DC
  Administered 2023-01-07: 1 via ORAL
  Filled 2023-01-07: qty 1

## 2023-01-07 MED ORDER — ACETAMINOPHEN 325 MG PO TABS: 650.0000 mg | ORAL_TABLET | Freq: Four times a day (QID) | ORAL | Status: DC | PRN

## 2023-01-07 MED ORDER — SERTRALINE HCL 50 MG PO TABS
25.0000 mg | ORAL_TABLET | Freq: Every day | ORAL | Status: DC
  Administered 2023-01-07: 25 mg via ORAL
  Filled 2023-01-07: qty 1

## 2023-01-07 MED ORDER — OXYBUTYNIN CHLORIDE 5 MG PO TABS
5.0000 mg | ORAL_TABLET | Freq: Three times a day (TID) | ORAL | Status: DC
Start: 2023-01-07 — End: 2023-01-07
  Administered 2023-01-07: 5 mg via ORAL
  Filled 2023-01-07: qty 1

## 2023-01-07 MED ORDER — ENOXAPARIN SODIUM 40 MG/0.4ML IJ SOSY: 40.0000 mg | PREFILLED_SYRINGE | INTRAMUSCULAR | Status: DC

## 2023-01-07 NOTE — TOC Progression Note (Signed)
Transition of Care [facility]) - Progression Note    Patient Details  Name: Kristi Martin MRN: [id number] Date of Birth: 1970-05-29  Transition of Care [facility]) CM/SW Contact  Karn Cassis, [place] Phone Number: 01/07/2023, 12:32 PM  Clinical Narrative: PT recommending outpatient PT and cane. Discussed with pt who is agreeable to outpatient PT at [facility]. Referral made. Pt states she thinks she has a cane, but it not will get on her own. Pt said she has to leave today to care for her animals. MD and RN notified.         Barriers to Discharge: Continued Medical Work up  Expected Discharge Plan and Services                                               Social Determinants of Health (SDOH) Interventions SDOH Screenings   Food Insecurity: No Food Insecurity (01/07/2023)  Housing: Patient Declined (01/07/2023)  Transportation Needs: No Transportation Needs (01/07/2023)  Utilities: Not At Risk (01/07/2023)  Tobacco Use: Low Risk  (01/06/2023)    Readmission Risk Interventions     No data to display

## 2023-01-07 NOTE — Progress Notes (Signed)
Patient seen and examined; admitted after midnight secondary to fever, shortness of breath, intermittent coughing spells and incontinence with associated lower back pain.  Workup demonstrating sepsis secondary to community-acquired pneumonia/UTI.  Hemodynamically stable after intervention initiated.  Patient received antipyretics, fluid resuscitation, and initiation of antibiotics (culture previously taken).  Please refer to H&P written by Dr. Thomes Dinning on 03/09/2022 for further info/details on admission.  Plan: -Continue IV antibiotics -Maintain adequate hydration and continue supportive care. -Follow culture results. -Follow clinical response.   Vassie Loll MD [phone]

## 2023-01-07 NOTE — ED Notes (Signed)
ED TO INPATIENT HANDOFF REPORT  ED Nurse Name and Phone #: Johnney Killian Name/Age/Gender Kristi Martin 52 y.o. female Room/Bed: APA04/APA04  Code Status   Code Status: Prior  Home/SNF/Other Home Patient oriented to: self, place, time, and situation Is this baseline? Yes   Triage Complete: Triage complete  Chief Complaint CAP (community acquired pneumonia) [J18.9]  Triage Note Pt c/o all over body pain/shob x 2 days. Pt has been incontinent of urine. Pt states she just found her best friend dead and keep repeating "I don't know what I'm going to do". When asked is she feels like she wants to hurt/kill self or wish she was dead, pt states "I don't know what to do". Pt very tearful.   Allergies Allergies  Allergen Reactions   Benadryl [Diphenhydramine Hcl] Anaphylaxis, Swelling and Other (See Comments)    Back of throat closes   Onion Anaphylaxis    Level of Care/Admitting Diagnosis ED Disposition     ED Disposition  Admit   Condition  --   Comment  Hospital Area: [facility] [[id number]]  Level of Care: Med-Surg [16]  Covid Evaluation: Asymptomatic - no recent exposure (last 10 days) testing not required  Diagnosis: CAP (community acquired pneumonia) [147829]  Admitting Physician: Frankey Shown [[id number]]  Attending Physician: Frankey Shown [[id number]]  Certification:: I certify this patient will need inpatient services for at least 2 midnights  Expected Medical Readiness: 01/10/2023          B Medical/Surgery History Past Medical History:  Diagnosis Date   Anemia    Anxiety    Asthma    as child   Cancer (HCC)    cervical cx at age 32   Cervical cancer (HCC)    Chronic back pain    Chronic kidney disease    CKD (chronic kidney disease), symptom management only, stage 3 (moderate) (HCC)    DDD (degenerative disc disease), lumbar    Facet arthritis   GERD (gastroesophageal reflux disease)    Hypotension    Left knee pain    Migraine     Peripheral neuropathy    Sciatica    Substance abuse (HCC)    Vaginal Pap smear, abnormal    Past Surgical History:  Procedure Laterality Date   CERVICAL CONIZATION W/BX N/A 04/01/2017   Procedure: LASER CONIZATION CERVIX WITH BIOPSY;  Surgeon: Lazaro Arms, MD;  Location: [facility];  Service: Gynecology;  Laterality: N/A;   CHOLECYSTECTOMY N/A 01/20/2013   Procedure: LAPAROSCOPIC CHOLECYSTECTOMY WITH INTRAOPERATIVE CHOLANGIOGRAM;  Surgeon: Kandis Cocking, MD;  Location: [facility];  Service: General;  Laterality: N/A;   DILATATION AND CURETTAGE/HYSTEROSCOPY WITH MINERVA N/A 11/10/2018   Procedure: DILATATION AND CURETTAGE/HYSTEROSCOPY WITH MINERVA;  Surgeon: Lazaro Arms, MD;  Location: [facility];  Service: Gynecology;  Laterality: N/A;   TUBAL LIGATION       A IV Location/Drains/Wounds Patient Lines/Drains/Airways Status     Active Line/Drains/Airways     Name Placement date Placement time Site Days   Peripheral IV 01/06/23 20 G Left Antecubital 01/06/23  1906  Antecubital  1            Intake/Output Last 24 hours  Intake/Output Summary (Last 24 hours) at 01/07/2023 0136 Last data filed at 01/07/2023 0030 Gross per 24 hour  Intake 1349.79 ml  Output --  Net 1349.79 ml    Labs/Imaging Results for orders placed or performed during the hospital encounter of 01/06/23 (from the past 48 hour(s))  CBC  with Differential     Status: None   Collection Time: 01/06/23  2:23 PM  Result Value Ref Range   WBC 8.8 4.0 - 10.5 K/uL   RBC 4.70 3.87 - 5.11 MIL/uL   Hemoglobin 13.6 12.0 - 15.0 g/dL   HCT 78.2 95.6 - 21.3 %   MCV 90.9 80.0 - 100.0 fL   MCH 28.9 26.0 - 34.0 pg   MCHC 31.9 30.0 - 36.0 g/dL   RDW 08.6 57.8 - 46.9 %   Platelets 192 150 - 400 K/uL   nRBC 0.0 0.0 - 0.2 %   Neutrophils Relative % 76 %   Neutro Abs 6.7 1.7 - 7.7 K/uL   Lymphocytes Relative 12 %   Lymphs Abs 1.1 0.7 - 4.0 K/uL   Monocytes Relative 11 %   Monocytes Absolute 1.0 0.1 - 1.0 K/uL   Eosinophils  Relative 1 %   Eosinophils Absolute 0.1 0.0 - 0.5 K/uL   Basophils Relative 0 %   Basophils Absolute 0.0 0.0 - 0.1 K/uL   Immature Granulocytes 0 %   Abs Immature Granulocytes 0.03 0.00 - 0.07 K/uL    Comment: Performed at [facility], [address], [place], [place] [address]  Basic metabolic panel     Status: Abnormal   Collection Time: 01/06/23  2:23 PM  Result Value Ref Range   Sodium 141 135 - 145 mmol/L   Potassium 3.4 (L) 3.5 - 5.1 mmol/L   Chloride 105 98 - 111 mmol/L   CO2 25 22 - 32 mmol/L   Glucose, Bld 157 (H) 70 - 99 mg/dL    Comment: Glucose reference range applies only to samples taken after fasting for at least 8 hours.   BUN 9 6 - 20 mg/dL   Creatinine, Ser 8.41 (H) 0.44 - 1.00 mg/dL   Calcium 8.4 (L) 8.9 - 10.3 mg/dL   GFR, Estimated >32 >44 mL/min    Comment: (NOTE) Calculated using the CKD-EPI Creatinine Equation (2021)    Anion gap 11 5 - 15    Comment: Performed at [facility], [address] [place], [place] [address]  Urinalysis, Routine w reflex microscopic -Urine, Clean Catch     Status: Abnormal   Collection Time: 01/06/23  5:58 PM  Result Value Ref Range   Color, Urine YELLOW YELLOW   APPearance HAZY (A) CLEAR   Specific Gravity, Urine 1.011 1.005 - 1.030   pH 6.0 5.0 - 8.0   Glucose, UA NEGATIVE NEGATIVE mg/dL   Hgb urine dipstick SMALL (A) NEGATIVE   Bilirubin Urine NEGATIVE NEGATIVE   Ketones, ur NEGATIVE NEGATIVE mg/dL   Protein, ur 30 (A) NEGATIVE mg/dL   Nitrite POSITIVE (A) NEGATIVE   Leukocytes,Ua LARGE (A) NEGATIVE   RBC / HPF 11-20 0 - 5 RBC/hpf   WBC, UA >50 0 - 5 WBC/hpf   Bacteria, UA MANY (A) NONE SEEN   Squamous Epithelial / HPF 0-5 0 - 5 /HPF   Mucus PRESENT    Budding Yeast PRESENT     Comment: Performed at [facility], [address], [place], [place] [address]  Resp panel by RT-PCR (RSV, Flu A&B, Covid) Anterior Nasal Swab     Status: None   Collection Time: 01/06/23 11:35 PM   Specimen: Anterior Nasal Swab  Result  Value Ref Range   SARS Coronavirus 2 by RT PCR NEGATIVE NEGATIVE    Comment: (NOTE) SARS-CoV-2 target nucleic acids are NOT DETECTED.  The SARS-CoV-2 RNA is generally detectable in upper  respiratory specimens during the acute phase of infection. The lowest concentration of SARS-CoV-2 viral copies this assay can detect is 138 copies/mL. A negative result does not preclude SARS-Cov-2 infection and should not be used as the sole basis for treatment or other patient management decisions. A negative result may occur with  improper specimen collection/handling, submission of specimen other than nasopharyngeal swab, presence of viral mutation(s) within the areas targeted by this assay, and inadequate number of viral copies(<138 copies/mL). A negative result must be combined with clinical observations, patient history, and epidemiological information. The expected result is Negative.  Fact Sheet for Patients:  BloggerCourse.com  Fact Sheet for Healthcare Providers:  SeriousBroker.it  This test is no t yet approved or cleared by the [place] FDA and  has been authorized for detection and/or diagnosis of SARS-CoV-2 by FDA under an Emergency Use Authorization (EUA). This EUA will remain  in effect (meaning this test can be used) for the duration of the COVID-19 declaration under Section 564(b)(1) of the Act, 21 U.S.C.section 360bbb-3(b)(1), unless the authorization is terminated  or revoked sooner.       Influenza A by PCR NEGATIVE NEGATIVE   Influenza B by PCR NEGATIVE NEGATIVE    Comment: (NOTE) The Xpert Xpress SARS-CoV-2/FLU/RSV plus assay is intended as an aid in the diagnosis of influenza from Nasopharyngeal swab specimens and should not be used as a sole basis for treatment. Nasal washings and aspirates are unacceptable for Xpert Xpress SARS-CoV-2/FLU/RSV testing.  Fact Sheet for  Patients: BloggerCourse.com  Fact Sheet for Healthcare Providers: SeriousBroker.it  This test is not yet approved or cleared by the [place] FDA and has been authorized for detection and/or diagnosis of SARS-CoV-2 by FDA under an Emergency Use Authorization (EUA). This EUA will remain in effect (meaning this test can be used) for the duration of the COVID-19 declaration under Section 564(b)(1) of the Act, 21 U.S.C. section 360bbb-3(b)(1), unless the authorization is terminated or revoked.     Resp Syncytial Virus by PCR NEGATIVE NEGATIVE    Comment: (NOTE) Fact Sheet for Patients: BloggerCourse.com  Fact Sheet for Healthcare Providers: SeriousBroker.it  This test is not yet approved or cleared by the [place] FDA and has been authorized for detection and/or diagnosis of SARS-CoV-2 by FDA under an Emergency Use Authorization (EUA). This EUA will remain in effect (meaning this test can be used) for the duration of the COVID-19 declaration under Section 564(b)(1) of the Act, 21 U.S.C. section 360bbb-3(b)(1), unless the authorization is terminated or revoked.  Performed at [facility], [address], [place], [place] [address]    DG Chest 2 View  Result Date: 01/06/2023 CLINICAL DATA:  Shortness of breath for 2 days EXAM: CHEST - 2 VIEW COMPARISON:  03/25/2021 FINDINGS: Low volume AP examination. The heart size and mediastinal contours are within normal limits. Mild diffuse interstitial opacity. Disc degenerative disease of the thoracic spine. IMPRESSION: Low volume AP examination with mild diffuse interstitial opacity, suggestive of edema or atypical/viral infection, appearance possibly exaggerated by low volume technique and bronchovascular crowding. No focal airspace opacity. Electronically Signed   By: Jearld Lesch M.D.   On: 01/06/2023 14:59    Pending Labs Unresulted  Labs (From admission, onward)     Start     Ordered   01/06/23 2020  Urine Culture  Once,   URGENT       Question:  Indication  Answer:  Dysuria   01/06/23 2020  Vitals/Pain Today's Vitals   01/07/23 0000 01/07/23 0015 01/07/23 0045 01/07/23 0100  BP: 130/85 (!) 156/92 (!) 153/116 (!) 157/106  Pulse: 99 (!) 102 (!) 103 96  Resp: (!) 29 (!) 24 15 (!) 32  Temp:      TempSrc:      SpO2: 94% 98% 96% 98%  PainSc:        Isolation Precautions No active isolations  Medications Medications  methylPREDNISolone sodium succinate (SOLU-MEDROL) 40 mg/mL injection 40 mg (has no administration in time range)  sodium chloride 0.9 % bolus 1,000 mL (0 mLs Intravenous Stopped 01/06/23 2247)  cefTRIAXone (ROCEPHIN) 2 g in sodium chloride 0.9 % 100 mL IVPB (0 g Intravenous Stopped 01/06/23 2242)  HYDROmorphone (DILAUDID) injection 0.5 mg (0.5 mg Intravenous Given 01/06/23 2054)  azithromycin (ZITHROMAX) 500 mg in sodium chloride 0.9 % 250 mL IVPB (0 mg Intravenous Stopped 01/07/23 0030)    Mobility walks with person assist     Focused Assessments Cardiac Assessment Handoff:    Lab Results  Component Value Date   CKTOTAL 145 03/20/2015   TROPONINI <0.03 04/18/2018   Lab Results  Component Value Date   DDIMER 0.48 02/09/2020   Does the Patient currently have chest pain? No    R Recommendations: See Admitting Provider Note  Report given to:   Additional Notes:

## 2023-01-07 NOTE — Progress Notes (Signed)
Transition of Care Department [facility]) has reviewed patient and no other TOC needs have been identified at this time. We will continue to monitor patient advancement through interdisciplinary progression rounds. If new patient transition needs arise, please place a TOC consult.   01/07/23 0741  TOC Brief Assessment  Insurance and Status Reviewed  Patient has primary care physician Yes  Home environment has been reviewed Lives alone.  Prior level of function: Independent.  Prior/Current Home Services No current home services  Social Determinants of Health Reivew SDOH reviewed no interventions necessary  Readmission risk has been reviewed Yes  Transition of care needs no transition of care needs at this time

## 2023-01-07 NOTE — Progress Notes (Signed)
Patient states that she needs to go home and let her 7 animals out. MD Gwenlyn Perking notified about patient wanting to leave AMA. MD Gwenlyn Perking came into the patient's room and went over the reason patient needs to stay. Patient still is wanting to leave AMA. AMA paperwork filled out and IV taken out.

## 2023-01-07 NOTE — Evaluation (Signed)
Physical Therapy Evaluation Patient Details Name: Kristi Martin MRN: [id number] DOB: 06-27-1970 Today's Date: 01/07/2023  History of Present Illness  a 52 y.o. female with medical history significant of asthma, anxiety, depression, peripheral neuropathy, sciatica who presents to the emergency department accompanied by mother due to 2-day onset of worsening low back pain associated with urinary incontinence (which was unusual for patient).  She also complained of several days of fever and chills, 3 days of cough.  Patient recently found her best friend dead and this was a disheartening to her.  She was able to ambulate and has no loss of sensation around her groin.  She denies nausea, vomiting, abdominal pain.  Clinical Impression   Pt tolerated today's Physical Therapy Evaluation, well with good return for functional mobility with ambulation into hallway without AD. Pt demonstrating balance deficits when ambulating and turning due to RLE muscle weakness. Pt demonstrating abnormal findings with significant RLE muscle weakness and light touch sensation changes comparative to Left side as base/normal side. Pt shows mild deficits in ambulation and balance, pt would benefit from skilled interventions regarding use of cane for RLE weakness and will address next PT session. Pt lives alone. Pending MRI results, would recommend Outpatient Physical Therapy referral.. Based upon these deficits/impairments, patient will benefit from continued skilled physical therapy services during remainder of hospital stay and at the next recommended venue of care to address deficits and promote return to optimal function.                If plan is discharge home, recommend the following: A little help with bathing/dressing/bathroom   Can travel by private vehicle        Equipment Recommendations Cane  Recommendations for Other Services       Functional Status Assessment Patient has had a recent decline in  their functional status and demonstrates the ability to make significant improvements in function in a reasonable and predictable amount of time.     Precautions / Restrictions Precautions Precautions: None Restrictions Weight Bearing Restrictions: No      Mobility  Bed Mobility               General bed mobility comments: Seated in recliner    Transfers Overall transfer level: Modified independent Equipment used: None               General transfer comment: Sit/stand from recliner with powerup with BUE on handrail    Ambulation/Gait Ambulation/Gait assistance: Supervision Gait Distance (Feet): 50 Feet Assistive device: None Gait Pattern/deviations: Step-through pattern, Decreased step length - right, Decreased stride length, Decreased weight shift to right       General Gait Details: antalgic gait pattern with RLE, unsteady when pivoting toward R side.  Stairs            Wheelchair Mobility     Tilt Bed    Modified Rankin (Stroke Patients Only)       Balance Overall balance assessment: Mild deficits observed, not formally tested                                           Pertinent Vitals/Pain Pain Assessment Pain Assessment: 0-10 Pain Score: 7  Pain Location: Low back Pain Descriptors / Indicators: Pressure Pain Intervention(s): Monitored during session    Home Living Family/patient expects to be discharged to:: Private residence Living Arrangements: Spouse/significant other  Type of Home: Apartment Home Access: Stairs to enter   Entrance Stairs-Number of Steps: 3   Home Layout: One level Home Equipment: Cane - single point      Prior Function Prior Level of Function : Independent/Modified Independent             Mobility Comments: Independent and drives ADLs Comments: Independent     Extremity/Trunk Assessment   Upper Extremity Assessment Upper Extremity Assessment: Overall WFL for tasks  assessed    Lower Extremity Assessment Lower Extremity Assessment: RLE deficits/detail;LLE deficits/detail RLE Deficits / Details: 3+/5 MMT knee extension and ankle dorsiflexion RLE Sensation: decreased light touch LLE Deficits / Details: 4/5 MMT knee extension and ankle dorsiflexion LLE Sensation: WNL       Communication   Communication Communication: No apparent difficulties  Cognition Arousal: Alert Behavior During Therapy: WFL for tasks assessed/performed Overall Cognitive Status: Within Functional Limits for tasks assessed                                          General Comments      Exercises     Assessment/Plan    PT Assessment Patient needs continued PT services  PT Problem List Decreased strength;Decreased mobility       PT Treatment Interventions DME instruction;Stair training;Functional mobility training;Therapeutic activities;Therapeutic exercise;Balance training;Neuromuscular re-education;Gait training    PT Goals (Current goals can be found in the Care Plan section)  Acute Rehab PT Goals Patient Stated Goal: go home PT Goal Formulation: With patient Time For Goal Achievement: 01/21/23 Potential to Achieve Goals: Good    Frequency Min 2X/week     Co-evaluation               AM-PAC PT "6 Clicks" Mobility  Outcome Measure Help needed turning from your back to your side while in a flat bed without using bedrails?: None Help needed moving from lying on your back to sitting on the side of a flat bed without using bedrails?: None Help needed moving to and from a bed to a chair (including a wheelchair)?: A Little Help needed standing up from a chair using your arms (e.g., wheelchair or bedside chair)?: A Little Help needed to walk in hospital room?: A Little Help needed climbing 3-5 steps with a railing? : A Little 6 Click Score: 20    End of Session Equipment Utilized During Treatment: Gait belt Activity Tolerance: Patient  tolerated treatment well;No increased pain Patient left: in chair;with chair alarm set;with call bell/phone within reach;with family/visitor present Nurse Communication: Mobility status PT Visit Diagnosis: Unsteadiness on feet (R26.81);Muscle weakness (generalized) (M62.81)    Time: 9562-1308 PT Time Calculation (min) (ACUTE ONLY): 15 min   Charges:   PT Evaluation $PT Eval Low Complexity: 1 Low   PT General Charges $$ ACUTE PT VISIT: 1 Visit         Nelida Meuse PT, DPT Physical Therapist with [facility] [phone] office  Nelida Meuse 01/07/2023, 11:41 AM

## 2023-01-07 NOTE — H&P (Signed)
History and Physical    Patient: Kristi Martin:[id number] DOB: [birth year] DOA: 01/06/2023 DOS: the patient was seen and examined on 01/07/2023 PCP: Benetta Spar, MD  Patient coming from: Home  Chief Complaint:  Chief Complaint  Patient presents with   Pain   HPI: Kristi Martin is a 52 y.o. female with medical history significant of asthma, anxiety, depression, peripheral neuropathy, sciatica who presents to the emergency department accompanied by mother due to 2-day onset of worsening low back pain associated with urinary incontinence (which was unusual for patient).  She also complained of several days of fever and chills, 3 days of cough.  Patient recently found her best friend dead and this was a disheartening to her.  She was able to ambulate and has no loss of sensation around her groin.  She denies nausea, vomiting, abdominal pain.  ED Course:  In the emergency department, she was tachypneic and tachycardic, temperature was 100.3 F, O2 sat was 98% on room air and BP was 113/94.  Workup in the ED showed normal CBC and BMP except for potassium of 3.4 and blood glucose of 157.  Creatinine 1.06.  Urinalysis was positive for UTI.  Influenza A, B, SARS 1 was 2, RSV was negative. Chest x-ray was suggestive of edema or atypical/viral infection. Patient was treated with IV ceftriaxone and azithromycin.  IV Solu-Medrol x 1 was given due to low back pain pending MRI lumbar spine. Hospitalist was asked to admit patient for further evaluation and management.   Review of Systems: Review of systems as noted in the HPI. All other systems reviewed and are negative.   Past Medical History:  Diagnosis Date   Anemia    Anxiety    Asthma    as child   Cancer (HCC)    cervical cx at age 89   Cervical cancer (HCC)    Chronic back pain    Chronic kidney disease    CKD (chronic kidney disease), symptom management only, stage 3 (moderate) (HCC)    DDD (degenerative disc  disease), lumbar    Facet arthritis   GERD (gastroesophageal reflux disease)    Hypotension    Left knee pain    Migraine    Peripheral neuropathy    Sciatica    Substance abuse (HCC)    Vaginal Pap smear, abnormal    Past Surgical History:  Procedure Laterality Date   CERVICAL CONIZATION W/BX N/A 04/01/2017   Procedure: LASER CONIZATION CERVIX WITH BIOPSY;  Surgeon: Lazaro Arms, MD;  Location: [facility];  Service: Gynecology;  Laterality: N/A;   CHOLECYSTECTOMY N/A 01/20/2013   Procedure: LAPAROSCOPIC CHOLECYSTECTOMY WITH INTRAOPERATIVE CHOLANGIOGRAM;  Surgeon: Kandis Cocking, MD;  Location: [facility];  Service: General;  Laterality: N/A;   DILATATION AND CURETTAGE/HYSTEROSCOPY WITH MINERVA N/A 11/10/2018   Procedure: DILATATION AND CURETTAGE/HYSTEROSCOPY WITH MINERVA;  Surgeon: Lazaro Arms, MD;  Location: [facility];  Service: Gynecology;  Laterality: N/A;   TUBAL LIGATION      Social History:  reports that she has never smoked. She has never used smokeless tobacco. She reports current alcohol use. She reports that she does not currently use drugs after having used the following drugs: "Crack" cocaine.   Allergies  Allergen Reactions   Benadryl [Diphenhydramine Hcl] Anaphylaxis, Swelling and Other (See Comments)    Back of throat closes   Onion Anaphylaxis    Family History  Problem Relation Age of Onset   Cancer Mother    Colon cancer  Mother    Breast cancer Maternal Grandmother    Heart disease Maternal Grandmother    Cancer Maternal Grandmother    Breast cancer Paternal Grandmother    Heart disease Paternal Grandmother    Hypertension Paternal Grandmother    Alcohol abuse Father    Drug abuse Father    Heart disease Father    Asthma Brother    Cancer Paternal Grandfather      Prior to Admission medications   Medication Sig Start Date End Date Taking? Authorizing Provider  albuterol (VENTOLIN HFA) 108 (90 Base) MCG/ACT inhaler Inhale 1-2 puffs into the lungs every 6  (six) hours as needed for wheezing or shortness of breath. 02/09/20  Yes Erick Blinks, MD  AMITRIPTYLINE HCL PO Take 1 tablet by mouth daily.   Yes [provider]  Ascorbic Acid (VITAMIN C PO) Take 1 tablet by mouth daily.   Yes [provider]  Calcium Carbonate Antacid (TUMS E-X PO) Take 1 tablet by mouth daily.   Yes [provider]  Citalopram Hydrobromide (CELEXA PO) Take 1 tablet by mouth daily.   Yes [provider]  Ferrous Sulfate (IRON PO) Take 1 tablet by mouth daily.   Yes [provider]  HYDROcodone-acetaminophen (NORCO/VICODIN) 5-325 MG tablet One tablet by mouth every six hours as needed for pain.  Seven day limit Patient taking differently: Take 1 tablet by mouth every 6 (six) hours as needed for moderate pain (pain score 4-6). One tablet by mouth every six hours as needed for pain.  Seven day limit 11/13/22  Yes Darreld Mclean, MD  Multiple Vitamin (MULTIVITAMIN WITH MINERALS) TABS tablet Take 1 tablet by mouth daily. WOMEN'S ONE A DAY   Yes [provider]  ondansetron (ZOFRAN ODT) 4 MG disintegrating tablet Take 1 tablet (4 mg total) by mouth every 8 (eight) hours as needed. 04/02/20  Yes Jacquelin Hawking, PA-C  Probiotic Product (PROBIOTIC PO) Take 1 tablet by mouth daily.   Yes [provider]  sertraline (ZOLOFT) 25 MG tablet Take 25 mg by mouth daily. 08/21/22  Yes [provider]  Vitamin D, Ergocalciferol, (DRISDOL) 1.25 MG (50000 UNIT) CAPS capsule Take 50,000 Units by mouth once a week. 08/17/22  Yes [provider]    Physical Exam: BP 131/79 (BP Location: Right Wrist)   Pulse 94   Temp 98.5 F (36.9 C) (Oral)   Resp 20   Ht 5\' 5"  (1.651 m)   Wt 121.6 kg   SpO2 95%   BMI 44.61 kg/m   General: 52 y.o. year-old female well developed well nourished in no acute distress.  Alert and oriented x3. HEENT: NCAT, EOMI Neck: Supple, trachea medial Cardiovascular: Tachycardia.  Regular rate  and rhythm with no rubs or gallops.  No thyromegaly or JVD noted.  No lower extremity edema. 2/4 pulses in all 4 extremities. Respiratory: Tachypnea.  Clear to auscultation with no wheezes or rales. Good inspiratory effort. Abdomen: Soft, nontender nondistended with normal bowel sounds x4 quadrants. Muskuloskeletal: Tender to palpation of lumbar area.  No no cyanosis, clubbing or edema noted bilaterally Neuro: CN II-XII intact, strength 5/5 x 4, sensation, reflexes intact Skin: No ulcerative lesions noted or rashes Psychiatry: Judgement and insight appear normal. Mood is appropriate for condition and setting          Labs on Admission:  Basic Metabolic Panel: Recent Labs  Lab 01/06/23 1423 01/07/23 0852  NA 141 139  K 3.4* 4.0  CL 105 108  CO2 25  22  GLUCOSE 157* 165*  BUN 9 9  CREATININE 1.06* 0.92  CALCIUM 8.4* 8.6*  MG  --  2.3  PHOS  --  2.6   Liver Function Tests: Recent Labs  Lab 01/07/23 0852  AST 20  ALT 24  ALKPHOS 69  BILITOT 0.3  PROT 7.0  ALBUMIN 3.0*   No results for input(s): "LIPASE", "AMYLASE" in the last 168 hours. No results for input(s): "AMMONIA" in the last 168 hours. CBC: Recent Labs  Lab 01/06/23 1423 01/07/23 0852  WBC 8.8 5.9  NEUTROABS 6.7  --   HGB 13.6 13.6  HCT 42.7 42.5  MCV 90.9 90.2  PLT 192 207   Cardiac Enzymes: No results for input(s): "CKTOTAL", "CKMB", "CKMBINDEX", "TROPONINI" in the last 168 hours.  BNP (last 3 results) No results for input(s): "BNP" in the last 8760 hours.  ProBNP (last 3 results) No results for input(s): "PROBNP" in the last 8760 hours.  CBG: No results for input(s): "GLUCAP" in the last 168 hours.  Radiological Exams on Admission: DG Chest 2 View  Result Date: 01/06/2023 CLINICAL DATA:  Shortness of breath for 2 days EXAM: CHEST - 2 VIEW COMPARISON:  03/25/2021 FINDINGS: Low volume AP examination. The heart size and mediastinal contours are within normal limits. Mild diffuse interstitial  opacity. Disc degenerative disease of the thoracic spine. IMPRESSION: Low volume AP examination with mild diffuse interstitial opacity, suggestive of edema or atypical/viral infection, appearance possibly exaggerated by low volume technique and bronchovascular crowding. No focal airspace opacity. Electronically Signed   By: Jearld Lesch M.D.   On: 01/06/2023 14:59    EKG: I independently viewed the EKG done and my findings are as followed: Sinus tachycardia at a rate of 110 bpm   Assessment/Plan Present on Admission:  Sepsis due to pneumonia [facility])  MDD (major depressive disorder)  Principal Problem:   Sepsis due to pneumonia [facility]) Active Problems:   Peripheral neuropathy   MDD (major depressive disorder)   Obesity, Class III, BMI 40-49.9 (morbid obesity) (HCC)   UTI (urinary tract infection)   Hypokalemia   Low back pain   Urinary incontinence   Hyperglycemia  Sepsis due to pneumonia POA Patient met sepsis criteria due to CAP and tachycardia, tachypnea with source of infection being pneumonia and/or UTI. Patient was started on ceftriaxone and azithromycin, we shall continue same at this time with plan to de-escalate/discontinue based on blood culture, sputum culture, urine Legionella, strep pneumo and procalcitonin Continue Tylenol as needed Continue Mucinex, incentive spirometry, flutter valve  Blood culture pending  UTI POA Continue IV ceftriaxone Urine culture pending  Low back pain Peripheral neuropathy This may be due to patient's history of sciatica, she appears to have a history of low back pain due to taking Norco at home. IV Solu-Medrol was given pending outcome of MRI of lumbar region Continue oxycodone as needed Continue fall precaution Consult PT/OT eval and treat  Hypokalemia K+ 3.4, this will be replenished  Hyperglycemia possibly due to prediabetes Hemoglobin A1c in March 2024 was 6.1 Patient was not on any diabetic meds. Continue diet and lifestyle  modification  Urinary incontinence Continue oxybutynin  Depression Continue Zoloft Patient recently found her best friend diet and was disheartened, but denies any suicidal thoughts or ideas  Obesity class III (BMI 44.61) Diet and lifestyle modification Patient may need to follow-up with PCP for weight loss program   DVT prophylaxis: Lovenox  Code Status: Full code  Family Communication: Mother at bedside (all questions  answered to satisfaction)  Consults: None  Severity of Illness: The appropriate patient status for this patient is INPATIENT. Inpatient status is judged to be reasonable and necessary in order to provide the required intensity of service to ensure the patient's safety. The patient's presenting symptoms, physical exam findings, and initial radiographic and laboratory data in the context of their chronic comorbidities is felt to place them at high risk for further clinical deterioration. Furthermore, it is not anticipated that the patient will be medically stable for discharge from the hospital within 2 midnights of admission.   * I certify that at the point of admission it is my clinical judgment that the patient will require inpatient hospital care spanning beyond 2 midnights from the point of admission due to high intensity of service, high risk for further deterioration and high frequency of surveillance required.*  Author: Frankey Shown, DO 01/07/2023 9:31 AM  For on call review [email or web address].

## 2023-01-07 NOTE — Plan of Care (Signed)
  Problem: Acute Rehab PT Goals(only PT should resolve) Goal: Patient Will Transfer Sit To/From Stand Flowsheets (Taken 01/07/2023 1145) Patient will transfer sit to/from stand: Independently Goal: Pt Will Ambulate Flowsheets (Taken 01/07/2023 1145) Pt will Ambulate:  100 feet  > 125 feet  with least restrictive assistive device  with modified independence  Nelida Meuse PT, DPT Physical Therapist with [facility] [phone] office
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
## 2023-01-07 NOTE — ED Notes (Signed)
Purewick applied.

## 2023-01-07 NOTE — Progress Notes (Signed)
Mobility Specialist Progress Note:    01/07/23 1000  Mobility  Activity Transferred from bed to chair  Level of Assistance Independent  Assistive Device None  Distance Ambulated (ft) 4 ft  Range of Motion/Exercises Active;All extremities  Activity Response Tolerated well  Mobility Referral Yes  $Mobility charge 1 Mobility  Mobility Specialist Start Time (ACUTE ONLY) 0945  Mobility Specialist Stop Time (ACUTE ONLY) 1000  Mobility Specialist Time Calculation (min) (ACUTE ONLY) 15 min   Pt received in bed, mother in room. Agreeable to mobility, independently able to stand and transfer to chair. Tolerated well, asx throughout. Left in chair, NT in room. All needs met.   Lawerance Bach Mobility Specialist Please contact via Special educational needs teacher or  Rehab office at [phone]

## 2023-01-07 NOTE — ED Notes (Signed)
Pt has had 2 episodes of urine incontinence. Cleaned and changed

## 2023-01-07 NOTE — Plan of Care (Signed)

## 2023-01-09 LAB — URINE CULTURE: Culture: >=100000 — AB

## 2023-01-12 LAB — CULTURE, BLOOD (ROUTINE X 2)
Culture: NO GROWTH
Culture: NO GROWTH
Special Requests: ADEQUATE
Special Requests: ADEQUATE

## 2023-03-27 ENCOUNTER — Emergency Department (HOSPITAL_COMMUNITY): Payer: 59

## 2023-03-27 ENCOUNTER — Encounter (HOSPITAL_COMMUNITY): Payer: Self-pay | Admitting: Emergency Medicine

## 2023-03-27 ENCOUNTER — Emergency Department (HOSPITAL_COMMUNITY)
Admission: EM | Admit: 2023-03-27 | Discharge: 2023-03-28 | Discharge status: Against Medical Advice | Payer: 59 | Attending: Emergency Medicine | Admitting: Emergency Medicine

## 2023-03-27 DIAGNOSIS — R531 Weakness: Secondary | ICD-10-CM | POA: Diagnosis not present

## 2023-03-27 DIAGNOSIS — Z5321 Procedure and treatment not carried out due to patient leaving prior to being seen by health care provider: Secondary | ICD-10-CM | POA: Diagnosis not present

## 2023-03-27 DIAGNOSIS — R112 Nausea with vomiting, unspecified: Secondary | ICD-10-CM | POA: Insufficient documentation

## 2023-03-27 DIAGNOSIS — M791 Myalgia, unspecified site: Secondary | ICD-10-CM | POA: Diagnosis present

## 2023-03-27 LAB — COMPREHENSIVE METABOLIC PANEL
ALT: 15 U/L (ref 0–44)
AST: 17 U/L (ref 15–41)
Albumin: 3.9 g/dL (ref 3.5–5.0)
Alkaline Phosphatase: 70 U/L (ref 38–126)
Anion gap: 10 (ref 5–15)
BUN: 11 mg/dL (ref 6–20)
CO2: 24 mmol/L (ref 22–32)
Calcium: 8.9 mg/dL (ref 8.9–10.3)
Chloride: 105 mmol/L (ref 98–111)
Creatinine, Ser: 0.97 mg/dL (ref 0.44–1.00)
GFR, Estimated: >60 mL/min (ref >60)
Glucose, Bld: 111 mg/dL — ABNORMAL HIGH (ref 70–99)
Potassium: 3.4 mmol/L — ABNORMAL LOW (ref 3.5–5.1)
Sodium: 139 mmol/L (ref 135–145)
Total Bilirubin: 0.7 mg/dL (ref 0.0–1.2)
Total Protein: 7.7 g/dL (ref 6.5–8.1)

## 2023-03-27 LAB — RESP PANEL BY RT-PCR (RSV, FLU A&B, COVID)  RVPGX2
Influenza A by PCR: NEGATIVE
Influenza B by PCR: NEGATIVE
Resp Syncytial Virus by PCR: NEGATIVE
SARS Coronavirus 2 by RT PCR: NEGATIVE

## 2023-03-27 LAB — CBC
HCT: 47.5 % — ABNORMAL HIGH (ref 36.0–46.0)
Hemoglobin: 15.9 g/dL — ABNORMAL HIGH (ref 12.0–15.0)
MCH: 29.3 pg (ref 26.0–34.0)
MCHC: 33.5 g/dL (ref 30.0–36.0)
MCV: 87.5 fL (ref 80.0–100.0)
Platelets: 212 10*3/uL (ref 150–400)
RBC: 5.43 MIL/uL — ABNORMAL HIGH (ref 3.87–5.11)
RDW: 13.9 % (ref 11.5–15.5)
WBC: 4.2 10*3/uL (ref 4.0–10.5)
nRBC: 0 % (ref 0.0–0.2)

## 2023-03-27 NOTE — ED Triage Notes (Signed)
Pt Bib [facility] with reports of body aches, n/v, and weakness that started last night.

## 2023-04-04 ENCOUNTER — Emergency Department (HOSPITAL_COMMUNITY)
Admission: EM | Admit: 2023-04-04 | Discharge: 2023-04-05 | Disposition: A | Discharge status: Home / Self Care | Attending: Emergency Medicine | Admitting: Emergency Medicine

## 2023-04-04 DIAGNOSIS — Z79899 Other long term (current) drug therapy: Secondary | ICD-10-CM | POA: Insufficient documentation

## 2023-04-04 DIAGNOSIS — T50904A Poisoning by unspecified drugs, medicaments and biological substances, undetermined, initial encounter: Secondary | ICD-10-CM | POA: Diagnosis not present

## 2023-04-04 DIAGNOSIS — R5383 Other fatigue: Secondary | ICD-10-CM | POA: Diagnosis present

## 2023-04-05 ENCOUNTER — Emergency Department (HOSPITAL_COMMUNITY)

## 2023-04-05 ENCOUNTER — Other Ambulatory Visit: Payer: Self-pay

## 2023-04-05 LAB — I-STAT CHEM 8, ED
BUN: 18 mg/dL (ref 6–20)
Calcium, Ion: 1.08 mmol/L — ABNORMAL LOW (ref 1.15–1.40)
Chloride: 106 mmol/L (ref 98–111)
Creatinine, Ser: 1 mg/dL (ref 0.44–1.00)
Glucose, Bld: 106 mg/dL — ABNORMAL HIGH (ref 70–99)
HCT: 40 % (ref 36.0–46.0)
Hemoglobin: 13.6 g/dL (ref 12.0–15.0)
Potassium: 3.6 mmol/L (ref 3.5–5.1)
Sodium: 142 mmol/L (ref 135–145)
TCO2: 27 mmol/L (ref 22–32)

## 2023-04-05 LAB — COMPREHENSIVE METABOLIC PANEL
ALT: 20 U/L (ref 0–44)
AST: 18 U/L (ref 15–41)
Albumin: 3.5 g/dL (ref 3.5–5.0)
Alkaline Phosphatase: 56 U/L (ref 38–126)
Anion gap: 7 (ref 5–15)
BUN: 17 mg/dL (ref 6–20)
CO2: 25 mmol/L (ref 22–32)
Calcium: 8.4 mg/dL — ABNORMAL LOW (ref 8.9–10.3)
Chloride: 106 mmol/L (ref 98–111)
Creatinine, Ser: 0.91 mg/dL (ref 0.44–1.00)
GFR, Estimated: >60 mL/min (ref >60)
Glucose, Bld: 110 mg/dL — ABNORMAL HIGH (ref 70–99)
Potassium: 3.3 mmol/L — ABNORMAL LOW (ref 3.5–5.1)
Sodium: 138 mmol/L (ref 135–145)
Total Bilirubin: 0.5 mg/dL (ref 0.0–1.2)
Total Protein: 6.8 g/dL (ref 6.5–8.1)

## 2023-04-05 LAB — CBC WITH DIFFERENTIAL/PLATELET
Abs Immature Granulocytes: 0.02 10*3/uL (ref 0.00–0.07)
Basophils Absolute: 0 10*3/uL (ref 0.0–0.1)
Basophils Relative: 1 %
Eosinophils Absolute: 0.1 10*3/uL (ref 0.0–0.5)
Eosinophils Relative: 1 %
HCT: 41.4 % (ref 36.0–46.0)
Hemoglobin: 13.5 g/dL (ref 12.0–15.0)
Immature Granulocytes: 0 %
Lymphocytes Relative: 32 %
Lymphs Abs: 1.6 10*3/uL (ref 0.7–4.0)
MCH: 28.9 pg (ref 26.0–34.0)
MCHC: 32.6 g/dL (ref 30.0–36.0)
MCV: 88.7 fL (ref 80.0–100.0)
Monocytes Absolute: 0.5 10*3/uL (ref 0.1–1.0)
Monocytes Relative: 9 %
Neutro Abs: 2.8 10*3/uL (ref 1.7–7.7)
Neutrophils Relative %: 57 %
Platelets: 237 10*3/uL (ref 150–400)
RBC: 4.67 MIL/uL (ref 3.87–5.11)
RDW: 13.9 % (ref 11.5–15.5)
WBC: 5 10*3/uL (ref 4.0–10.5)
nRBC: 0 % (ref 0.0–0.2)

## 2023-04-05 LAB — BLOOD GAS, VENOUS
Acid-Base Excess: 4.5 mmol/L — ABNORMAL HIGH (ref 0.0–2.0)
Bicarbonate: 30.4 mmol/L — ABNORMAL HIGH (ref 20.0–28.0)
Drawn by: 70279
O2 Saturation: 75.3 %
Patient temperature: 36.7
pCO2, Ven: 48 mmHg (ref 44–60)
pH, Ven: 7.4 (ref 7.25–7.43)
pO2, Ven: 43 mmHg (ref 32–45)

## 2023-04-05 LAB — URINALYSIS, ROUTINE W REFLEX MICROSCOPIC
Bacteria, UA: NONE SEEN
Bilirubin Urine: NEGATIVE
Glucose, UA: NEGATIVE mg/dL
Ketones, ur: NEGATIVE mg/dL
Leukocytes,Ua: NEGATIVE
Nitrite: NEGATIVE
Protein, ur: NEGATIVE mg/dL
Specific Gravity, Urine: 1.02 (ref 1.005–1.030)
pH: 6 (ref 5.0–8.0)

## 2023-04-05 LAB — POC URINE PREG, ED: Preg Test, Ur: NEGATIVE

## 2023-04-05 LAB — RAPID URINE DRUG SCREEN, HOSP PERFORMED
Amphetamines: NOT DETECTED
Barbiturates: NOT DETECTED
Benzodiazepines: NOT DETECTED
Cocaine: POSITIVE — AB
Opiates: NOT DETECTED
Tetrahydrocannabinol: NOT DETECTED

## 2023-04-05 LAB — TROPONIN I (HIGH SENSITIVITY): Troponin I (High Sensitivity): 3 ng/L (ref <18)

## 2023-04-05 LAB — CBG MONITORING, ED: Glucose-Capillary: 107 mg/dL — ABNORMAL HIGH (ref 70–99)

## 2023-04-05 LAB — MAGNESIUM: Magnesium: 2.2 mg/dL (ref 1.7–2.4)

## 2023-04-05 LAB — BETA-HYDROXYBUTYRIC ACID: Beta-Hydroxybutyric Acid: 0.12 mmol/L (ref 0.05–0.27)

## 2023-04-05 MED ORDER — NALOXONE HCL 0.4 MG/ML IJ SOLN
0.4000 mg | Freq: Once | INTRAMUSCULAR | Status: AC
  Administered 2023-04-05: 0.4 mg via INTRAVENOUS
  Filled 2023-04-05: qty 1

## 2023-04-05 MED ORDER — NALOXONE HCL 4 MG/0.1ML NA LIQD: NASAL | 1 refills | Status: AC

## 2023-04-05 MED ORDER — NALOXONE HCL 0.4 MG/ML IJ SOLN: 0.2000 mg | INTRAMUSCULAR | Status: DC | PRN

## 2023-04-05 MED ORDER — LACTATED RINGERS IV BOLUS
1000.0000 mL | Freq: Once | INTRAVENOUS | Status: AC
  Administered 2023-04-05: 1000 mL via INTRAVENOUS

## 2023-04-05 NOTE — ED Notes (Signed)
 Pt able to eat cracker and hold water in hand. Pt communicated she wanted the water.
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
## 2023-06-03 ENCOUNTER — Other Ambulatory Visit (HOSPITAL_COMMUNITY)
Admission: RE | Admit: 2023-06-03 | Discharge: 2023-06-03 | Disposition: A | Discharge status: Home / Self Care | Payer: MEDICAID | Source: Ambulatory Visit | Attending: Internal Medicine | Admitting: Internal Medicine

## 2023-06-03 DIAGNOSIS — Z0001 Encounter for general adult medical examination with abnormal findings: Secondary | ICD-10-CM | POA: Insufficient documentation

## 2023-06-03 DIAGNOSIS — E559 Vitamin D deficiency, unspecified: Secondary | ICD-10-CM | POA: Diagnosis not present

## 2023-06-03 DIAGNOSIS — E039 Hypothyroidism, unspecified: Secondary | ICD-10-CM | POA: Insufficient documentation

## 2023-06-03 DIAGNOSIS — N183 Chronic kidney disease, stage 3 unspecified: Secondary | ICD-10-CM | POA: Diagnosis not present

## 2023-06-03 LAB — CBC WITH DIFFERENTIAL/PLATELET
Abs Immature Granulocytes: 0.02 10*3/uL (ref 0.00–0.07)
Basophils Absolute: 0 10*3/uL (ref 0.0–0.1)
Basophils Relative: 1 %
Eosinophils Absolute: 0.1 10*3/uL (ref 0.0–0.5)
Eosinophils Relative: 2 %
HCT: 42.7 % (ref 36.0–46.0)
Hemoglobin: 13.7 g/dL (ref 12.0–15.0)
Immature Granulocytes: 0 %
Lymphocytes Relative: 28 %
Lymphs Abs: 1.5 10*3/uL (ref 0.7–4.0)
MCH: 28.7 pg (ref 26.0–34.0)
MCHC: 32.1 g/dL (ref 30.0–36.0)
MCV: 89.3 fL (ref 80.0–100.0)
Monocytes Absolute: 0.5 10*3/uL (ref 0.1–1.0)
Monocytes Relative: 10 %
Neutro Abs: 3.1 10*3/uL (ref 1.7–7.7)
Neutrophils Relative %: 59 %
Platelets: 246 10*3/uL (ref 150–400)
RBC: 4.78 MIL/uL (ref 3.87–5.11)
RDW: 14.3 % (ref 11.5–15.5)
WBC: 5.3 10*3/uL (ref 4.0–10.5)
nRBC: 0 % (ref 0.0–0.2)

## 2023-06-03 LAB — LIPID PANEL
Cholesterol: 159 mg/dL (ref 0–200)
HDL: 43 mg/dL (ref >40)
LDL Cholesterol: 98 mg/dL (ref 0–99)
Total CHOL/HDL Ratio: 3.7 ratio
Triglycerides: 89 mg/dL (ref <150)
VLDL: 18 mg/dL (ref 0–40)

## 2023-06-03 LAB — HEPATIC FUNCTION PANEL
ALT: 12 U/L (ref 0–44)
AST: 13 U/L — ABNORMAL LOW (ref 15–41)
Albumin: 3.3 g/dL — ABNORMAL LOW (ref 3.5–5.0)
Alkaline Phosphatase: 67 U/L (ref 38–126)
Bilirubin, Direct: <0.1 mg/dL (ref 0.0–0.2)
Total Bilirubin: 0.4 mg/dL (ref 0.0–1.2)
Total Protein: 6.6 g/dL (ref 6.5–8.1)

## 2023-06-03 LAB — BASIC METABOLIC PANEL WITH GFR
Anion gap: 9 (ref 5–15)
BUN: 14 mg/dL (ref 6–20)
CO2: 23 mmol/L (ref 22–32)
Calcium: 8.5 mg/dL — ABNORMAL LOW (ref 8.9–10.3)
Chloride: 107 mmol/L (ref 98–111)
Creatinine, Ser: 0.99 mg/dL (ref 0.44–1.00)
GFR, Estimated: >60 mL/min (ref >60)
Glucose, Bld: 106 mg/dL — ABNORMAL HIGH (ref 70–99)
Potassium: 4 mmol/L (ref 3.5–5.1)
Sodium: 139 mmol/L (ref 135–145)

## 2023-06-03 LAB — VITAMIN D 25 HYDROXY (VIT D DEFICIENCY, FRACTURES): Vit D, 25-Hydroxy: 31.58 ng/mL (ref 30–100)

## 2023-06-03 LAB — T4, FREE: Free T4: 0.7 ng/dL (ref 0.61–1.12)

## 2023-06-03 LAB — TSH: TSH: 2.151 u[IU]/mL (ref 0.350–4.500)

## 2023-06-05 ENCOUNTER — Other Ambulatory Visit (HOSPITAL_COMMUNITY): Payer: Self-pay | Admitting: Internal Medicine

## 2023-06-05 DIAGNOSIS — Z1231 Encounter for screening mammogram for malignant neoplasm of breast: Secondary | ICD-10-CM

## 2023-07-15 ENCOUNTER — Ambulatory Visit (HOSPITAL_COMMUNITY)
